# Patient Record
Sex: Female | Born: 1960 | Race: White | Hispanic: No | Marital: Married | Smoking: Former smoker
Health system: Southern US, Community
[De-identification: ages and names within clinical notes are randomized; demographics above are authoritative.]

## PROBLEM LIST (undated history)

## (undated) DIAGNOSIS — M1990 Unspecified osteoarthritis, unspecified site: Secondary | ICD-10-CM

## (undated) DIAGNOSIS — J302 Other seasonal allergic rhinitis: Secondary | ICD-10-CM

## (undated) DIAGNOSIS — J069 Acute upper respiratory infection, unspecified: Secondary | ICD-10-CM

## (undated) DIAGNOSIS — G8929 Other chronic pain: Secondary | ICD-10-CM

## (undated) DIAGNOSIS — E785 Hyperlipidemia, unspecified: Secondary | ICD-10-CM

## (undated) DIAGNOSIS — B019 Varicella without complication: Secondary | ICD-10-CM

## (undated) DIAGNOSIS — C519 Malignant neoplasm of vulva, unspecified: Secondary | ICD-10-CM

## (undated) DIAGNOSIS — I10 Essential (primary) hypertension: Secondary | ICD-10-CM

## (undated) DIAGNOSIS — Z923 Personal history of irradiation: Secondary | ICD-10-CM

## (undated) HISTORY — PX: CHOLECYSTECTOMY: SHX55

## (undated) HISTORY — PX: BLADDER SUSPENSION: SHX72

## (undated) HISTORY — DX: Varicella without complication: B01.9

## (undated) HISTORY — PX: COLONOSCOPY: SHX174

## (undated) HISTORY — PX: WISDOM TOOTH EXTRACTION: SHX21

## (undated) HISTORY — DX: Acute upper respiratory infection, unspecified: J06.9

## (undated) HISTORY — DX: Unspecified osteoarthritis, unspecified site: M19.90

---

## 2012-06-09 LAB — HM COLONOSCOPY

## 2014-12-22 LAB — HM MAMMOGRAPHY

## 2015-02-18 LAB — HEPATIC FUNCTION PANEL
ALK PHOS: 68 (ref 25–125)
ALT: 36 — AB (ref 7–35)
AST: 26 (ref 13–35)

## 2015-02-18 LAB — BASIC METABOLIC PANEL
BUN: 13 (ref 4–21)
CREATININE: 0.7 (ref 0.5–1.1)
Glucose: 95
Potassium: 4.3 (ref 3.4–5.3)
Sodium: 142 (ref 137–147)

## 2015-02-18 LAB — CBC AND DIFFERENTIAL
HEMATOCRIT: 41 (ref 36–46)
HEMOGLOBIN: 13.5 (ref 12.0–16.0)
PLATELETS: 354 (ref 150–399)
WBC: 10.2

## 2016-10-29 ENCOUNTER — Encounter: Payer: Self-pay | Admitting: Family Medicine

## 2016-10-29 ENCOUNTER — Ambulatory Visit (INDEPENDENT_AMBULATORY_CARE_PROVIDER_SITE_OTHER): Payer: 59 | Admitting: Family Medicine

## 2016-10-29 VITALS — BP 122/80 | HR 85 | Resp 12 | Ht 62.01 in | Wt 140.2 lb

## 2016-10-29 DIAGNOSIS — J309 Allergic rhinitis, unspecified: Secondary | ICD-10-CM

## 2016-10-29 DIAGNOSIS — L259 Unspecified contact dermatitis, unspecified cause: Secondary | ICD-10-CM | POA: Diagnosis not present

## 2016-10-29 DIAGNOSIS — I10 Essential (primary) hypertension: Secondary | ICD-10-CM | POA: Diagnosis not present

## 2016-10-29 MED ORDER — CLOBETASOL PROPIONATE 0.05 % EX CREA: 1.0000 "application " | TOPICAL_CREAM | Freq: Two times a day (BID) | CUTANEOUS | 0 refills | Status: AC

## 2016-10-29 MED ORDER — LOSARTAN POTASSIUM 50 MG PO TABS: 50.0000 mg | ORAL_TABLET | Freq: Every day | ORAL | 2 refills | Status: DC

## 2016-10-29 MED ORDER — FAMOTIDINE 20 MG PO TABS: 20.0000 mg | ORAL_TABLET | Freq: Two times a day (BID) | ORAL | 0 refills | Status: DC

## 2016-10-29 MED ORDER — METHYLPREDNISOLONE ACETATE 80 MG/ML IJ SUSP
40.0000 mg | Freq: Once | INTRAMUSCULAR | Status: AC
  Administered 2016-10-29: 40 mg via INTRAMUSCULAR

## 2016-10-29 NOTE — Patient Instructions (Addendum)
A few things to remember from today's visit:   Contact dermatitis, unspecified contact dermatitis type, unspecified trigger  Hypertension, essential, benign  Allergic rhinitis, unspecified seasonality, unspecified trigger   DASH Eating Plan DASH stands for "Dietary Approaches to Stop Hypertension." The DASH eating plan is a healthy eating plan that has been shown to reduce high blood pressure (hypertension). It may also reduce your risk for type 2 diabetes, heart disease, and stroke. The DASH eating plan may also help with weight loss. What are tips for following this plan? General guidelines  Avoid eating more than 2,300 mg (milligrams) of salt (sodium) a day. If you have hypertension, you may need to reduce your sodium intake to 1,500 mg a day.  Limit alcohol intake to no more than 1 drink a day for nonpregnant women and 2 drinks a day for men. One drink equals 12 oz of beer, 5 oz of wine, or 1 oz of hard liquor.  Work with your health care provider to maintain a healthy body weight or to lose weight. Ask what an ideal weight is for you.  Get at least 30 minutes of exercise that causes your heart to beat faster (aerobic exercise) most days of the week. Activities may include walking, swimming, or biking.  Work with your health care provider or diet and nutrition specialist (dietitian) to adjust your eating plan to your individual calorie needs. Reading food labels  Check food labels for the amount of sodium per serving. Choose foods with less than 5 percent of the Daily Value of sodium. Generally, foods with less than 300 mg of sodium per serving fit into this eating plan.  To find whole grains, look for the word "whole" as the first word in the ingredient list. Shopping  Buy products labeled as "low-sodium" or "no salt added."  Buy fresh foods. Avoid canned foods and premade or frozen meals. Cooking  Avoid adding salt when cooking. Use salt-free seasonings or herbs instead of  table salt or sea salt. Check with your health care provider or pharmacist before using salt substitutes.  Do not fry foods. Cook foods using healthy methods such as baking, boiling, grilling, and broiling instead.  Cook with heart-healthy oils, such as olive, canola, soybean, or sunflower oil. Meal planning   Eat a balanced diet that includes: ? 5 or more servings of fruits and vegetables each day. At each meal, try to fill half of your plate with fruits and vegetables. ? Up to 6-8 servings of whole grains each day. ? Less than 6 oz of lean meat, poultry, or fish each day. A 3-oz serving of meat is about the same size as a deck of cards. One egg equals 1 oz. ? 2 servings of low-fat dairy each day. ? A serving of nuts, seeds, or beans 5 times each week. ? Heart-healthy fats. Healthy fats called Omega-3 fatty acids are found in foods such as flaxseeds and coldwater fish, like sardines, salmon, and mackerel.  Limit how much you eat of the following: ? Canned or prepackaged foods. ? Food that is high in trans fat, such as fried foods. ? Food that is high in saturated fat, such as fatty meat. ? Sweets, desserts, sugary drinks, and other foods with added sugar. ? Full-fat dairy products.  Do not salt foods before eating.  Try to eat at least 2 vegetarian meals each week.  Eat more home-cooked food and less restaurant, buffet, and fast food.  When eating at a restaurant, ask that  your food be prepared with less salt or no salt, if possible. What foods are recommended? The items listed may not be a complete list. Talk with your dietitian about what dietary choices are best for you. Grains Whole-grain or whole-wheat bread. Whole-grain or whole-wheat pasta. Brown rice. [place]. Bulgur. Whole-grain and low-sodium cereals. Pita bread. Low-fat, low-sodium crackers. Whole-wheat flour tortillas. Vegetables Fresh or frozen vegetables (raw, steamed, roasted, or grilled). Low-sodium or  reduced-sodium tomato and vegetable juice. Low-sodium or reduced-sodium tomato sauce and tomato paste. Low-sodium or reduced-sodium canned vegetables. Fruits All fresh, dried, or frozen fruit. Canned fruit in natural juice (without added sugar). Meat and other protein foods Skinless chicken or [place]. Ground chicken or [place]. Pork with fat trimmed off. Fish and seafood. Egg whites. Dried beans, peas, or lentils. Unsalted nuts, nut butters, and seeds. Unsalted canned beans. Lean cuts of beef with fat trimmed off. Low-sodium, lean deli meat. Dairy Low-fat (1%) or fat-free (skim) milk. Fat-free, low-fat, or reduced-fat cheeses. Nonfat, low-sodium ricotta or cottage cheese. Low-fat or nonfat yogurt. Low-fat, low-sodium cheese. Fats and oils Soft margarine without trans fats. Vegetable oil. Low-fat, reduced-fat, or light mayonnaise and salad dressings (reduced-sodium). Canola, safflower, olive, soybean, and sunflower oils. Avocado. Seasoning and other foods Herbs. Spices. Seasoning mixes without salt. Unsalted popcorn and pretzels. Fat-free sweets. What foods are not recommended? The items listed may not be a complete list. Talk with your dietitian about what dietary choices are best for you. Grains Baked goods made with fat, such as croissants, muffins, or some breads. Dry pasta or rice meal packs. Vegetables Creamed or fried vegetables. Vegetables in a cheese sauce. Regular canned vegetables (not low-sodium or reduced-sodium). Regular canned tomato sauce and paste (not low-sodium or reduced-sodium). Regular tomato and vegetable juice (not low-sodium or reduced-sodium). Angie Fava. Olives. Fruits Canned fruit in a light or heavy syrup. Fried fruit. Fruit in cream or butter sauce. Meat and other protein foods Fatty cuts of meat. Ribs. Fried meat. Berniece Salines. Sausage. Bologna and other processed lunch meats. Salami. Fatback. Hotdogs. Bratwurst. Salted nuts and seeds. Canned beans with added salt. Canned or  smoked fish. Whole eggs or egg yolks. Chicken or [place] with skin. Dairy Whole or 2% milk, cream, and half-and-half. Whole or full-fat cream cheese. Whole-fat or sweetened yogurt. Full-fat cheese. Nondairy creamers. Whipped toppings. Processed cheese and cheese spreads. Fats and oils Butter. Stick margarine. Lard. Shortening. Ghee. Bacon fat. Tropical oils, such as coconut, palm kernel, or palm oil. Seasoning and other foods Salted popcorn and pretzels. Onion salt, garlic salt, seasoned salt, table salt, and sea salt. Worcestershire sauce. Tartar sauce. Barbecue sauce. Teriyaki sauce. Soy sauce, including reduced-sodium. Steak sauce. Canned and packaged gravies. Fish sauce. Oyster sauce. Cocktail sauce. Horseradish that you find on the shelf. Ketchup. Mustard. Meat flavorings and tenderizers. Bouillon cubes. Hot sauce and Tabasco sauce. Premade or packaged marinades. Premade or packaged taco seasonings. Relishes. Regular salad dressings. Where to find more information:  National Heart, Lung, and [place]: https://wilson-eaton.com/  American Heart Association: www.heart.org Summary  The DASH eating plan is a healthy eating plan that has been shown to reduce high blood pressure (hypertension). It may also reduce your risk for type 2 diabetes, heart disease, and stroke.  With the DASH eating plan, you should limit salt (sodium) intake to 2,300 mg a day. If you have hypertension, you may need to reduce your sodium intake to 1,500 mg a day.  When on the DASH eating plan, aim to eat more fresh fruits and  vegetables, whole grains, lean proteins, low-fat dairy, and heart-healthy fats.  Work with your health care provider or diet and nutrition specialist (dietitian) to adjust your eating plan to your individual calorie needs. This information is not intended to replace advice given to you by your health care provider. Make sure you discuss any questions you have with your health care provider. Document  Released: 01/24/2011 Document Revised: 01/29/2016 Document Reviewed: 01/29/2016 Elsevier Interactive Patient Education  2017 [employer]. Please be sure medication list is accurate. If a new problem present, please set up appointment sooner than planned today.

## 2016-10-29 NOTE — Progress Notes (Signed)
HPI:   Christina [name] is a 56 y.o. female, who is here today to establish care.  Former PCP: N/A,she just moved to the area 2 months ago from [place] Last preventive routine visit: Over a year ago.  Chronic medical problems: Allergic rhinitis, OA,vit D deficiency, right plantar fasciitis among some.  She lives with her husband.  She reports Hx of sinus infections, improved over the years,now she gets about one per year. Currently she is on OTC Allegra and Flonase nasal spray prn.  Allergy symptoms are well controlled at this time but she is afraid they will be bad with seasonal changes.   HTN   Dx in her 67's. Denies severe/frequent headache, visual changes, chest pain, dyspnea, palpitation, claudication, focal weakness, or edema. She is reporting lab work done within the past 2-3 months. She is currently on Cozaar 50 mg daily. She has tried to discontinue medication a few times but BP goes up again.  She follows low-salt diet and exercises regularly.  Concerns today:   Pruritic rash she noted about 2 weeks ago a day after she was working on her yard. Started on her face and now it is around her waist. She used topical steroid cream from college roomate and facial rash resolved. She left and she did not get the name of the topical cream.  She has OTC "itchy kits" but has not helped.  She is not aware of any specific exposure, she is allergic to poison sumac. She has not noted vesicular lesions.  Denies new medication,sick contact, or insect bites. No cough,wheezing,dyspnea,or chest pain.   Review of Systems  Constitutional: Negative for activity change, appetite change, fatigue and fever.  HENT: Positive for congestion, postnasal drip and rhinorrhea. Negative for ear pain, mouth sores, nosebleeds, sinus pain, sneezing, sore throat, trouble swallowing and voice change.   Eyes: Negative for discharge, redness, itching and visual disturbance.  Respiratory: Negative for  cough, shortness of breath and wheezing.   Cardiovascular: Negative for chest pain, palpitations and leg swelling.  Gastrointestinal: Negative for abdominal pain, diarrhea, nausea and vomiting.  Genitourinary: Negative for decreased urine volume and hematuria.  Musculoskeletal: Positive for arthralgias. Negative for gait problem and myalgias.  Skin: Positive for rash. Negative for wound.  Allergic/Immunologic: Positive for environmental allergies.  Neurological: Negative for syncope, weakness and headaches.  Hematological: Negative for adenopathy. Does not bruise/bleed easily.  Psychiatric/Behavioral: Negative for confusion. The patient is nervous/anxious.       No current outpatient prescriptions on file prior to visit.   No current facility-administered medications on file prior to visit.      Past Medical History:  Diagnosis Date  . Arthritis   . Chicken pox    No Known Allergies  Family History  Problem Relation Age of Onset  . Cancer Mother   . Hyperlipidemia Mother   . Hypertension Mother   . Stroke Mother   . Cancer Father   . Hypertension Maternal Grandmother   . Hypertension Paternal Grandmother   . Stroke Paternal Grandmother     Social History   Social History  . Marital status: Married    Spouse name: N/A  . Number of children: N/A  . Years of education: N/A   Social History Main Topics  . Smoking status: Former Research scientist (life sciences)  . Smokeless tobacco: Never Used  . Alcohol use Yes  . Drug use: No  . Sexual activity: Yes    Partners: Male   Other Topics Concern  .  None   Social History Narrative  . None    Vitals:   10/29/16 0759  BP: 122/80  Pulse: 85  Resp: 12  SpO2: 98%    Body mass index is 25.65 kg/m.    Physical Exam  Nursing note and vitals reviewed. Constitutional: She is oriented to person, place, and time. She appears well-developed and well-nourished. No distress.  HENT:  Head: Normocephalic and atraumatic.  Right Ear:  Hearing, tympanic membrane, external ear and ear canal normal.  Left Ear: Hearing, tympanic membrane, external ear and ear canal normal.  Nose: Septal deviation present. Right sinus exhibits no maxillary sinus tenderness and no frontal sinus tenderness. Left sinus exhibits no maxillary sinus tenderness and no frontal sinus tenderness.  Mouth/Throat: Oropharynx is clear and moist and mucous membranes are normal.  Hypertrophic turbinates.  Eyes: Pupils are equal, round, and reactive to light. Conjunctivae and EOM are normal.  Cardiovascular: Normal rate and regular rhythm.   No murmur heard. Pulses:      Dorsalis pedis pulses are 2+ on the right side, and 2+ on the left side.  Respiratory: Effort normal and breath sounds normal. No respiratory distress.  GI: Soft. She exhibits no mass. There is no hepatomegaly. There is no tenderness.  Musculoskeletal: She exhibits no edema or tenderness.  Lymphadenopathy:    She has no cervical adenopathy.  Neurological: She is alert and oriented to person, place, and time. She has normal strength. Coordination and gait normal.  Skin: Skin is warm. Ecchymosis and rash noted. Rash is urticarial. Rash is not vesicular. No erythema.     Raised erythematous rash around waist and lower abdomen, 0.6 to 1.3 cm. No tender or local heat. On some area lineal ecchymosis, scratching marks.  Psychiatric: Her mood appears anxious.  Well groomed, good eye contact.     ASSESSMENT AND PLAN:   [name] was seen today for establish care.  Diagnoses and all orders for this visit:  Contact dermatitis, unspecified contact dermatitis type, unspecified trigger  Possible etiologies discussed, distribution and characteristics of rash is not typical for poison sumac. Treatment options discussed, she would like injection here in the office, Depo Medrol 40 mg IM. Continue with topical steroid, small amount and for up to 14 days. Pepcid 20 mg bid. Instructed about warning  signs and f/u as needed.  -     clobetasol cream (TEMOVATE) 0.05 %; Apply 1 application topically 2 (two) times daily. 14 days -     famotidine (PEPCID) 20 MG tablet; Take 1 tablet (20 mg total) by mouth 2 (two) times daily. -     methylPREDNISolone acetate (DEPO-MEDROL) injection 40 mg; Inject 0.5 mLs (40 mg total) into the muscle once.  Hypertension, essential, benign  Adequately controlled. No changes in current management. DASH diet recommended. Eye exam recommended annually. F/U in 6 months, before if needed.  -     losartan (COZAAR) 50 MG tablet; Take 1 tablet (50 mg total) by mouth daily.  Allergic rhinitis, unspecified seasonality, unspecified trigger  Educated about Dx. Continue OTC Allegra 180 mg daily and Flonase nasal spray. Nasal irrigations with saline and steam inhalations may help to prevent bacterial sinusitis. F/U as needed.  -     methylPREDNISolone acetate (DEPO-MEDROL) injection 40 mg; Inject 0.5 mLs (40 mg total) into the muscle once.      Keith Cancio G. Martinique, MD  [facility]. [place] office.

## 2016-11-14 ENCOUNTER — Encounter: Payer: Self-pay | Admitting: Family Medicine

## 2016-11-15 ENCOUNTER — Encounter: Payer: Self-pay | Admitting: Family Medicine

## 2016-11-21 ENCOUNTER — Encounter: Payer: Self-pay | Admitting: Family Medicine

## 2016-11-22 ENCOUNTER — Ambulatory Visit (INDEPENDENT_AMBULATORY_CARE_PROVIDER_SITE_OTHER): Payer: 59

## 2016-11-22 DIAGNOSIS — Z23 Encounter for immunization: Secondary | ICD-10-CM | POA: Diagnosis not present

## 2016-11-24 NOTE — Progress Notes (Signed)
ACUTE VISIT   HPI:  Chief Complaint  Patient presents with  . Vaginal Discharge    [name] is a 56 y.o. female, who is here today complaining of vaginal discharge.   She is c/o 1-2 weeks of non odorous, whitish vaginal discharge.  She is reporting history of recurrent bacterial vaginosis and yeast infections in the past, former PCP treated with Metronidazole or Diflucan when she had similar symptoms in the past.   Hx of STD: Denies. Husband with Hx of hepatitis B but states that she has been screened. Sexually active: Yes Vaginal pruritus: Yes Dyspareunia: Deneis Vaginal spotting: Denies  She has not noted associated fever,chills, abdominal pain, nausea,vomiting,urinary symptoms, arthralgias, or skin rash.   She has tried OTC Monistat but has not helped.  DJS:HFWY menopausal. Last pap smear 1-2 years ago, denies Hx of abnormal pap smears.   Review of Systems  Constitutional: Negative for activity change, appetite change, fatigue and fever.  HENT: Negative for mouth sores and sore throat.   Eyes: Negative for discharge and redness.  Gastrointestinal: Negative for abdominal pain, nausea and vomiting.  Genitourinary: Positive for vaginal discharge. Negative for decreased urine volume, dyspareunia, dysuria, frequency, hematuria and vaginal bleeding.  Musculoskeletal: Negative for arthralgias, joint swelling and myalgias.  Skin: Negative for rash and wound.  Hematological: Negative for adenopathy. Does not bruise/bleed easily.      Current Outpatient Prescriptions on File Prior to Visit  Medication Sig Dispense Refill  . Cholecalciferol (VITAMIN D PO) Take 1 tablet by mouth daily.    Marland Kitchen losartan (COZAAR) 50 MG tablet Take 1 tablet (50 mg total) by mouth daily. 90 tablet 2  . Probiotic Product (PROBIOTIC PO) Take 1 tablet by mouth daily as needed.     No current facility-administered medications on file prior to visit.      Past Medical History:    Diagnosis Date  . Arthritis   . Chicken pox    No Known Allergies  Social History   Social History  . Marital status: Married    Spouse name: N/A  . Number of children: N/A  . Years of education: N/A   Social History Main Topics  . Smoking status: Former Research scientist (life sciences)  . Smokeless tobacco: Never Used  . Alcohol use Yes  . Drug use: No  . Sexual activity: Yes    Partners: Male   Other Topics Concern  . None   Social History Narrative  . None    Vitals:   11/25/16 1448  BP: 110/70  Pulse: 74  Resp: 12  SpO2: 98%   Body mass index is 26.22 kg/m.   Physical Exam  Nursing note and vitals reviewed. Constitutional: She is oriented to person, place, and time. She appears well-developed. No distress.  HENT:  Head: Normocephalic and atraumatic.  Eyes: Conjunctivae are normal.  Cardiovascular: Normal rate and regular rhythm.   Respiratory: Effort normal. No respiratory distress.  GI: Soft. She exhibits no mass. There is no tenderness. There is no CVA tenderness.  Genitourinary: There is no tenderness or lesion on the right labia. There is no tenderness or lesion on the left labia. Cervix exhibits discharge (clear.). Cervix exhibits no motion tenderness and no friability. No erythema, tenderness or bleeding in the vagina.  Genitourinary Comments: Superficial linear excoriations on vaginal introitus, related to scratching. Sample collected.   Musculoskeletal: She exhibits no edema or tenderness.  Lymphadenopathy:       Right: No inguinal adenopathy present.  Left: No inguinal adenopathy present.  Neurological: She is alert and oriented to person, place, and time. She has normal strength. Gait normal.  Skin: Skin is warm. No rash noted. No erythema.  Psychiatric: Her mood appears anxious.  Well groomed, good eye conatct.    ASSESSMENT AND PLAN:   [name] was seen today for vaginal discharge.  Diagnoses and all orders for this visit:  Vaginal discharge -     [place], YEAST, [place]; Future -     GC/Chlamydia Probe Amp  Bacterial vaginitis -     metroNIDAZOLE (FLAGYL) 500 MG tablet; Take 1 tablet (500 mg total) by mouth 2 (two) times daily.   Examination today does not suggest a serious process.  We discussed possible etiologies of vaginal discharge and pruritus.    Empiric treatment for BV started, since she has had prior similar symptoms that resolved after treatment. Side effects of Metronidazole discussed. Further recommendations will be given according to lab results.     -[name] was advised to seek immediate medical attention if sudden worsening symptoms or to follow if they persist or if new concerns arise.       [name], MD  [facility] [place] office.

## 2016-11-25 ENCOUNTER — Ambulatory Visit (INDEPENDENT_AMBULATORY_CARE_PROVIDER_SITE_OTHER): Payer: 59 | Admitting: Family Medicine

## 2016-11-25 ENCOUNTER — Encounter: Payer: Self-pay | Admitting: Family Medicine

## 2016-11-25 VITALS — BP 110/70 | HR 74 | Resp 12 | Ht 62.01 in | Wt 143.4 lb

## 2016-11-25 DIAGNOSIS — N760 Acute vaginitis: Secondary | ICD-10-CM

## 2016-11-25 DIAGNOSIS — N898 Other specified noninflammatory disorders of vagina: Secondary | ICD-10-CM

## 2016-11-25 DIAGNOSIS — B9689 Other specified bacterial agents as the cause of diseases classified elsewhere: Secondary | ICD-10-CM

## 2016-11-25 MED ORDER — METRONIDAZOLE 500 MG PO TABS: 500.0000 mg | ORAL_TABLET | Freq: Two times a day (BID) | ORAL | 0 refills | Status: AC

## 2016-11-25 NOTE — Patient Instructions (Signed)
A few things to remember from today's visit:   Vaginal discharge  Bacterial vaginitis - Plan: metroNIDAZOLE (FLAGYL) 500 MG tablet   Vaginitis Vaginitis is an inflammation of the vagina. It can happen when the normal bacteria and yeast in the vagina grow too much. There are different types. Treatment will depend on the type you have. Follow these instructions at home:  Take all medicines as told by your doctor.  Keep your vagina area clean and dry. Avoid soap. Rinse the area with water.  Avoid washing and cleaning out the vagina (douching).  Do not use tampons or have sex (intercourse) until your treatment is done.  Wipe from front to back after going to the restroom.  Wear cotton underwear.  Avoid wearing underwear while you sleep until your vaginitis is gone.  Avoid tight pants. Avoid underwear or nylons without a cotton panel.  Take off wet clothing (such as a bathing suit) as soon as you can.  Use mild, unscented products. Avoid fabric softeners and scented: ? Feminine sprays. ? Laundry detergents. ? Tampons. ? Soaps or bubble baths.  Practice safe sex and use condoms. Get help right away if:  You have belly (abdominal) pain.  You have a fever or lasting symptoms for more than 2-3 days.  You have a fever and your symptoms suddenly get worse. This information is not intended to replace advice given to you by your health care provider. Make sure you discuss any questions you have with your health care provider. Document Released: 05/03/2008 Document Revised: 07/13/2015 Document Reviewed: 07/18/2011 Elsevier Interactive Patient Education  2017 [place].  Please be sure medication list is accurate. If a new problem present, please set up appointment sooner than planned today.

## 2016-11-27 ENCOUNTER — Encounter: Payer: Self-pay | Admitting: Family Medicine

## 2016-11-27 LAB — WET PREP FOR TRICH, YEAST, CLUE

## 2016-11-27 LAB — GC/CHLAMYDIA PROBE AMP
Chlamydia trachomatis, NAA: NEGATIVE
Neisseria gonorrhoeae by PCR: NEGATIVE

## 2016-11-27 LAB — SPECIMEN STATUS REPORT

## 2016-11-27 LAB — VAGINITIS/VAGINOSIS, DNA PROBE
CANDIDA SPECIES: NEGATIVE
GARDNERELLA VAGINALIS: NEGATIVE
TRICHOMONAS VAG: NEGATIVE

## 2016-12-13 ENCOUNTER — Encounter: Payer: Self-pay | Admitting: Family Medicine

## 2016-12-13 ENCOUNTER — Telehealth: Payer: Self-pay | Admitting: Family Medicine

## 2016-12-13 ENCOUNTER — Ambulatory Visit (INDEPENDENT_AMBULATORY_CARE_PROVIDER_SITE_OTHER): Payer: 59 | Admitting: Internal Medicine

## 2016-12-13 ENCOUNTER — Encounter: Payer: Self-pay | Admitting: Internal Medicine

## 2016-12-13 VITALS — BP 152/98 | HR 77 | Temp 97.6°F | Wt 139.8 lb

## 2016-12-13 DIAGNOSIS — Z87440 Personal history of urinary (tract) infections: Secondary | ICD-10-CM | POA: Diagnosis not present

## 2016-12-13 DIAGNOSIS — N390 Urinary tract infection, site not specified: Secondary | ICD-10-CM

## 2016-12-13 DIAGNOSIS — R300 Dysuria: Secondary | ICD-10-CM

## 2016-12-13 DIAGNOSIS — R1031 Right lower quadrant pain: Secondary | ICD-10-CM | POA: Diagnosis not present

## 2016-12-13 LAB — POCT URINALYSIS DIPSTICK
BILIRUBIN UA: NEGATIVE
Glucose, UA: NEGATIVE
KETONES UA: NEGATIVE
PROTEIN UA: NEGATIVE
SPEC GRAV UA: 1.015 (ref 1.010–1.025)
Urobilinogen, UA: 1 E.U./dL
pH, UA: 6 (ref 5.0–8.0)

## 2016-12-13 MED ORDER — SULFAMETHOXAZOLE-TRIMETHOPRIM 800-160 MG PO TABS: 1.0000 | ORAL_TABLET | Freq: Two times a day (BID) | ORAL | 0 refills | Status: DC

## 2016-12-13 NOTE — Telephone Encounter (Signed)
Ok with me 

## 2016-12-13 NOTE — Progress Notes (Signed)
Chief Complaint  Patient presents with  . Dysuria    burning with urination x 1 day. Last night patient experienced RLQ pain and nausea. Tender to touch. Notes some diarrhea 1 day ago.     HPI: Christina [name] 56 y.o.  sda  PCP NA    Onset of above dysuria for a day and then developed some right lower quadrant discomfort without associated vomiting or anorexia fever may be mild chills that are now gone. This feels like a typical UTI except for that.  Recently treated for BV with a negative testing had taken only a few days of metronidazole that may have helped her vaginal symptoms which she thinks are gone she started taking the metronidazole and Pyridium type medicine when she got these new symptoms for 2 days. Has hx of utis and   1-2 per year.   [facility]  Had urology   [place]. Doing hot yoga   And trying to tlose weight.  ROS: See pertinent positives and negatives per HPI.   Past Medical History:  Diagnosis Date  . Arthritis   . Chicken pox     Family History  Problem Relation Age of Onset  . Cancer Mother   . Hyperlipidemia Mother   . Hypertension Mother   . Stroke Mother   . Cancer Father   . Hypertension Maternal Grandmother   . Hypertension Paternal Grandmother   . Stroke Paternal Grandmother     Social History   Social History  . Marital status: Married    Spouse name: N/A  . Number of children: N/A  . Years of education: N/A   Social History Main Topics  . Smoking status: Former Research scientist (life sciences)  . Smokeless tobacco: Never Used  . Alcohol use Yes  . Drug use: No  . Sexual activity: Yes    Partners: Male   Other Topics Concern  . None   Social History Narrative  . None    Outpatient Medications Prior to Visit  Medication Sig Dispense Refill  . Cholecalciferol (VITAMIN D PO) Take 1 tablet by mouth daily.    Marland Kitchen losartan (COZAAR) 50 MG tablet Take 1 tablet (50 mg total) by mouth daily. 90 tablet 2  . Probiotic Product (PROBIOTIC PO) Take 1 tablet by mouth daily as  needed.     No facility-administered medications prior to visit.      EXAM:  BP (!) 152/98 (BP Location: Left Arm, Patient Position: Sitting, Cuff Size: Normal)   Pulse 77   Temp 97.6 F (36.4 C) (Oral)   Wt 139 lb 12.8 oz (63.4 kg)   BMI 25.56 kg/m   Body mass index is 25.56 kg/m.  GENERAL: vitals reviewed and listed above, alert, oriented, appears well hydrated and in no acute distress HEENT: atraumatic, conjunctiva  clear, no obvious abnormalities on inspection of external nose and ears  NECK: no obvious masses on inspection palpation  LUNGS: clear to auscultation bilaterally, no wheezes, rales or rhonchi, good air movement CV: HRRR, no clubbing cyanosis or  peripheral edema nl cap refill  Abdomen soft normal bowel sounds no guarding or rebound some tenderness in the deep right lower quadrant no masses.  No flank pain.  MS: moves all extremities without noticeable focal  abnormality PSYCH: pleasant and cooperative, no obvious depression or anxiety UA is positive for blood white cells and nitrites ASSESSMENT AND PLAN:  Discussed the following assessment and plan:  Dysuria - Plan: POC Urinalysis Dipstick, Urine Culture  Acute UTI - Plan:  Urine Culture  History of recurrent UTIs  Abdominal discomfort in right lower quadrant  -Patient advised to return or notify health care team  if symptoms worsen ,persist or new concerns arise.   Expectant management. Seek care for alarm sx over weekend No need to take metronidazole at this time [facility]  Records in record  Initial review   200 pages  didn't find urology notes  Yet.  Patient Instructions  This is most   Likely   a uti  Alone    doubt if the  abd pain is  From appendicitis and  .....  If getting worse then contact  Medical team .         Standley Brooking. Panosh M.D.

## 2016-12-13 NOTE — Telephone Encounter (Signed)
Pt states Dr Regis Bill has agreed to allow her to transfer from Dr Martinique after her visit with her today. Is that ok with you Dr Regis Bill?  [place] with you Dr Martinique?

## 2016-12-13 NOTE — Patient Instructions (Addendum)
This is most   Likely   a uti  Alone    doubt if the  abd pain is  From appendicitis and  .....  If getting worse then contact  Medical team .

## 2016-12-14 LAB — URINE CULTURE
MICRO NUMBER: 81202665
SPECIMEN QUALITY:: ADEQUATE

## 2016-12-15 NOTE — Telephone Encounter (Signed)
Fine with me

## 2016-12-16 NOTE — Progress Notes (Signed)
Tell pt urine culture showed multiple bacteria but not predominant germ. Cannot tell if she has  UTI. No predominant  Bacteria  Identifired      Take antibiotic    medication and if ongoing get a better  Clean catch for culture

## 2016-12-17 NOTE — Telephone Encounter (Signed)
Left message on VM to call back

## 2016-12-17 NOTE — Telephone Encounter (Signed)
Pt aware ok to switch to Dr Regis Bill. Will call back to make an appt

## 2016-12-18 ENCOUNTER — Encounter: Payer: Self-pay | Admitting: Internal Medicine

## 2016-12-18 NOTE — Telephone Encounter (Signed)
Discussed results via telephone. Nothing further needed.

## 2017-01-20 ENCOUNTER — Encounter: Payer: Self-pay | Admitting: Internal Medicine

## 2017-01-21 ENCOUNTER — Encounter: Payer: Self-pay | Admitting: Internal Medicine

## 2017-01-21 ENCOUNTER — Ambulatory Visit: Payer: 59 | Admitting: Internal Medicine

## 2017-01-21 ENCOUNTER — Telehealth: Payer: Self-pay | Admitting: Family Medicine

## 2017-01-21 VITALS — BP 106/78 | HR 81 | Temp 97.5°F | Wt 141.5 lb

## 2017-01-21 DIAGNOSIS — R32 Unspecified urinary incontinence: Secondary | ICD-10-CM | POA: Diagnosis not present

## 2017-01-21 DIAGNOSIS — R300 Dysuria: Secondary | ICD-10-CM

## 2017-01-21 DIAGNOSIS — N390 Urinary tract infection, site not specified: Secondary | ICD-10-CM | POA: Diagnosis not present

## 2017-01-21 LAB — POCT URINALYSIS DIPSTICK
Bilirubin, UA: NEGATIVE
GLUCOSE UA: NEGATIVE
Ketones, UA: NEGATIVE
Nitrite, UA: NEGATIVE
SPEC GRAV UA: 1.02 (ref 1.010–1.025)
UROBILINOGEN UA: 0.2 U/dL
pH, UA: 6.5 (ref 5.0–8.0)

## 2017-01-21 MED ORDER — NITROFURANTOIN MONOHYD MACRO 100 MG PO CAPS: 100.0000 mg | ORAL_CAPSULE | Freq: Two times a day (BID) | ORAL | 0 refills | Status: AC

## 2017-01-21 NOTE — Telephone Encounter (Signed)
Pt called again with UTI symptoms.  Do you want to just get a urine sample/culture or have the patient schedule an OV before you go out on vacation?   I am trying to figure out when these symptoms come.It was a busy weekend. . . I was out a lot and drinking wine and had to hold it several times. When I hold it, I tend to "strain" and then 1 -2days later this happens. It feels like a UTI, however, when I have come in I think the tests show it is not. However, the drugs given to me clear it up right away? ! I am taking Azo for pain relief of the burning.  Pt has additional questions about colon screening, due for Colonoscopy -- wants to discuss.

## 2017-01-21 NOTE — Progress Notes (Signed)
Chief Complaint  Patient presents with  . Recurrent UTI    Urinary frequency, burning and feeling bad. No energy. Denies lower back pain or abd pain. Azo x 1 day.     HPI: Christina [name] 56 y.o.  PCP NA   rx for uit in October nov  But uc mult specise  Got better with antibiotic and sx again     3rd time since move.  No fever flank pain   Last episode resolved quickly with antibiotic   ROS: See pertinent positives and negatives per HPI.  Past Medical History:  Diagnosis Date  . Arthritis   . Chicken pox     Family History  Problem Relation Age of Onset  . Cancer Mother   . Hyperlipidemia Mother   . Hypertension Mother   . Stroke Mother   . Cancer Father   . Hypertension Maternal Grandmother   . Hypertension Paternal Grandmother   . Stroke Paternal Grandmother     Social History   Socioeconomic History  . Marital status: Married    Spouse name: None  . Number of children: None  . Years of education: None  . Highest education level: None  Social Needs  . Financial resource strain: None  . Food insecurity - worry: None  . Food insecurity - inability: None  . Transportation needs - medical: None  . Transportation needs - non-medical: None  Occupational History  . None  Tobacco Use  . Smoking status: Former Research scientist (life sciences)  . Smokeless tobacco: Never Used  Substance and Sexual Activity  . Alcohol use: Yes  . Drug use: No  . Sexual activity: Yes    Partners: Male  Other Topics Concern  . None  Social History Narrative  . None    Outpatient Medications Prior to Visit  Medication Sig Dispense Refill  . Cholecalciferol (VITAMIN D PO) Take 1 tablet by mouth daily.    Marland Kitchen losartan (COZAAR) 50 MG tablet Take 1 tablet (50 mg total) by mouth daily. 90 tablet 2  . metroNIDAZOLE (FLAGYL) 500 MG tablet Take 500 mg by mouth 2 (two) times daily.    . Probiotic Product (PROBIOTIC PO) Take 1 tablet by mouth daily as needed.    . Phenazopyridine HCl (AZO TABS PO) Take by mouth as  needed.    . sulfamethoxazole-trimethoprim (BACTRIM DS,SEPTRA DS) 800-160 MG tablet Take 1 tablet by mouth 2 (two) times daily. (Patient not taking: Reported on 01/21/2017) 10 tablet 0   No facility-administered medications prior to visit.      EXAM:  BP 106/78 (BP Location: Left Arm, Patient Position: Sitting, Cuff Size: Normal)   Pulse 81   Temp (!) 97.5 F (36.4 C) (Oral)   Wt 141 lb 8 oz (64.2 kg)   BMI 25.87 kg/m   Body mass index is 25.87 kg/m.  GENERAL: vitals reviewed and listed above, alert, oriented, appears well hydrated and in no acute distress HEENT: atraumatic, conjunctiva  clear, no obvious abnormalities on inspection of external nose and ears Abdomen:  Sof,t normal bowel sounds without hepatosplenomegaly, no guarding rebound or masses no CVA tenderness MS: moves all extremities without noticeable focal  abnormality PSYCH: pleasant and cooperative, no obvious depression or anxiety ua pos leuk and blood  ASSESSMENT AND PLAN:  Discussed the following assessment and plan:  Dysuria - Plan: POC Urinalysis Dipstick, Ambulatory referral to Urology, Urine Culture, CANCELED: Urine Culture, CANCELED: Urine Culture, CANCELED: Urine Culture  Recurrent UTI - Plan: Ambulatory referral to Urology  Urinary incontinence, unspecified type - Plan: Ambulatory referral to Urology probably uti again  Culture and   Disc uro referral    Jan and after  -Patient advised to return or notify health care team  if symptoms worsen ,persist or new concerns arise.  Patient Instructions  Will reculture and go back on antibiotic   .  Let us know  If not getting better .  Will put in urology referral .     Standley Brooking. Janyra Barillas M.D.

## 2017-01-21 NOTE — Patient Instructions (Addendum)
Will reculture and go back on antibiotic   .  Let us know  If not getting better .  Will put in urology referral .

## 2017-01-21 NOTE — Telephone Encounter (Signed)
Copied from [place] [id number]. Topic: Referral - Request >> Jan 21, 2017  2:08 PM Hewitt Shorts wrote: Reason for CRM: pt is wanting to know if dr Martinique is wanting to know if she needs a colonoscopy  Best number

## 2017-01-21 NOTE — Telephone Encounter (Signed)
Ov today since dr Martinique not in office

## 2017-01-21 NOTE — Telephone Encounter (Signed)
OV scheduled for 345 today with Dr Regis Bill.  Dr Martinique is not in office this week.  Nothing further needed.

## 2017-01-22 NOTE — Telephone Encounter (Signed)
Called patient and left message to return call. Per chart, she is due for colonoscopy, but need to know if she has ever had one before and then I can forward to Dr. Martinique.  Dr. Martinique is out of the office this week, but will follow-up next week.

## 2017-01-24 NOTE — Telephone Encounter (Signed)
Pt states yes she had colonoscopy around age 56 yrs, and was supposed to come back before 10 yrs.  Pt has switched to Dr Regis Bill (phone note) so can be forwarded to Dr Regis Bill. Pt aware Dr Regis Bill is out until 12/24 and ok with that.  Pt states we should have those records.

## 2017-01-24 NOTE — Telephone Encounter (Signed)
Called patient and left message to return call

## 2017-01-25 LAB — URINE CULTURE

## 2017-01-29 NOTE — Telephone Encounter (Signed)
I do not see a colon report in the 169 pages  I scanned .  But seems like had one in 2014 ?  Do not see the actual report  So cannot comment .   unless others can find the report in her PCP records   she should contact her  Previous GI and get copy of colonoscopy report . I am out of office  At this time

## 2017-01-29 NOTE — Progress Notes (Signed)
Tell patient that urine culture shows bacteria   sensitive to medication given . Should resolve with current treatment

## 2017-01-29 NOTE — Telephone Encounter (Signed)
LM for patient to call back.

## 2017-02-13 NOTE — Telephone Encounter (Signed)
LM to make patient aware of information below per Dr Regis Bill.  Mychart message sent.  This encounter has already been closed.

## 2017-02-18 ENCOUNTER — Other Ambulatory Visit: Payer: Self-pay

## 2017-02-18 ENCOUNTER — Encounter (HOSPITAL_COMMUNITY): Payer: Self-pay | Admitting: *Deleted

## 2017-02-18 ENCOUNTER — Ambulatory Visit (HOSPITAL_COMMUNITY)
Admission: EM | Admit: 2017-02-18 | Discharge: 2017-02-18 | Disposition: A | Discharge status: Home / Self Care | Payer: 59 | Attending: Family Medicine | Admitting: Family Medicine

## 2017-02-18 ENCOUNTER — Encounter: Payer: Self-pay | Admitting: Internal Medicine

## 2017-02-18 DIAGNOSIS — N3000 Acute cystitis without hematuria: Secondary | ICD-10-CM

## 2017-02-18 MED ORDER — NITROFURANTOIN MONOHYD MACRO 100 MG PO CAPS: 100.0000 mg | ORAL_CAPSULE | Freq: Two times a day (BID) | ORAL | 0 refills | Status: DC

## 2017-02-18 NOTE — ED Triage Notes (Signed)
Per pt she is having Enterococcus faecalis. Per pt she is nauseous, per pt she have to urinate a lot, per pt she just needs antibiotic

## 2017-02-19 ENCOUNTER — Encounter: Payer: Self-pay | Admitting: Internal Medicine

## 2017-02-19 NOTE — Telephone Encounter (Signed)
See open mychart encounter

## 2017-02-19 NOTE — Telephone Encounter (Signed)
Flagyl is usually for a vaginal infection   But can send in diflucan 150 mg  X 1 for possible yeast infection  asthyn please send this in if she is having a yeast infection.

## 2017-02-19 NOTE — ED Provider Notes (Signed)
  [name]    ASSESSMENT & PLAN:  1. Acute cystitis without hematuria     Meds ordered this encounter  Medications  . nitrofurantoin, macrocrystal-monohydrate, (MACROBID) 100 MG capsule    Sig: Take 1 capsule (100 mg total) by mouth 2 (two) times daily.    Dispense:  14 capsule    Refill:  0   Treatment based on urine culture she brings dated 01/29/2017. Does not desire U/A or culture tonight. Will follow up with her PCP or here if not showing improvement over the next 48 hours, sooner if needed.  Outlined signs and symptoms indicating need for more acute intervention. Patient verbalized understanding. After Visit Summary given.  SUBJECTIVE:  Christina [name] is a 57 y.o. female who complains of urinary frequency, urgency and dysuria for the past 1-2 days. No flank pain, fever, chills, abnormal vaginal discharge or bleeding. Hematuria: not present.  Normal PO intake. No flank or abdominal pain. No self treatment. Ambulatory without difficulty.  LMP: No LMP recorded. Patient is not currently having periods (Reason: Other).  ROS: As in HPI.  OBJECTIVE:  Vitals:   02/18/17 1745  BP: (!) 166/87  Pulse: 76  Temp: 97.7 F (36.5 C)  SpO2: 100%   Appears well, in no apparent distress. Abdomen is soft without tenderness, guarding, mass, rebound or organomegaly. No CVA tenderness or inguinal adenopathy noted.  No Known Allergies  Past Medical History:  Diagnosis Date  . Arthritis   . Chicken pox    Social History   Socioeconomic History  . Marital status: Married    Spouse name: Not on file  . Number of children: Not on file  . Years of education: Not on file  . Highest education level: Not on file  Social Needs  . Financial resource strain: Not on file  . Food insecurity - worry: Not on file  . Food insecurity - inability: Not on file  . Transportation needs - medical: Not on file  . Transportation needs - non-medical: Not on file  Occupational History  .  Not on file  Tobacco Use  . Smoking status: Former Research scientist (life sciences)  . Smokeless tobacco: Never Used  Substance and Sexual Activity  . Alcohol use: Yes  . Drug use: No  . Sexual activity: Yes    Partners: Male  Other Topics Concern  . Not on file  Social History Narrative  . Not on file   Family History  Problem Relation Age of Onset  . Cancer Mother   . Hyperlipidemia Mother   . Hypertension Mother   . Stroke Mother   . Cancer Father   . Hypertension Maternal Grandmother   . Hypertension Paternal Grandmother   . Stroke Paternal Izetta Dakin, MD 02/19/17 [phone]

## 2017-02-19 NOTE — Telephone Encounter (Addendum)
Please advise Dr Regis Bill, thanks.   See e-mail and message below: Dr. Regis Bill,     My symptoms are back. I just took an azo @ 3 pm on NEw Year's day. I was traveling a lot over the holidays.     I am wondering if I can get a refill on whatever I need or if I need to come in and see you today.     Christina [name]

## 2017-02-21 MED ORDER — FLUCONAZOLE 150 MG PO TABS: 150.0000 mg | ORAL_TABLET | Freq: Every day | ORAL | 0 refills | Status: DC

## 2017-03-11 ENCOUNTER — Encounter: Payer: Self-pay | Admitting: Internal Medicine

## 2017-04-03 ENCOUNTER — Encounter (HOSPITAL_COMMUNITY): Payer: Self-pay | Admitting: Emergency Medicine

## 2017-04-03 ENCOUNTER — Other Ambulatory Visit: Payer: Self-pay

## 2017-04-03 ENCOUNTER — Ambulatory Visit (HOSPITAL_COMMUNITY)
Admission: EM | Admit: 2017-04-03 | Discharge: 2017-04-03 | Disposition: A | Discharge status: Home / Self Care | Payer: 59 | Attending: Internal Medicine | Admitting: Internal Medicine

## 2017-04-03 DIAGNOSIS — J029 Acute pharyngitis, unspecified: Secondary | ICD-10-CM | POA: Insufficient documentation

## 2017-04-03 DIAGNOSIS — B349 Viral infection, unspecified: Secondary | ICD-10-CM | POA: Diagnosis not present

## 2017-04-03 LAB — POCT RAPID STREP A: Streptococcus, Group A Screen (Direct): NEGATIVE

## 2017-04-03 NOTE — ED Provider Notes (Signed)
[facility]    CSN: [id number] Arrival date & time: 04/03/17  1819     History   Chief Complaint Chief Complaint  Patient presents with  . Sore Throat    HPI Jalea Hemler is a 57 y.o. female.   57 year old female comes in for 3-day history of sore throat. States pain with swallowing. Mild rhinorrhea. Denies ear pain. Denies fever, chills, night sweats. Denies cough.  Dysphagia without swelling of the throat, shortness of breath, drooling, tripoding.  She also complains of a generalized rash that she thinks may be related to a bladder medication she started.  She does not recall the medication, but has since stopped taking it for 2-3 weeks.  Rash is still  pruritic, and continues to scratch.       Past Medical History:  Diagnosis Date  . Arthritis   . Chicken pox     Patient Active Problem List   Diagnosis Date Noted  . 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 Allergic rhinitis 10/29/2016    Past Surgical History:  Procedure Laterality Date  . CHOLECYSTECTOMY      OB History    No data available       Home Medications    Prior to Admission medications   Medication Sig Start Date End Date Taking? Authorizing Provider  losartan (COZAAR) 50 MG tablet Take 1 tablet (50 mg total) by mouth daily. 10/29/16  Yes Martinique, Betty G, MD  Cholecalciferol (VITAMIN D PO) Take 1 tablet by mouth daily.    [provider]  Probiotic Product (PROBIOTIC PO) Take 1 tablet by mouth daily as needed.    [provider]    Family History Family History  Problem Relation Age of Onset  . Cancer Mother   . Hyperlipidemia Mother   . Hypertension Mother   . Stroke Mother   . Cancer Father   . Hypertension Maternal Grandmother   . Hypertension Paternal Grandmother   . Stroke Paternal Grandmother     Social History Social History   Tobacco Use  . Smoking status: Former Research scientist (life sciences)  . Smokeless tobacco: Never Used  Substance Use Topics  . Alcohol use:  Yes  . Drug use: No     Allergies   Patient has no known allergies.   Review of Systems Review of Systems  Reason unable to perform ROS: See HPI as above.     Physical Exam Triage Vital Signs ED Triage Vitals  Enc Vitals Group     BP 04/03/17 1921 (!) 146/95     Pulse Rate 04/03/17 1921 97     Resp 04/03/17 1921 20     Temp 04/03/17 1921 99.3 F (37.4 C)     Temp Source 04/03/17 1921 Oral     SpO2 04/03/17 1921 99 %     Weight --      Height --      Head Circumference --      Peak Flow --      Pain Score 04/03/17 1919 8     Pain Loc --      Pain Edu? --      Excl. in [place]? --    No data found.  Updated Vital Signs BP (!) 146/95 (BP Location: Right Arm)   Pulse 97   Temp 99.3 F (37.4 C) (Oral)   Resp 20   SpO2 99%   Physical Exam  Constitutional: She is oriented to person, place, and time. She appears well-developed and well-nourished. No  distress.  HENT:  Head: Normocephalic and atraumatic.  Right Ear: Tympanic membrane, external ear and ear canal normal. Tympanic membrane is not erythematous and not bulging.  Left Ear: Tympanic membrane, external ear and ear canal normal. Tympanic membrane is not erythematous and not bulging.  Nose: Mucosal edema and rhinorrhea present. Right sinus exhibits maxillary sinus tenderness and frontal sinus tenderness. Left sinus exhibits maxillary sinus tenderness and frontal sinus tenderness.  Mouth/Throat: Uvula is midline and mucous membranes are normal. Posterior oropharyngeal erythema present. No posterior oropharyngeal edema. No tonsillar exudate.  Eyes: Conjunctivae are normal. Pupils are equal, round, and reactive to light.  Neck: Normal range of motion. Neck supple.  Cardiovascular: Normal rate, regular rhythm and normal heart sounds. Exam reveals no gallop and no friction rub.  No murmur heard. Pulmonary/Chest: Effort normal and breath sounds normal. She has no decreased breath sounds. She has no wheezes. She has no  rhonchi. She has no rales.  Lymphadenopathy:    She has no cervical adenopathy.  Neurological: She is alert and oriented to person, place, and time.  Skin: Skin is warm and dry.  Scattered maculopapular rash with scratch wounds.   Psychiatric: She has a normal mood and affect. Her behavior is normal. Judgment normal.   UC Treatments / Results  Labs (all labs ordered are listed, but only abnormal results are displayed) Labs Reviewed  CULTURE, GROUP A STREP [facility])  POCT RAPID STREP A    EKG  EKG Interpretation None       Radiology No results found.  Procedures Procedures (including critical care time)  Medications Ordered in UC Medications - No data to display   Initial Impression / Assessment and Plan / UC Course  I have reviewed the triage vital signs and the nursing notes.  Pertinent labs & imaging results that were available during my care of the patient were reviewed by me and considered in my medical decision making (see chart for details).    Discussed history and exam most consistent with viral illness.  Patient became upset, crying, stating "you are not going to give me an antibiotic?"  Explained to patient given 3-day history of symptoms, less likely to be bacterial infection, and antibiotic is not currently indicated.  Patient states "Fine, it has been going on for 10 days." Patient visibly upset, stating "you don't know what happens to me, if I don't get antibiotics, this will turn into bronchitis." Discussed symptomatic treatment can help decrease chances of viral illness from turning into bacterial infection. Patient states "just get me out of here". Patient left in stable condition without waiting for discharge paperwork or prescriptions.   Final Clinical Impressions(s) / UC Diagnoses   Final diagnoses:  Viral illness    ED Discharge Orders    None        Arturo Morton 04/03/17 2143

## 2017-04-03 NOTE — ED Triage Notes (Signed)
Onset 3 days ago of sore throat.  Throat hurts in the evening, not the morning.  Denies runny nose or stuffy nose.  "its really just the throat.  " Patient has a generalized rash, thought to be related to a bladder drug.   Patient was on a new bladder drug, stopped taking it and now trying to get rash to resolve.

## 2017-04-04 ENCOUNTER — Encounter: Payer: Self-pay | Admitting: Internal Medicine

## 2017-04-04 NOTE — Telephone Encounter (Signed)
Please advise Dr Panosh, thanks.   

## 2017-04-06 LAB — CULTURE, GROUP A STREP (THRC)

## 2017-04-09 ENCOUNTER — Encounter: Payer: Self-pay | Admitting: Internal Medicine

## 2017-04-09 NOTE — Telephone Encounter (Signed)
Already answered  

## 2017-04-09 NOTE — Telephone Encounter (Signed)
------   Christina [name],   I went to urgent care last night, thought I had strep throat. I could not swallow it hurt so bad. Strep test was negative and she diagnosed me with a sinus infection. Would not give me anything. I had a fever,blood pressure 149/95. I cried. I also have a rash I think is from a drug Dr. Louis Meckel has me on. It's been 3 weeks, I stopped taking the Myrtrec and when I think the rash is almost gone it's not. The PA told me to stop scratching and since I only had symptoms for 4 days she could do nothing for me.    I need you to know my symptoms, I get "deep-rooted" sinus infections. I cannot sleep and if not treated it turns into bronchitis. I've been able to manage them down to one a year. I've been sick a lot since we've moved here and it has frustrated me to no end. I'm usually very healthy.  I found augmentin and dexamethasone in my cabinet at home. All good today and am able to work. Wanted you to know.    Christina [name]  ------  Pt still has rash and it is not improving.   Please advise Dr Regis Bill, thanks.

## 2017-04-09 NOTE — Telephone Encounter (Signed)
Cant tell without a visit to check  Take pix of rash   Make sure no new medications   Or document if such  Most rashes resolve in a week or so     Need OV   If I am here tomorrow I will see her  Or Friday  Make sure no fever

## 2017-04-10 NOTE — Progress Notes (Signed)
Chief Complaint  Patient presents with  . Rash    Rash started in joints then spread to rest of extremity but will only be on one area at a time. Currently rash is abdomen and buttocks.   . Vaginitis    Pt thinks that she may have a yeast infection.     HPI: Christina [name] 57 y.o.  comesin today  For acute   problem based  appt with rash for 2-3 weeks  Itching and of uncertain cause   . She    Placed on [facility] urologist Dr Louis Meckel  And had     Mild rash  Arms    And then  Stopped med in case a cause    Now off med for 2-3 weeks    And still spreading . Super itching    Was on arms and now upper and buttocks and proximal  Thighs  Not on face palms  Axilla  Or interdigital  Has tried  aveeno   Moisturizing .   Benadryl.   Sinus infection       Hurt to swallow.  Evaluation  at  [facility]  . Felt to be  Viral and no antibiotic given  Last antibiotic jan early for uti from urology    No fever rash from illness   Has  Vaginal irritation dc not sure    Last pap when in dc  She tends to get either bv or  Yeast and cannot tell  Which by sx .   Marland Kitchen      ROS: See pertinent positives and negatives per HPI. No fever cp sib joint swelling   Husband  Or contacts without rash  .    No known exposures   Past Medical History:  Diagnosis Date  . Arthritis   . Chicken pox     Family History  Problem Relation Age of Onset  . Cancer Mother   . Hyperlipidemia Mother   . Hypertension Mother   . Stroke Mother   . Cancer Father   . Hypertension Maternal Grandmother   . Hypertension Paternal Grandmother   . Stroke Paternal Grandmother     Social History   Socioeconomic History  . Marital status: Married    Spouse name: None  . Number of children: None  . Years of education: None  . Highest education level: None  Social Needs  . Financial resource strain: None  . Food insecurity - worry: None  . Food insecurity - inability: None  . Transportation needs - medical: None  . Transportation needs -  non-medical: None  Occupational History  . None  Tobacco Use  . Smoking status: Former Research scientist (life sciences)  . Smokeless tobacco: Never Used  Substance and Sexual Activity  . Alcohol use: Yes  . Drug use: No  . Sexual activity: Yes    Partners: Male  Other Topics Concern  . None  Social History Narrative  . None    Outpatient Medications Prior to Visit  Medication Sig Dispense Refill  . Cholecalciferol (VITAMIN D PO) Take 1 tablet by mouth daily.    Marland Kitchen losartan (COZAAR) 50 MG tablet Take 1 tablet (50 mg total) by mouth daily. 90 tablet 2  . Probiotic Product (PROBIOTIC PO) Take 1 tablet by mouth daily as needed.     No facility-administered medications prior to visit.      EXAM:  BP 122/80 (BP Location: Right Arm, Patient Position: Sitting, Cuff Size: Normal)   Pulse  71   Temp (!) 97.3 F (36.3 C) (Oral)   Wt 141 lb 11.2 oz (64.3 kg)   BMI 25.91 kg/m   Body mass index is 25.91 kg/m.  GENERAL: vitals reviewed and listed above, alert, oriented, appears well hydrated and in no acute distress HEENT: atraumatic, conjunctiva  clear, no obvious abnormalities on inspection of external nose and ears  NECK: no obvious masses on inspection palpation  LUNGS: clear to auscultation bilaterally, no wheezes, rales or rhonchi, good air movement CV: HRRR, no clubbing cyanosis or  peripheral edema nl cap refill  Abdomen:  Sof,t normal bowel sounds without hepatosplenomegaly, no guarding rebound or masses no CVA tenderness Skin :  Excoriated areas on forearms  Right upper arm  With papular urticarial type  Rash  Trunk anterior clear   lower back and buttocks with  ? Follicular   Rash  = dist   And red area anterior thigh symmetrical  No burrows  No vesicles or  Blisters  palms  Axilla clear  MS: moves all extremities without noticeable focal  abnormality PSYCH: pleasant and cooperative, no obvious depression or anxiety Pelvic: NL ext GU, labia clear without lesions or rash . Vagina ext  Posterior is  red  Demarcated area no julcers   no lesions .Cervix: clear  UTERUS: Neg CMT Adnexa:  clear no masses . PAP done hr hpv done as well as   t bv y  Lab Results  Component Value Date   WBC 10.2 02/18/2015   HGB 13.5 02/18/2015   HCT 41 02/18/2015   PLT 354 02/18/2015   ALT 36 (A) 02/18/2015   AST 26 02/18/2015   NA 142 02/18/2015   K 4.3 02/18/2015   CREATININE 0.7 02/18/2015   BUN 13 02/18/2015    ASSESSMENT AND PLAN:  Discussed the following assessment and plan:  Pruritic rash  Routine Papanicolaou smear - Plan: PAP [[place]]  Acute vaginitis - Plan: PAP [[place]]  History of recurrent UTIs  Urinary incontinence, unspecified type - getting physical therapy see text  about myrbetric Itchy rash  Uncertain etiology    Doesn't   Seem like scabies  At this time  And no others with rash    2-3 weeks after  Stopping med   Empiric rx   Stop hot yoga for now   Until better   And  Atarax zyrtec and pred for now  Fu if    persistent or progressive local moisturizing  Total visit 57mins > 50% spent counseling and coordinating care as indicated in above note and in instructions to patient .   -Patient advised to return or notify health care team  if symptoms worsen ,persist or new concerns arise.  Patient Instructions  Checking for yeast and BV.   Uncertain cause of rash biut certainly seems allergic   In nature  . Usually medication rashes resolve fairly quickly .   Can try  Atarax   At night  Or  Add on every 8 hours   For itching   And  Zyrtec  In days   Trial of prednisone although his doesn't  Tell us what the cause of the rash is.    Standley Brooking. Crandall Harvel M.D.

## 2017-04-11 ENCOUNTER — Ambulatory Visit: Payer: 59 | Admitting: Internal Medicine

## 2017-04-11 ENCOUNTER — Encounter: Payer: Self-pay | Admitting: Internal Medicine

## 2017-04-11 ENCOUNTER — Other Ambulatory Visit (HOSPITAL_COMMUNITY)
Admission: RE | Admit: 2017-04-11 | Discharge: 2017-04-11 | Disposition: A | Discharge status: Home / Self Care | Payer: 59 | Source: Ambulatory Visit | Attending: Internal Medicine | Admitting: Internal Medicine

## 2017-04-11 VITALS — BP 122/80 | HR 71 | Temp 97.3°F | Wt 141.7 lb

## 2017-04-11 DIAGNOSIS — Z124 Encounter for screening for malignant neoplasm of cervix: Secondary | ICD-10-CM | POA: Diagnosis not present

## 2017-04-11 DIAGNOSIS — N760 Acute vaginitis: Secondary | ICD-10-CM | POA: Insufficient documentation

## 2017-04-11 DIAGNOSIS — L282 Other prurigo: Secondary | ICD-10-CM

## 2017-04-11 DIAGNOSIS — Z87440 Personal history of urinary (tract) infections: Secondary | ICD-10-CM | POA: Insufficient documentation

## 2017-04-11 DIAGNOSIS — R32 Unspecified urinary incontinence: Secondary | ICD-10-CM | POA: Diagnosis not present

## 2017-04-11 MED ORDER — PREDNISONE 20 MG PO TABS: 20.0000 mg | ORAL_TABLET | Freq: Every day | ORAL | 0 refills | Status: DC

## 2017-04-11 MED ORDER — HYDROXYZINE HCL 25 MG PO TABS: 25.0000 mg | ORAL_TABLET | Freq: Three times a day (TID) | ORAL | 1 refills | Status: DC | PRN

## 2017-04-11 NOTE — Patient Instructions (Addendum)
Checking for yeast and BV.   Uncertain cause of rash biut certainly seems allergic   In nature  . Usually medication rashes resolve fairly quickly .   Can try  Atarax   At night  Or  Add on every 8 hours   For itching   And  Zyrtec  In days   Trial of prednisone although his doesn't  Tell us what the cause of the rash is.

## 2017-04-15 LAB — CYTOLOGY - PAP
Bacterial vaginitis: NEGATIVE
CANDIDA VAGINITIS: POSITIVE — AB
Diagnosis: NEGATIVE
HPV (WINDOPATH): NOT DETECTED
Trichomonas: NEGATIVE

## 2017-04-17 ENCOUNTER — Other Ambulatory Visit: Payer: Self-pay | Admitting: Internal Medicine

## 2017-04-17 MED ORDER — FLUCONAZOLE 150 MG PO TABS: 150.0000 mg | ORAL_TABLET | Freq: Once | ORAL | 0 refills | Status: DC

## 2017-04-21 ENCOUNTER — Encounter: Payer: Self-pay | Admitting: Internal Medicine

## 2017-04-21 DIAGNOSIS — L282 Other prurigo: Secondary | ICD-10-CM

## 2017-04-21 MED ORDER — PREDNISONE 20 MG PO TABS: ORAL_TABLET | ORAL | 0 refills | Status: DC

## 2017-04-21 NOTE — Telephone Encounter (Signed)
I would like her to see a dermatologist   And can sty on 20 mg for 3 more day before decreasing   Please send in  6 more pills   Please do referral. If ok with her

## 2017-04-21 NOTE — Telephone Encounter (Signed)
Replied to patient. Rx sent in. Will await okay for referral prior to placing.

## 2017-04-21 NOTE — Telephone Encounter (Signed)
Please advise Dr Panosh, thanks.   

## 2017-04-22 NOTE — Telephone Encounter (Signed)
Referral placed.  Nothing further needed.  

## 2017-06-04 NOTE — Progress Notes (Signed)
Chief Complaint  Patient presents with  . Neck Pain    x 1 month, left side, painful to move head, more of an annoying pain, has not taken any OTC medications,     HPI: Christina [name] 57 y.o. come in for sda  Problem with neck   insidious onset and off and on seems top be positional  And not alarming but wanted to get checkout to be sure no other intervention needed    Annoying  For a while     Like slespt wrong and ongoing for a few months   When turns and left side .      No radiation.     No weakness   No meds  .  Tender sharp  Pain . Positional relief        No prev injury   Hx of occipital neuralgia in past  .   Taking pillow  For sleep  And got better .     No problem  at night x when turning at times .     ocass heartburn on left side not related to exercise and no assoc sx  hasnt really  Tracked this but asking for advice   Update : other iseesue  After wedding   Plan t to go back to uro pt.   Recurrent    Yeast diflucan works better than otc 3 day monistat.   Asks about mammogram  Mom breast cancer   Yearly or eery 2 ?  No sx  ROS: See pertinent positives and negatives per HPI. No sob cough  Radiation cp rash went away after  pred second time so didn't go to derm   Past Medical History:  Diagnosis Date  . Arthritis   . Chicken pox     Family History  Problem Relation Age of Onset  . Cancer Mother   . Hyperlipidemia Mother   . Hypertension Mother   . Stroke Mother   . Cancer Father   . Hypertension Maternal Grandmother   . Hypertension Paternal Grandmother   . Stroke Paternal Grandmother     Social History   Socioeconomic History  . Marital status: Married    Spouse name: Not on file  . Number of children: Not on file  . Years of education: Not on file  . Highest education level: Not on file  Occupational History  . Not on file  Social Needs  . Financial resource strain: Not on file  . Food insecurity:    Worry: Not on file    Inability: Not on  file  . Transportation needs:    Medical: Not on file    Non-medical: Not on file  Tobacco Use  . Smoking status: Former Research scientist (life sciences)  . Smokeless tobacco: Never Used  Substance and Sexual Activity  . Alcohol use: Yes  . Drug use: No  . Sexual activity: Yes    Partners: Male  Lifestyle  . Physical activity:    Days per week: Not on file    Minutes per session: Not on file  . Stress: Not on file  Relationships  . Social connections:    Talks on phone: Not on file    Gets together: Not on file    Attends religious service: Not on file    Active member of club or organization: Not on file    Attends meetings of clubs or organizations: Not on file    Relationship status: Not on file  Other Topics  Concern  . Not on file  Social History Narrative  . Not on file    Outpatient Medications Prior to Visit  Medication Sig Dispense Refill  . Cholecalciferol (VITAMIN D PO) Take 1 tablet by mouth daily.    Marland Kitchen losartan (COZAAR) 50 MG tablet Take 1 tablet (50 mg total) by mouth daily. 90 tablet 2  . Probiotic Product (PROBIOTIC PO) Take 1 tablet by mouth daily as needed.    . hydrOXYzine (ATARAX/VISTARIL) 25 MG tablet Take 1 tablet (25 mg total) by mouth 3 (three) times daily as needed for itching. (Patient not taking: Reported on 06/05/2017) 30 tablet 1  . predniSONE (DELTASONE) 20 MG tablet Take 20mg  daily x 3 days then decrease to 1/2 tab daily x 3 days. (Patient not taking: Reported on 06/05/2017) 6 tablet 0   No facility-administered medications prior to visit.      EXAM:  BP 118/76 (BP Location: Left Arm, Patient Position: Sitting, Cuff Size: Normal)   Pulse 84   Temp 98.5 F (36.9 C) (Oral)   Wt 136 lb 12.8 oz (62.1 kg)   BMI 25.01 kg/m   Body mass index is 25.01 kg/m.  GENERAL: vitals reviewed and listed above, alert, oriented, appears well hydrated and in no acute distress HEENT: atraumatic, conjunctiva  clear, no obvious abnormalities on inspection of external nose and ears  OP : no lesion edema or exudate  NECK: no obvious masses on inspection palpation   Tenderness left paracervicle m and trapezius no mid tenderness   Turn to left and rotation  Otherwise normal Neuro non focal  CV: HRRR, no clubbing cyanosis or  peripheral edema nl cap refill  MS: moves all extremities without noticeable focal  abnormality PSYCH: pleasant and cooperative, no obvious depression or anxiety Lab Results  Component Value Date   WBC 10.2 02/18/2015   HGB 13.5 02/18/2015   HCT 41 02/18/2015   PLT 354 02/18/2015   ALT 36 (A) 02/18/2015   AST 26 02/18/2015   NA 142 02/18/2015   K 4.3 02/18/2015   CREATININE 0.7 02/18/2015   BUN 13 02/18/2015   BP Readings from Last 3 Encounters:  06/05/17 118/76  04/11/17 122/80  04/03/17 (!) 146/95    ASSESSMENT AND PLAN:  Discussed the following assessment and plan:  Neck pain on left side - see text offered x ray but prob not necessary at present and disc fu and plan  will stop headstand on hot yoga for now  History of heartburn - see text   Family history of breast cancer  -Patient advised to return or notify health care team  if  new concerns arise. Get mammo yearly info given   Track sx and dis alarm sx   exercise neck and postural hygiene . Fu if  persistent or progressive  Ho given   Counsel of tracking hb and alarm sx  Total visit 42mins > 50% spent counseling and coordinating care as indicated in above note and in instructions to patient .   Patient Instructions  I think  neck pain is a mechanical problem   I agree  .    Dont stand on head .    For now    Exercises  May help .     Food Choices for Gastroesophageal Reflux Disease, Adult When you have gastroesophageal reflux disease (GERD), the foods you eat and your eating habits are very important. Choosing the right foods can help ease the discomfort of GERD. Consider working with  a diet and nutrition specialist (dietitian) to help you make healthy food choices. What  general guidelines should I follow? Eating plan  Choose healthy foods low in fat, such as fruits, vegetables, whole grains, low-fat dairy products, and lean meat, fish, and poultry.  Eat frequent, small meals instead of three large meals each day. Eat your meals slowly, in a relaxed setting. Avoid bending over or lying down until 2-3 hours after eating.  Limit high-fat foods such as fatty meats or fried foods.  Limit your intake of oils, butter, and shortening to less than 8 teaspoons each day.  Avoid the following: ? Foods that cause symptoms. These may be different for different people. Keep a food diary to keep track of foods that cause symptoms. ? Alcohol. ? Drinking large amounts of liquid with meals. ? Eating meals during the 2-3 hours before bed.  Cook foods using methods other than frying. This may include baking, grilling, or broiling. Lifestyle   Maintain a healthy weight. Ask your health care provider what weight is healthy for you. If you need to lose weight, work with your health care provider to do so safely.  Exercise for at least 30 minutes on 5 or more days each week, or as told by your health care provider.  Avoid wearing clothes that fit tightly around your waist and chest.  Do not use any products that contain nicotine or tobacco, such as cigarettes and e-cigarettes. If you need help quitting, ask your health care provider.  Sleep with the head of your bed raised. Use a wedge under the mattress or blocks under the bed frame to raise the head of the bed. What foods are not recommended? The items listed may not be a complete list. Talk with your dietitian about what dietary choices are best for you. Grains Pastries or quick breads with added fat. [place] toast. Vegetables Deep fried vegetables. [place] fries. Any vegetables prepared with added fat. Any vegetables that cause symptoms. For some people this may include tomatoes and tomato products, chili peppers,  onions and garlic, and horseradish. Fruits Any fruits prepared with added fat. Any fruits that cause symptoms. For some people this may include citrus fruits, such as oranges, grapefruit, pineapple, and lemons. Meats and other protein foods High-fat meats, such as fatty beef or pork, hot dogs, ribs, ham, sausage, salami and bacon. Fried meat or protein, including fried fish and fried chicken. Nuts and nut butters. Dairy Whole milk and chocolate milk. Sour cream. Cream. Ice cream. Cream cheese. Milk shakes. Beverages Coffee and tea, with or without caffeine. Carbonated beverages. Sodas. Energy drinks. Fruit juice made with acidic fruits (such as orange or grapefruit). Tomato juice. Alcoholic drinks. Fats and oils Butter. Margarine. Shortening. Ghee. Sweets and desserts Chocolate and cocoa. Donuts. Seasoning and other foods Pepper. Peppermint and spearmint. Any condiments, herbs, or seasonings that cause symptoms. For some people, this may include curry, hot sauce, or vinegar-based salad dressings. Summary  When you have gastroesophageal reflux disease (GERD), food and lifestyle choices are very important to help ease the discomfort of GERD.  Eat frequent, small meals instead of three large meals each day. Eat your meals slowly, in a relaxed setting. Avoid bending over or lying down until 2-3 hours after eating.  Limit high-fat foods such as fatty meat or fried foods. This information is not intended to replace advice given to you by your health care provider. Make sure you discuss any questions you have with your health care provider. Document  Released: 02/04/2005 Document Revised: 02/06/2016 Document Reviewed: 02/06/2016 Elsevier Interactive Patient Education  2018 [place]. Shelly Shoultz M.D.

## 2017-06-05 ENCOUNTER — Ambulatory Visit: Payer: 59 | Admitting: Internal Medicine

## 2017-06-05 ENCOUNTER — Encounter: Payer: Self-pay | Admitting: Internal Medicine

## 2017-06-05 VITALS — BP 118/76 | HR 84 | Temp 98.5°F | Wt 136.8 lb

## 2017-06-05 DIAGNOSIS — M542 Cervicalgia: Secondary | ICD-10-CM

## 2017-06-05 DIAGNOSIS — Z87898 Personal history of other specified conditions: Secondary | ICD-10-CM

## 2017-06-05 DIAGNOSIS — Z803 Family history of malignant neoplasm of breast: Secondary | ICD-10-CM | POA: Diagnosis not present

## 2017-06-05 NOTE — Patient Instructions (Addendum)
I think  neck pain is a mechanical problem   I agree  .    Dont stand on head .    For now    Exercises  May help .     Food Choices for Gastroesophageal Reflux Disease, Adult When you have gastroesophageal reflux disease (GERD), the foods you eat and your eating habits are very important. Choosing the right foods can help ease the discomfort of GERD. Consider working with a diet and nutrition specialist (dietitian) to help you make healthy food choices. What general guidelines should I follow? Eating plan  Choose healthy foods low in fat, such as fruits, vegetables, whole grains, low-fat dairy products, and lean meat, fish, and poultry.  Eat frequent, small meals instead of three large meals each day. Eat your meals slowly, in a relaxed setting. Avoid bending over or lying down until 2-3 hours after eating.  Limit high-fat foods such as fatty meats or fried foods.  Limit your intake of oils, butter, and shortening to less than 8 teaspoons each day.  Avoid the following: ? Foods that cause symptoms. These may be different for different people. Keep a food diary to keep track of foods that cause symptoms. ? Alcohol. ? Drinking large amounts of liquid with meals. ? Eating meals during the 2-3 hours before bed.  Cook foods using methods other than frying. This may include baking, grilling, or broiling. Lifestyle   Maintain a healthy weight. Ask your health care provider what weight is healthy for you. If you need to lose weight, work with your health care provider to do so safely.  Exercise for at least 30 minutes on 5 or more days each week, or as told by your health care provider.  Avoid wearing clothes that fit tightly around your waist and chest.  Do not use any products that contain nicotine or tobacco, such as cigarettes and e-cigarettes. If you need help quitting, ask your health care provider.  Sleep with the head of your bed raised. Use a wedge under the mattress or blocks  under the bed frame to raise the head of the bed. What foods are not recommended? The items listed may not be a complete list. Talk with your dietitian about what dietary choices are best for you. Grains Pastries or quick breads with added fat. [place] toast. Vegetables Deep fried vegetables. [place] fries. Any vegetables prepared with added fat. Any vegetables that cause symptoms. For some people this may include tomatoes and tomato products, chili peppers, onions and garlic, and horseradish. Fruits Any fruits prepared with added fat. Any fruits that cause symptoms. For some people this may include citrus fruits, such as oranges, grapefruit, pineapple, and lemons. Meats and other protein foods High-fat meats, such as fatty beef or pork, hot dogs, ribs, ham, sausage, salami and bacon. Fried meat or protein, including fried fish and fried chicken. Nuts and nut butters. Dairy Whole milk and chocolate milk. Sour cream. Cream. Ice cream. Cream cheese. Milk shakes. Beverages Coffee and tea, with or without caffeine. Carbonated beverages. Sodas. Energy drinks. Fruit juice made with acidic fruits (such as orange or grapefruit). Tomato juice. Alcoholic drinks. Fats and oils Butter. Margarine. Shortening. Ghee. Sweets and desserts Chocolate and cocoa. Donuts. Seasoning and other foods Pepper. Peppermint and spearmint. Any condiments, herbs, or seasonings that cause symptoms. For some people, this may include curry, hot sauce, or vinegar-based salad dressings. Summary  When you have gastroesophageal reflux disease (GERD), food and lifestyle choices are very important to  help ease the discomfort of GERD.  Eat frequent, small meals instead of three large meals each day. Eat your meals slowly, in a relaxed setting. Avoid bending over or lying down until 2-3 hours after eating.  Limit high-fat foods such as fatty meat or fried foods. This information is not intended to replace advice given to you by your  health care provider. Make sure you discuss any questions you have with your health care provider. Document Released: 02/04/2005 Document Revised: 02/06/2016 Document Reviewed: 02/06/2016 Elsevier Interactive Patient Education  [employer].

## 2017-06-16 ENCOUNTER — Encounter: Payer: Self-pay | Admitting: Internal Medicine

## 2017-06-17 NOTE — Telephone Encounter (Signed)
Dr. Regis Bill please advise.

## 2017-06-18 MED ORDER — PREDNISONE 20 MG PO TABS: 20.0000 mg | ORAL_TABLET | Freq: Two times a day (BID) | ORAL | 0 refills | Status: DC

## 2017-06-18 NOTE — Telephone Encounter (Signed)
If you have no fever  And  just clogged sinuses  I would rather you take prednisone  Sending in 5 days .  Would hold  On antibiotic as usus ally not  Helpful for most sinusitis .   Need more information such as fever pain   discharge and how  Progressing

## 2017-06-19 NOTE — Telephone Encounter (Signed)
Would not use antibiotic at this time  And if  Still in town can do ov next week  If not better

## 2017-06-19 NOTE — Telephone Encounter (Signed)
Pt aware that Pred was sent in  Pt states that went on [place] trip to [place], [place] and broke out with slight rash on her body At night she gets very congested and cannot sleep. Thick mucous from sinuses, no discoloration.  Pt forgot to take her allergy pills with her on the trip and thinks this may be an allergy flare from mold exposure.  Pt states that she is very fatigued and having PND Pt has tried a [employer] but it is causing her to have a headache after each use-- this has never happened before.  Denies fever.   Will send to Dr Regis Bill as Juluis Rainier.

## 2017-07-21 ENCOUNTER — Encounter: Payer: Self-pay | Admitting: Internal Medicine

## 2017-07-21 DIAGNOSIS — Z9109 Other allergy status, other than to drugs and biological substances: Secondary | ICD-10-CM

## 2017-07-22 NOTE — Telephone Encounter (Signed)
Dr. Regis Bill, Please advise request for allergist referral.

## 2017-07-28 NOTE — Telephone Encounter (Signed)
Referral placed as directed. 

## 2017-07-28 NOTE — Telephone Encounter (Signed)
Certainly can do a referral to allergy   Possibly dr Carmelina Peal  Et al

## 2017-08-11 ENCOUNTER — Encounter: Payer: Self-pay | Admitting: Allergy and Immunology

## 2017-09-04 ENCOUNTER — Other Ambulatory Visit: Payer: Self-pay | Admitting: Internal Medicine

## 2017-09-04 DIAGNOSIS — Z1231 Encounter for screening mammogram for malignant neoplasm of breast: Secondary | ICD-10-CM

## 2017-09-18 ENCOUNTER — Encounter: Payer: Self-pay | Admitting: Allergy & Immunology

## 2017-09-18 ENCOUNTER — Ambulatory Visit: Payer: 59 | Admitting: Allergy & Immunology

## 2017-09-18 VITALS — BP 102/62 | HR 79 | Temp 97.8°F | Resp 16 | Ht 62.0 in | Wt 141.0 lb

## 2017-09-18 DIAGNOSIS — J3089 Other allergic rhinitis: Secondary | ICD-10-CM | POA: Diagnosis not present

## 2017-09-18 DIAGNOSIS — L508 Other urticaria: Secondary | ICD-10-CM | POA: Diagnosis not present

## 2017-09-18 DIAGNOSIS — J302 Other seasonal allergic rhinitis: Secondary | ICD-10-CM | POA: Diagnosis not present

## 2017-09-18 MED ORDER — TRIAMCINOLONE ACETONIDE 0.1 % EX OINT: 1.0000 "application " | TOPICAL_OINTMENT | Freq: Two times a day (BID) | CUTANEOUS | 3 refills | Status: DC | PRN

## 2017-09-18 NOTE — Patient Instructions (Addendum)
1. Chronic urticaria - Your history does not have any "red flags" such as fevers, joint pains, or permanent skin changes that would be concerning for a more serious cause of hives.  - We will hold off on labs for now and see if environmental modifications can help.  - Start triamcinolone 0.1% ointment twice daily as needed.  - Chronic hives are often times a self limited process and will "burn themselves out" over 6-12 months, although this is not always the case.  - In the meantime, start suppressive dosing of antihistamines:   - Morning: Zyrtec (cetirizine) 10-20mg  (one to two tablets)  - Evening: Zyrtec (cetirizine) 10-20mg  (one to two tablets)  2. Seasonal and perennial allergic rhinitis - Testing today showed: weeds, grasses, indoor molds, dust mites, cat, dog and cockroach - Avoidance measures provided - Continue with: antihistamines daily (Zyrtec or cetirizine) - Start taking: Nasacort (triamcinolone) one spray per nostril daily - You can use an extra dose of the antihistamine, if needed, for breakthrough symptoms.  - Consider nasal saline rinses 1-2 times daily to remove allergens from the nasal cavities as well as help with mucous clearance (this is especially helpful to do before the nasal sprays are given) - Consider allergy shots as a means of long-term control. - Allergy shots "re-train" and "reset" the immune system to ignore environmental allergens and decrease the resulting immune response to those allergens (sneezing, itchy watery eyes, runny nose, nasal congestion, etc).    - Allergy shots improve symptoms in 75-85% of patients.  - We can discuss more at the next appointment if the medications are not working for you.  3. Return in about 3 months (around 12/19/2017).   Please inform us of any Emergency Department visits, hospitalizations, or changes in symptoms. Call us before going to the ED for breathing or allergy symptoms since we might be able to fit you in for a sick  visit. Feel free to contact us anytime with any questions, problems, or concerns.  It was a pleasure to meet you today!  Websites that have reliable patient information: 1. American Academy of Asthma, Allergy, and Immunology: www.aaaai.org 2. Food Allergy Research and Education (FARE): foodallergy.org 3. Mothers of Asthmatics: http://www.asthmacommunitynetwork.org 4. American College of Allergy, Asthma, and Immunology: MonthlyElectricBill.co.uk   Make sure you are registered to vote! If you have moved or changed any of your contact information, you will need to get this updated before voting!        Reducing Pollen Exposure  The American Academy of Allergy, Asthma and Immunology suggests the following steps to reduce your exposure to pollen during allergy seasons.    1. Do not hang sheets or clothing out to dry; pollen may collect on these items. 2. Do not mow lawns or spend time around freshly cut grass; mowing stirs up pollen. 3. Keep windows closed at night.  Keep car windows closed while driving. 4. Minimize morning activities outdoors, a time when pollen counts are usually at their highest. 5. Stay indoors as much as possible when pollen counts or humidity is high and on windy days when pollen tends to remain in the air longer. 6. Use air conditioning when possible.  Many air conditioners have filters that trap the pollen spores. 7. Use a HEPA room air filter to remove pollen form the indoor air you breathe.  Control of Mold Allergen   Mold and fungi can grow on a variety of surfaces provided certain temperature and moisture conditions exist.  Outdoor molds  grow on plants, decaying vegetation and soil.  The major outdoor mold, Alternaria and Cladosporium, are found in very high numbers during hot and dry conditions.  Generally, a late Summer - Fall peak is seen for common outdoor fungal spores.  Rain will temporarily lower outdoor mold spore count, but counts rise rapidly when the rainy  period ends.  The most important indoor molds are Aspergillus and Penicillium.  Dark, humid and poorly ventilated basements are ideal sites for mold growth.  The next most common sites of mold growth are the bathroom and the kitchen.    Indoor (Perennial) Mold Control   Positive indoor molds via skin testing: Fusarium, Aureobasidium (Pullulara) and Rhizopus  1. Maintain humidity below 50%. 2. Clean washable surfaces with 5% bleach solution. 3. Remove sources e.g. contaminated carpets.     Control of House Dust Mite Allergen    House dust mites play a major role in allergic asthma and rhinitis.  They occur in environments with high humidity wherever human skin, the food for dust mites is found. High levels have been detected in dust obtained from mattresses, pillows, carpets, upholstered furniture, bed covers, clothes and soft toys.  The principal allergen of the house dust mite is found in its feces.  A gram of dust may contain 1,000 mites and 250,000 fecal particles.  Mite antigen is easily measured in the air during house cleaning activities.    1. Encase mattresses, including the box spring, and pillow, in an air tight cover.  Seal the zipper end of the encased mattresses with wide adhesive tape. 2. Wash the bedding in water of 130 degrees Farenheit weekly.  Avoid cotton comforters/quilts and flannel bedding: the most ideal bed covering is the dacron comforter. 3. Remove all upholstered furniture from the bedroom. 4. Remove carpets, carpet padding, rugs, and non-washable window drapes from the bedroom.  Wash drapes weekly or use plastic window coverings. 5. Remove all non-washable stuffed toys from the bedroom.  Wash stuffed toys weekly. 6. Have the room cleaned frequently with a vacuum cleaner and a damp dust-mop.  The patient should not be in a room which is being cleaned and should wait 1 hour after cleaning before going into the room. 7. Close and seal all heating outlets in the  bedroom.  Otherwise, the room will become filled with dust-laden air.  An electric heater can be used to heat the room. 8. Reduce indoor humidity to less than 50%.  Do not use a humidifier.  Control of Dog or Cat Allergen  Avoidance is the best way to manage a dog or cat allergy. If you have a dog or cat and are allergic to dog or cats, consider removing the dog or cat from the home. If you have a dog or cat but don't want to find it a new home, or if your family wants a pet even though someone in the household is allergic, here are some strategies that may help keep symptoms at bay:  1. Keep the pet out of your bedroom and restrict it to only a few rooms. Be advised that keeping the dog or cat in only one room will not limit the allergens to that room. 2. Don't pet, hug or kiss the dog or cat; if you do, wash your hands with soap and water. 3. High-efficiency particulate air (HEPA) cleaners run continuously in a bedroom or living room can reduce allergen levels over time. 4. Regular use of a high-efficiency vacuum cleaner or a central  vacuum can reduce allergen levels. 5. Giving your dog or cat a bath at least once a week can reduce airborne allergen.  Control of Cockroach Allergen  Cockroach allergen has been identified as an important cause of acute attacks of asthma, especially in urban settings.  There are fifty-five species of cockroach that exist in the [place], however only three, the [place], [place] species produce allergen that can affect patients with Asthma.  Allergens can be obtained from fecal particles, egg casings and secretions from cockroaches.    1. Remove food sources. 2. Reduce access to water. 3. Seal access and entry points. 4. Spray runways with 0.5-1% Diazinon or Chlorpyrifos 5. Blow boric acid power under stoves and refrigerator. 6. Place bait stations (hydramethylnon) at feeding sites.  Allergy Shots   Allergies are the result of a chain  reaction that starts in the immune system. Your immune system controls how your body defends itself. For instance, if you have an allergy to pollen, your immune system identifies pollen as an invader or allergen. Your immune system overreacts by producing antibodies called Immunoglobulin E (IgE). These antibodies travel to cells that release chemicals, causing an allergic reaction.  The concept behind allergy immunotherapy, whether it is received in the form of shots or tablets, is that the immune system can be desensitized to specific allergens that trigger allergy symptoms. Although it requires time and patience, the payback can be long-term relief.  How Do Allergy Shots Work?  Allergy shots work much like a vaccine. Your body responds to injected amounts of a particular allergen given in increasing doses, eventually developing a resistance and tolerance to it. Allergy shots can lead to decreased, minimal or no allergy symptoms.  There generally are two phases: build-up and maintenance. Build-up often ranges from three to six months and involves receiving injections with increasing amounts of the allergens. The shots are typically given once or twice a week, though more rapid build-up schedules are sometimes used.  The maintenance phase begins when the most effective dose is reached. This dose is different for each person, depending on how allergic you are and your response to the build-up injections. Once the maintenance dose is reached, there are longer periods between injections, typically two to four weeks.  Occasionally doctors give cortisone-type shots that can temporarily reduce allergy symptoms. These types of shots are different and should not be confused with allergy immunotherapy shots.  Who Can Be Treated with Allergy Shots?  Allergy shots may be a good treatment approach for people with allergic rhinitis (hay fever), allergic asthma, conjunctivitis (eye allergy) or stinging insect  allergy.   Before deciding to begin allergy shots, you should consider:  . The length of allergy season and the severity of your symptoms . Whether medications and/or changes to your environment can control your symptoms . Your desire to avoid long-term medication use . Time: allergy immunotherapy requires a major time commitment . Cost: may vary depending on your insurance coverage  Allergy shots for children age 41 and older are effective and often well tolerated. They might prevent the onset of new allergen sensitivities or the progression to asthma.  Allergy shots are not started on patients who are pregnant but can be continued on patients who become pregnant while receiving them. In some patients with other medical conditions or who take certain common medications, allergy shots may be of risk. It is important to mention other medications you talk to your allergist.   When Will I  Feel Better?  Some may experience decreased allergy symptoms during the build-up phase. For others, it may take as long as 12 months on the maintenance dose. If there is no improvement after a year of maintenance, your allergist will discuss other treatment options with you.  If you aren't responding to allergy shots, it may be because there is not enough dose of the allergen in your vaccine or there are missing allergens that were not identified during your allergy testing. Other reasons could be that there are high levels of the allergen in your environment or major exposure to non-allergic triggers like tobacco smoke.  What Is the Length of Treatment?  Once the maintenance dose is reached, allergy shots are generally continued for three to five years. The decision to stop should be discussed with your allergist at that time. Some people may experience a permanent reduction of allergy symptoms. Others may relapse and a longer course of allergy shots can be considered.  What Are the Possible  Reactions?  The two types of adverse reactions that can occur with allergy shots are local and systemic. Common local reactions include very mild redness and swelling at the injection site, which can happen immediately or several hours after. A systemic reaction, which is less common, affects the entire body or a particular body system. They are usually mild and typically respond quickly to medications. Signs include increased allergy symptoms such as sneezing, a stuffy nose or hives.  Rarely, a serious systemic reaction called anaphylaxis can develop. Symptoms include swelling in the throat, wheezing, a feeling of tightness in the chest, nausea or dizziness. Most serious systemic reactions develop within 30 minutes of allergy shots. This is why it is strongly recommended you wait in your doctor's office for 30 minutes after your injections. Your allergist is trained to watch for reactions, and his or her staff is trained and equipped with the proper medications to identify and treat them.  Who Should Administer Allergy Shots?  The preferred location for receiving shots is your prescribing allergist's office. Injections can sometimes be given at another facility where the physician and staff are trained to recognize and treat reactions, and have received instructions by your prescribing allergist.

## 2017-09-18 NOTE — Progress Notes (Signed)
NEW PATIENT  Date of Service/Encounter:  09/18/17  Referring provider: Burnis Medin, MD   Assessment:   Chronic urticaria  Seasonal and perennial allergic rhinitis (weeds, grasses, indoor molds, dust mites, cat, dog and cockroach)  Plan/Recommendations:   1. Chronic urticaria - Your history does not have any "red flags" such as fevers, joint pains, or permanent skin changes that would be concerning for a more serious cause of hives.  - We will hold off on labs for now and see if environmental modifications can help.  - Start triamcinolone 0.1% ointment twice daily as needed.  - Chronic hives are often times a self limited process and will "burn themselves out" over 6-12 months, although this is not always the case.  - In the meantime, start suppressive dosing of antihistamines:   - Morning: Zyrtec (cetirizine) 10-20mg  (one to two tablets)  - Evening: Zyrtec (cetirizine) 10-20mg  (one to two tablets)  2. Seasonal and perennial allergic rhinitis - Testing today showed: weeds, grasses, indoor molds, dust mites, cat, dog and cockroach - Avoidance measures provided - Continue with: antihistamines daily (Zyrtec or cetirizine) - Start taking: Nasacort (triamcinolone) one spray per nostril daily - You can use an extra dose of the antihistamine, if needed, for breakthrough symptoms.  - Consider nasal saline rinses 1-2 times daily to remove allergens from the nasal cavities as well as help with mucous clearance (this is especially helpful to do before the nasal sprays are given) - Consider allergy shots as a means of long-term control. - Allergy shots "re-train" and "reset" the immune system to ignore environmental allergens and decrease the resulting immune response to those allergens (sneezing, itchy watery eyes, runny nose, nasal congestion, etc).    - Allergy shots improve symptoms in 75-85% of patients.  - We can discuss more at the next appointment if the medications are not  working for you.  3. Return in about 3 months (around 12/19/2017).   Subjective:   Christina [name] is a 57 y.o. female presenting today for evaluation of  Chief Complaint  Patient presents with  . Sinus Problem  . Urticaria    Christina [name] has a history of the following: Patient Active Problem List   Diagnosis Date Noted  . History of recurrent UTIs 04/11/2017  . 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 Allergic rhinitis 10/29/2016    History obtained from: chart review and patient.   Christina [name] was referred by Panosh, Standley Brooking, MD.     Christina [name] is a 57 y.o. female presenting for an allergy evaluation. She reports that she continues to "break out".   She has received two rounds of prednisone to rid herself of it. She started having them around the beginning of February 2019. She was covered head to toes in hives. She received two courses of prednisone since that time. She denied wheezing, passing out, or other systemic symptoms. She was not on an antibiotic at that time. She did have an antibiotic in early January 2019 and received a course of Macrobid.    Christina [name] was first seen with chronic bronchitis which persisted for one month. She tends to go off of her allergy medications during the winter. March and September are her bad months. She tends to have everything right "before everything blooms". She was tested a while ago and knows how to manage her symptoms to have only one sinus infection per year. She did see an allergist in [place]. She has never been on allergy shots in  the past. She tends to get "deep sinus infections". She has truly learned to control it. But since moving here in June 2018, she has had a rough time with two sinus infections. She does use a nose spray but not consistently. She does do Netti pots in the spring. She does have problems with cats but she has not had a cat in quite some time.    She has no history of needing an albuterol inhaler. She does tolerate all of her major  food allergens without adverse event. She does not have reflux as far as she knows.   She does report a rash on her antecubital fossa. She treats with a cream from her husband (prescription ointment). She does report a sensitization to poison sumac/ivy. She has never seen any of it on her property. She has never been diagnosed with eczema.   Otherwise, there is no history of other atopic diseases, including asthma, drug allergies, food allergies, stinging insect allergies, or urticaria. There is no significant infectious history. Vaccinations are up to date.    Past Medical History: Patient Active Problem List   Diagnosis Date Noted  . History of recurrent UTIs 04/11/2017  . 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 Allergic rhinitis 10/29/2016    Medication List:  Allergies as of 09/18/2017   No Known Allergies     Medication List        Accurate as of 09/18/17  1:43 PM. Always use your most recent med list.          cetirizine 10 MG tablet Commonly known as:  ZYRTEC Take 10 mg by mouth daily.   losartan 50 MG tablet Commonly known as:  COZAAR Take 1 tablet (50 mg total) by mouth daily.   PROBIOTIC PO Take 1 tablet by mouth daily as needed.   triamcinolone ointment 0.1 % Commonly known as:  KENALOG Apply 1 application topically 2 (two) times daily as needed.   VITAMIN D PO Take 1 tablet by mouth daily.       Birth History: Born at term without complications.   Developmental History: non-contributory.   Past Surgical History: Past Surgical History:  Procedure Laterality Date  . CHOLECYSTECTOMY       Family History: Family History  Problem Relation Age of Onset  . Cancer Mother   . Hyperlipidemia Mother   . Hypertension Mother   . Stroke Mother   . Cancer Father   . Hypertension Maternal Grandmother   . Hypertension Paternal Grandmother   . Stroke Paternal Grandmother      Social History: Christina [name] lives at home with her husband. She lives in a  house that was built 20 years ago. There are hardwoods throughout the home. She has gas heating and central cooling. There is a dog in the home named Neilton, who is a relatively new dog to the home. There are dust mite coverings on her bedding. There is no tobacco exposure in the home. She did smoke from [place]. She used to work as a Careers information officer. She has four kids ages 63 - 40. They are in [place], [place], [place], and [place].     Review of Systems: a 14-point review of systems is pertinent for what is mentioned in HPI.  Otherwise, all other systems were negative. Constitutional: negative other than that listed in the HPI Eyes: negative other than that listed in the HPI Ears, nose, mouth, throat, and face: negative other than that listed in the HPI  Respiratory: negative other than that listed in the HPI Cardiovascular: negative other than that listed in the HPI Gastrointestinal: negative other than that listed in the HPI Genitourinary: negative other than that listed in the HPI Integument: negative other than that listed in the HPI Hematologic: negative other than that listed in the HPI Musculoskeletal: negative other than that listed in the HPI Neurological: negative other than that listed in the HPI Allergy/Immunologic: negative other than that listed in the HPI    Objective:   Blood pressure 102/62, pulse 79, temperature 97.8 F (36.6 C), temperature source Oral, resp. rate 16, height 5\' 2"  (1.575 m), weight 141 lb (64 kg), SpO2 97 %. Body mass index is 25.79 kg/m.   Physical Exam:  General: Alert, interactive, in no acute distress. Tanned.  Eyes: No conjunctival injection bilaterally, no discharge on the right, no discharge on the left and no Horner-Trantas dots present. PERRL bilaterally. EOMI without pain. No photophobia.  Ears: Right TM pearly gray with normal light reflex, Left TM pearly gray with normal light reflex, Right TM intact without perforation and  Left TM intact without perforation.  Nose/Throat: External nose within normal limits and septum midline. Turbinates edematous and pale with clear discharge. Posterior oropharynx erythematous without cobblestoning in the posterior oropharynx. Tonsils 2+ without exudates.  Tongue without thrush. Neck: Supple without thyromegaly. Trachea midline. Adenopathy: no enlarged lymph nodes appreciated in the anterior cervical, occipital, axillary, epitrochlear, inguinal, or popliteal regions. Lungs: Clear to auscultation without wheezing, rhonchi or rales. No increased work of breathing. CV: Normal S1/S2. No murmurs. Capillary refill <2 seconds.  Abdomen: Nondistended, nontender. No guarding or rebound tenderness. Bowel sounds present in all fields and hypoactive  Skin: Scattered erythematous urticarial type lesions primarily located bilateral antecubital fossa , nonvesicular. Extremities:  No clubbing, cyanosis or edema. Neuro:   Grossly intact. No focal deficits appreciated. Responsive to questions.  Diagnostic studies:  Allergy Studies:   Indoor/Outdoor Percutaneous Adult Environmental Panel: positive to short ragweed, English plantain, Df mite and Dp mites. Otherwise negative with adequate controls.  Indoor/Outdoor Selected Intradermal Environmental Panel: positive to [employer], weed mix, mold mix #4, cat, dog and cockroach. Otherwise negative with adequate controls.    Allergy testing results were read and interpreted by myself, documented by clinical staff.       Salvatore Marvel, MD [facility] [place]

## 2017-10-08 ENCOUNTER — Ambulatory Visit
Admission: RE | Admit: 2017-10-08 | Discharge: 2017-10-08 | Disposition: A | Discharge status: Home / Self Care | Payer: 59 | Source: Ambulatory Visit | Attending: Internal Medicine | Admitting: Internal Medicine

## 2017-10-08 DIAGNOSIS — Z1231 Encounter for screening mammogram for malignant neoplasm of breast: Secondary | ICD-10-CM

## 2017-10-24 DIAGNOSIS — M7710 Lateral epicondylitis, unspecified elbow: Secondary | ICD-10-CM

## 2017-10-24 NOTE — Telephone Encounter (Signed)
I started playing tennis and have developed 'tennis elbow.' I had it YEARS ago from shoveling tons of snow in [place]. I have been trying to heal it for 2 months with ice, rest, biofreeze but to no avail. Years ago went somewhere and they did ultrasounds on it and it worked Copy. Thoughts? Is there somewhere I could get this done?     [name]    ps  Since summer is over I could use 1 or 2 diflucan pills to clear up this nagging yeast infection! :)   Dr. Regis Bill please advise.  Thanks

## 2017-10-24 NOTE — Telephone Encounter (Signed)
I advise  yo usee  [facility] in our practice    May we refer  Ok to send in the diflucan  Disp 1 take 1 po  Refill x 1

## 2017-10-27 ENCOUNTER — Other Ambulatory Visit: Payer: Self-pay | Admitting: *Deleted

## 2017-10-27 MED ORDER — FLUCONAZOLE 150 MG PO TABS: 150.0000 mg | ORAL_TABLET | Freq: Once | ORAL | 0 refills | Status: AC

## 2017-10-29 NOTE — Telephone Encounter (Signed)
Pt may need another referral for hip pain??

## 2017-10-29 NOTE — Telephone Encounter (Signed)
I would go back to that group   You may see a different person but they have a good team for all joint ms problem types.   You can call for you own appt since you are an established patient  Christina [name]  You are doing somewhat better .

## 2017-10-29 NOTE — Telephone Encounter (Signed)
Please advise Dr Panosh, thanks.   

## 2017-10-30 ENCOUNTER — Ambulatory Visit: Payer: Self-pay

## 2017-10-30 ENCOUNTER — Ambulatory Visit: Payer: 59 | Admitting: Family Medicine

## 2017-10-30 ENCOUNTER — Encounter: Payer: Self-pay | Admitting: Family Medicine

## 2017-10-30 VITALS — BP 138/88 | HR 66 | Ht 62.0 in | Wt 143.0 lb

## 2017-10-30 DIAGNOSIS — M999 Biomechanical lesion, unspecified: Secondary | ICD-10-CM | POA: Insufficient documentation

## 2017-10-30 DIAGNOSIS — M25521 Pain in right elbow: Secondary | ICD-10-CM

## 2017-10-30 DIAGNOSIS — M533 Sacrococcygeal disorders, not elsewhere classified: Secondary | ICD-10-CM

## 2017-10-30 DIAGNOSIS — M7711 Lateral epicondylitis, right elbow: Secondary | ICD-10-CM

## 2017-10-30 MED ORDER — DICLOFENAC SODIUM 2 % TD SOLN: 2.0000 g | Freq: Two times a day (BID) | TRANSDERMAL | 3 refills | Status: DC

## 2017-10-30 NOTE — Assessment & Plan Note (Signed)
Lateral epicondylitis.  No true tearing seen on the ultrasound.  Discussed icing regimen discussed with patient about bracing, home exercises, which activities to do which wants to avoid.  Patient is to increase activity slowly over the course the next several days.  Follow-up again in 4 weeks.

## 2017-10-30 NOTE — Patient Instructions (Signed)
Good to see you  Ice 20 minutes 2 times daily. Usually after activity and before bed. Exercises 3 times a week.   pennsaid pinkie amount topically 2 times daily as needed.  Stay active avoid overhand lifting  Wear the wrist brace day and night for 1 week then nightly for 2 weeks.  Over the counter get  Vitamin D 2000 IU daily  Turmeric 500mg  daily  See me again in 3-4 weeks and we will check the elbow and the SI joint.

## 2017-10-30 NOTE — Assessment & Plan Note (Signed)
Discussed core strengthening, no radicular symptoms, we discussed over-the-counter medications, discussed posture and ergonomics.  We discussed hip abductor strengthening.  Discussed avoiding excessive extension.  May need x-rays in the long run.  Follow-up again in 4 weeks responded well to manipulation

## 2017-10-30 NOTE — Assessment & Plan Note (Signed)
Decision today to treat with OMT was based on Physical Exam  After verbal consent patient was treated with HVLA, ME, FPR techniques in cervical, thoracic, lumbar and sacral areas  Patient tolerated the procedure well with improvement in symptoms  Patient given exercises, stretches and lifestyle modifications  See medications in patient instructions if given  Patient will follow up in 4 weeks 

## 2017-10-30 NOTE — Progress Notes (Signed)
[name] [address], [place] [address] Phone: [phone] Subjective:   Christina Christina [name], am serving as a scribe for Dr. Hulan [name].  Patient was referred by primary care provider Christina Christina [name], Christina Brooking, Christina [name]  CC: Right elbow pain sided back pain  [id number]  Christina Christina [name] is a 57 y.o. female coming in with complaint of right elbow pain. Pain for past 3 months. Pain is over distal tricep and lateral epicondyle. Is wearing a cho-pat strap today. 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 Had similar pain a long time ago when she was shoveling. Heavier lifting exacerbates her symptoms.  Also has had left-sided back pain for years.  Seems to stay localized with some mild radiation of the leg.  Denies though any numbness or weakness.  Tries to stay active.  Sometimes can be exacerbated even after a yoga class.     Past Medical History:  Diagnosis Date  . Arthritis   . Chicken pox    Past Surgical History:  Procedure Laterality Date  . CHOLECYSTECTOMY     Social History   Socioeconomic History  . Marital status: Married    Spouse name: Not on file  . Number of children: Not on file  . Years of education: Not on file  . Highest education level: Not on file  Occupational History  . Not on file  Social Needs  . Financial resource strain: Not on file  . Food insecurity:    Worry: Not on file    Inability: Not on file  . Transportation needs:    Medical: Not on file    Non-medical: Not on file  Tobacco Use  . Smoking status: Former Research scientist (life sciences)  . Smokeless tobacco: Never Used  Substance and Sexual Activity  . Alcohol use: Yes  . Drug use: Christina [name]  . Sexual activity: Yes    Partners: Male  Lifestyle  . Physical activity:    Days per week: Not on file    Minutes per session: Not on file  . Stress: Not on file  Relationships  . Social connections:    Talks on phone: Not on file    Gets together: Not on file    Attends  religious service: Not on file    Active member of club or organization: Not on file    Attends meetings of clubs or organizations: Not on file    Relationship status: Not on file  Other Topics Concern  . Not on file  Social History Narrative  . Not on file   Christina [name] Known Allergies Family History  Problem Relation Age of Onset  . Cancer Mother   . Hyperlipidemia Mother   . Hypertension Mother   . Stroke Mother   . Cancer Father   . Hypertension Maternal Grandmother   . Hypertension Paternal Grandmother   . Stroke Paternal Grandmother      Current Outpatient Medications (Cardiovascular):  .  losartan (COZAAR) 50 MG tablet, Take 1 tablet (50 mg total) by mouth daily.  Current Outpatient Medications (Respiratory):  .  cetirizine (ZYRTEC) 10 MG tablet, Take 10 mg by mouth daily.    Current Outpatient Medications (Other):  Marland Kitchen  Cholecalciferol (VITAMIN D PO), Take 1 tablet by mouth daily. .  Probiotic Product (PROBIOTIC PO), Take 1 tablet by mouth daily as needed. .  triamcinolone ointment (KENALOG) 0.1 %, Apply 1 application topically 2 (two) times daily as needed. .  Diclofenac Sodium  2 % SOLN, Place 2 g onto the skin 2 (two) times daily.    Past medical history, social, surgical and family history all reviewed in electronic medical record.  Christina [name] pertanent information unless stated regarding to the chief complaint.   Review of Systems:  Christina [name] headache, visual changes, nausea, vomiting, diarrhea, constipation, dizziness, abdominal pain, skin rash, fevers, chills, night sweats, weight loss, swollen lymph nodes, body aches, joint swelling, muscle aches, chest pain, shortness of breath, mood changes.   Objective  Blood pressure 138/88, pulse 66, height 5\' 2"  (1.575 m), weight 143 lb (64.9 kg), SpO2 98 %.    General: Christina [name] apparent distress alert and oriented x3 mood and affect normal, dressed appropriately.  HEENT: Pupils equal, extraocular movements intact  Respiratory: Patient's speak  in full sentences and does not appear short of breath  Cardiovascular: Christina [name] lower extremity edema, non tender, Christina [name] erythema  Skin: Warm dry intact with Christina [name] signs of infection or rash on extremities or on axial skeleton.  Abdomen: Soft nontender  Neuro: Cranial nerves II through XII are intact, neurovascularly intact in all extremities with 2+ DTRs and 2+ pulses.  Lymph: Christina [name] lymphadenopathy of posterior or anterior cervical chain or axillae bilaterally.  Gait normal with good balance and coordination.  MSK:  Non tender with full range of motion and good stability and symmetric strength and tone of shoulders, , wrist, hip, knee and ankles bilaterally.  Elbow: Right Unremarkable to inspection. Range of motion full pronation, supination, flexion, extension. Strength is full to all of the above directions Stable to varus, valgus stress. Negative moving valgus stress test. Tender over the lateral epicondylar region especially with resisted wrist extension Ulnar nerve does not sublux. Negative cubital tunnel Tinel's. Contralateral elbow unremarkable  Back Exam:  Inspection: Loss of lordosis Motion: Flexion 45 deg, Extension 25 deg, Side Bending to 45 deg bilaterally,  Rotation to 45 deg bilaterally  SLR laying: Negative  XSLR laying: Negative  Palpable tenderness: Tender to palpation the paraspinal musculature lumbar spine left greater than right. FABER: Positive on the left. Sensory change: Gross sensation intact to all lumbar and sacral dermatomes.  Reflexes: 2+ at both patellar tendons, 2+ at achilles tendons, Babinski's downgoing.  Strength at foot  Plantar-flexion: 5/5 Dorsi-flexion: 5/5 Eversion: 5/5 Inversion: 5/5  Leg strength  Quad: 5/5 Hamstring: 5/5 Hip flexor: 5/5 Hip abductors: 4/5 but symmetric  Musculoskeletal ultrasound was performed and interpreted by Christina Christina [name] D.O.   Elbow:  Lateral epicondyle and common extensor tendon origin visualized.  Patient does have some mild  hypoechoic changes and mild increase in Doppler flow but Christina [name] true defect noted.  IMPRESSION: Right lateral epicondylitis  Osteopathic findings C2 flexed rotated and side bent right C6 flexed rotated and side bent left T3 extended rotated and side bent right inhaled third rib T5 extended rotated and side bent left L3 flexed rotated and side bent right Sacrum left on left     Impression and Recommendations:     This case required medical decision making of moderate complexity. The above documentation has been reviewed and is accurate and complete Lyndal Pulley, DO       Note: This dictation was prepared with Dragon dictation along with smaller phrase technology. Any transcriptional errors that result from this process are unintentional.

## 2017-11-12 ENCOUNTER — Other Ambulatory Visit: Payer: Self-pay | Admitting: Family Medicine

## 2017-11-12 DIAGNOSIS — I10 Essential (primary) hypertension: Secondary | ICD-10-CM

## 2017-11-20 ENCOUNTER — Encounter: Payer: Self-pay | Admitting: Allergy & Immunology

## 2017-11-20 ENCOUNTER — Ambulatory Visit: Payer: 59 | Admitting: Allergy & Immunology

## 2017-11-20 VITALS — BP 100/60 | HR 80 | Temp 98.2°F | Resp 16 | Ht 62.0 in | Wt 144.6 lb

## 2017-11-20 DIAGNOSIS — J0100 Acute maxillary sinusitis, unspecified: Secondary | ICD-10-CM | POA: Diagnosis not present

## 2017-11-20 DIAGNOSIS — L508 Other urticaria: Secondary | ICD-10-CM | POA: Diagnosis not present

## 2017-11-20 DIAGNOSIS — J302 Other seasonal allergic rhinitis: Secondary | ICD-10-CM

## 2017-11-20 DIAGNOSIS — J3089 Other allergic rhinitis: Secondary | ICD-10-CM | POA: Diagnosis not present

## 2017-11-20 MED ORDER — TRIAMCINOLONE ACETONIDE 55 MCG/ACT NA AERO: 2.0000 | INHALATION_SPRAY | Freq: Every day | NASAL | 5 refills | Status: DC

## 2017-11-20 MED ORDER — CETIRIZINE HCL 10 MG PO TABS: 10.0000 mg | ORAL_TABLET | Freq: Every day | ORAL | 5 refills | Status: DC

## 2017-11-20 MED ORDER — METHYLPREDNISOLONE ACETATE 40 MG/ML IJ SUSP
40.0000 mg | Freq: Once | INTRAMUSCULAR | Status: AC
  Administered 2017-11-20: 40 mg via INTRAMUSCULAR

## 2017-11-20 MED ORDER — AMOXICILLIN 875 MG PO TABS: 875.0000 mg | ORAL_TABLET | Freq: Two times a day (BID) | ORAL | 0 refills | Status: AC

## 2017-11-20 MED ORDER — AZELASTINE HCL 0.1 % NA SOLN: 2.0000 | Freq: Two times a day (BID) | NASAL | 5 refills | Status: DC | PRN

## 2017-11-20 NOTE — Progress Notes (Signed)
FOLLOW UP  Date of Service/Encounter:  11/20/17   Assessment:   Seasonal and perennial allergic rhinitis (weeds, grasses, indoor molds, dust mites, cat, dog and cockroach)  Chronic urticaria  Acute non-recurrent maxillary sinusitis    Christina [name] presents for follow-up visit.  Unfortunately, despite all of her avoidance measures and medications, she continues to have issues.  These seem to flare especially within the last 2 weeks during the height of the ragweed season.  She reports nasal congestion, sinus pressure, and overall fatigue.  She is on all of her medications, which she feels are also causing some sedative side effects.  We did discuss allergy shots today, and she is on board.  She will call her insurance company to check on any coverage issues.  We discussed that this is a long-term commitment, and we discussed the expectations and goals of treatment.  We are going to treat her current sinus infection with a Depo-Medrol injection today as well as a course of prednisone to help control inflammation.  I did provide her a prescription for amoxicillin in case this did not help over the next 24 to 48 hours.  I do not think that this is a bacterial infection, but she clearly wants to feel better for her trip this weekend.   Plan/Recommendations:   1. Seasonal and perennial allergic rhinitis (weeds, grasses, indoor molds, dust mites, cat, dog and cockroach) - Continue with: antihistamines daily (Zyrtec or cetirizine) and Nasacort (triamcinolone) one spray per nostril daily  - Add on Astelin (azelastine) two sprays per nostril up to twice daily.  - Check on the cost of the allergy shots and give [place] a call.  2. Sinusitis - With your current symptoms and time course, antibiotics might be needed: amoxicillin 875mg  twice daily for seven days (fill the prescription if there is no improvement in 1-2 days)  - DepoMedrol 40mg  given in clinic today.  - Start the steroid pack provided.  - Add on  nasal saline spray (i.e., Simply Saline) or nasal saline lavage (i.e., NeilMed) as needed prior to medicated nasal sprays. - For thick post nasal drainage, add guaifenesin [phone] mg (Mucinex) twice daily as needed for mucous thinning with adequate hydration to help it work.   3. Return in about 3 months (around 02/20/2018).  Subjective:   Christina [name] is a 57 y.o. female presenting today for follow up of  Chief Complaint  Patient presents with  . Sinus Problem    10 days  . Nasal Congestion  . Diarrhea    1 day    Christina [name] has a history of the following: Patient Active Problem List   Diagnosis Date Noted  . Right lateral epicondylitis 10/30/2017  . SI (sacroiliac) joint dysfunction 10/30/2017  . Nonallopathic lesion of sacral region 10/30/2017  . Nonallopathic lesion of lumbar region 10/30/2017  . Nonallopathic lesion of thoracic region 10/30/2017  . History of recurrent UTIs 04/11/2017  . 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 Allergic rhinitis 10/29/2016    History obtained from: chart review and patient.  [facility] Primary Care Provider is Panosh, Standley Brooking, MD.     Christina [name] is a 57 y.o. female presenting for a follow up visit.  She was last seen in August 2019 for evaluation of chronic urticaria.  She had no red flags in her history and we decided to hold off on labs.  We started triamcinolone 0.1% ointment daily as needed.  We also started Zyrtec 10 to 20 mg twice daily.  We did do allergy testing due to history of allergic rhinitis.  She was positive to weeds, grasses, molds, dust mite, cat, dog, and cockroach.  We recommended starting Nasacort 1 spray per nostril daily.  Since the last visit, she has continued to have problems. She reports that she is having problems with persistent rhinitis symptoms. She is on her cetirizine as well as her Zyrtec and Nasacort.  We never put her on a nasal antihistamine.  She does use nasal saline rinses.  She is also using Mucinex twice  daily.  She is doing all of the medications, but she cannot seem to feel better despite her best intentions.  She reports debilitating sinus pain.  She has not had a fever.  She is able to maintain hydration.  She has been spending more time outside doing yard work.  They are preparing to aerate her yard, so she has been picking up quite a few acorns in the yard.  She is very interested and options for long-term treatment and care.  She has lots of questions about allergen immunotherapy, and we spent a lot of time discussing this today.  She is can be traveling to [place] on Sunday with some of her girlfriends.  She would like to be able to feel better by that point.  Otherwise, there have been no changes to her past medical history, surgical history, family history, or social history.  She is very interested in moving somewhere else since her allergies are not under good control here.  However, most of her children are in the area so she is going to stay.  She does have one child who lives in [place] - a daughter - who specializes in cow nutrition.    Review of Systems: a 14-point review of systems is pertinent for what is mentioned in HPI.  Otherwise, all other systems were negative. Constitutional: negative other than that listed in the HPI Eyes: negative other than that listed in the HPI Ears, nose, mouth, throat, and face: negative other than that listed in the HPI Respiratory: negative other than that listed in the HPI Cardiovascular: negative other than that listed in the HPI Gastrointestinal: negative other than that listed in the HPI Genitourinary: negative other than that listed in the HPI Integument: negative other than that listed in the HPI Hematologic: negative other than that listed in the HPI Musculoskeletal: negative other than that listed in the HPI Neurological: negative other than that listed in the HPI Allergy/Immunologic: negative other than that listed in the  HPI    Objective:   Blood pressure 100/60, pulse 80, temperature 98.2 F (36.8 C), temperature source Oral, resp. rate 16, height 5\' 2"  (1.575 m), weight 144 lb 9.6 oz (65.6 kg), SpO2 97 %. Body mass index is 26.45 kg/m.   Physical Exam:  General: Alert, interactive, in no acute distress.  Clearly somewhat ill-appearing. Eyes: No conjunctival injection bilaterally, no discharge on the right, no discharge on the left, no Horner-Trantas dots present and allergic shiners present bilaterally. PERRL bilaterally. EOMI without pain. No photophobia.  Ears: Right TM pearly gray with normal light reflex, Left TM pearly gray with normal light reflex, Right TM intact without perforation and Left TM intact without perforation.  Nose/Throat: External nose within normal limits, nasal crease present and septum midline. Turbinates markedly edematous with clear discharge. Posterior oropharynx erythematous with cobblestoning in the posterior oropharynx. Tonsils 3+ without exudates.  Tongue without thrush. Bilateral sinus tenderness. No polyps.  Lungs: Clear to  auscultation without wheezing, rhonchi or rales. No increased work of breathing. CV: Normal S1/S2. No murmurs. Capillary refill <2 seconds.  Skin: Warm and dry, without lesions or rashes. Neuro:   Grossly intact. No focal deficits appreciated. Responsive to questions.  Diagnostic studies: none      Salvatore Marvel, MD  [facility] [place] [facility] [place]

## 2017-11-20 NOTE — Patient Instructions (Addendum)
1. Seasonal and perennial allergic rhinitis (weeds, grasses, indoor molds, dust mites, cat, dog and cockroach) - Continue with: antihistamines daily (Zyrtec or cetirizine) and Nasacort (triamcinolone) one spray per nostril daily  - Add on Astelin (azelastine) two sprays per nostril up to twice daily.  - Check on the cost of the allergy shots and give [place] a call.  2. Sinusitis - With your current symptoms and time course, antibiotics might be needed: amoxicillin 875mg  twice daily for seven days (fill the prescription if there is no improvement in 1-2 days)  - DepoMedrol 40mg  given in clinic today.  - Start the steroid pack provided.  - Add on nasal saline spray (i.e., Simply Saline) or nasal saline lavage (i.e., NeilMed) as needed prior to medicated nasal sprays. - For thick post nasal drainage, add guaifenesin [phone] mg (Mucinex) twice daily as needed for mucous thinning with adequate hydration to help it work.   3. Return in about 3 months (around 02/20/2018).   Please inform us of any Emergency Department visits, hospitalizations, or changes in symptoms. Call us before going to the ED for breathing or allergy symptoms since we might be able to fit you in for a sick visit. Feel free to contact us anytime with any questions, problems, or concerns.  It was a pleasure to see you again today! Have fun in [place]!   Websites that have reliable patient information: 1. American Academy of Asthma, Allergy, and Immunology: www.aaaai.org 2. Food Allergy Research and Education (FARE): foodallergy.org 3. Mothers of Asthmatics: http://www.asthmacommunitynetwork.org 4. American College of Allergy, Asthma, and Immunology: MonthlyElectricBill.co.uk   Make sure you are registered to vote! If you have moved or changed any of your contact information, you will need to get this updated before voting!        Reducing Pollen Exposure  The American Academy of Allergy, Asthma and Immunology suggests the following  steps to reduce your exposure to pollen during allergy seasons.    1. Do not hang sheets or clothing out to dry; pollen may collect on these items. 2. Do not mow lawns or spend time around freshly cut grass; mowing stirs up pollen. 3. Keep windows closed at night.  Keep car windows closed while driving. 4. Minimize morning activities outdoors, a time when pollen counts are usually at their highest. 5. Stay indoors as much as possible when pollen counts or humidity is high and on windy days when pollen tends to remain in the air longer. 6. Use air conditioning when possible.  Many air conditioners have filters that trap the pollen spores. 7. Use a HEPA room air filter to remove pollen form the indoor air you breathe.  Control of Mold Allergen   Mold and fungi can grow on a variety of surfaces provided certain temperature and moisture conditions exist.  Outdoor molds grow on plants, decaying vegetation and soil.  The major outdoor mold, Alternaria and Cladosporium, are found in very high numbers during hot and dry conditions.  Generally, a late Summer - Fall peak is seen for common outdoor fungal spores.  Rain will temporarily lower outdoor mold spore count, but counts rise rapidly when the rainy period ends.  The most important indoor molds are Aspergillus and Penicillium.  Dark, humid and poorly ventilated basements are ideal sites for mold growth.  The next most common sites of mold growth are the bathroom and the kitchen.    Indoor (Perennial) Mold Control   Positive indoor molds via skin testing: Fusarium, Aureobasidium ([place]ulara) and  Rhizopus  1. Maintain humidity below 50%. 2. Clean washable surfaces with 5% bleach solution. 3. Remove sources e.g. contaminated carpets.     Control of House Dust Mite Allergen    House dust mites play a major role in allergic asthma and rhinitis.  They occur in environments with high humidity wherever human skin, the food for dust mites is  found. High levels have been detected in dust obtained from mattresses, pillows, carpets, upholstered furniture, bed covers, clothes and soft toys.  The principal allergen of the house dust mite is found in its feces.  A gram of dust may contain 1,000 mites and 250,000 fecal particles.  Mite antigen is easily measured in the air during house cleaning activities.    1. Encase mattresses, including the box spring, and pillow, in an air tight cover.  Seal the zipper end of the encased mattresses with wide adhesive tape. 2. Wash the bedding in water of 130 degrees Farenheit weekly.  Avoid cotton comforters/quilts and flannel bedding: the most ideal bed covering is the dacron comforter. 3. Remove all upholstered furniture from the bedroom. 4. Remove carpets, carpet padding, rugs, and non-washable window drapes from the bedroom.  Wash drapes weekly or use plastic window coverings. 5. Remove all non-washable stuffed toys from the bedroom.  Wash stuffed toys weekly. 6. Have the room cleaned frequently with a vacuum cleaner and a damp dust-mop.  The patient should not be in a room which is being cleaned and should wait 1 hour after cleaning before going into the room. 7. Close and seal all heating outlets in the bedroom.  Otherwise, the room will become filled with dust-laden air.  An electric heater can be used to heat the room. 8. Reduce indoor humidity to less than 50%.  Do not use a humidifier.  Control of Dog or Cat Allergen  Avoidance is the best way to manage a dog or cat allergy. If you have a dog or cat and are allergic to dog or cats, consider removing the dog or cat from the home. If you have a dog or cat but don't want to find it a new home, or if your family wants a pet even though someone in the household is allergic, here are some strategies that may help keep symptoms at bay:  1. Keep the pet out of your bedroom and restrict it to only a few rooms. Be advised that keeping the dog or cat in  only one room will not limit the allergens to that room. 2. Don't pet, hug or kiss the dog or cat; if you do, wash your hands with soap and water. 3. High-efficiency particulate air (HEPA) cleaners run continuously in a bedroom or living room can reduce allergen levels over time. 4. Regular use of a high-efficiency vacuum cleaner or a central vacuum can reduce allergen levels. 5. Giving your dog or cat a bath at least once a week can reduce airborne allergen.  Control of Cockroach Allergen  Cockroach allergen has been identified as an important cause of acute attacks of asthma, especially in urban settings.  There are fifty-five species of cockroach that exist in the [place], however only three, the [place], [place] species produce allergen that can affect patients with Asthma.  Allergens can be obtained from fecal particles, egg casings and secretions from cockroaches.    1. Remove food sources. 2. Reduce access to water. 3. Seal access and entry points. 4. Spray runways with 0.5-1% Diazinon or Chlorpyrifos  5. Blow boric acid power under stoves and refrigerator. 6. Place bait stations (hydramethylnon) at feeding sites.  Allergy Shots   Allergies are the result of a chain reaction that starts in the immune system. Your immune system controls how your body defends itself. For instance, if you have an allergy to pollen, your immune system identifies pollen as an invader or allergen. Your immune system overreacts by producing antibodies called Immunoglobulin E (IgE). These antibodies travel to cells that release chemicals, causing an allergic reaction.  The concept behind allergy immunotherapy, whether it is received in the form of shots or tablets, is that the immune system can be desensitized to specific allergens that trigger allergy symptoms. Although it requires time and patience, the payback can be long-term relief.  How Do Allergy Shots Work?  Allergy shots work much  like a vaccine. Your body responds to injected amounts of a particular allergen given in increasing doses, eventually developing a resistance and tolerance to it. Allergy shots can lead to decreased, minimal or no allergy symptoms.  There generally are two phases: build-up and maintenance. Build-up often ranges from three to six months and involves receiving injections with increasing amounts of the allergens. The shots are typically given once or twice a week, though more rapid build-up schedules are sometimes used.  The maintenance phase begins when the most effective dose is reached. This dose is different for each person, depending on how allergic you are and your response to the build-up injections. Once the maintenance dose is reached, there are longer periods between injections, typically two to four weeks.  Occasionally doctors give cortisone-type shots that can temporarily reduce allergy symptoms. These types of shots are different and should not be confused with allergy immunotherapy shots.  Who Can Be Treated with Allergy Shots?  Allergy shots may be a good treatment approach for people with allergic rhinitis (hay fever), allergic asthma, conjunctivitis (eye allergy) or stinging insect allergy.   Before deciding to begin allergy shots, you should consider:  . The length of allergy season and the severity of your symptoms . Whether medications and/or changes to your environment can control your symptoms . Your desire to avoid long-term medication use . Time: allergy immunotherapy requires a major time commitment . Cost: may vary depending on your insurance coverage  Allergy shots for children age 74 and older are effective and often well tolerated. They might prevent the onset of new allergen sensitivities or the progression to asthma.  Allergy shots are not started on patients who are pregnant but can be continued on patients who become pregnant while receiving them. In some  patients with other medical conditions or who take certain common medications, allergy shots may be of risk. It is important to mention other medications you talk to your allergist.   When Will I Feel Better?  Some may experience decreased allergy symptoms during the build-up phase. For others, it may take as long as 12 months on the maintenance dose. If there is no improvement after a year of maintenance, your allergist will discuss other treatment options with you.  If you aren't responding to allergy shots, it may be because there is not enough dose of the allergen in your vaccine or there are missing allergens that were not identified during your allergy testing. Other reasons could be that there are high levels of the allergen in your environment or major exposure to non-allergic triggers like tobacco smoke.  What Is the Length of Treatment?  Once the  maintenance dose is reached, allergy shots are generally continued for three to five years. The decision to stop should be discussed with your allergist at that time. Some people may experience a permanent reduction of allergy symptoms. Others may relapse and a longer course of allergy shots can be considered.  What Are the Possible Reactions?  The two types of adverse reactions that can occur with allergy shots are local and systemic. Common local reactions include very mild redness and swelling at the injection site, which can happen immediately or several hours after. A systemic reaction, which is less common, affects the entire body or a particular body system. They are usually mild and typically respond quickly to medications. Signs include increased allergy symptoms such as sneezing, a stuffy nose or hives.  Rarely, a serious systemic reaction called anaphylaxis can develop. Symptoms include swelling in the throat, wheezing, a feeling of tightness in the chest, nausea or dizziness. Most serious systemic reactions develop within 30 minutes of  allergy shots. This is why it is strongly recommended you wait in your doctor's office for 30 minutes after your injections. Your allergist is trained to watch for reactions, and his or her staff is trained and equipped with the proper medications to identify and treat them.  Who Should Administer Allergy Shots?  The preferred location for receiving shots is your prescribing allergist's office. Injections can sometimes be given at another facility where the physician and staff are trained to recognize and treat reactions, and have received instructions by your prescribing allergist.

## 2017-11-25 NOTE — Progress Notes (Signed)
[name] [place], [place] [address] Phone: [phone] Subjective:   [name], am serving as a scribe for Dr. Hulan Saas.  I'm seeing this patient by the request  of:    CC: Bilateral elbow pain, back pain with radiation down the leg  ERX:[id number]  Christina [name] is a 57 y.o. female coming in with complaint of bilateral elbow pain. She was seen for right elbow pain but is now having left elbow pain over the lateral epicondyle.  Patient states that overall this is ongoing.  Notices it more with repetitive activity.  Does not seem to hurt her at rest.  Patient continues to be able to workout on a regular basis  Is having radiating symptoms going down front of left leg. Pain is intermittent but seems to be increasing.  Patient denies any weakness.  States that it is seems to be increasing in frequency but may be not severity.  Not doing the exercises regularly.      Past Medical History:  Diagnosis Date  . Arthritis   . Chicken pox    Past Surgical History:  Procedure Laterality Date  . CHOLECYSTECTOMY     Social History   Socioeconomic History  . Marital status: Married    Spouse name: Not on file  . Number of children: Not on file  . Years of education: Not on file  . Highest education level: Not on file  Occupational History  . Not on file  Social Needs  . Financial resource strain: Not on file  . Food insecurity:    Worry: Not on file    Inability: Not on file  . Transportation needs:    Medical: Not on file    Non-medical: Not on file  Tobacco Use  . Smoking status: Former Research scientist (life sciences)  . Smokeless tobacco: Never Used  Substance and Sexual Activity  . Alcohol use: Yes  . Drug use: No  . Sexual activity: Yes    Partners: Male  Lifestyle  . Physical activity:    Days per week: Not on file    Minutes per session: Not on file  . Stress: Not on file  Relationships  . Social connections:    Talks on phone: Not on  file    Gets together: Not on file    Attends religious service: Not on file    Active member of club or organization: Not on file    Attends meetings of clubs or organizations: Not on file    Relationship status: Not on file  Other Topics Concern  . Not on file  Social History Narrative  . Not on file   No Known Allergies Family History  Problem Relation Age of Onset  . Cancer Mother   . Hyperlipidemia Mother   . Hypertension Mother   . Stroke Mother   . Cancer Father   . Hypertension Maternal Grandmother   . Hypertension Paternal Grandmother   . Stroke Paternal Grandmother      Current Outpatient Medications (Cardiovascular):  .  losartan (COZAAR) 50 MG tablet, TAKE 1 TABLET BY MOUTH EVERY DAY  Current Outpatient Medications (Respiratory):  .  azelastine (ASTELIN) 0.1 % nasal spray, Place 2 sprays into both nostrils 2 (two) times daily as needed for rhinitis. Use in each nostril as directed .  cetirizine (ZYRTEC) 10 MG tablet, Take 1 tablet (10 mg total) by mouth daily. Marland Kitchen  triamcinolone (NASACORT) 55 MCG/ACT AERO nasal inhaler, Place 2  sprays into the nose daily.    Current Outpatient Medications (Other):  .  amoxicillin (AMOXIL) 875 MG tablet, Take 1 tablet (875 mg total) by mouth 2 (two) times daily for 7 days. .  Cholecalciferol (VITAMIN D PO), Take 1 tablet by mouth daily. .  Diclofenac Sodium 2 % SOLN, Place 2 g onto the skin 2 (two) times daily. .  Probiotic Product (PROBIOTIC PO), Take 1 tablet by mouth daily as needed. .  gabapentin (NEURONTIN) 100 MG capsule, Take 2 capsules (200 mg total) by mouth at bedtime.    Past medical history, social, surgical and family history all reviewed in electronic medical record.  No pertanent information unless stated regarding to the chief complaint.   Review of Systems:  No headache, visual changes, nausea, vomiting, diarrhea, constipation, dizziness, abdominal pain, skin rash, fevers, chills, night sweats, weight loss,  swollen lymph nodes, body aches, joint swelling,  chest pain, shortness of breath, mood changes.  Positive muscle aches  Objective  Blood pressure 110/88, pulse 82, height 5\' 2"  (1.575 m), weight 144 lb (65.3 kg), SpO2 98 %.    General: No apparent distress alert and oriented x3 mood and affect normal, dressed appropriately.  HEENT: Pupils equal, extraocular movements intact  Respiratory: Patient's speak in full sentences and does not appear short of breath  Cardiovascular: No lower extremity edema, non tender, no erythema  Skin: Warm dry intact with no signs of infection or rash on extremities or on axial skeleton.  Abdomen: Soft nontender  Neuro: Cranial nerves II through XII are intact, neurovascularly intact in all extremities with 2+ DTRs and 2+ pulses.  Lymph: No lymphadenopathy of posterior or anterior cervical chain or axillae bilaterally.  Gait normal with good balance and coordination.  MSK:  Non tender with full range of motion and good stability and symmetric strength and tone of shoulders,  wrist, hip, knee and ankles bilaterally.  Elbow: Bilateral Unremarkable to inspection. Range of motion full pronation, supination, flexion, extension. Strength is full to all of the above directions Stable to varus, valgus stress. Negative moving valgus stress test. Mild pain over the lateral epicondylar region bilaterally Ulnar nerve does not sublux. Negative cubital tunnel Tinel's. Neck: Inspection unremarkable. No palpable stepoffs. Negative Spurling's maneuver. Full neck range of motion Grip strength and sensation normal in bilateral hands Strength good C4 to T1 distribution No sensory change to C4 to T1 Negative Hoffman sign bilaterally Reflexes normal Mild tightness of the trapezius bilaterally  Back exam with some tenderness to palpation around the sacroiliac joints bilaterally.  Positive Faber test bilaterally.  Negative straight leg test.  Near full range of motion but  does have some mild loss of extension with some mild increase in discomfort.  Osteopathic findings T7 extended rotated and side bent left L2 flexed rotated and side bent right Sacrum left on left     Impression and Recommendations:     This case required medical decision making of moderate complexity. The above documentation has been reviewed and is accurate and complete Lyndal Pulley, DO       Note: This dictation was prepared with Dragon dictation along with smaller phrase technology. Any transcriptional errors that result from this process are unintentional.

## 2017-11-27 ENCOUNTER — Encounter: Payer: Self-pay | Admitting: Family Medicine

## 2017-11-27 ENCOUNTER — Ambulatory Visit: Payer: 59 | Admitting: Family Medicine

## 2017-11-27 ENCOUNTER — Ambulatory Visit (INDEPENDENT_AMBULATORY_CARE_PROVIDER_SITE_OTHER)
Admission: RE | Admit: 2017-11-27 | Discharge: 2017-11-27 | Disposition: A | Discharge status: Home / Self Care | Payer: 59 | Source: Ambulatory Visit | Attending: Family Medicine | Admitting: Family Medicine

## 2017-11-27 ENCOUNTER — Ambulatory Visit: Payer: Self-pay

## 2017-11-27 VITALS — BP 110/88 | HR 82 | Ht 62.0 in | Wt 144.0 lb

## 2017-11-27 DIAGNOSIS — M5416 Radiculopathy, lumbar region: Secondary | ICD-10-CM

## 2017-11-27 DIAGNOSIS — M999 Biomechanical lesion, unspecified: Secondary | ICD-10-CM

## 2017-11-27 DIAGNOSIS — M542 Cervicalgia: Secondary | ICD-10-CM | POA: Diagnosis not present

## 2017-11-27 DIAGNOSIS — M533 Sacrococcygeal disorders, not elsewhere classified: Secondary | ICD-10-CM

## 2017-11-27 DIAGNOSIS — M25522 Pain in left elbow: Secondary | ICD-10-CM

## 2017-11-27 MED ORDER — GABAPENTIN 100 MG PO CAPS: 200.0000 mg | ORAL_CAPSULE | Freq: Every day | ORAL | 3 refills | Status: DC

## 2017-11-27 NOTE — Assessment & Plan Note (Signed)
Sacroiliac dysfunction.  Discussed icing regimen and home exercise.  Discussed which activities to do which was to avoid.  Patient is to increase activity slowly over the course the next several days.  Patient did respond fairly well to manipulation.  Discussed posture and ergonomics.  X-rays ordered today noted due to continued pain.  Follow-up again in 4 weeks

## 2017-11-27 NOTE — Assessment & Plan Note (Addendum)
Decision today to treat with OMT was based on Physical Exam  After verbal consent patient was treated with HVLA, ME, FPR techniques in  thoracic, rib, lumbar and sacral areas  Patient tolerated the procedure well with improvement in symptoms  Patient given exercises, stretches and lifestyle modifications  See medications in patient instructions if given  Patient will follow up in 4-8 weeks

## 2017-11-27 NOTE — Patient Instructions (Signed)
Good to see you  Ice is your friend Xray downstairs Gabapentin 200mg  at night Tart cherry extract at night Can try the bee pollen and see  Stay active Xray downstairs Elbows should calm down and maybe wear at night more  See me again in 4-6 weeks

## 2017-12-04 ENCOUNTER — Ambulatory Visit (INDEPENDENT_AMBULATORY_CARE_PROVIDER_SITE_OTHER): Payer: 59 | Admitting: Family Medicine

## 2017-12-04 ENCOUNTER — Encounter: Payer: Self-pay | Admitting: Family Medicine

## 2017-12-04 VITALS — BP 138/80 | HR 75 | Temp 98.3°F | Ht 62.0 in | Wt 144.7 lb

## 2017-12-04 DIAGNOSIS — R300 Dysuria: Secondary | ICD-10-CM | POA: Diagnosis not present

## 2017-12-04 DIAGNOSIS — B372 Candidiasis of skin and nail: Secondary | ICD-10-CM

## 2017-12-04 DIAGNOSIS — N3000 Acute cystitis without hematuria: Secondary | ICD-10-CM | POA: Diagnosis not present

## 2017-12-04 LAB — POC URINALSYSI DIPSTICK (AUTOMATED)
Glucose, UA: NEGATIVE
Ketones, UA: NEGATIVE
NITRITE UA: POSITIVE
PH UA: 6.5 (ref 5.0–8.0)
Protein, UA: POSITIVE — AB
Spec Grav, UA: 1.02 (ref 1.010–1.025)
Urobilinogen, UA: 1 E.U./dL

## 2017-12-04 MED ORDER — FLUCONAZOLE 150 MG PO TABS: 150.0000 mg | ORAL_TABLET | Freq: Once | ORAL | 0 refills | Status: AC

## 2017-12-04 MED ORDER — NITROFURANTOIN MONOHYD MACRO 100 MG PO CAPS: 100.0000 mg | ORAL_CAPSULE | Freq: Two times a day (BID) | ORAL | 0 refills | Status: DC

## 2017-12-04 NOTE — Progress Notes (Signed)
  HPI:  Using dictation device. Unfortunately this device frequently misinterprets words/phrases.  Acute visit for Dysuria: -several days -frequency, urgency, dysuria -hx recurrent yeast infections as well and asks for diflucan with abx -no reported, fevers, vomiting, flank pain -reports chronic issues with urinary incontinence  ROS: See pertinent positives and negatives per HPI.  Past Medical History:  Diagnosis Date  . Arthritis   . Chicken pox     Past Surgical History:  Procedure Laterality Date  . CHOLECYSTECTOMY      Family History  Problem Relation Age of Onset  . Cancer Mother   . Hyperlipidemia Mother   . Hypertension Mother   . Stroke Mother   . Cancer Father   . Hypertension Maternal Grandmother   . Hypertension Paternal Grandmother   . Stroke Paternal Grandmother     SOCIAL HX: see hpi   Current Outpatient Medications:  .  azelastine (ASTELIN) 0.1 % nasal spray, Place 2 sprays into both nostrils 2 (two) times daily as needed for rhinitis. Use in each nostril as directed, Disp: 30 mL, Rfl: 5 .  cetirizine (ZYRTEC) 10 MG tablet, Take 1 tablet (10 mg total) by mouth daily., Disp: 30 tablet, Rfl: 5 .  Cholecalciferol (VITAMIN D PO), Take 1 tablet by mouth daily., Disp: , Rfl:  .  gabapentin (NEURONTIN) 100 MG capsule, Take 2 capsules (200 mg total) by mouth at bedtime., Disp: 60 capsule, Rfl: 3 .  losartan (COZAAR) 50 MG tablet, TAKE 1 TABLET BY MOUTH EVERY DAY, Disp: 90 tablet, Rfl: 0 .  Probiotic Product (PROBIOTIC PO), Take 1 tablet by mouth daily as needed., Disp: , Rfl:  .  triamcinolone (NASACORT) 55 MCG/ACT AERO nasal inhaler, Place 2 sprays into the nose daily., Disp: 1 Inhaler, Rfl: 5 .  fluconazole (DIFLUCAN) 150 MG tablet, Take 1 tablet (150 mg total) by mouth once for 1 dose. Repeat in 3 days if needed., Disp: 2 tablet, Rfl: 0 .  nitrofurantoin, macrocrystal-monohydrate, (MACROBID) 100 MG capsule, Take 1 capsule (100 mg total) by mouth 2 (two) times  daily., Disp: 14 capsule, Rfl: 0  EXAM:  Vitals:   12/04/17 0831  BP: 138/80  Pulse: 75  Temp: 98.3 F (36.8 C)    Body mass index is 26.47 kg/m.  GENERAL: vitals reviewed and listed above, alert, oriented, appears well hydrated and in no acute distress  HEENT: atraumatic, conjunttiva clear, no obvious abnormalities on inspection of external nose and ears  NECK: no obvious masses on inspection  LUNGS: clear to auscultation bilaterally, no wheezes, rales or rhonchi, good air movement  CV: HRRR, no peripheral edema  MS: moves all extremities without noticeable abnormality  ABD: BS+ ,soft, NTTP, no CVA TTP  PSYCH: pleasant and cooperative, no obvious depression or anxiety  ASSESSMENT AND PLAN:  Discussed the following assessment and plan:  Dysuria - Plan: POCT Urinalysis Dipstick (Automated)  Acute cystitis without hematuria  Yeast dermatitis  -udip + symptoms suggest UTI -discussed options/risks and will send macrobid pending culture -also sent diflucan -discussed methods for possible prevention and also kegels for chronic urinary incontinence issues -advised follow up with PCP if any persistent, new or worsening issues  There are no Patient Instructions on file for this visit.  Lucretia Kern, DO

## 2017-12-11 NOTE — Telephone Encounter (Signed)
Please advise Dr Panosh, thanks.   

## 2017-12-12 NOTE — Telephone Encounter (Signed)
No guarantees but can try myrbetric 50 mg  Per day  Disp 30

## 2017-12-16 MED ORDER — MIRABEGRON ER 25 MG PO TB24: 25.0000 mg | ORAL_TABLET | Freq: Every day | ORAL | 5 refills | Status: DC

## 2017-12-18 NOTE — Telephone Encounter (Signed)
Medication was sent

## 2017-12-19 NOTE — Telephone Encounter (Signed)
First I would look to see if there is a couping that make lower  copay   To see if affordable   ( ashtyn see if any coupons here)   Or can try another  But still could  Be expensive or have s/e  vesicare 5 mg per day disp 30 refill x 1

## 2017-12-24 MED ORDER — SOLIFENACIN SUCCINATE 5 MG PO TABS: 5.0000 mg | ORAL_TABLET | Freq: Every day | ORAL | 1 refills | Status: DC

## 2017-12-29 ENCOUNTER — Ambulatory Visit (INDEPENDENT_AMBULATORY_CARE_PROVIDER_SITE_OTHER): Payer: 59 | Admitting: Internal Medicine

## 2017-12-29 ENCOUNTER — Encounter: Payer: Self-pay | Admitting: Internal Medicine

## 2017-12-29 VITALS — BP 104/70 | HR 77 | Temp 98.1°F | Wt 146.4 lb

## 2017-12-29 DIAGNOSIS — Z87440 Personal history of urinary (tract) infections: Secondary | ICD-10-CM

## 2017-12-29 DIAGNOSIS — R300 Dysuria: Secondary | ICD-10-CM

## 2017-12-29 DIAGNOSIS — N3946 Mixed incontinence: Secondary | ICD-10-CM | POA: Diagnosis not present

## 2017-12-29 LAB — POCT URINALYSIS DIPSTICK
Bilirubin, UA: NEGATIVE
Glucose, UA: NEGATIVE
Ketones, UA: NEGATIVE
LEUKOCYTES UA: NEGATIVE
NITRITE UA: NEGATIVE
PROTEIN UA: NEGATIVE
RBC UA: NEGATIVE
SPEC GRAV UA: 1.01 (ref 1.010–1.025)
UROBILINOGEN UA: 1 U/dL
pH, UA: 6 (ref 5.0–8.0)

## 2017-12-29 MED ORDER — CEFDINIR 300 MG PO CAPS: 300.0000 mg | ORAL_CAPSULE | Freq: Two times a day (BID) | ORAL | 0 refills | Status: DC

## 2017-12-29 NOTE — Progress Notes (Signed)
Chief Complaint  Patient presents with  . Urinary Tract Infection    HPI: Christina [name] 57 y.o. come in for SDA  GU sx   hc of recurrent utis and also  Cystitis sx.   onset one day and used azo  This am   Feels like early uti   See past hx of recurrent uti and mixed incontinence sx    Had  PT but hasn't been doing exercises as planned but didn't seem to help her  Previously. Was supposed to have fu dr Louis Meckel for poss cytoscopy    ROS: See pertinent positives and negatives per HPI. No fever  Has allergies  That are difficult.   Past Medical History:  Diagnosis Date  . Arthritis   . Chicken pox     Family History  Problem Relation Age of Onset  . Cancer Mother   . Hyperlipidemia Mother   . Hypertension Mother   . Stroke Mother   . Cancer Father   . Hypertension Maternal Grandmother   . Hypertension Paternal Grandmother   . Stroke Paternal Grandmother     Social History   Socioeconomic History  . Marital status: Married    Spouse name: Not on file  . Number of children: Not on file  . Years of education: Not on file  . Highest education level: Not on file  Occupational History  . Not on file  Social Needs  . Financial resource strain: Not on file  . Food insecurity:    Worry: Not on file    Inability: Not on file  . Transportation needs:    Medical: Not on file    Non-medical: Not on file  Tobacco Use  . Smoking status: Former Research scientist (life sciences)  . Smokeless tobacco: Never Used  Substance and Sexual Activity  . Alcohol use: Yes  . Drug use: No  . Sexual activity: Yes    Partners: Male  Lifestyle  . Physical activity:    Days per week: Not on file    Minutes per session: Not on file  . Stress: Not on file  Relationships  . Social connections:    Talks on phone: Not on file    Gets together: Not on file    Attends religious service: Not on file    Active member of club or organization: Not on file    Attends meetings of clubs or organizations: Not on file   Relationship status: Not on file  Other Topics Concern  . Not on file  Social History Narrative  . Not on file    Outpatient Medications Prior to Visit  Medication Sig Dispense Refill  . azelastine (ASTELIN) 0.1 % nasal spray Place 2 sprays into both nostrils 2 (two) times daily as needed for rhinitis. Use in each nostril as directed 30 mL 5  . cetirizine (ZYRTEC) 10 MG tablet Take 1 tablet (10 mg total) by mouth daily. 30 tablet 5  . Cholecalciferol (VITAMIN D PO) Take 1 tablet by mouth daily.    Marland Kitchen gabapentin (NEURONTIN) 100 MG capsule Take 2 capsules (200 mg total) by mouth at bedtime. 60 capsule 3  . losartan (COZAAR) 50 MG tablet TAKE 1 TABLET BY MOUTH EVERY DAY 90 tablet 0  . Probiotic Product (PROBIOTIC PO) Take 1 tablet by mouth daily as needed.    . solifenacin (VESICARE) 5 MG tablet Take 1 tablet (5 mg total) by mouth daily. 30 tablet 1  . mirabegron ER (MYRBETRIQ) 25 MG TB24 tablet Take  1 tablet (25 mg total) by mouth daily. 30 tablet 5  . nitrofurantoin, macrocrystal-monohydrate, (MACROBID) 100 MG capsule Take 1 capsule (100 mg total) by mouth 2 (two) times daily. 14 capsule 0  . triamcinolone (NASACORT) 55 MCG/ACT AERO nasal inhaler Place 2 sprays into the nose daily. 1 Inhaler 5   No facility-administered medications prior to visit.    EXAM:  BP 104/70 (BP Location: Left Arm, Patient Position: Sitting, Cuff Size: Normal)   Pulse 77   Temp 98.1 F (36.7 C) (Oral)   Wt 146 lb 6.4 oz (66.4 kg)   SpO2 97%   BMI 26.78 kg/m   Body mass index is 26.78 kg/m.  GENERAL: vitals reviewed and listed above, alert, oriented, appears well hydrated and in no acute distress HEENT: atraumatic, conjunctiva  clear, no obvious abnormalities on inspection of external nose and ears MS: moves all extremities without noticeable focal  abnormality PSYCH: pleasant and cooperative, no obvious depression or anxiety  BP Readings from Last 3 Encounters:  12/29/17 104/70  12/04/17 138/80    11/27/17 110/88  ua  Beg  Last cx in sytem  Enterococcus responded to amox   ASSESSMENT AND PLAN:  Discussed the following assessment and plan:  Dysuria - Plan: POCT urinalysis dipstick, Culture, Urine, Ambulatory referral to Urology  Mixed incontinence - Plan: Ambulatory referral to Urology  History of recurrent UTIs - Plan: Ambulatory referral to Urology   Expectant management. Can add antibiotic if needed pending cx   reviewed note from [place] ? 2019 and fu that   Didn't happen  Will need a new referral   To go back  Next steps . (She feels her allergies are oppressive and trying non traditional  Interventions trying to avoid  Injections ) -Patient advised to return or notify health care team  if  new concerns arise. Total visit 42mins > 50% spent counseling and coordinating care as indicated in above note and in instructions to patient .   Patient Instructions  Will re refer to  Urology  Culture to be done . Can begin antibiotic  In interim if needed.     Standley Brooking. Neelah Mannings M.D.

## 2017-12-29 NOTE — Patient Instructions (Addendum)
Will re refer to  Urology  Culture to be done . Can begin antibiotic  In interim if needed.

## 2017-12-30 NOTE — Telephone Encounter (Signed)
Please advise Dr Panosh, thanks.   

## 2017-12-31 LAB — URINE CULTURE
MICRO NUMBER:: 91355321
SPECIMEN QUALITY:: ADEQUATE

## 2017-12-31 NOTE — Progress Notes (Signed)
[name] [place], [place] [address] Phone: [phone] Subjective:   Christina [name], am serving as a scribe for Dr. Hulan Saas.   CC: Left hip pain follow-up  [id number]  Christina [name] is a 57 y.o. female coming in with complaint of back and left hip pain. Tightness on the left side since last visit. Has not been yoga.  Patient was seen previously and was given injection of the side of the hip.  Worsening discomfort though now.  Feels that the injection did not help at all.  Does feel like her elbow has improved since last visit. Is not having pain with AROM in right elbow.  States that the elbow is 100%     Past Medical History:  Diagnosis Date  . Arthritis   . Chicken pox    Past Surgical History:  Procedure Laterality Date  . CHOLECYSTECTOMY     Social History   Socioeconomic History  . Marital status: Married    Spouse name: Not on file  . Number of children: Not on file  . Years of education: Not on file  . Highest education level: Not on file  Occupational History  . Not on file  Social Needs  . Financial resource strain: Not on file  . Food insecurity:    Worry: Not on file    Inability: Not on file  . Transportation needs:    Medical: Not on file    Non-medical: Not on file  Tobacco Use  . Smoking status: Former Research scientist (life sciences)  . Smokeless tobacco: Never Used  Substance and Sexual Activity  . Alcohol use: Yes  . Drug use: [name]  . Sexual activity: Yes    Partners: Male  Lifestyle  . Physical activity:    Days per week: Not on file    Minutes per session: Not on file  . Stress: Not on file  Relationships  . Social connections:    Talks on phone: Not on file    Gets together: Not on file    Attends religious service: Not on file    Active member of club or organization: Not on file    Attends meetings of clubs or organizations: Not on file    Relationship status: Not on file  Other Topics Concern  . Not on  file  Social History Narrative  . Not on file   [name] Known Allergies Family History  Problem Relation Age of Onset  . Cancer Mother   . Hyperlipidemia Mother   . Hypertension Mother   . Stroke Mother   . Cancer Father   . Hypertension Maternal Grandmother   . Hypertension Paternal Grandmother   . Stroke Paternal Grandmother      Current Outpatient Medications (Cardiovascular):  .  losartan (COZAAR) 50 MG tablet, TAKE 1 TABLET BY MOUTH EVERY DAY  Current Outpatient Medications (Respiratory):  .  cetirizine (ZYRTEC) 10 MG tablet, Take 1 tablet (10 mg total) by mouth daily. Marland Kitchen  azelastine (ASTELIN) 0.1 % nasal spray, Place 2 sprays into both nostrils 2 (two) times daily as needed for rhinitis. Use in each nostril as directed    Current Outpatient Medications (Other):  .  cefdinir (OMNICEF) 300 MG capsule, Take 1 capsule (300 mg total) by mouth 2 (two) times daily. .  Cholecalciferol (VITAMIN D PO), Take 1 tablet by mouth daily. Marland Kitchen  gabapentin (NEURONTIN) 100 MG capsule, Take 2 capsules (200 mg total) by mouth at bedtime. Marland Kitchen  Probiotic Product (PROBIOTIC PO), Take 1 tablet by mouth daily as needed. .  solifenacin (VESICARE) 5 MG tablet, Take 1 tablet (5 mg total) by mouth daily.    Past medical history, social, surgical and family history all reviewed in electronic medical record.  [name] pertanent information unless stated regarding to the chief complaint.   Review of Systems:  [name] headache, visual changes, nausea, vomiting, diarrhea, constipation, dizziness, abdominal pain, skin rash, fevers, chills, night sweats, weight loss, swollen lymph nodes, body aches, joint swelling,, chest pain, shortness of breath, mood changes.  Positive muscle aches  Objective  Blood pressure 118/82, pulse 74, height 5\' 2"  (1.575 m), weight 147 lb (66.7 kg), SpO2 98 %.    General: [name] apparent distress alert and oriented x3 mood and affect normal, dressed appropriately.  HEENT: Pupils equal, extraocular  movements intact  Respiratory: Patient's speak in full sentences and does not appear short of breath  Cardiovascular: [name] lower extremity edema, non tender, [name] erythema  Skin: Warm dry intact with [name] signs of infection or rash on extremities or on axial skeleton.  Abdomen: Soft nontender  Neuro: Cranial nerves II through XII are intact, neurovascularly intact in all extremities with 2+ DTRs and 2+ pulses.  Lymph: [name] lymphadenopathy of posterior or anterior cervical chain or axillae bilaterally.  Gait mild antalgic MSK:  Non tender with full range of motion and good stability and symmetric strength and tone of shoulders, elbows, wrist, hip, knee and ankles bilaterally.  Back exam has loss of lordosis.  Tenderness to palpation more in the paraspinal musculature on the left side of the lumbar spine.  More over the left sacroiliac joint.  Positive Faber's test.  Negative straight leg test.  5 out of 5 strength of lower extremities  Osteopathic findings C2 flexed rotated and side bent right T3 extended rotated and side bent right inhaled third rib L2 flexed rotated and side bent right Sacrum left on left     Impression and Recommendations:     This case required medical decision making of moderate complexity. The above documentation has been reviewed and is accurate and complete Lyndal Pulley, DO       Note: This dictation was prepared with Dragon dictation along with smaller phrase technology. Any transcriptional errors that result from this process are unintentional.

## 2017-12-31 NOTE — Progress Notes (Signed)
urine culture shows e coli  sensitive to medication given . Should resolve with current treatment .  I advise going ahead and taking the medication

## 2018-01-01 ENCOUNTER — Ambulatory Visit: Payer: 59 | Admitting: Family Medicine

## 2018-01-01 ENCOUNTER — Encounter: Payer: Self-pay | Admitting: Family Medicine

## 2018-01-01 VITALS — BP 118/82 | HR 74 | Ht 62.0 in | Wt 147.0 lb

## 2018-01-01 DIAGNOSIS — M9983 Other biomechanical lesions of lumbar region: Secondary | ICD-10-CM

## 2018-01-01 DIAGNOSIS — M999 Biomechanical lesion, unspecified: Secondary | ICD-10-CM

## 2018-01-01 DIAGNOSIS — M533 Sacrococcygeal disorders, not elsewhere classified: Secondary | ICD-10-CM | POA: Diagnosis not present

## 2018-01-01 NOTE — Assessment & Plan Note (Signed)
Decision today to treat with OMT was based on Physical Exam  After verbal consent patient was treated with HVLA, ME, FPR techniques in cervical, thoracic, lumbar and sacral areas  Patient tolerated the procedure well with improvement in symptoms  Patient given exercises, stretches and lifestyle modifications  See medications in patient instructions if given  Patient will follow up in 4-8 weeks 

## 2018-01-01 NOTE — Assessment & Plan Note (Signed)
Continues to have discomfort and pain.  Patient has had further work-up previously but seems to be doing well.  Just has not been doing the exercises regularly.  Does have moderate arthritis that could be exacerbated by the cold weather.  Patient did not want to increase the gabapentin at this time.  Discussed icing regimen and home exercises.  Discussed core strengthening and weight loss.  Follow-up again with me 4 to 8 weeks

## 2018-01-01 NOTE — Patient Instructions (Addendum)
Good to see you  Maybe on 27th 745 for your so if you want Keep working on the back  Vitamin C 500mg  daily to see  I hope we resolve some of those issues Find some tim e for yourself  See me again in 6 weeks

## 2018-01-01 NOTE — Telephone Encounter (Signed)
Notes recorded by Burnis Medin, MD on 12/31/2017 at 2:56 PM EST urine culture shows e coli sensitive to medication given . Should resolve with current treatment . I advise going ahead and taking the medication

## 2018-01-02 NOTE — Telephone Encounter (Signed)
Please advise Dr Panosh, thanks.   

## 2018-01-05 ENCOUNTER — Encounter: Payer: Self-pay | Admitting: Family Medicine

## 2018-01-29 ENCOUNTER — Encounter: Payer: Self-pay | Admitting: Family Medicine

## 2018-01-29 ENCOUNTER — Ambulatory Visit: Payer: 59 | Admitting: Family Medicine

## 2018-01-29 VITALS — BP 120/82 | HR 88 | Ht 62.0 in | Wt 146.0 lb

## 2018-01-29 DIAGNOSIS — M533 Sacrococcygeal disorders, not elsewhere classified: Secondary | ICD-10-CM

## 2018-01-29 DIAGNOSIS — M999 Biomechanical lesion, unspecified: Secondary | ICD-10-CM

## 2018-01-29 NOTE — Assessment & Plan Note (Signed)
Decision today to treat with OMT was based on Physical Exam  After verbal consent patient was treated with HVLA, ME, FPR techniques in , thoracic, lumbar and sacral areas  Patient tolerated the procedure well with improvement in symptoms  Patient given exercises, stretches and lifestyle modifications  See medications in patient instructions if given  Patient will follow up in 4-8 weeks 

## 2018-01-29 NOTE — Assessment & Plan Note (Signed)
Continues have difficulty with the sacroiliac joint.  Seems to be more left-sided.  Discussed the possibility of possible injection.  Patient declined this.  Discussed icing regimen and home exercise.  Discussed which activities to do which wants to avoid.  Increase activity slowly.  Follow-up with me again in 4 to 8 weeks

## 2018-01-29 NOTE — Progress Notes (Signed)
[facility] [place], [place] [address] Phone: [phone] Subjective:   Christina [name], am serving as a scribe for Dr. Hulan Saas.   CC: back and hip pain   [id number]  Christina [name] is a 57 y.o. female coming in with complaint of back and hip pain. Stopped taking gabapentin. Was making patient sleepy. Has not had shooting pain at night. Is now using IBU. Does have pain on left SI joint/glute following her workouts.       Past Medical History:  Diagnosis Date  . Arthritis   . Chicken pox    Past Surgical History:  Procedure Laterality Date  . CHOLECYSTECTOMY     Social History   Socioeconomic History  . Marital status: Married    Spouse name: Not on file  . Number of children: Not on file  . Years of education: Not on file  . Highest education level: Not on file  Occupational History  . Not on file  Social Needs  . Financial resource strain: Not on file  . Food insecurity:    Worry: Not on file    Inability: Not on file  . Transportation needs:    Medical: Not on file    Non-medical: Not on file  Tobacco Use  . Smoking status: Former Research scientist (life sciences)  . Smokeless tobacco: Never Used  Substance and Sexual Activity  . Alcohol use: Yes  . Drug use: [name]  . Sexual activity: Yes    Partners: Male  Lifestyle  . Physical activity:    Days per week: Not on file    Minutes per session: Not on file  . Stress: Not on file  Relationships  . Social connections:    Talks on phone: Not on file    Gets together: Not on file    Attends religious service: Not on file    Active member of club or organization: Not on file    Attends meetings of clubs or organizations: Not on file    Relationship status: Not on file  Other Topics Concern  . Not on file  Social History Narrative  . Not on file   [name] Known Allergies Family History  Problem Relation Age of Onset  . Cancer Mother   . Hyperlipidemia Mother   . Hypertension Mother   .  Stroke Mother   . Cancer Father   . Hypertension Maternal Grandmother   . Hypertension Paternal Grandmother   . Stroke Paternal Grandmother      Current Outpatient Medications (Cardiovascular):  .  losartan (COZAAR) 50 MG tablet, TAKE 1 TABLET BY MOUTH EVERY DAY  Current Outpatient Medications (Respiratory):  .  cetirizine (ZYRTEC) 10 MG tablet, Take 1 tablet (10 mg total) by mouth daily. Marland Kitchen  azelastine (ASTELIN) 0.1 % nasal spray, Place 2 sprays into both nostrils 2 (two) times daily as needed for rhinitis. Use in each nostril as directed    Current Outpatient Medications (Other):  .  cefdinir (OMNICEF) 300 MG capsule, Take 1 capsule (300 mg total) by mouth 2 (two) times daily. .  Cholecalciferol (VITAMIN D PO), Take 1 tablet by mouth daily. Marland Kitchen  gabapentin (NEURONTIN) 100 MG capsule, Take 2 capsules (200 mg total) by mouth at bedtime. .  Probiotic Product (PROBIOTIC PO), Take 1 tablet by mouth daily as needed. .  solifenacin (VESICARE) 5 MG tablet, Take 1 tablet (5 mg total) by mouth daily.    Past medical history, social, surgical and family history  all reviewed in electronic medical record.  [name] pertanent information unless stated regarding to the chief complaint.   Review of Systems:  [name] headache, visual changes, nausea, vomiting, diarrhea, constipation, dizziness, abdominal pain, skin rash, fevers, chills, night sweats, weight loss, swollen lymph nodes, body aches, joint swelling,  chest pain, shortness of breath, mood changes.  Positive muscle aches  Objective  Blood pressure 120/82, pulse 88, height 5\' 2"  (1.575 m), weight 146 lb (66.2 kg), SpO2 97 %.    General: [name] apparent distress alert and oriented x3 mood and affect normal, dressed appropriately.  HEENT: Pupils equal, extraocular movements intact  Respiratory: Patient's speak in full sentences and does not appear short of breath  Cardiovascular: [name] lower extremity edema, non tender, [name] erythema  Skin: Warm dry intact  with [name] signs of infection or rash on extremities or on axial skeleton.  Abdomen: Soft nontender  Neuro: Cranial nerves II through XII are intact, neurovascularly intact in all extremities with 2+ DTRs and 2+ pulses.  Lymph: [name] lymphadenopathy of posterior or anterior cervical chain or axillae bilaterally.  Gait antalgic MSK:  Non tender with full range of motion and good stability and symmetric strength and tone of shoulders, elbows, wrist, hip, knee and ankles bilaterally.   Back Exam:  Inspection: Unremarkable  Motion: Flexion 45 deg, Extension 25 deg, Side Bending to 35 deg bilaterally, Rotation to 35 deg bilaterally  SLR laying: Negative  XSLR laying: Negative  Palpable tenderness: Tenderness to palpation more in the left sacroiliac joint and the left piriformis. FABER: Positive Faber. Sensory change: Gross sensation intact to all lumbar and sacral dermatomes.  Reflexes: 2+ at both patellar tendons, 2+ at achilles tendons, Babinski's downgoing.  Strength at foot  Plantar-flexion: 5/5 Dorsi-flexion: 5/5 Eversion: 5/5 Inversion: 5/5  Leg strength  Quad: 5/5 Hamstring: 5/5 Hip flexor: 5/5 Hip abductors: 4/5    Osteopathic findings C4 flexed rotated and side bent left  T5 extended rotated and side bent left L2 flexed rotated and side bent right Sacrum left left      Impression and Recommendations:     This case required medical decision making of moderate complexity. The above documentation has been reviewed and is accurate and complete Lyndal Pulley, DO       Note: This dictation was prepared with Dragon dictation along with smaller phrase technology. Any transcriptional errors that result from this process are unintentional.

## 2018-01-29 NOTE — Patient Instructions (Signed)
Good to see you  Calcium pyruvate 1500mg  daily  Stay active Ice is your friend Divide up upper body and lwer body on lifting days See me again in 5-6 weeks

## 2018-02-04 ENCOUNTER — Encounter: Payer: Self-pay | Admitting: Allergy & Immunology

## 2018-02-16 ENCOUNTER — Other Ambulatory Visit: Payer: Self-pay | Admitting: Family Medicine

## 2018-02-16 ENCOUNTER — Other Ambulatory Visit: Payer: Self-pay | Admitting: Internal Medicine

## 2018-02-16 DIAGNOSIS — I10 Essential (primary) hypertension: Secondary | ICD-10-CM

## 2018-02-16 MED ORDER — LOSARTAN POTASSIUM 50 MG PO TABS: 50.0000 mg | ORAL_TABLET | Freq: Every day | ORAL | 0 refills | Status: DC

## 2018-02-16 NOTE — Addendum Note (Signed)
Addended by: Zacarias Pontes on: 02/16/2018 07:55 AM   Modules accepted: Orders

## 2018-03-05 ENCOUNTER — Ambulatory Visit: Payer: 59 | Admitting: Family Medicine

## 2018-03-05 VITALS — BP 150/94 | HR 73 | Ht 62.0 in | Wt 148.0 lb

## 2018-03-05 DIAGNOSIS — M549 Dorsalgia, unspecified: Secondary | ICD-10-CM

## 2018-03-05 DIAGNOSIS — Z87440 Personal history of urinary (tract) infections: Secondary | ICD-10-CM | POA: Diagnosis not present

## 2018-03-05 DIAGNOSIS — G8929 Other chronic pain: Secondary | ICD-10-CM | POA: Diagnosis not present

## 2018-03-05 DIAGNOSIS — M5416 Radiculopathy, lumbar region: Secondary | ICD-10-CM | POA: Diagnosis not present

## 2018-03-05 MED ORDER — DIAZEPAM 5 MG PO TABS: ORAL_TABLET | ORAL | 0 refills | Status: DC

## 2018-03-05 NOTE — Patient Instructions (Addendum)
Good to see you  Ice is your friend I am sorry you are hurting We will get MRI of your back and pelvis and call [phone] to speed up the scheduling.  I will write you with results Pick up the valium as well from your pharmacy  I am here if you have questions

## 2018-03-05 NOTE — Assessment & Plan Note (Signed)
worsening symptoms new onset of weakness, pain also out of proportion to exam to palpation.  Patient is truly concern, failed all conservative therapy, xray shows DDD. Concern for nerve involvement with claudication. Need MRI to further evaluate, Also recent recurrent UTI with E coli. Will get MRI pelvis as well to make sure no uro gyn difficulties contributing. RTC after imaging.

## 2018-03-05 NOTE — Progress Notes (Signed)
[facility] [address], [place] [address] Phone: [phone] Subjective:    I Kandace Blitz am serving as a Education administrator for Dr. Hulan Saas.    CC: Neck pain and back pain follow-up  [id number]  Christina [name] is a 58 y.o. female coming in with complaint of hip pain. Has not made any progress and states that she is frustrated. States her pain is daily. Pain getting worse. States that she is doing everything Dr. Tamala Julian told her to do.  Worsening pain, only able to ADL with out pain. If she tries to do Yoga she is unable to do without pain. Feels she is worsening over time and not making progress.      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 Arthritis   . Chicken pox    Past Surgical History:  Procedure Laterality Date  . CHOLECYSTECTOMY     Social History   Socioeconomic History  . Marital status: Married    Spouse name: Not on file  . Number of children: Not on file  . Years of education: Not on file  . Highest education level: Not on file  Occupational History  . Not on file  Social Needs  . Financial resource strain: Not on file  . Food insecurity:    Worry: Not on file    Inability: Not on file  . Transportation needs:    Medical: Not on file    Non-medical: Not on file  Tobacco Use  . Smoking status: Former Research scientist (life sciences)  . Smokeless tobacco: Never Used  Substance and Sexual Activity  . Alcohol use: Yes  . Drug use: No  . Sexual activity: Yes    Partners: Male  Lifestyle  . Physical activity:    Days per week: Not on file    Minutes per session: Not on file  . Stress: Not on file  Relationships  . Social connections:    Talks on phone: Not on file    Gets together: Not on file    Attends religious service: Not on file    Active member of club or organization: Not on file   Attends meetings of clubs or organizations: Not on file    Relationship status: Not on file  Other Topics Concern  . Not on file  Social History Narrative  . Not on file   No Known Allergies Family History  Problem Relation Age of Onset  . Cancer Mother   . Hyperlipidemia Mother   . Hypertension Mother   . Stroke Mother   . Cancer Father   . Hypertension Maternal Grandmother   . Hypertension Paternal Grandmother   . Stroke Paternal Grandmother      Current Outpatient Medications (Cardiovascular):  .  losartan (COZAAR) 50 MG tablet, Take 1 tablet (50 mg total) by mouth daily.  Current Outpatient Medications (Respiratory):  .  cetirizine (ZYRTEC) 10 MG tablet, Take 1 tablet (10 mg total) by mouth daily. Marland Kitchen  azelastine (ASTELIN) 0.1 % nasal spray, Place 2 sprays into both nostrils 2 (two) times daily as needed for rhinitis. Use in each nostril as directed    Current Outpatient Medications (Other):  .  cefdinir (OMNICEF) 300 MG capsule, Take 1 capsule (300 mg total) by mouth 2 (two) times daily. .  Cholecalciferol (VITAMIN D PO), Take 1 tablet by  mouth daily. Marland Kitchen  gabapentin (NEURONTIN) 100 MG capsule, Take 2 capsules (200 mg total) by mouth at bedtime. .  Probiotic Product (PROBIOTIC PO), Take 1 tablet by mouth daily as needed. .  solifenacin (VESICARE) 5 MG tablet, TAKE 1 TABLET(5 MG) BY MOUTH DAILY    Past medical history, social, surgical and family history all reviewed in electronic medical record.  No pertanent information unless stated regarding to the chief complaint.   Review of Systems:  No headache, visual changes, nausea, vomiting, diarrhea, constipation, dizziness, abdominal pain, skin rash, fevers, chills, night sweats, weight loss, swollen lymph nodes, body aches, joint swelling, muscle aches, chest pain, shortness of breath, mood changes. Positive muscle aches and body aches. +dysuria intermittent.   Objective  Blood pressure (!) 150/94, pulse 73, height 5\' 2"   (1.575 m), weight 148 lb (67.1 kg), SpO2 98 %.     General: No apparent distress alert and oriented x3 mood and affect normal, dressed appropriately.  HEENT: Pupils equal, extraocular movements intact  Respiratory: Patient's speak in full sentences and does not appear short of breath  Cardiovascular: No lower extremity edema, non tender, no erythema  Skin: Warm dry intact with no signs of infection or rash on extremities or on axial skeleton.  Abdomen: Soft nontender  Neuro: Cranial nerves II through XII are intact, neurovascularly intact in all extremities with  and 2+ pulses.  Lymph: No lymphadenopathy of posterior or anterior cervical chain or axillae bilaterally.  Gait antalgic new finding.  MSK:  tender with full range of motion and good stability and symmetric strength and tone of shoulders, elbows, wrist, hip, knee and ankles bilaterally.    Back - Normal skin, Spine with normal alignment and no deformity.  No tenderness to vertebral process palpation.  Paraspinous increase TTP in paraspinal musculature of lumbar spine. Positive SLT on right side at 20 degrees. 1+ achilles DTR on right new finding.       Impression and Recommendations:     This case required medical decision making of moderate complexity. The above documentation has been reviewed and is accurate and complete Lyndal Pulley, DO       Note: This dictation was prepared with Dragon dictation along with smaller phrase technology. Any transcriptional errors that result from this process are unintentional.

## 2018-03-11 ENCOUNTER — Encounter: Payer: Self-pay | Admitting: Family Medicine

## 2018-03-11 NOTE — Telephone Encounter (Signed)
astyn can you search record for her last colon   May be in scanned documents  Usually due 10 years if had one at 59 then due at 26  And can do referral

## 2018-03-17 ENCOUNTER — Ambulatory Visit
Admission: RE | Admit: 2018-03-17 | Discharge: 2018-03-17 | Disposition: A | Discharge status: Home / Self Care | Payer: 59 | Source: Ambulatory Visit | Attending: Family Medicine | Admitting: Family Medicine

## 2018-03-17 ENCOUNTER — Other Ambulatory Visit: Payer: 59

## 2018-03-17 DIAGNOSIS — M549 Dorsalgia, unspecified: Secondary | ICD-10-CM

## 2018-03-17 DIAGNOSIS — G8929 Other chronic pain: Secondary | ICD-10-CM

## 2018-03-18 ENCOUNTER — Encounter: Payer: Self-pay | Admitting: Family Medicine

## 2018-03-18 ENCOUNTER — Other Ambulatory Visit: Payer: Self-pay

## 2018-03-18 DIAGNOSIS — M5416 Radiculopathy, lumbar region: Secondary | ICD-10-CM

## 2018-03-24 ENCOUNTER — Ambulatory Visit
Admission: RE | Admit: 2018-03-24 | Discharge: 2018-03-24 | Disposition: A | Discharge status: Home / Self Care | Payer: 59 | Source: Ambulatory Visit | Attending: Family Medicine | Admitting: Family Medicine

## 2018-03-24 DIAGNOSIS — M5416 Radiculopathy, lumbar region: Secondary | ICD-10-CM

## 2018-03-24 MED ORDER — IOPAMIDOL (ISOVUE-M 200) INJECTION 41%
1.0000 mL | Freq: Once | INTRAMUSCULAR | Status: AC
  Administered 2018-03-24: 1 mL via EPIDURAL

## 2018-03-24 MED ORDER — METHYLPREDNISOLONE ACETATE 40 MG/ML INJ SUSP (RADIOLOG
120.0000 mg | Freq: Once | INTRAMUSCULAR | Status: AC
  Administered 2018-03-24: 120 mg via EPIDURAL

## 2018-03-24 NOTE — Discharge Instructions (Signed)

## 2018-03-25 NOTE — Telephone Encounter (Signed)
M,adison I dont see colon easily in record under media   Please  See if you  Or asthyn can find the colon  report  To be able to answer her ?  thanks

## 2018-04-01 ENCOUNTER — Other Ambulatory Visit: Payer: Self-pay | Admitting: Internal Medicine

## 2018-04-08 ENCOUNTER — Ambulatory Visit: Payer: 59 | Admitting: Family Medicine

## 2018-04-08 ENCOUNTER — Encounter: Payer: Self-pay | Admitting: Family Medicine

## 2018-04-08 DIAGNOSIS — M5416 Radiculopathy, lumbar region: Secondary | ICD-10-CM

## 2018-04-08 NOTE — Assessment & Plan Note (Signed)
Responded well to the epidural.  Discussed icing regimen and home exercises.  Patient given return to gym progression.  Continue gabapentin as needed.  Follow-up again in 4 weeks and restart manipulation.

## 2018-04-08 NOTE — Patient Instructions (Signed)
Get back at it  See me again in 4 weeks

## 2018-04-08 NOTE — Progress Notes (Signed)
[facility] [address], [place] [address] Phone: [phone] Subjective:    I Christina [name] am serving as a Education administrator for Dr. Hulan Saas.   CC: Back pain follow-up  [id number]    Christina [name] is a 58 y.o. female coming in with complaint of back pain. States that she is doing. Has been exercising.  Patient is feeling better overall.  Also has a pelvic sling surgery.  Patient did have some small moderate arthritic changes of the back as well as some spinal stenosis that did respond well to the epidural.  Patient feels that only some mild discomfort but no nausea radicular symptoms at the moment.     Past Medical History:  Diagnosis Date  . Arthritis   . Chicken pox    Past Surgical History:  Procedure Laterality Date  . CHOLECYSTECTOMY     Social History   Socioeconomic History  . Marital status: Married    Spouse name: Not on file  . Number of children: Not on file  . Years of education: Not on file  . Highest education level: Not on file  Occupational History  . Not on file  Social Needs  . Financial resource strain: Not on file  . Food insecurity:    Worry: Not on file    Inability: Not on file  . Transportation needs:    Medical: Not on file    Non-medical: Not on file  Tobacco Use  . Smoking status: Former Research scientist (life sciences)  . Smokeless tobacco: Never Used  Substance and Sexual Activity  . Alcohol use: Yes  . Drug use: No  . Sexual activity: Yes    Partners: Male  Lifestyle  . Physical activity:    Days per week: Not on file    Minutes per session: Not on file  . Stress: Not on file  Relationships  . Social connections:    Talks on phone: Not on file    Gets together: Not on file    Attends religious service: Not on file    Active member of club or organization: Not on file    Attends meetings of clubs or organizations: Not on file    Relationship status: Not on file  Other Topics Concern  . Not on file  Social  History Narrative  . Not on file   No Known Allergies Family History  Problem Relation Age of Onset  . Cancer Mother   . Hyperlipidemia Mother   . Hypertension Mother   . Stroke Mother   . Cancer Father   . Hypertension Maternal Grandmother   . Hypertension Paternal Grandmother   . Stroke Paternal Grandmother      Current Outpatient Medications (Cardiovascular):  .  losartan (COZAAR) 50 MG tablet, Take 1 tablet (50 mg total) by mouth daily.  Current Outpatient Medications (Respiratory):  .  cetirizine (ZYRTEC) 10 MG tablet, Take 1 tablet (10 mg total) by mouth daily. Marland Kitchen  azelastine (ASTELIN) 0.1 % nasal spray, Place 2 sprays into both nostrils 2 (two) times daily as needed for rhinitis. Use in each nostril as directed    Current Outpatient Medications (Other):  .  cefdinir (OMNICEF) 300 MG capsule, Take 1 capsule (300 mg total) by mouth 2 (two) times daily. .  Cholecalciferol (VITAMIN D PO), Take 1 tablet by mouth daily. .  diazepam (VALIUM) 5 MG tablet, One tab by mouth, 2 hours before procedure. .  gabapentin (NEURONTIN) 100 MG capsule, Take 2 capsules (  200 mg total) by mouth at bedtime. .  Probiotic Product (PROBIOTIC PO), Take 1 tablet by mouth daily as needed. .  solifenacin (VESICARE) 5 MG tablet, TAKE 1 TABLET(5 MG) BY MOUTH DAILY    Past medical history, social, surgical and family history all reviewed in electronic medical record.  No pertanent information unless stated regarding to the chief complaint.   Review of Systems:  No headache, visual changes, nausea, vomiting, diarrhea, constipation, dizziness, abdominal pain, skin rash, fevers, chills, night sweats, weight loss, swollen lymph nodes, body aches, joint swelling, muscle aches, chest pain, shortness of breath, mood changes.   Objective  Blood pressure 130/86, pulse 82, height 5\' 2"  (1.575 m), weight 147 lb (66.7 kg), SpO2 98 %.   General: No apparent distress alert and oriented x3 mood and affect normal,  dressed appropriately.  HEENT: Pupils equal, extraocular movements intact  Respiratory: Patient's speak in full sentences and does not appear short of breath  Cardiovascular: No lower extremity edema, non tender, no erythema  Skin: Warm dry intact with no signs of infection or rash on extremities or on axial skeleton.  Abdomen: Soft nontender  Neuro: Cranial nerves II through XII are intact, neurovascularly intact in all extremities with 2+ DTRs and 2+ pulses.  Lymph: No lymphadenopathy of posterior or anterior cervical chain or axillae bilaterally.  Gait normal with good balance and coordination.  MSK:  Non tender with full range of motion and good stability and symmetric strength and tone of shoulders, elbows, wrist, hip, knee and ankles bilaterally.  Back Exam:  Inspection: Loss of lordosis Motion: Flexion 45 deg, Extension 25 deg, Side Bending to 35 deg bilaterally,  Rotation to 45 deg bilaterally  SLR laying: Negative  XSLR laying: Negative  Palpable tenderness: Mild tenderness in the L5-S1 paraspinal musculature of the lumbar spine bilaterally.Marland Kitchen FABER: negative. Sensory change: Gross sensation intact to all lumbar and sacral dermatomes.  Reflexes: 2+ at both patellar tendons, 2+ at achilles tendons, Babinski's downgoing.  Strength at foot  Plantar-flexion: 5/5 Dorsi-flexion: 5/5 Eversion: 5/5 Inversion: 5/5  Leg strength  Quad: 5/5 Hamstring: 5/5 Hip flexor: 5/5 Hip abductors: 5/5      Impression and Recommendations:    . The above documentation has been reviewed and is accurate and complete Lyndal Pulley, DO       Note: This dictation was prepared with Dragon dictation along with smaller phrase technology. Any transcriptional errors that result from this process are unintentional.

## 2018-04-16 ENCOUNTER — Encounter: Payer: Self-pay | Admitting: Internal Medicine

## 2018-04-27 ENCOUNTER — Encounter: Payer: Self-pay | Admitting: Internal Medicine

## 2018-04-28 NOTE — Telephone Encounter (Signed)
Madison Since her last colon was 2014 please find the scanned document and make sure it says  10 year fu since I cant see it in the ehr.

## 2018-04-29 NOTE — Telephone Encounter (Signed)
Tell patient status of situation  I cannot tell when she is due x  For l date of last testing  = refer her  To Gi for colonscopy    And then update any info that is on the  Report  As  Possible when scan comes in.

## 2018-05-04 ENCOUNTER — Encounter: Payer: Self-pay | Admitting: Allergy & Immunology

## 2018-05-04 MED ORDER — PREDNISONE 10 MG PO TABS: ORAL_TABLET | ORAL | 0 refills | Status: DC

## 2018-05-06 MED ORDER — AMOXICILLIN-POT CLAVULANATE 875-125 MG PO TABS: 1.0000 | ORAL_TABLET | Freq: Two times a day (BID) | ORAL | 0 refills | Status: AC

## 2018-05-06 NOTE — Addendum Note (Signed)
Addended by: Valentina Shaggy on: 05/06/2018 08:58 AM   Modules accepted: Orders

## 2018-05-07 ENCOUNTER — Ambulatory Visit: Payer: 59 | Admitting: Allergy & Immunology

## 2018-05-13 ENCOUNTER — Other Ambulatory Visit: Payer: Self-pay | Admitting: Family Medicine

## 2018-05-13 DIAGNOSIS — I10 Essential (primary) hypertension: Secondary | ICD-10-CM

## 2018-06-01 MED ORDER — FLUCONAZOLE 150 MG PO TABS: ORAL_TABLET | ORAL | 0 refills | Status: DC

## 2018-06-03 ENCOUNTER — Encounter: Payer: Self-pay | Admitting: Internal Medicine

## 2018-06-03 ENCOUNTER — Other Ambulatory Visit: Payer: Self-pay

## 2018-06-03 ENCOUNTER — Ambulatory Visit (INDEPENDENT_AMBULATORY_CARE_PROVIDER_SITE_OTHER): Payer: 59 | Admitting: Internal Medicine

## 2018-06-03 DIAGNOSIS — R350 Frequency of micturition: Secondary | ICD-10-CM

## 2018-06-03 DIAGNOSIS — Z87440 Personal history of urinary (tract) infections: Secondary | ICD-10-CM

## 2018-06-03 NOTE — Telephone Encounter (Signed)
Not sure  What she needs  Pleas contact her and ? Video visit  To see what she needs for lab

## 2018-06-03 NOTE — Progress Notes (Signed)
Virtual Visit via Video Note  I connected with@ on 06/03/18 at  3:00 PM EDT by a video enabled telemedicine application and verified that I am speaking with the correct person using two identifiers. Location patient: home Location provider: home office Persons participating in the virtual visit: patient, provider  [place] national recommendations  regarding COVID 19 pandemic   video visit is advised over in office visit for this patient.  Discussed the limitations of evaluation and management by telemedicine and  availability of in person appointments. The patient expressed understanding and agreed to proceed.   HPI: Christina [name]  Frequently  Comes and goes  For 2-3 weeks . In waves   Feels like uti but then better .  Taking water.   Cranberry pill  And probiotic .  4- 5 off and on .    Self rx with left over augmenitn )( for sinus)   2 x in pasta weeks  And seems to help  But comes back  Not sure if infection  Or other .  No fever flank pain    ot as bad as sx when she had uti dx in the fall.  Having some itching and dec vag sensation  Hx of surgery  Epidural for back   ROS: See pertinent positives and negatives per HPI. No fever flank pain   Past Medical History:  Diagnosis Date  . Arthritis   . Chicken pox     Past Surgical History:  Procedure Laterality Date  . CHOLECYSTECTOMY      Family History  Problem Relation Age of Onset  . Cancer Mother   . Hyperlipidemia Mother   . Hypertension Mother   . Stroke Mother   . Cancer Father   . Hypertension Maternal Grandmother   . Hypertension Paternal Grandmother   . Stroke Paternal Grandmother     SOCIAL HX:  Social History   Socioeconomic History  . Marital status: Married    Spouse name: Not on file  . Number of children: Not on file  . Years of education: Not on file  . Highest education level: Not on file  Occupational History  . Not on file  Social Needs  . Financial resource strain: Not on file  . Food insecurity:     Worry: Not on file    Inability: Not on file  . Transportation needs:    Medical: Not on file    Non-medical: Not on file  Tobacco Use  . Smoking status: Former Research scientist (life sciences)  . Smokeless tobacco: Never Used  Substance and Sexual Activity  . Alcohol use: Yes  . Drug use: No  . Sexual activity: Yes    Partners: Male  Lifestyle  . Physical activity:    Days per week: Not on file    Minutes per session: Not on file  . Stress: Not on file  Relationships  . Social connections:    Talks on phone: Not on file    Gets together: Not on file    Attends religious service: Not on file    Active member of club or organization: Not on file    Attends meetings of clubs or organizations: Not on file    Relationship status: Not on file  Other Topics Concern  . Not on file  Social History Narrative  . Not on file     Current Outpatient Medications:  .  azelastine (ASTELIN) 0.1 % nasal spray, Place 2 sprays into both nostrils 2 (two) times daily as  needed for rhinitis. Use in each nostril as directed, Disp: 30 mL, Rfl: 5 .  cefdinir (OMNICEF) 300 MG capsule, Take 1 capsule (300 mg total) by mouth 2 (two) times daily., Disp: 10 capsule, Rfl: 0 .  cetirizine (ZYRTEC) 10 MG tablet, Take 1 tablet (10 mg total) by mouth daily., Disp: 30 tablet, Rfl: 5 .  Cholecalciferol (VITAMIN D PO), Take 1 tablet by mouth daily., Disp: , Rfl:  .  diazepam (VALIUM) 5 MG tablet, One tab by mouth, 2 hours before procedure., Disp: 2 tablet, Rfl: 0 .  fluconazole (DIFLUCAN) 150 MG tablet, 1 po x 1  May repeat in 3 days if needed, Disp: 2 tablet, Rfl: 0 .  gabapentin (NEURONTIN) 100 MG capsule, Take 2 capsules (200 mg total) by mouth at bedtime., Disp: 60 capsule, Rfl: 3 .  losartan (COZAAR) 50 MG tablet, TAKE 1 TABLET BY MOUTH EVERY DAY, Disp: 90 tablet, Rfl: 0 .  predniSONE (DELTASONE) 10 MG tablet, Take two tablets twice daily for three days, then one tablet twice daily for three days, then STOP., Disp: 18 tablet,  Rfl: 0 .  Probiotic Product (PROBIOTIC PO), Take 1 tablet by mouth daily as needed., Disp: , Rfl:  .  solifenacin (VESICARE) 5 MG tablet, TAKE 1 TABLET(5 MG) BY MOUTH DAILY, Disp: 30 tablet, Rfl: 1  EXAM:  VITALS per patient if applicable:  GENERAL: alert, oriented, appears well and in no acute distress  HEENT: atraumatic, conjunttiva clear, no obvious abnormalities on inspection of external nose and ears  NECK: normal movements of the head and neck  LUNGS: on inspection no signs of respiratory distress, breathing rate appears normal, no obvious gross SOB, gasping or wheezing  CV: no obvious cyanosis  MS: moves all visible extremities without noticeable abnormality  PSYCH/NEURO: pleasant and cooperative, no obvious depression or anxiety, speech and thought processing grossly intact  ASSESSMENT AND PLAN:  Discussed the following assessment and plan:  Urinary frequency - Plan: POCT Urinalysis Dipstick (Automated), Urine Culture  History of UTI  Hx of uti   Poss partly rex uti  Based on waxing and waning sx and intermittent antibiotic  No alarm sx   Stop antibiotic ( had only 2 doses) tomorrow  Come to lab for poct ua and ur cx and then go from there. Neg hx of covid 19 uri cough  resp sx . To come to lab.   Expectant management and discussion of plan and treatment with patient with opportunity to ask questions and all were answered. The patient agreed with the plan and demonstrated an understanding of the instructions.   The patient was advised to call back or seek an in-person evaluation if worsening having concerns    or if the condition fails to improve as anticipated. Shanon Ace, MD

## 2018-06-04 DIAGNOSIS — Z13220 Encounter for screening for lipoid disorders: Secondary | ICD-10-CM

## 2018-06-04 DIAGNOSIS — Z1159 Encounter for screening for other viral diseases: Secondary | ICD-10-CM

## 2018-06-04 DIAGNOSIS — I10 Essential (primary) hypertension: Secondary | ICD-10-CM

## 2018-06-04 DIAGNOSIS — M5416 Radiculopathy, lumbar region: Secondary | ICD-10-CM

## 2018-06-04 DIAGNOSIS — R5383 Other fatigue: Secondary | ICD-10-CM

## 2018-06-04 DIAGNOSIS — N390 Urinary tract infection, site not specified: Secondary | ICD-10-CM

## 2018-06-04 DIAGNOSIS — R350 Frequency of micturition: Secondary | ICD-10-CM

## 2018-06-04 LAB — POC URINALSYSI DIPSTICK (AUTOMATED)
Bilirubin, UA: NEGATIVE
Glucose, UA: NEGATIVE
Ketones, UA: NEGATIVE
Leukocytes, UA: NEGATIVE
Nitrite, UA: NEGATIVE
Protein, UA: NEGATIVE
Spec Grav, UA: >=1.03 — AB (ref 1.010–1.025)
Urobilinogen, UA: 0.2 E.U./dL
pH, UA: 6 (ref 5.0–8.0)

## 2018-06-04 NOTE — Addendum Note (Signed)
Addended by: Elmer Picker on: 06/04/2018 09:37 AM   Modules accepted: Orders

## 2018-06-04 NOTE — Telephone Encounter (Signed)
jsut saw this message .  And her urine is minimally abnormal . Culture pending .  We can order future labs   If needed but dont think  Fatigue is  Related to her urine sx.        Lets see what the culture shows and  Plan fasting lab next week  Since she hasnt had this done in the past year.   I will place order by the ned of the day .

## 2018-06-04 NOTE — Telephone Encounter (Signed)
I have placed orders  For blood work   Prefer fasting   Christina [name] can you arrange this with her ( next week is ok)

## 2018-06-05 LAB — URINE CULTURE
MICRO NUMBER:: 400537
SPECIMEN QUALITY:: ADEQUATE

## 2018-06-08 NOTE — Progress Notes (Signed)
urine culture showed multiple bacteria that are external germs and not  A UTI germ  Please get the lab tests done and if  Urine sx getting worse would repeat the ua and culture

## 2018-06-17 NOTE — Telephone Encounter (Signed)
If having chest pain and  chort of breath   Episodes  Consider seeking care    In ED  ( triage her chest pain ) if more like heart burn try  cinmetidine  Or  pepcid otc in the short run.

## 2018-06-17 NOTE — Telephone Encounter (Signed)
These blood pressure readings are  High .  Take blood pressure readings twice a day for 7- 10 days  .Send in readings along with pulse .  Marland Kitchen Remember to sit and relax for 3-5 minutes before taking readings . Take 2 readings per time. ( second one is usually lower ).  Get the blood work ordered . Make Video visit in   10 - 14 days .  With information.   If  Your BP stays  160 and above   We need to intervene   With treatment .

## 2018-06-18 NOTE — Telephone Encounter (Signed)
Can do 15 second and multiply x 4 if reguglar  But the BP machine should also  Record the pulse

## 2018-06-23 ENCOUNTER — Encounter: Payer: Self-pay | Admitting: Allergy & Immunology

## 2018-06-24 ENCOUNTER — Encounter: Payer: Self-pay | Admitting: Allergy & Immunology

## 2018-06-24 ENCOUNTER — Ambulatory Visit (INDEPENDENT_AMBULATORY_CARE_PROVIDER_SITE_OTHER): Payer: 59 | Admitting: Allergy & Immunology

## 2018-06-24 ENCOUNTER — Other Ambulatory Visit: Payer: Self-pay

## 2018-06-24 DIAGNOSIS — J302 Other seasonal allergic rhinitis: Secondary | ICD-10-CM

## 2018-06-24 DIAGNOSIS — J3089 Other allergic rhinitis: Secondary | ICD-10-CM

## 2018-06-24 DIAGNOSIS — L508 Other urticaria: Secondary | ICD-10-CM | POA: Diagnosis not present

## 2018-06-24 MED ORDER — PREDNISONE 10 MG PO TABS: ORAL_TABLET | ORAL | 0 refills | Status: DC

## 2018-06-24 NOTE — Telephone Encounter (Signed)
I see that  Dr gallagher is asking for a telephone visit  ? Have him assess ? Sand if needed  Get with me   Did you get your   Blood owrk done?   Would get this done    Prednisone can effect blood sugar and blood count

## 2018-06-24 NOTE — Progress Notes (Signed)
RE: Keyna Blizard MRN: [id number] DOB: 1960-07-26 Date of Telemedicine Visit: 06/24/2018  Referring provider: Burnis Medin, MD Primary care provider: Burnis Medin, MD  Chief Complaint: Rash (started last week, will not go away )   Telemedicine Follow Up Visit via Telephone: I connected with Jasdeep Freel for a follow up on 06/24/18 by telephone and verified that I am speaking with the correct person using two identifiers.   I discussed the limitations, risks, security and privacy concerns of performing an evaluation and management service by telephone and the availability of in person appointments. I also discussed with the patient that there may be a patient responsible charge related to this service. The patient expressed understanding and agreed to proceed.  Patient is at home accompanied by herself who provided/contributed to the history.  Provider is at the office.  Visit start time: 3:52 PM Visit end time: 4:07 PM Insurance consent/check in by: [place] consent and medical assistant/nurse: Lovena Le  History of Present Illness:  She is a 58 y.o. female, who is being followed for chronic urticaria and perennial and seasonal allergic rhinitis. Her previous allergy office visit was in October 2019 with Dr. Ernst Bowler.  At that time, we treated her for sinusitis with amoxicillin twice a day for 7 days and Depo-Medrol.  We also gave her a steroid pack to start.  She seemed to be on board with the plan to start allergen immunotherapy as avoidance measures had not worked.  We continued her to using daily and Nasacort.  We also added on Astelin nasal spray.  In the interim, she actually did not need to use the amoxicillin.  She got better with the steroids alone.  However, over the past few days, she has developed a rash over her abdomen.  She spent this past weekend [place] and within 12 to 24 hours she developed this rash.  She does describe it as raised.  She did give [place] a picture which  unfortunately is not all that clear.  She denies any vesicle formation or clear discharge.  She has been using her husband's clobetasol to see if this can improve the rash, and while he keeps it at [place] it has not resolved it completely.  She is otherwise having no systemic symptoms.  She has been using her cetirizine as recommended, up to twice a day, without much improvement with regards to the rash.  She denies any exposure to poison ivy, but she does tell me that she was not sure where she was pulling up at all.  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  She remains unconvinced about allergy shots, but does note that she does not want to continue to get prednisone to help deal with her symptoms.  She is going to give it another year to see how she does before making that decision.   Otherwise, there have been no changes to her past medical history, surgical history, family history, or social history.  Assessment and Plan:  Mariselda is a 58 y.o. female with:  Seasonal and perennial allergic rhinitis (weeds, grasses, indoor molds, dust mites, cat, dog and cockroach)  Chronic urticaria  Contact dermatitis - ? poison ivy   1. Seasonal and perennial allergic rhinitis -Continue with cetirizine 1 to 2 tablets daily. -Continue with Nasacort 1 to 2 sprays per nostril daily. -Consider allergen immunotherapy for long-term control.  2. Chronic urticaria -Continue with the antihistamines as above.  3. Rash - ? contact dermatitis -Start the prednisone  taper. -Continue with clobetasol twice daily. -Call us with an update in a few days.  4. Return in about 6 months (around 12/25/2018). This can be an in-person, a virtual Webex or a telephone follow up visit.     Diagnostics: None.  Medication List:  Current Outpatient Medications  Medication Sig Dispense Refill  . cetirizine (ZYRTEC) 10 MG tablet Take 1 tablet (10 mg total) by mouth daily. 30 tablet 5  . Cholecalciferol (VITAMIN D PO) Take 1 tablet by  mouth daily.    Marland Kitchen losartan (COZAAR) 50 MG tablet TAKE 1 TABLET BY MOUTH EVERY DAY 90 tablet 0  . Probiotic Product (PROBIOTIC PO) Take 1 tablet by mouth daily as needed.    Marland Kitchen azelastine (ASTELIN) 0.1 % nasal spray Place 2 sprays into both nostrils 2 (two) times daily as needed for rhinitis. Use in each nostril as directed 30 mL 5  . predniSONE (DELTASONE) 10 MG tablet Take 3 tabs (30mg ) twice daily for 3 days, then 2 tabs (20mg ) twice daily for 3 days, then 1 tab (10mg ) twice daily for 3 days, then STOP. 36 tablet 0  . solifenacin (VESICARE) 5 MG tablet TAKE 1 TABLET(5 MG) BY MOUTH DAILY (Patient not taking: Reported on 06/24/2018) 30 tablet 1   No current facility-administered medications for this visit.    Allergies: No Known Allergies I reviewed her past medical history, social history, family history, and environmental history and no significant changes have been reported from previous visits.  Review of Systems  Constitutional: Negative for activity change and appetite change.  HENT: Negative for congestion, postnasal drip, rhinorrhea, sinus pressure and sore throat.   Eyes: Negative for pain, discharge, redness and itching.  Respiratory: Negative for shortness of breath, wheezing and stridor.   Gastrointestinal: Negative for diarrhea, nausea and vomiting.  Musculoskeletal: Negative for arthralgias, joint swelling and myalgias.  Skin: Positive for rash.  Allergic/Immunologic: Negative for environmental allergies and food allergies.    Objective:  Physical exam not obtained as encounter was done via telephone.   Previous notes and tests were reviewed.  I discussed the assessment and treatment plan with the patient. The patient was provided an opportunity to ask questions and all were answered. The patient agreed with the plan and demonstrated an understanding of the instructions.   The patient was advised to call back or seek an in-person evaluation if the symptoms worsen or if the  condition fails to improve as anticipated.  I provided 15 minutes of non-face-to-face time during this encounter.  It was my pleasure to participate in [facility] care today. Please feel free to contact me with any questions or concerns.   Sincerely,  Valentina Shaggy, MD

## 2018-06-24 NOTE — Telephone Encounter (Signed)
Patient has called back today stating she hasnt gotten a response from yesterday Patient was given a telephone visit for this Friday However patient states itching a rash is worsening - if its over the phone are there any time sooner to speak with the physician.  Please call patient with any questions or response OR please advise staff on schedule

## 2018-06-24 NOTE — Patient Instructions (Addendum)
1. Seasonal and perennial allergic rhinitis -Continue with cetirizine 1 to 2 tablets daily. -Continue with Nasacort 1 to 2 sprays per nostril daily. -Consider allergen immunotherapy for long-term control.  2. Chronic urticaria -Continue with the antihistamines as above.  3. Rash - ? contact dermatitis -Start the prednisone taper. -Continue with clobetasol twice daily. -Call us with an update in a few days.  4. Return in about 6 months (around 12/25/2018). This can be an in-person, a virtual Webex or a telephone follow up visit.   Please inform us of any Emergency Department visits, hospitalizations, or changes in symptoms. Call us before going to the ED for breathing or allergy symptoms since we might be able to fit you in for a sick visit. Feel free to contact us anytime with any questions, problems, or concerns.  It was a pleasure to talk to you today today!  Websites that have reliable patient information: 1. American Academy of Asthma, Allergy, and Immunology: www.aaaai.org 2. Food Allergy Research and Education (FARE): foodallergy.org 3. Mothers of Asthmatics: http://www.asthmacommunitynetwork.org 4. American College of Allergy, Asthma, and Immunology: www.acaai.org  "Like" [place] on Facebook and Instagram for our latest updates!      Make sure you are registered to vote! If you have moved or changed any of your contact information, you will need to get this updated before voting!    Voter ID laws are NOT going into effect for the General Election in November 2020! DO NOT let this stop you from exercising your right to vote!

## 2018-06-26 ENCOUNTER — Ambulatory Visit: Payer: 59 | Admitting: Allergy & Immunology

## 2018-07-09 ENCOUNTER — Ambulatory Visit: Payer: 59 | Admitting: Family Medicine

## 2018-07-14 NOTE — Telephone Encounter (Signed)
Done 4 22 2014  And normal   No  Advice  About next  Colon cue  Scanned in  Please make sure results area documented in  Record  And tell patient it was normal    Usually a 10 year recall if no strong family hx of colon cancer .

## 2018-07-14 NOTE — Telephone Encounter (Signed)
So  The report says no polyps   On the  Scan I see .  I think you should see a Gi doctor and discuss with them  May we refer you to  GI   For decision about next colon or  Other screening ?

## 2018-08-15 ENCOUNTER — Other Ambulatory Visit: Payer: Self-pay | Admitting: Internal Medicine

## 2018-08-15 DIAGNOSIS — I10 Essential (primary) hypertension: Secondary | ICD-10-CM

## 2018-08-28 ENCOUNTER — Other Ambulatory Visit: Payer: Self-pay | Admitting: Internal Medicine

## 2018-08-28 ENCOUNTER — Encounter: Payer: Self-pay | Admitting: Family Medicine

## 2018-08-28 ENCOUNTER — Ambulatory Visit: Payer: 59 | Admitting: Family Medicine

## 2018-08-28 ENCOUNTER — Other Ambulatory Visit: Payer: Self-pay

## 2018-08-28 DIAGNOSIS — M5030 Other cervical disc degeneration, unspecified cervical region: Secondary | ICD-10-CM | POA: Diagnosis not present

## 2018-08-28 DIAGNOSIS — Z1231 Encounter for screening mammogram for malignant neoplasm of breast: Secondary | ICD-10-CM

## 2018-08-28 DIAGNOSIS — S161XXA Strain of muscle, fascia and tendon at neck level, initial encounter: Secondary | ICD-10-CM

## 2018-08-28 MED ORDER — MELOXICAM 15 MG PO TABS: 15.0000 mg | ORAL_TABLET | Freq: Every day | ORAL | 0 refills | Status: DC

## 2018-08-28 NOTE — Assessment & Plan Note (Signed)
Patient is more of an external of the occiput area on the right side.  I do not see any trouble with radicular symptoms at this time.  Patient has known degenerative disc disease at C6-C7.  No radicular symptoms no weakness.  Patient is just point tenderness.  No lymph node appreciated.  Patient is to increase activity slowly over the course of time.  Unable to do what she can tolerate.  Meloxicam given and will do a 10-day burst.  If not better in 3 weeks patient will come back at that time for further evaluation and treatment

## 2018-08-28 NOTE — Progress Notes (Signed)
[facility] [address], [place] [address] Phone: [phone] Subjective:   [name], am serving as a scribe for Dr. Hulan Saas.  I'm seeing this patient by the request  of:    CC: Neck pain  YSA:[id number]  Christina [name] is a 58 y.o. female coming in with complaint of neck pain. Patient states that she has been having pain over the right side of cervical spine. Pain near the base of the skull. Denies any radiating pain.   Had epidural on 03/24/2018. Has not had pain with activity since epidural.     Past Medical History:  Diagnosis Date  . Arthritis   . Chicken pox    Past Surgical History:  Procedure Laterality Date  . CHOLECYSTECTOMY     Social History   Socioeconomic History  . Marital status: Married    Spouse name: Not on file  . Number of children: Not on file  . Years of education: Not on file  . Highest education level: Not on file  Occupational History  . Not on file  Social Needs  . Financial resource strain: Not on file  . Food insecurity    Worry: Not on file    Inability: Not on file  . Transportation needs    Medical: Not on file    Non-medical: Not on file  Tobacco Use  . Smoking status: Former Research scientist (life sciences)  . Smokeless tobacco: Never Used  Substance and Sexual Activity  . Alcohol use: Yes  . Drug use: No  . Sexual activity: Yes    Partners: Male  Lifestyle  . Physical activity    Days per week: Not on file    Minutes per session: Not on file  . Stress: Not on file  Relationships  . Social Herbalist on phone: Not on file    Gets together: Not on file    Attends religious service: Not on file    Active member of club or organization: Not on file    Attends meetings of clubs or organizations: Not on file    Relationship status: Not on file  Other Topics Concern  . Not on file  Social History Narrative  . Not on file   No Known Allergies Family History  Problem Relation Age of Onset  .  Cancer Mother   . Hyperlipidemia Mother   . Hypertension Mother   . Stroke Mother   . Cancer Father   . Hypertension Maternal Grandmother   . Hypertension Paternal Grandmother   . Stroke Paternal Grandmother   . Allergic rhinitis Neg Hx   . Asthma Neg Hx     Current Outpatient Medications (Endocrine & Metabolic):  .  predniSONE (DELTASONE) 10 MG tablet, Take 3 tabs (30mg ) twice daily for 3 days, then 2 tabs (20mg ) twice daily for 3 days, then 1 tab (10mg ) twice daily for 3 days, then STOP.  Current Outpatient Medications (Cardiovascular):  .  losartan (COZAAR) 50 MG tablet, TAKE 1 TABLET BY MOUTH EVERY DAY  Current Outpatient Medications (Respiratory):  .  cetirizine (ZYRTEC) 10 MG tablet, Take 1 tablet (10 mg total) by mouth daily. Marland Kitchen  azelastine (ASTELIN) 0.1 % nasal spray, Place 2 sprays into both nostrils 2 (two) times daily as needed for rhinitis. Use in each nostril as directed  Current Outpatient Medications (Analgesics):  .  meloxicam (MOBIC) 15 MG tablet, Take 1 tablet (15 mg total) by mouth daily.   Current  Outpatient Medications (Other):  Marland Kitchen  Cholecalciferol (VITAMIN D PO), Take 1 tablet by mouth daily. .  Probiotic Product (PROBIOTIC PO), Take 1 tablet by mouth daily as needed. .  solifenacin (VESICARE) 5 MG tablet, TAKE 1 TABLET(5 MG) BY MOUTH DAILY    Past medical history, social, surgical and family history all reviewed in electronic medical record.  No pertanent information unless stated regarding to the chief complaint.   Review of Systems:  No headache, visual changes, nausea, vomiting, diarrhea, constipation, dizziness, abdominal pain, skin rash, fevers, chills, night sweats, weight loss, swollen lymph nodes, body aches, joint swelling, muscle aches, chest pain, shortness of breath, mood changes.   Objective  Blood pressure 110/78, pulse 80, height 5\' 2"  (1.575 m), weight 147 lb (66.7 kg), SpO2 97 %.    General: No apparent distress alert and oriented x3  mood and affect normal, dressed appropriately.  HEENT: Pupils equal, extraocular movements intact  Respiratory: Patient's speak in full sentences and does not appear short of breath  Cardiovascular: No lower extremity edema, non tender, no erythema  Skin: Warm dry intact with no signs of infection or rash on extremities or on axial skeleton.  Abdomen: Soft nontender  Neuro: Cranial nerves II through XII are intact, neurovascularly intact in all extremities with 2+ DTRs and 2+ pulses.  Lymph: No lymphadenopathy of posterior or anterior cervical chain or axillae bilaterally.  Gait normal with good balance and coordination.  MSK:  Non tender with full range of motion and good stability and symmetric strength and tone of shoulders, elbows, wrist, hip, knee and ankles bilaterally.  Neck: Inspection loss of lordosis. No palpable stepoffs.  Patient is tender to palpation at the occipital level on the right side.  No lymph nodes appreciated.  Patient is highly tender though. Negative Spurling's maneuver. Limited range of motion lacking the last 5 degrees of extension otherwise nothing. Grip strength and sensation normal in bilateral hands Strength good C4 to T1 distribution No sensory change to C4 to T1 Negative Hoffman sign bilaterally Reflexes normal   Impression and Recommendations:      The above documentation has been reviewed and is accurate and complete Lyndal Pulley, DO       Note: This dictation was prepared with Dragon dictation along with smaller phrase technology. Any transcriptional errors that result from this process are unintentional.

## 2018-08-28 NOTE — Patient Instructions (Addendum)
Good to see you  Meloxicam for 10 days then as needed Ice after activity See me in 3 weeks if not perfect

## 2018-08-31 ENCOUNTER — Telehealth: Payer: Self-pay | Admitting: Internal Medicine

## 2018-08-31 ENCOUNTER — Telehealth: Payer: Self-pay

## 2018-08-31 DIAGNOSIS — S8990XA Unspecified injury of unspecified lower leg, initial encounter: Secondary | ICD-10-CM
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
## 2018-08-31 NOTE — Telephone Encounter (Signed)
Spoke with patient who injured her knee over the weekend. First opening on the 28th of the month. Patient asks for recommendation on who else to see. Per a verbal from Dr. Tamala Julian patient recommended to call Dr. Clearance Coots. Patient voices understanding.

## 2018-09-01 ENCOUNTER — Ambulatory Visit: Payer: Self-pay

## 2018-09-01 ENCOUNTER — Encounter: Payer: Self-pay | Admitting: Family Medicine

## 2018-09-01 ENCOUNTER — Other Ambulatory Visit: Payer: Self-pay

## 2018-09-01 ENCOUNTER — Ambulatory Visit: Payer: 59 | Admitting: Family Medicine

## 2018-09-01 VITALS — BP 124/80 | HR 66 | Ht 62.0 in | Wt 148.0 lb

## 2018-09-01 DIAGNOSIS — M25562 Pain in left knee: Secondary | ICD-10-CM

## 2018-09-01 DIAGNOSIS — S83412A Sprain of medial collateral ligament of left knee, initial encounter: Secondary | ICD-10-CM

## 2018-09-01 MED ORDER — PENNSAID 2 % TD SOLN: 1.0000 "application " | Freq: Two times a day (BID) | TRANSDERMAL | 3 refills | Status: DC

## 2018-09-01 MED ORDER — MELOXICAM 15 MG PO TABS: 15.0000 mg | ORAL_TABLET | Freq: Every day | ORAL | 0 refills | Status: DC

## 2018-09-01 NOTE — Assessment & Plan Note (Signed)
It appears that she has an MCL sprain.  No significant effusion.  No significant instability. -Provided Ace wrap. -Refilled meloxicam. -Provided Pennsaid. -Counseled on home exercise therapy and supportive care -If no improvement can consider imaging or physical therapy.

## 2018-09-01 NOTE — Patient Instructions (Signed)
Nice to meet you  Please try the ace wrap  Please try the exercises  Please try ice.  You can use the rub on medicine instead of the meloxicam.  Please send me a message in MyChart with any questions or updates.  Please see me or Dr. Tamala Julian back in 4 weeks.   --Dr. Raeford Razor

## 2018-09-01 NOTE — Telephone Encounter (Signed)
Christina [name] is working on getting this pt scheduled. They will call her directly.
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
## 2018-09-06 ENCOUNTER — Other Ambulatory Visit: Payer: Self-pay | Admitting: Internal Medicine

## 2018-09-28 ENCOUNTER — Other Ambulatory Visit: Payer: Self-pay

## 2018-09-28 DIAGNOSIS — S83412A Sprain of medial collateral ligament of left knee, initial encounter: Secondary | ICD-10-CM

## 2018-09-28 MED ORDER — MELOXICAM 15 MG PO TABS: 15.0000 mg | ORAL_TABLET | Freq: Every day | ORAL | 0 refills | Status: DC

## 2018-09-28 NOTE — Progress Notes (Unsigned)
Meloxicam refilled from paper request.

## 2018-10-06 ENCOUNTER — Other Ambulatory Visit: Payer: Self-pay | Admitting: Allergy & Immunology

## 2018-10-06 ENCOUNTER — Other Ambulatory Visit: Payer: Self-pay

## 2018-10-06 ENCOUNTER — Ambulatory Visit: Payer: 59 | Admitting: Family Medicine

## 2018-10-06 ENCOUNTER — Ambulatory Visit: Payer: Self-pay

## 2018-10-06 ENCOUNTER — Encounter: Payer: Self-pay | Admitting: Family Medicine

## 2018-10-06 VITALS — BP 130/84 | HR 76 | Ht 62.0 in | Wt 146.0 lb

## 2018-10-06 DIAGNOSIS — S83412A Sprain of medial collateral ligament of left knee, initial encounter: Secondary | ICD-10-CM

## 2018-10-06 DIAGNOSIS — S83412D Sprain of medial collateral ligament of left knee, subsequent encounter: Secondary | ICD-10-CM | POA: Diagnosis not present

## 2018-10-06 NOTE — Patient Instructions (Signed)
Good to see you Please try to build up the quads and hip abductors  Pleas wear the hinged knee brace. Wear this until you can balance on the left side with no significant pain  Please send me a message in MyChart with any questions or updates.  Please see [place] back in 2-3 months if your pain continues.   --Dr. Raeford Razor

## 2018-10-06 NOTE — Progress Notes (Signed)
Christina [name] - 58 y.o. female MRN [id number]  Date of birth: 1960/08/07  SUBJECTIVE:  Including CC & ROS.  Chief Complaint  Patient presents with  . Follow-up    follow up for left knee    Christina [name] is a 58 y.o. female that is following up for her left knee pain.  She was diagnosed with an MCL sprain.  She had improvement of her symptoms for a while but has recently gotten worse.  The pain seems to be worse at night with throbbing and aching.  She seems to be okay during the day for the most part.  The pain is intermittent in nature.  The pain can be severe at times.  Is localized to the medial joint line.  Denies any mechanical symptoms.  Denies any significant swelling.   Review of Systems  Constitutional: Negative for fever.  HENT: Negative for congestion.   Respiratory: Negative for cough.   Cardiovascular: Negative for chest pain.  Gastrointestinal: Negative for abdominal pain.  Musculoskeletal: Positive for joint swelling.  Skin: Negative for color change.  Neurological: Negative for weakness.  Hematological: Negative for adenopathy.    HISTORY: Past Medical, Surgical, Social, and Family History Reviewed & Updated per EMR.   Pertinent Historical Findings include:  Past Medical History:  Diagnosis Date  . Arthritis   . Chicken pox     Past Surgical History:  Procedure Laterality Date  . CHOLECYSTECTOMY      No Known Allergies  Family History  Problem Relation Age of Onset  . Cancer Mother   . Hyperlipidemia Mother   . Hypertension Mother   . Stroke Mother   . Cancer Father   . Hypertension Maternal Grandmother   . Hypertension Paternal Grandmother   . Stroke Paternal Grandmother   . Allergic rhinitis Neg Hx   . Asthma Neg Hx      Social History   Socioeconomic History  . Marital status: Married    Spouse name: Not on file  . Number of children: Not on file  . Years of education: Not on file  . Highest education level: Not on file  Occupational History  .  Not on file  Social Needs  . Financial resource strain: Not on file  . Food insecurity    Worry: Not on file    Inability: Not on file  . Transportation needs    Medical: Not on file    Non-medical: Not on file  Tobacco Use  . Smoking status: Former Research scientist (life sciences)  . Smokeless tobacco: Never Used  Substance and Sexual Activity  . Alcohol use: Yes  . Drug use: No  . Sexual activity: Yes    Partners: Male  Lifestyle  . Physical activity    Days per week: Not on file    Minutes per session: Not on file  . Stress: Not on file  Relationships  . Social Herbalist on phone: Not on file    Gets together: Not on file    Attends religious service: Not on file    Active member of club or organization: Not on file    Attends meetings of clubs or organizations: Not on file    Relationship status: Not on file  . Intimate partner violence    Fear of current or ex partner: Not on file    Emotionally abused: Not on file    Physically abused: Not on file    Forced sexual activity: Not on file  Other  Topics Concern  . Not on file  Social History Narrative  . Not on file     PHYSICAL EXAM:  VS: BP 130/84   Pulse 76   Ht 5\' 2"  (1.575 m)   Wt 146 lb (66.2 kg)   BMI 26.70 kg/m  Physical Exam Gen: NAD, alert, cooperative with exam, well-appearing ENT: normal lips, normal nasal mucosa,  Eye: normal EOM, normal conjunctiva and lids CV:  no edema, +2 pedal pulses   Resp: no accessory muscle use, non-labored,   Skin: no rashes, no areas of induration  Neuro: normal tone, normal sensation to touch Psych:  normal insight, alert and oriented MSK:  Left knee: No obvious effusion. Mild tenderness palpation of the medial joint line. Normal range of motion. Normal strength resistance. Some pain with valgus stressing Some pain with one leg squatting on the left. Neurovascular intact  Limited ultrasound: Left knee:  Mild effusion in the suprapatellar pouch. No significant medial  joint space narrowing but does have changes of the medial meniscus.  There is a mild effusion and possible tear at this area of the anterior horn. MCL looks improved compared to previously with decreased swelling and vascular uptake in the area.  Summary: Findings suggestive of meniscal irritation  Ultrasound and interpretation by Clearance Coots, MD      ASSESSMENT & PLAN:   Sprain of medial collateral ligament of left knee Seems to be healing.  Does have some ongoing pain which seems to be more meniscus at this point.  Had effusion on prior ultrasound as well.  May have some instability that is leading to the irritation of the meniscus. -Hinged knee brace. -Counseled on home exercise therapy and supportive care. -If no improvement will consider injection and xray.

## 2018-10-06 NOTE — Assessment & Plan Note (Addendum)
Seems to be healing.  Does have some ongoing pain which seems to be more meniscus at this point.  Had effusion on prior ultrasound as well.  May have some instability that is leading to the irritation of the meniscus. -Hinged knee brace. -Counseled on home exercise therapy and supportive care. -If no improvement will consider injection and xray.

## 2018-10-12 ENCOUNTER — Other Ambulatory Visit: Payer: Self-pay

## 2018-10-12 ENCOUNTER — Ambulatory Visit
Admission: RE | Admit: 2018-10-12 | Discharge: 2018-10-12 | Disposition: A | Discharge status: Home / Self Care | Payer: 59 | Source: Ambulatory Visit | Attending: Internal Medicine | Admitting: Internal Medicine

## 2018-10-12 DIAGNOSIS — Z1231 Encounter for screening mammogram for malignant neoplasm of breast: Secondary | ICD-10-CM

## 2018-11-02 ENCOUNTER — Ambulatory Visit: Payer: 59

## 2018-11-04 ENCOUNTER — Other Ambulatory Visit: Payer: Self-pay

## 2018-11-04 ENCOUNTER — Other Ambulatory Visit: Payer: Self-pay | Admitting: Internal Medicine

## 2018-11-04 ENCOUNTER — Ambulatory Visit (INDEPENDENT_AMBULATORY_CARE_PROVIDER_SITE_OTHER): Payer: 59 | Admitting: *Deleted

## 2018-11-04 DIAGNOSIS — Z23 Encounter for immunization: Secondary | ICD-10-CM | POA: Diagnosis not present

## 2018-11-04 NOTE — Progress Notes (Signed)
Patient in clinic for Shingles vaccine. Vaccine administered with no adverse reactions.

## 2018-11-10 ENCOUNTER — Other Ambulatory Visit: Payer: Self-pay

## 2018-11-10 ENCOUNTER — Encounter: Payer: Self-pay | Admitting: Internal Medicine

## 2018-11-10 ENCOUNTER — Ambulatory Visit: Payer: 59 | Admitting: Family Medicine

## 2018-11-10 ENCOUNTER — Telehealth (INDEPENDENT_AMBULATORY_CARE_PROVIDER_SITE_OTHER): Payer: 59 | Admitting: Internal Medicine

## 2018-11-10 DIAGNOSIS — R399 Unspecified symptoms and signs involving the genitourinary system: Secondary | ICD-10-CM | POA: Diagnosis not present

## 2018-11-10 DIAGNOSIS — R3989 Other symptoms and signs involving the genitourinary system: Secondary | ICD-10-CM | POA: Diagnosis not present

## 2018-11-10 DIAGNOSIS — Z87440 Personal history of urinary (tract) infections: Secondary | ICD-10-CM | POA: Diagnosis not present

## 2018-11-10 MED ORDER — NITROFURANTOIN MONOHYD MACRO 100 MG PO CAPS: 100.0000 mg | ORAL_CAPSULE | Freq: Two times a day (BID) | ORAL | 0 refills | Status: AC

## 2018-11-10 NOTE — Progress Notes (Signed)
Virtual Visit via Video Note  I connected with@ on 11/10/18 at  3:00 PM EDT by a video enabled telemedicine application and verified that I am speaking with the correct person using two identifiers. Location patient: home Location provider:work  office Persons participating in the virtual visit: patient, provider  [place] national recommendations  regarding COVID 19 pandemic   video visit is advised over in office visit for this patient.  Patient aware  of the limitations of evaluation and management by telemedicine and  availability of in person appointments. and agreed to proceed.   HPI: Christina [name] presents for video visit    For poss uti.  No Utis since sling surgery  January  But yesterday tired and today  Hard to urinate with pain  Unless on azo  Had inc urinary frequency . No blood or fever. Just got covid tested husband a Freight forwarder at [employer]  Had some exposure to employee positive but  Not intense exposure.  Testing results should be back in next 1-2 days.     ROS: See pertinent positives and negatives per HPI.  Past Medical History:  Diagnosis Date  . Arthritis   . Chicken pox     Past Surgical History:  Procedure Laterality Date  . CHOLECYSTECTOMY      Family History  Problem Relation Age of Onset  . Cancer Mother   . Hyperlipidemia Mother   . Hypertension Mother   . Stroke Mother   . Cancer Father   . Hypertension Maternal Grandmother   . Hypertension Paternal Grandmother   . Stroke Paternal Grandmother   . Allergic rhinitis Neg Hx   . Asthma Neg Hx     Social History   Tobacco Use  . Smoking status: Former Research scientist (life sciences)  . Smokeless tobacco: Never Used  Substance Use Topics  . Alcohol use: Yes  . Drug use: No      Current Outpatient Medications:  .  azelastine (ASTELIN) 0.1 % nasal spray, Place 2 sprays into both nostrils 2 (two) times daily as needed for rhinitis. Use in each nostril as directed, Disp: 30 mL, Rfl: 5 .  cetirizine (ZYRTEC) 10 MG tablet, TAKE  1 TABLET(10 MG) BY MOUTH DAILY, Disp: 30 tablet, Rfl: 5 .  Cholecalciferol (VITAMIN D PO), Take 1 tablet by mouth daily., Disp: , Rfl:  .  Diclofenac Sodium (PENNSAID) 2 % SOLN, Place 1 application onto the skin 2 (two) times daily., Disp: 112 g, Rfl: 3 .  losartan (COZAAR) 50 MG tablet, TAKE 1 TABLET BY MOUTH EVERY DAY, Disp: 90 tablet, Rfl: 0 .  meloxicam (MOBIC) 15 MG tablet, Take 1 tablet (15 mg total) by mouth daily., Disp: 30 tablet, Rfl: 0 .  nitrofurantoin, macrocrystal-monohydrate, (MACROBID) 100 MG capsule, Take 1 capsule (100 mg total) by mouth 2 (two) times daily for 7 days. For UTI, Disp: 14 capsule, Rfl: 0 .  predniSONE (DELTASONE) 10 MG tablet, Take 3 tabs (30mg ) twice daily for 3 days, then 2 tabs (20mg ) twice daily for 3 days, then 1 tab (10mg ) twice daily for 3 days, then STOP., Disp: 36 tablet, Rfl: 0 .  Probiotic Product (PROBIOTIC PO), Take 1 tablet by mouth daily as needed., Disp: , Rfl:  .  solifenacin (VESICARE) 5 MG tablet, TAKE 1 TABLET(5 MG) BY MOUTH DAILY, Disp: 30 tablet, Rfl: 1  EXAM: BP Readings from Last 3 Encounters:  10/06/18 130/84  09/01/18 124/80  08/28/18 110/78    VITALS per patient if applicable:  GENERAL: alert, oriented, appears  well and in no acute distress  HEENT: atraumatic, conjunttiva clear, no obvious abnormalities on inspection of external nose and ears  NECK: normal movements of the head and neck  LUNGS: on inspection no signs of respiratory distress, breathing rate appears normal, no obvious gross SOB, gasping or wheezing  CV: no obvious cyanosis  PSYCH/NEURO: pleasant and cooperative, no obvious depression or anxiety, speech and thought processing grossly intact Lab Results  Component Value Date   WBC 10.2 02/18/2015   HGB 13.5 02/18/2015   HCT 41 02/18/2015   PLT 354 02/18/2015   ALT 36 (A) 02/18/2015   AST 26 02/18/2015   NA 142 02/18/2015   K 4.3 02/18/2015   CREATININE 0.7 02/18/2015   BUN 13 02/18/2015    ASSESSMENT  AND PLAN:  Discussed the following assessment and plan:    ICD-10-CM   1. Suspected UTI  R39.89   2. History of recurrent UTIs  Z87.440   3. Lower urinary tract symptoms (LUTS)  R39.9    Benefit more than risk of empiric rx since cant come to office with covid test pending and she feels typical uti sx at this time  If not better after empiric rx then plan  To get UA with micro and ucx .  Without the Azo She is doing well otherwise Counseled.   Expectant management and discussion of plan and treatment with opportunity to ask questions and all were answered. The patient agreed with the plan and demonstrated an understanding of the instructions.   Advised to call back or seek an in-person evaluation if worsening  or having  further concerns . Return if symptoms worsen or fail to improve.  Shanon Ace, MD

## 2018-11-17 ENCOUNTER — Other Ambulatory Visit: Payer: Self-pay

## 2018-11-17 ENCOUNTER — Ambulatory Visit (INDEPENDENT_AMBULATORY_CARE_PROVIDER_SITE_OTHER): Payer: 59

## 2018-11-17 DIAGNOSIS — Z23 Encounter for immunization: Secondary | ICD-10-CM

## 2018-11-19 ENCOUNTER — Ambulatory Visit: Payer: Self-pay

## 2018-11-19 ENCOUNTER — Ambulatory Visit (INDEPENDENT_AMBULATORY_CARE_PROVIDER_SITE_OTHER)
Admission: RE | Admit: 2018-11-19 | Discharge: 2018-11-19 | Disposition: A | Discharge status: Home / Self Care | Payer: 59 | Source: Ambulatory Visit | Attending: Family Medicine | Admitting: Family Medicine

## 2018-11-19 ENCOUNTER — Other Ambulatory Visit: Payer: Self-pay

## 2018-11-19 ENCOUNTER — Encounter: Payer: Self-pay | Admitting: Family Medicine

## 2018-11-19 ENCOUNTER — Ambulatory Visit: Payer: 59 | Admitting: Family Medicine

## 2018-11-19 VITALS — BP 116/80 | HR 80 | Ht 62.0 in | Wt 140.0 lb

## 2018-11-19 DIAGNOSIS — M533 Sacrococcygeal disorders, not elsewhere classified: Secondary | ICD-10-CM

## 2018-11-19 DIAGNOSIS — S83242A Other tear of medial meniscus, current injury, left knee, initial encounter: Secondary | ICD-10-CM

## 2018-11-19 DIAGNOSIS — G8929 Other chronic pain: Secondary | ICD-10-CM

## 2018-11-19 DIAGNOSIS — M999 Biomechanical lesion, unspecified: Secondary | ICD-10-CM | POA: Diagnosis not present

## 2018-11-19 DIAGNOSIS — M25562 Pain in left knee: Secondary | ICD-10-CM

## 2018-11-19 NOTE — Progress Notes (Signed)
[facility] [address], [place] [address] Phone: [phone] Subjective:   I Christina [name] am serving as a Education administrator for Dr. Hulan Saas.  I'm seeing this patient by the request  of:    CC: Neck and left knee pain  [id number]  Christina [name] is a 58 y.o. female coming in with complaint of neck and knee pain. Injury from 4th of July. Received a knee brace. Feels as if her knee is not 100%. Was told she might have a menicus tear.   Onset- Chronic  Location - medial  Duration-  Character- sharp  Aggravating factors- breast stroke, extension (minimal pain)  Reliving factors-  Therapies tried- brace, meloxicam, ice  Severity-7 out of 10     Past Medical History:  Diagnosis Date  . Arthritis   . Chicken pox    Past Surgical History:  Procedure Laterality Date  . CHOLECYSTECTOMY     Social History   Socioeconomic History  . Marital status: Married    Spouse name: Not on file  . Number of children: Not on file  . Years of education: Not on file  . Highest education level: Not on file  Occupational History  . Not on file  Social Needs  . Financial resource strain: Not on file  . Food insecurity    Worry: Not on file    Inability: Not on file  . Transportation needs    Medical: Not on file    Non-medical: Not on file  Tobacco Use  . Smoking status: Former Research scientist (life sciences)  . Smokeless tobacco: Never Used  Substance and Sexual Activity  . Alcohol use: Yes  . Drug use: No  . Sexual activity: Yes    Partners: Male  Lifestyle  . Physical activity    Days per week: Not on file    Minutes per session: Not on file  . Stress: Not on file  Relationships  . Social Herbalist on phone: Not on file    Gets together: Not on file    Attends religious service: Not on file    Active member of club or organization: Not on file    Attends meetings of clubs or organizations: Not on file    Relationship status: Not on file  Other Topics  Concern  . Not on file  Social History Narrative  . Not on file   No Known Allergies Family History  Problem Relation Age of Onset  . Cancer Mother   . Hyperlipidemia Mother   . Hypertension Mother   . Stroke Mother   . Cancer Father   . Hypertension Maternal Grandmother   . Hypertension Paternal Grandmother   . Stroke Paternal Grandmother   . Allergic rhinitis Neg Hx   . Asthma Neg Hx     Current Outpatient Medications (Endocrine & Metabolic):  .  predniSONE (DELTASONE) 10 MG tablet, Take 3 tabs (30mg ) twice daily for 3 days, then 2 tabs (20mg ) twice daily for 3 days, then 1 tab (10mg ) twice daily for 3 days, then STOP.  Current Outpatient Medications (Cardiovascular):  .  losartan (COZAAR) 50 MG tablet, TAKE 1 TABLET BY MOUTH EVERY DAY  Current Outpatient Medications (Respiratory):  .  cetirizine (ZYRTEC) 10 MG tablet, TAKE 1 TABLET(10 MG) BY MOUTH DAILY .  azelastine (ASTELIN) 0.1 % nasal spray, Place 2 sprays into both nostrils 2 (two) times daily as needed for rhinitis. Use in each nostril as directed  Current  Outpatient Medications (Analgesics):  .  meloxicam (MOBIC) 15 MG tablet, Take 1 tablet (15 mg total) by mouth daily.   Current Outpatient Medications (Other):  Marland Kitchen  Cholecalciferol (VITAMIN D PO), Take 1 tablet by mouth daily. .  Diclofenac Sodium (PENNSAID) 2 % SOLN, Place 1 application onto the skin 2 (two) times daily. .  Probiotic Product (PROBIOTIC PO), Take 1 tablet by mouth daily as needed. .  solifenacin (VESICARE) 5 MG tablet, TAKE 1 TABLET(5 MG) BY MOUTH DAILY    Past medical history, social, surgical and family history all reviewed in electronic medical record.  No pertanent information unless stated regarding to the chief complaint.   Review of Systems:  No headache, visual changes, nausea, vomiting, diarrhea, constipation, dizziness, abdominal pain, skin rash, fevers, chills, night sweats, weight loss, swollen lymph nodes, body aches, joint swelling,  muscle aches, chest pain, shortness of breath, mood changes.   Objective  Blood pressure 116/80, pulse 80, height 5\' 2"  (1.575 m), weight 140 lb (63.5 kg), SpO2 97 %.    General: No apparent distress alert and oriented x3 mood and affect normal, dressed appropriately.  HEENT: Pupils equal, extraocular movements intact  Respiratory: Patient's speak in full sentences and does not appear short of breath  Cardiovascular: No lower extremity edema, non tender, no erythema  Skin: Warm dry intact with no signs of infection or rash on extremities or on axial skeleton.  Abdomen: Soft nontender  Neuro: Cranial nerves II through XII are intact, neurovascularly intact in all extremities with 2+ DTRs and 2+ pulses.  Lymph: No lymphadenopathy of posterior or anterior cervical chain or axillae bilaterally.  Gait normal with good balance and coordination.  MSK:  tender with full range of motion and good stability and symmetric strength and tone of shoulders, elbows, wrist, hipand ankles bilaterally.  Left knee exam shows the patient does have tender to palpation over the medial joint space.  Positive pain with McMurray's.  No gapping with resisted medial or lateral force.  ACL intact.  Back exam does have loss of lordosis.  Tender to palpation paraspinal musculature more in the lumbar sacral area.  Right greater than left near full range of motion.  Tightness with Corky Sox.  Negative straight leg test.  Limited musculoskeletal ultrasound was performed and interpreted by Lyndal Pulley  Limited ultrasound of the left knee patient's medial meniscus does have a degenerative tear with mild displacement more posterior than anterior of 25%.  After informed written and verbal consent, patient was seated on exam table. Left knee was prepped with alcohol swab and utilizing anterolateral approach, patient's left knee space was injected with 4:1  marcaine 0.5%: Kenalog 40mg /dL. Patient tolerated the procedure well without  immediate complications.  Osteopathic findings  T5 extended rotated and side bent right  L1 flexed rotated and side bent left  Sacrum right on right     Impression and Recommendations:     This case required medical decision making of moderate complexity. The above documentation has been reviewed and is accurate and complete Lyndal Pulley, DO       Note: This dictation was prepared with Dragon dictation along with smaller phrase technology. Any transcriptional errors that result from this process are unintentional.

## 2018-11-19 NOTE — Assessment & Plan Note (Signed)
Decision today to treat with OMT was based on Physical Exam  After verbal consent patient was treated with HVLA, ME, FPR techniques in  thoracic, lumbar and sacral areas  Patient tolerated the procedure well with improvement in symptoms  Patient given exercises, stretches and lifestyle modifications  See medications in patient instructions if given  Patient will follow up in 4-8 weeks 

## 2018-11-19 NOTE — Assessment & Plan Note (Signed)
Stable overall.  Discussed posture and ergonomics.  Discussed which activities to avoid.  Increase activity slowly.  Follow-up again in 4 to 8 weeks

## 2018-11-19 NOTE — Assessment & Plan Note (Signed)
Patient given injection and tolerated the procedure well.  Discussed with patient that icing regimen and home exercise, which activities to do which wants to avoid.  Follow-up again in 4 to 8 weeks

## 2018-11-19 NOTE — Patient Instructions (Addendum)
Good to see you Xray downstairs for the knee No twisting unless you are in the brace See me again in 6 weeks

## 2018-11-24 ENCOUNTER — Other Ambulatory Visit: Payer: Self-pay | Admitting: Internal Medicine

## 2018-11-24 DIAGNOSIS — I10 Essential (primary) hypertension: Secondary | ICD-10-CM

## 2018-12-03 ENCOUNTER — Other Ambulatory Visit: Payer: Self-pay | Admitting: Family Medicine

## 2018-12-03 DIAGNOSIS — S83412A Sprain of medial collateral ligament of left knee, initial encounter: Secondary | ICD-10-CM

## 2018-12-08 NOTE — Progress Notes (Signed)
Chief Complaint  Patient presents with  . Mass    on left middle finger patient noticed a few days ago says it is not painful but is hard as a rock     HPI: Christina [name] 58 y.o. come in for nodule noted on her left middle finger proximal phalanx near the PIP joint.  She noticed it at work just touching the area but had no associated pain injury numbness tingling or weakness of her hand.  She is pretty sure was not there in the past because she would have noticed it.  No history of recent or old trauma to that particular area.  Not sure she should do anything is really battling more of her cartilage injury in her knee.    Her blood pressure has been good she still on medication.  Does not believe she has had lab test done since she has been in the area.    ROS: See pertinent positives and negatives per HPI.  Past Medical History:  Diagnosis Date  . Arthritis   . Chicken pox     Family History  Problem Relation Age of Onset  . Cancer Mother   . Hyperlipidemia Mother   . Hypertension Mother   . Stroke Mother   . Cancer Father   . Hypertension Maternal Grandmother   . Hypertension Paternal Grandmother   . Stroke Paternal Grandmother   . Allergic rhinitis Neg Hx   . Asthma Neg Hx     Social History   Socioeconomic History  . Marital status: Married    Spouse name: Not on file  . Number of children: Not on file  . Years of education: Not on file  . Highest education level: Not on file  Occupational History  . Not on file  Social Needs  . Financial resource strain: Not on file  . Food insecurity    Worry: Not on file    Inability: Not on file  . Transportation needs    Medical: Not on file    Non-medical: Not on file  Tobacco Use  . Smoking status: Former Research scientist (life sciences)  . Smokeless tobacco: Never Used  Substance and Sexual Activity  . Alcohol use: Yes  . Drug use: No  . Sexual activity: Yes    Partners: Male  Lifestyle  . Physical activity    Days per week: Not on  file    Minutes per session: Not on file  . Stress: Not on file  Relationships  . Social Herbalist on phone: Not on file    Gets together: Not on file    Attends religious service: Not on file    Active member of club or organization: Not on file    Attends meetings of clubs or organizations: Not on file    Relationship status: Not on file  Other Topics Concern  . Not on file  Social History Narrative  . Not on file       EXAM:  BP 116/68 (BP Location: Right Arm, Patient Position: Sitting, Cuff Size: Normal)   Pulse 85   Temp 98.9 F (37.2 C) (Temporal)   Wt 139 lb 12.8 oz (63.4 kg)   SpO2 99%   BMI 25.57 kg/m   Body mass index is 25.57 kg/m.  GENERAL: vitals reviewed and listed above, alert, oriented, appears well hydrated and in no acute distress HEENT: atraumatic, conjunctiva  clear, no obvious abnormalities on inspection of external nose and ears OP masked NECK:  no obvious masses on inspection palpation  MS: moves all extremities without noticeable focal  abnormality knee is an elastic support left hand without obvious deformity by inspection on palpation there is a small pebble sized firm nodule at the distal end of the proximal phalanx.  No range of motion limitation in joint this may be minimally mobile on the tendon uncertain.  No joint swelling or ligament laxity grip is normal no weakness.  Examination of the right hand shows normal range of motion no deformity. PSYCH: pleasant and cooperative, no obvious depression or anxiety Lab Results  Component Value Date   WBC 10.2 02/18/2015   HGB 13.5 02/18/2015   HCT 41 02/18/2015   PLT 354 02/18/2015   ALT 36 (A) 02/18/2015   AST 26 02/18/2015   NA 142 02/18/2015   K 4.3 02/18/2015   CREATININE 0.7 02/18/2015   BUN 13 02/18/2015   BP Readings from Last 3 Encounters:  12/09/18 116/68  11/19/18 116/80  10/06/18 130/84   Wt Readings from Last 3 Encounters:  12/09/18 139 lb 12.8 oz (63.4 kg)   11/19/18 140 lb (63.5 kg)  10/06/18 146 lb (66.2 kg)     ASSESSMENT AND PLAN:  Discussed the following assessment and plan:  Nodule of finger of left hand  Need for hepatitis C screening test - Plan: Hepatitis C antibody  Urinary frequency - Plan: Hemoglobin A1c, T4, free, TSH, Lipid panel, Hepatic function panel, CBC with Differential/Platelet, Basic metabolic panel  Recurrent UTI - Plan: Hemoglobin A1c, T4, free, TSH, Lipid panel, Hepatic function panel, CBC with Differential/Platelet, Basic metabolic panel  Lumbar radiculopathy, right - Plan: Hemoglobin A1c, T4, free, TSH, Lipid panel, Hepatic function panel, CBC with Differential/Platelet, Basic metabolic panel  Hypertension, essential, benign - Plan: Hemoglobin A1c, T4, free, TSH, Lipid panel, Hepatic function panel, CBC with Differential/Platelet, Basic metabolic panel  Other fatigue - Plan: Hemoglobin A1c, T4, free, TSH, Lipid panel, Hepatic function panel, CBC with Differential/Platelet, Basic metabolic panel  Screening, lipid - Plan: Lipid panel  Medication management Nodule on finger palmar surface this is either a small ganglion cyst or an osteophyte but normal function no symptoms discussed possibility of x-ray but probably not helpful today will observe if develops symptoms growing or progression will reevaluate. Since she was here have her get her labs that were previously ordered for routine yearly she is overdue for this. She should make an appointment for CPX when convenient.  -Patient advised to return or notify health care team  if  new concerns arise.  Patient Instructions  I think this is a benign.  Condition cyst vs other since no sx    Lets just follow .   And if having any sx then let us know for reevaluation  of x ray .    Get your monitoring labs today .     Standley Brooking. Mohamed Portlock M.D. Allergies as of 12/09/2018   No Known Allergies     Medication List       Accurate as of December 09, 2018 12:52  PM. If you have any questions, ask your nurse or doctor.        STOP taking these medications   Pennsaid 2 % Soln Generic drug: Diclofenac Sodium Stopped by: Shanon Ace, MD   predniSONE 10 MG tablet Commonly known as: DELTASONE Stopped by: Shanon Ace, MD     TAKE these medications   azelastine 0.1 % nasal spray Commonly known as: ASTELIN Place 2 sprays into both nostrils  2 (two) times daily as needed for rhinitis. Use in each nostril as directed   cetirizine 10 MG tablet Commonly known as: ZYRTEC TAKE 1 TABLET(10 MG) BY MOUTH DAILY   losartan 50 MG tablet Commonly known as: COZAAR TAKE 1 TABLET(50 MG) BY MOUTH DAILY   meloxicam 15 MG tablet Commonly known as: MOBIC TAKE 1 TABLET(15 MG) BY MOUTH DAILY   PROBIOTIC PO Take 1 tablet by mouth daily as needed.   solifenacin 5 MG tablet Commonly known as: VESICARE TAKE 1 TABLET(5 MG) BY MOUTH DAILY   VITAMIN D PO Take 1 tablet by mouth daily.

## 2018-12-09 ENCOUNTER — Encounter: Payer: Self-pay | Admitting: Internal Medicine

## 2018-12-09 ENCOUNTER — Other Ambulatory Visit: Payer: Self-pay

## 2018-12-09 ENCOUNTER — Ambulatory Visit (INDEPENDENT_AMBULATORY_CARE_PROVIDER_SITE_OTHER): Payer: 59 | Admitting: Internal Medicine

## 2018-12-09 VITALS — BP 116/68 | HR 85 | Temp 98.9°F | Wt 139.8 lb

## 2018-12-09 DIAGNOSIS — R350 Frequency of micturition: Secondary | ICD-10-CM

## 2018-12-09 DIAGNOSIS — R5383 Other fatigue: Secondary | ICD-10-CM

## 2018-12-09 DIAGNOSIS — R2232 Localized swelling, mass and lump, left upper limb: Secondary | ICD-10-CM | POA: Diagnosis not present

## 2018-12-09 DIAGNOSIS — Z1159 Encounter for screening for other viral diseases: Secondary | ICD-10-CM | POA: Diagnosis not present

## 2018-12-09 DIAGNOSIS — Z79899 Other long term (current) drug therapy: Secondary | ICD-10-CM

## 2018-12-09 DIAGNOSIS — I10 Essential (primary) hypertension: Secondary | ICD-10-CM

## 2018-12-09 DIAGNOSIS — M5416 Radiculopathy, lumbar region: Secondary | ICD-10-CM

## 2018-12-09 DIAGNOSIS — N390 Urinary tract infection, site not specified: Secondary | ICD-10-CM

## 2018-12-09 DIAGNOSIS — Z13220 Encounter for screening for lipoid disorders: Secondary | ICD-10-CM

## 2018-12-09 LAB — BASIC METABOLIC PANEL
BUN: 13 mg/dL (ref 6–23)
CO2: 28 mEq/L (ref 19–32)
Calcium: 9.5 mg/dL (ref 8.4–10.5)
Chloride: 108 mEq/L (ref 96–112)
Creatinine, Ser: 0.85 mg/dL (ref 0.40–1.20)
GFR: 68.19 mL/min (ref >60.00)
Glucose, Bld: 110 mg/dL — ABNORMAL HIGH (ref 70–99)
Potassium: 3.8 mEq/L (ref 3.5–5.1)
Sodium: 143 mEq/L (ref 135–145)

## 2018-12-09 LAB — CBC WITH DIFFERENTIAL/PLATELET
Basophils Absolute: 0.1 10*3/uL (ref 0.0–0.1)
Basophils Relative: 0.9 % (ref 0.0–3.0)
Eosinophils Absolute: 0.1 10*3/uL (ref 0.0–0.7)
Eosinophils Relative: 1 % (ref 0.0–5.0)
HCT: 39.7 % (ref 36.0–46.0)
Hemoglobin: 13.2 g/dL (ref 12.0–15.0)
Lymphocytes Relative: 29.7 % (ref 12.0–46.0)
Lymphs Abs: 2 10*3/uL (ref 0.7–4.0)
MCHC: 33.3 g/dL (ref 30.0–36.0)
MCV: 93 fl (ref 78.0–100.0)
Monocytes Absolute: 0.4 10*3/uL (ref 0.1–1.0)
Monocytes Relative: 6.1 % (ref 3.0–12.0)
Neutro Abs: 4.2 10*3/uL (ref 1.4–7.7)
Neutrophils Relative %: 62.3 % (ref 43.0–77.0)
Platelets: 373 10*3/uL (ref 150.0–400.0)
RBC: 4.27 Mil/uL (ref 3.87–5.11)
RDW: 13.6 % (ref 11.5–15.5)
WBC: 6.7 10*3/uL (ref 4.0–10.5)

## 2018-12-09 LAB — HEPATIC FUNCTION PANEL
ALT: 18 U/L (ref 0–35)
AST: 17 U/L (ref 0–37)
Albumin: 4.3 g/dL (ref 3.5–5.2)
Alkaline Phosphatase: 47 U/L (ref 39–117)
Bilirubin, Direct: 0.2 mg/dL (ref 0.0–0.3)
Total Bilirubin: 0.9 mg/dL (ref 0.2–1.2)
Total Protein: 6.5 g/dL (ref 6.0–8.3)

## 2018-12-09 LAB — LIPID PANEL
Cholesterol: 209 mg/dL — ABNORMAL HIGH (ref 0–200)
HDL: 64 mg/dL (ref >39.00)
LDL Cholesterol: 130 mg/dL — ABNORMAL HIGH (ref 0–99)
NonHDL: 145.03
Total CHOL/HDL Ratio: 3
Triglycerides: 75 mg/dL (ref 0.0–149.0)
VLDL: 15 mg/dL (ref 0.0–40.0)

## 2018-12-09 LAB — HEMOGLOBIN A1C: Hgb A1c MFr Bld: 5.7 % (ref 4.6–6.5)

## 2018-12-09 LAB — TSH: TSH: 1.1 u[IU]/mL (ref 0.35–4.50)

## 2018-12-09 LAB — T4, FREE: Free T4: 0.69 ng/dL (ref 0.60–1.60)

## 2018-12-09 NOTE — Patient Instructions (Signed)
I think this is a benign.  Condition cyst vs other since no sx    Lets just follow .   And if having any sx then let us know for reevaluation  of x ray .    Get your monitoring labs today .

## 2018-12-10 LAB — HEPATITIS C ANTIBODY
Hepatitis C Ab: NONREACTIVE
SIGNAL TO CUT-OFF: 0.01 (ref <1.00)

## 2018-12-25 ENCOUNTER — Ambulatory Visit: Payer: 59

## 2019-01-01 ENCOUNTER — Ambulatory Visit (INDEPENDENT_AMBULATORY_CARE_PROVIDER_SITE_OTHER): Payer: 59

## 2019-01-01 ENCOUNTER — Other Ambulatory Visit: Payer: Self-pay

## 2019-01-01 ENCOUNTER — Other Ambulatory Visit: Payer: Self-pay | Admitting: Family Medicine

## 2019-01-01 ENCOUNTER — Other Ambulatory Visit: Payer: Self-pay | Admitting: Internal Medicine

## 2019-01-01 DIAGNOSIS — Z23 Encounter for immunization: Secondary | ICD-10-CM

## 2019-01-01 DIAGNOSIS — S83412A Sprain of medial collateral ligament of left knee, initial encounter: Secondary | ICD-10-CM

## 2019-01-01 NOTE — Progress Notes (Signed)
Pt tolerated shot well 

## 2019-01-04 ENCOUNTER — Other Ambulatory Visit: Payer: Self-pay

## 2019-01-04 ENCOUNTER — Ambulatory Visit: Payer: 59 | Admitting: Family Medicine

## 2019-01-04 ENCOUNTER — Encounter: Payer: Self-pay | Admitting: Family Medicine

## 2019-01-04 ENCOUNTER — Ambulatory Visit: Payer: Self-pay

## 2019-01-04 VITALS — BP 128/84 | HR 81 | Wt 141.0 lb

## 2019-01-04 DIAGNOSIS — M999 Biomechanical lesion, unspecified: Secondary | ICD-10-CM | POA: Diagnosis not present

## 2019-01-04 DIAGNOSIS — G8929 Other chronic pain: Secondary | ICD-10-CM

## 2019-01-04 DIAGNOSIS — M25562 Pain in left knee: Secondary | ICD-10-CM

## 2019-01-04 DIAGNOSIS — S83242D Other tear of medial meniscus, current injury, left knee, subsequent encounter: Secondary | ICD-10-CM | POA: Diagnosis not present

## 2019-01-04 DIAGNOSIS — M5416 Radiculopathy, lumbar region: Secondary | ICD-10-CM

## 2019-01-04 NOTE — Progress Notes (Signed)
[facility] [address] [place], [place] [address] Phone: [phone] Subjective:   Christina [name], am serving as a scribe for Dr. Hulan Saas. I'm seeing this patient by the request  of:    CC: Knee pain  [id number]   11/19/2018 Patient given injection and tolerated the procedure well.  Discussed with patient that icing regimen and home exercise, which activities to do which wants to avoid.  Follow-up again in 4 to 8 weeks  Update 01/04/2019 Christina [name] is a 58 y.o. female coming in with complaint of left knee pain. Patient states that after her injection her pain improved slightly but is still having achy pain over medial joint line. Patient states that her pain increases with prolonged sitting or when she gets up in the morning. Has not been playing tennis      Past Medical History:  Diagnosis Date  . Arthritis   . Chicken pox    Past Surgical History:  Procedure Laterality Date  . CHOLECYSTECTOMY     Social History   Socioeconomic History  . Marital status: Married    Spouse name: Not on file  . Number of children: Not on file  . Years of education: Not on file  . Highest education level: Not on file  Occupational History  . Not on file  Social Needs  . Financial resource strain: Not on file  . Food insecurity    Worry: Not on file    Inability: Not on file  . Transportation needs    Medical: Not on file    Non-medical: Not on file  Tobacco Use  . Smoking status: Former Research scientist (life sciences)  . Smokeless tobacco: Never Used  Substance and Sexual Activity  . Alcohol use: Yes  . Drug use: [name]  . Sexual activity: Yes    Partners: Male  Lifestyle  . Physical activity    Days per week: Not on file    Minutes per session: Not on file  . Stress: Not on file  Relationships  . Social Herbalist on phone: Not on file    Gets together: Not on file    Attends religious service: Not on file    Active member of club or organization:  Not on file    Attends meetings of clubs or organizations: Not on file    Relationship status: Not on file  Other Topics Concern  . Not on file  Social History Narrative  . Not on file   [name] Known Allergies Family History  Problem Relation Age of Onset  . Cancer Mother   . Hyperlipidemia Mother   . Hypertension Mother   . Stroke Mother   . Cancer Father   . Hypertension Maternal Grandmother   . Hypertension Paternal Grandmother   . Stroke Paternal Grandmother   . Allergic rhinitis Neg Hx   . Asthma Neg Hx      Current Outpatient Medications (Cardiovascular):  .  losartan (COZAAR) 50 MG tablet, TAKE 1 TABLET(50 MG) BY MOUTH DAILY  Current Outpatient Medications (Respiratory):  .  cetirizine (ZYRTEC) 10 MG tablet, TAKE 1 TABLET(10 MG) BY MOUTH DAILY .  azelastine (ASTELIN) 0.1 % nasal spray, Place 2 sprays into both nostrils 2 (two) times daily as needed for rhinitis. Use in each nostril as directed  Current Outpatient Medications (Analgesics):  .  meloxicam (MOBIC) 15 MG tablet, TAKE 1 TABLET(15 MG) BY MOUTH DAILY   Current Outpatient Medications (Other):  .  Cholecalciferol (VITAMIN D PO), Take 1 tablet by mouth daily. .  Probiotic Product (PROBIOTIC PO), Take 1 tablet by mouth daily as needed. .  solifenacin (VESICARE) 5 MG tablet, TAKE 1 TABLET(5 MG) BY MOUTH DAILY    Past medical history, social, surgical and family history all reviewed in electronic medical record.  [name] pertanent information unless stated regarding to the chief complaint.   Review of Systems:  [name] headache, visual changes, nausea, vomiting, diarrhea, constipation, dizziness, abdominal pain, skin rash, fevers, chills, night sweats, weight loss, swollen lymph nodes, body aches, joint swelling, muscle aches, chest pain, shortness of breath, mood changes.   Objective  Blood pressure 128/84, pulse 81, weight 141 lb (64 kg), SpO2 98 %.    General: [name] apparent distress alert and oriented x3 mood and  affect normal, dressed appropriately.  HEENT: Pupils equal, extraocular movements intact  Respiratory: Patient's speak in full sentences and does not appear short of breath  Cardiovascular: [name] lower extremity edema, non tender, [name] erythema  Skin: Warm dry intact with [name] signs of infection or rash on extremities or on axial skeleton.  Abdomen: Soft nontender  Neuro: Cranial nerves II through XII are intact, neurovascularly intact in all extremities with 2+ DTRs and 2+ pulses.  Lymph: [name] lymphadenopathy of posterior or anterior cervical chain or axillae bilaterally.  Gait normal with good balance and coordination.  MSK:  Non tender with full range of motion and good stability and symmetric strength and tone of shoulders, elbows, wrist, hip, and ankles bilaterally.  Left knee exam now lacks the last 2 degrees of extension.  Severe tenderness to palpation over the medial joint line.  Positive McMurray's noted.  Near full flexion are noted.  MCL and ACL appear to be intact  Back exam does have some loss of lordosis.  Positive FABER test bilaterally.  Patient has negative straight leg test with tightness of the hamstrings.  Osteopathic findings  C2 flexed rotated and side bent right T3 extended rotated and side bent right inhaled third rib T9 extended rotated and side bent left L3 flexed rotated and side bent right Sacrum right on right     Impression and Recommendations:     This case required medical decision making of moderate complexity. The above documentation has been reviewed and is accurate and complete Lyndal Pulley, DO       Note: This dictation was prepared with Dragon dictation along with smaller phrase technology. Any transcriptional errors that result from this process are unintentional.

## 2019-01-04 NOTE — Assessment & Plan Note (Signed)
No radicular symptoms.  Has been doing well with home exercises and icing regimen.  Discussed which activities to do which wants to avoid.  Patient will increase activity slowly.\ Manipulation

## 2019-01-04 NOTE — Assessment & Plan Note (Signed)
Patient did not have any significant improvement with the injection.  Continues to have some instability.  We discussed which activities to do which wants to avoid.  Patient is to increase activity slowly.  With the stability of the knee and not making improvement I do feel an MRI is necessary.  Patient would consider the possibility of surgical intervention if necessary.  Patient could also be a candidate for viscosupplementation but he feels advanced imaging would be necessary.  Follow-up again in 4 to 6 weeks

## 2019-01-04 NOTE — Assessment & Plan Note (Signed)
Decision today to treat with OMT was based on Physical Exam  After verbal consent patient was treated with HVLA, ME, FPR techniques in  thoracic, lumbar and sacral areas  Patient tolerated the procedure well with improvement in symptoms  Patient given exercises, stretches and lifestyle modifications  See medications in patient instructions if given  Patient will follow up in 4-8 weeks 

## 2019-01-12 ENCOUNTER — Ambulatory Visit (INDEPENDENT_AMBULATORY_CARE_PROVIDER_SITE_OTHER): Payer: 59 | Admitting: Allergy & Immunology

## 2019-01-12 ENCOUNTER — Encounter: Payer: Self-pay | Admitting: Allergy & Immunology

## 2019-01-12 ENCOUNTER — Other Ambulatory Visit: Payer: Self-pay

## 2019-01-12 VITALS — BP 118/78 | HR 85 | Temp 97.9°F | Resp 16 | Ht 60.5 in | Wt 140.2 lb

## 2019-01-12 DIAGNOSIS — L508 Other urticaria: Secondary | ICD-10-CM | POA: Diagnosis not present

## 2019-01-12 DIAGNOSIS — J302 Other seasonal allergic rhinitis: Secondary | ICD-10-CM

## 2019-01-12 DIAGNOSIS — J3089 Other allergic rhinitis: Secondary | ICD-10-CM | POA: Diagnosis not present

## 2019-01-12 MED ORDER — LEVOCETIRIZINE DIHYDROCHLORIDE 5 MG PO TABS: 5.0000 mg | ORAL_TABLET | Freq: Every evening | ORAL | 5 refills | Status: DC

## 2019-01-12 MED ORDER — IPRATROPIUM BROMIDE 0.06 % NA SOLN: 2.0000 | Freq: Three times a day (TID) | NASAL | 5 refills | Status: DC

## 2019-01-12 NOTE — Patient Instructions (Addendum)
1. Seasonal and perennial allergic rhinitis -Continue with cetirizine 1 to 2 tablets daily. -Consider changing antihistamines every few months (Allegra, Zyrtec, Xyzal). -Continue with Astelin two sprays per nostril up to twice daily daily. -Add on nasal ipratropium one spray per nostril up to three times daily (this can be overdrying).  -Contact us with any questions or concerns.   2. Chronic urticaria -Continue with the antihistamines as above.  3. Return in about 1 year (around 01/12/2020). This can be an in-person, a virtual Webex or a telephone follow up visit.   Please inform us of any Emergency Department visits, hospitalizations, or changes in symptoms. Call us before going to the ED for breathing or allergy symptoms since we might be able to fit you in for a sick visit. Feel free to contact us anytime with any questions, problems, or concerns.  It was a pleasure to see you again today!  Websites that have reliable patient information: 1. American Academy of Asthma, Allergy, and Immunology: www.aaaai.org 2. Food Allergy Research and Education (FARE): foodallergy.org 3. Mothers of Asthmatics: http://www.asthmacommunitynetwork.org 4. American College of Allergy, Asthma, and Immunology: www.acaai.org  "Like" [place] on Facebook and Instagram for our latest updates!      Make sure you are registered to vote! If you have moved or changed any of your contact information, you will need to get this updated before voting!  In some cases, you MAY be able to register to vote online: [email or web address]

## 2019-01-12 NOTE — Progress Notes (Signed)
FOLLOW UP  Date of Service/Encounter:  01/12/19   Assessment:   Seasonal and perennial allergic rhinitis(weeds, grasses, indoor molds, dust mites, cat, dog and cockroach) - with worsening symptoms in the fall   Chronic urticaria  Plan/Recommendations:   1. Seasonal and perennial allergic rhinitis (weeds, grasses, indoor molds, dust mites, cat, dog and cockroach) -Continue with cetirizine 1 to 2 tablets daily. -Consider changing antihistamines every few months (Allegra, Zyrtec, Xyzal). -Continue with Astelin two sprays per nostril up to twice daily daily. -Add on nasal ipratropium one spray per nostril up to three times daily (this can be overdrying).  -Contact us with any questions or concerns.   2. Chronic urticaria -Continue with the antihistamines as above.  3. Return in about 1 year (around 01/12/2020). This can be an in-person, a virtual Webex or a telephone follow up visit.    Subjective:   Christina [name] is a 58 y.o. female presenting today for follow up of  Chief Complaint  Patient presents with  . Allergic Rhinitis     Christina [name] has a history of the following: Patient Active Problem List   Diagnosis Date Noted  . Acute medial meniscus tear of left knee 11/19/2018  . Sprain of medial collateral ligament of left knee 09/01/2018  . Neck strain, initial encounter 08/28/2018  . Degenerative disc disease, cervical 08/28/2018  . Lumbar radiculopathy, right 03/05/2018  . Right lateral epicondylitis 10/30/2017  . SI (sacroiliac) joint dysfunction 10/30/2017  . Nonallopathic lesion of sacral region 10/30/2017  . Nonallopathic lesion of lumbar region 10/30/2017  . Nonallopathic lesion of thoracic region 10/30/2017  . History of recurrent UTIs 04/11/2017  . 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 Allergic rhinitis 10/29/2016    History obtained from: chart review and patient.  Christina [name] is a 58 y.o. female presenting for a follow up visit.  She was last seen in May  2020 as a telephone visit.  At that time, we continued her cetirizine 1 to 2 tablets daily as well as Nasacort 1 to 2 sprays per nostril daily.  We did discuss allergen immunotherapy as a means of long-term control.  For her urticaria, we continued with antihistamines.  Since the last visit, she has mostly done well.   She did go to [place] a few weeks ago (drove and stayed in an [employer]). She developed some symptoms when she went down there and she is now getting over it. Now she is turning a corner and she cannot seem to clear her symptoms up.  She remains on the nasal steroid as well as Astelin.  She has never been on nasal ipratropium but is open to new ideas.  Her last testing was done in 2019 and was positive to weeds, grasses, indoor molds, dust mites, cat, dog and cockroach.  She was never interested in allergen immunotherapy, so we never did additional testing.  Overall, it seems that the fall in the spring seem to be the worst times of the year for her.  She is able to handle the symptoms in the spring, but the symptoms in the fall seem to be more persistent.  She does routinely need around 1 course of prednisone per year for her allergy symptoms.  She denies any sinus pain or pressure.  She has had no discolored mucus.  Her postnasal drip is more of a tickle in her throat rather than a severe coughing episode.  She has not been around any COVID-19 positive people.  Her son is  in school at the [employer] and did contract Covid when he was there.  He was evidently pretty sick for around 4 to 5 days, but he did make a full recovery and did not require hospitalization.  She is going to be seeing her son in the next week or so.  His infection was in September.  Otherwise, there have been no changes to her past medical history, surgical history, family history, or social history.    Review of Systems  Constitutional: Negative.  Negative for fever, malaise/fatigue and weight loss.   HENT: Negative.  Negative for congestion, ear discharge, ear pain and sinus pain.        Positive for postnasal drip.  Eyes: Negative for pain, discharge and redness.  Respiratory: Negative for cough, sputum production, shortness of breath and wheezing.   Cardiovascular: Negative.  Negative for chest pain and palpitations.  Gastrointestinal: Negative for abdominal pain, constipation, diarrhea, heartburn, nausea and vomiting.  Skin: Negative.  Negative for itching and rash.  Neurological: Negative for dizziness and headaches.  Endo/Heme/Allergies: Positive for environmental allergies. Does not bruise/bleed easily.       Objective:   Blood pressure 118/78, pulse 85, temperature 97.9 F (36.6 C), temperature source Temporal, resp. rate 16, height 5' 0.5" (1.537 m), weight 140 lb 3.2 oz (63.6 kg), SpO2 98 %. Body mass index is 26.93 kg/m.   Physical Exam:  Physical Exam  Constitutional: She appears well-developed.  HENT:  Head: Normocephalic and atraumatic.  Right Ear: Tympanic membrane, external ear and ear canal normal.  Left Ear: Tympanic membrane and ear canal normal.  Nose: No mucosal edema, rhinorrhea, nasal deformity or septal deviation. No epistaxis. Right sinus exhibits no maxillary sinus tenderness and no frontal sinus tenderness. Left sinus exhibits no maxillary sinus tenderness and no frontal sinus tenderness.  Mouth/Throat: Uvula is midline and oropharynx is clear and moist. Mucous membranes are not pale and not dry.  Marked cobblestoning in the posterior oropharynx.  Tonsils are normal bilaterally without discharge.  No sinus tenderness.  Eyes: Pupils are equal, round, and reactive to light. Conjunctivae and EOM are normal. Right eye exhibits no chemosis and no discharge. Left eye exhibits no chemosis and no discharge. Right conjunctiva is not injected. Left conjunctiva is not injected.  Cardiovascular: Normal rate, regular rhythm and normal heart sounds.  Respiratory:  Effort normal and breath sounds normal. No accessory muscle usage. No tachypnea. No respiratory distress. She has no wheezes. She has no rhonchi. She has no rales. She exhibits no tenderness.  Moving air well in all lung fields.  Lymphadenopathy:    She has no cervical adenopathy.  Neurological: She is alert.  Skin: No abrasion, no petechiae and no rash noted. Rash is not papular, not vesicular and not urticarial. No erythema. No pallor.  No urticarial or eczematous lesions noted.  Psychiatric: She has a normal mood and affect.     Diagnostic studies: none   Salvatore Marvel, MD  [facility]

## 2019-01-17 ENCOUNTER — Encounter: Payer: Self-pay | Admitting: Allergy & Immunology

## 2019-01-18 NOTE — Telephone Encounter (Signed)
Dr. Gallagher please advise.  

## 2019-01-19 ENCOUNTER — Encounter: Payer: Self-pay | Admitting: Family Medicine

## 2019-01-25 ENCOUNTER — Other Ambulatory Visit: Payer: 59

## 2019-01-27 ENCOUNTER — Other Ambulatory Visit: Payer: Self-pay

## 2019-01-27 ENCOUNTER — Encounter: Payer: Self-pay | Admitting: *Deleted

## 2019-01-27 ENCOUNTER — Telehealth: Payer: Self-pay | Admitting: *Deleted

## 2019-01-27 DIAGNOSIS — Z20822 Contact with and (suspected) exposure to covid-19: Secondary | ICD-10-CM

## 2019-01-27 NOTE — Telephone Encounter (Signed)
Patient advised to get tested due to positive exposure.  Copied from [place] [id number]. Topic: General - Other >> Jan 27, 2019  1:56 PM Keene Breath wrote: Reason for CRM: Patient called to ask the nurse what should she do after being exposed to someone who tested positive for COVID on Sunday.  Please advise and call to discuss at [phone]

## 2019-01-29 LAB — NOVEL CORONAVIRUS, NAA: SARS-CoV-2, NAA: NOT DETECTED

## 2019-02-01 ENCOUNTER — Encounter (HOSPITAL_COMMUNITY): Payer: Self-pay

## 2019-02-01 ENCOUNTER — Other Ambulatory Visit: Payer: Self-pay

## 2019-02-01 ENCOUNTER — Telehealth: Payer: Self-pay

## 2019-02-01 ENCOUNTER — Ambulatory Visit (HOSPITAL_COMMUNITY)
Admission: EM | Admit: 2019-02-01 | Discharge: 2019-02-01 | Disposition: A | Discharge status: Home / Self Care | Payer: 59 | Attending: Family Medicine | Admitting: Family Medicine

## 2019-02-01 DIAGNOSIS — N3090 Cystitis, unspecified without hematuria: Secondary | ICD-10-CM | POA: Insufficient documentation

## 2019-02-01 MED ORDER — NITROFURANTOIN MONOHYD MACRO 100 MG PO CAPS: 100.0000 mg | ORAL_CAPSULE | Freq: Two times a day (BID) | ORAL | 0 refills | Status: DC

## 2019-02-01 NOTE — Telephone Encounter (Signed)
Spoke with patient, unable to schedule virtual visit due to patient having recent COVID testing 9 days ago and being around positive son-in-law. Patient could not come into the office to bring urine sample. Patient stated that she would go to UC or ER since she couldn't come into the office to do urine sample.

## 2019-02-01 NOTE — Telephone Encounter (Signed)
Called pt twice with no answer. 

## 2019-02-01 NOTE — Discharge Instructions (Signed)
Drink plenty of fluids Take the macrobid as directed 2 x a day This antibiotic usually covers enterococcus We will call if the culture is positive

## 2019-02-01 NOTE — ED Triage Notes (Signed)
Pt present  Possible unable to urinate on a regular. Pt states she sometimes can go to the bathroom and sometimes not. Last time she went was 5 hrs ago.

## 2019-02-01 NOTE — Telephone Encounter (Signed)
Copied from [place] [id number]. Topic: Appointment Scheduling - Scheduling Inquiry for Clinic >> Feb 01, 2019 10:15 AM Rayann Heman wrote: Reason for CRM: pt called and stated that she would like to schedule an appointment for a possible uti. Please advise

## 2019-02-01 NOTE — ED Provider Notes (Signed)
[facility]    CSN: [id number] Arrival date & time: 02/01/19  1539      History   Chief Complaint Chief Complaint  Patient presents with  . Urinary Tract Infection    HPI Christina [name] is a 58 y.o. female.   HPI  History of recurrent UTI Last UTI was enterococcus faecalis Wants antibiotic to cover Caller her PCP who told her to come to the Urgent care No fever or chills or abdominal/flank pain  Past Medical History:  Diagnosis Date  . Arthritis   . Chicken pox     Patient Active Problem List   Diagnosis Date Noted  . Acute medial meniscus tear of left knee 11/19/2018  . Sprain of medial collateral ligament of left knee 09/01/2018  . Neck strain, initial encounter 08/28/2018  . Degenerative disc disease, cervical 08/28/2018  . Lumbar radiculopathy, right 03/05/2018  . Right lateral epicondylitis 10/30/2017  . SI (sacroiliac) joint dysfunction 10/30/2017  . Nonallopathic lesion of sacral region 10/30/2017  . Nonallopathic lesion of lumbar region 10/30/2017  . Nonallopathic lesion of thoracic region 10/30/2017  . History of recurrent UTIs 04/11/2017  . 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 Allergic rhinitis 10/29/2016    Past Surgical History:  Procedure Laterality Date  . CHOLECYSTECTOMY      OB History   No obstetric history on file.      Home Medications    Prior to Admission medications   Medication Sig Start Date End Date Taking? Authorizing Provider  azelastine (ASTELIN) 0.1 % nasal spray Place 2 sprays into both nostrils 2 (two) times daily as needed for rhinitis. Use in each nostril as directed 11/20/17 01/12/28  Valentina Shaggy, MD  cetirizine (ZYRTEC) 10 MG tablet TAKE 1 TABLET(10 MG) BY MOUTH DAILY 10/06/18   Valentina Shaggy, MD  Cholecalciferol (VITAMIN D PO) Take 1 tablet by mouth daily.    [provider]  ipratropium (ATROVENT) 0.06 % nasal spray Place 2 sprays into both nostrils 3 (three) times daily.  01/12/19 02/11/19  Valentina Shaggy, MD  levocetirizine (XYZAL) 5 MG tablet Take 1 tablet (5 mg total) by mouth every evening. 01/12/19 02/11/19  Valentina Shaggy, MD  losartan (COZAAR) 50 MG tablet TAKE 1 TABLET(50 MG) BY MOUTH DAILY 11/24/18   Panosh, Standley Brooking, MD  Probiotic Product (PROBIOTIC PO) Take 1 tablet by mouth daily as needed.    [provider]  solifenacin (VESICARE) 5 MG tablet TAKE 1 TABLET(5 MG) BY MOUTH DAILY 01/01/19   Panosh, Standley Brooking, MD    Family History Family History  Problem Relation Age of Onset  . Cancer Mother   . Hyperlipidemia Mother   . Hypertension Mother   . Stroke Mother   . Cancer Father   . Hypertension Maternal Grandmother   . Hypertension Paternal Grandmother   . Stroke Paternal Grandmother   . Allergic rhinitis Neg Hx   . Asthma Neg Hx     Social History Social History   Tobacco Use  . Smoking status: Former Research scientist (life sciences)  . Smokeless tobacco: Never Used  Substance Use Topics  . Alcohol use: Yes  . Drug use: No     Allergies   Patient has no known allergies.   Review of Systems Review of Systems  Constitutional: Negative for chills and fever.  HENT: Negative for congestion and hearing loss.   Eyes: Negative for pain.  Respiratory: Negative for cough and shortness of breath.   Cardiovascular: Negative for chest pain  and leg swelling.  Gastrointestinal: Negative for abdominal pain, constipation and diarrhea.  Genitourinary: Positive for dysuria. Negative for frequency.  Musculoskeletal: Negative for myalgias.  Neurological: Negative for dizziness, seizures and headaches.  Psychiatric/Behavioral: The patient is not nervous/anxious.      Physical Exam Triage Vital Signs ED Triage Vitals  Enc Vitals Group     BP 02/01/19 1654 133/85     Pulse Rate 02/01/19 1654 84     Resp 02/01/19 1654 16     Temp 02/01/19 1654 98.2 F (36.8 C)     Temp Source 02/01/19 1654 Oral     SpO2 02/01/19 1654 100 %     Weight --       Height --      Head Circumference --      Peak Flow --      Pain Score 02/01/19 1655 0     Pain Loc --      Pain Edu? --      Excl. in [place]? --    No data found.  Updated Vital Signs BP 133/85 (BP Location: Right Arm)   Pulse 84   Temp 98.2 F (36.8 C) (Oral)   Resp 16   SpO2 100%      Physical Exam Constitutional:      General: She is not in acute distress.    Appearance: She is well-developed.  HENT:     Head: Normocephalic and atraumatic.  Eyes:     Conjunctiva/sclera: Conjunctivae normal.     Pupils: Pupils are equal, round, and reactive to light.  Cardiovascular:     Rate and Rhythm: Normal rate.     Heart sounds: Normal heart sounds.  Pulmonary:     Effort: Pulmonary effort is normal. No respiratory distress.     Breath sounds: Normal breath sounds.  Abdominal:     General: Abdomen is flat. There is no distension.     Palpations: Abdomen is soft.     Tenderness: There is no abdominal tenderness. There is no right CVA tenderness or left CVA tenderness.  Musculoskeletal:        General: Normal range of motion.     Cervical back: Normal range of motion.  Skin:    General: Skin is warm and dry.  Neurological:     Mental Status: She is alert.  Psychiatric:        Mood and Affect: Mood normal.        Behavior: Behavior normal.      UC Treatments / Results  Labs (all labs ordered are listed, but only abnormal results are displayed) Labs Reviewed  URINE CULTURE    EKG   Radiology No results found.  Procedures Procedures (including critical care time)  Medications Ordered in UC Medications - No data to display  Initial Impression / Assessment and Plan / UC Course  I have reviewed the triage vital signs and the nursing notes.  Pertinent labs & imaging results that were available during my care of the patient were reviewed by me and considered in my medical decision making (see chart for details).     UA is neg.  Will culture and treat based on  Sx Final Clinical Impressions(s) / UC Diagnoses   Final diagnoses:  Cystitis     Discharge Instructions     Drink plenty of fluids Take the macrobid as directed 2 x a day This antibiotic usually covers enterococcus We will call if the culture is positive    ED  Prescriptions    None     PDMP not reviewed this encounter.   Raylene Everts, MD 02/01/19 1739

## 2019-02-02 LAB — POCT URINALYSIS DIP (DEVICE)
Bilirubin Urine: NEGATIVE
Glucose, UA: NEGATIVE mg/dL
Ketones, ur: NEGATIVE mg/dL
Leukocytes,Ua: NEGATIVE
Nitrite: NEGATIVE
Protein, ur: NEGATIVE mg/dL
Specific Gravity, Urine: 1.02 (ref 1.005–1.030)
Urobilinogen, UA: 0.2 mg/dL (ref 0.0–1.0)
pH: 5.5 (ref 5.0–8.0)

## 2019-02-03 LAB — URINE CULTURE: Culture: >=100000 — AB

## 2019-02-08 ENCOUNTER — Other Ambulatory Visit: Payer: 59

## 2019-02-08 NOTE — Progress Notes (Signed)
Virtual Visit via Video Note  I connected with@ on 12 22 20  at  3:30 PM EST by a video enabled telemedicine application and verified that I am speaking with the correct person using two identifiers. Location patient: home Location provider:home   office Persons participating in the virtual visit: patient, provider  [place] national recommendations  regarding COVID 19 pandemic   video visit is advised over in office visit for this patient.  Patient aware  of the limitations of evaluation and management by telemedicine and  availability of in person appointments. and agreed to proceed.   HPI: Christina [name] presents for video visit because of technical difficulties with the sound visit was completed with a phone communication. When I was out of the office she had an episode of severe fatigue without fever or chills but urinary difficulties , making difficulty urinating.  This was like her previous UTIs. Because of a family exposure to Covid although she tested negative and was on her fifth day with no symptoms she had to seek care in the urgent care. At that time urine was consistent with UTI  12 14 culture showed E. coli pansensitive and she was treated with Macrobid for 7 days. At this time her symptoms are resolved but she question about the E. coli germ and that she had extreme fatigue and sleepiness during this time now recovered.   ROS: See pertinent positives and negatives per HPI.  Had a short time of tingling numbness sensation on the right lateral thigh without associated weakness pain or difficulty since now resolved.   Past Medical History:  Diagnosis Date  . Arthritis   . Chicken pox     Past Surgical History:  Procedure Laterality Date  . CHOLECYSTECTOMY      Family History  Problem Relation Age of Onset  . Cancer Mother   . Hyperlipidemia Mother   . Hypertension Mother   . Stroke Mother   . Cancer Father   . Hypertension Maternal Grandmother   . Hypertension Paternal  Grandmother   . Stroke Paternal Grandmother   . Allergic rhinitis Neg Hx   . Asthma Neg Hx       Current Outpatient Medications:  .  azelastine (ASTELIN) 0.1 % nasal spray, Place 2 sprays into both nostrils 2 (two) times daily as needed for rhinitis. Use in each nostril as directed, Disp: 30 mL, Rfl: 5 .  cetirizine (ZYRTEC) 10 MG tablet, TAKE 1 TABLET(10 MG) BY MOUTH DAILY, Disp: 30 tablet, Rfl: 5 .  Cholecalciferol (VITAMIN D PO), Take 1 tablet by mouth daily., Disp: , Rfl:  .  ipratropium (ATROVENT) 0.06 % nasal spray, Place 2 sprays into both nostrils 3 (three) times daily., Disp: 18 mL, Rfl: 5 .  levocetirizine (XYZAL) 5 MG tablet, Take 1 tablet (5 mg total) by mouth every evening., Disp: 30 tablet, Rfl: 5 .  losartan (COZAAR) 50 MG tablet, TAKE 1 TABLET(50 MG) BY MOUTH DAILY, Disp: 90 tablet, Rfl: 0 .  nitrofurantoin, macrocrystal-monohydrate, (MACROBID) 100 MG capsule, Take 1 capsule (100 mg total) by mouth 2 (two) times daily., Disp: 14 capsule, Rfl: 0 .  Probiotic Product (PROBIOTIC PO), Take 1 tablet by mouth daily as needed., Disp: , Rfl:  .  solifenacin (VESICARE) 5 MG tablet, TAKE 1 TABLET(5 MG) BY MOUTH DAILY, Disp: 30 tablet, Rfl: 1  EXAM: BP Readings from Last 3 Encounters:  02/01/19 133/85  01/12/19 118/78  01/04/19 128/84    VITALS per patient if applicable:  GENERAL: alert,  oriented, appears well and in no acute distress HEENT: atraumatic, conjunttiva clear, no obvious abnormalities on inspection of external nose and ears  NECK: normal movements of the head and neck LUNGS: on inspection no signs of respiratory distress, breathing rate appears normal, no obvious gross SOB, gasping or wheezing CV: no obvious cyanosis PSYCH/NEURO: pleasant and cooperative, no obvious depression or anxiety, speech and thought processing grossly intact   ASSESSMENT AND PLAN:  Discussed the following assessment and plan:    ICD-10-CM   1. Recurrent UTI  N39.0    resolved  after  macrobid rx  ecoli pansensitive   2. Other fatigue  R53.83    resolved  see text   no alarm sx or need for further eval at this time   Record review resolved UTI garden-variety E. coli.  Explained implications and that this is a routine bacteria that can cause cystitis.  Since she is improved her symptoms resolved we will just observe.  It is possible her fatigue could be  related or other.   No alarm symptoms with the episode of right lateral tingling of the thigh this could be femoral cutaneous nerve area but have her observe if associated symptoms persistent progressive or weakness. Counseled.  Regarding Covid exposure antibody testing may not be helpful although she can requested and next time she is due for any blood work.  Expectant management and discussion of plan and treatment with opportunity to ask questions and all were answered. The patient agreed with the plan and demonstrated an understanding of the instructions.   Advised to call back or seek an in-person evaluation if worsening  or having  further concerns . Return if symptoms return  or as needed. Shanon Ace, MD

## 2019-02-09 ENCOUNTER — Telehealth (INDEPENDENT_AMBULATORY_CARE_PROVIDER_SITE_OTHER): Payer: 59 | Admitting: Internal Medicine

## 2019-02-09 ENCOUNTER — Other Ambulatory Visit: Payer: Self-pay

## 2019-02-09 ENCOUNTER — Encounter: Payer: Self-pay | Admitting: Internal Medicine

## 2019-02-09 DIAGNOSIS — R5383 Other fatigue: Secondary | ICD-10-CM | POA: Diagnosis not present

## 2019-02-09 DIAGNOSIS — N390 Urinary tract infection, site not specified: Secondary | ICD-10-CM | POA: Diagnosis not present

## 2019-02-23 ENCOUNTER — Other Ambulatory Visit: Payer: Self-pay | Admitting: Internal Medicine

## 2019-02-23 DIAGNOSIS — I10 Essential (primary) hypertension: Secondary | ICD-10-CM

## 2019-02-26 ENCOUNTER — Ambulatory Visit
Admission: RE | Admit: 2019-02-26 | Discharge: 2019-02-26 | Disposition: A | Discharge status: Home / Self Care | Payer: 59 | Source: Ambulatory Visit | Attending: Family Medicine | Admitting: Family Medicine

## 2019-02-26 DIAGNOSIS — G8929 Other chronic pain: Secondary | ICD-10-CM

## 2019-02-26 DIAGNOSIS — M25562 Pain in left knee: Secondary | ICD-10-CM

## 2019-03-02 ENCOUNTER — Other Ambulatory Visit: Payer: Self-pay | Admitting: Internal Medicine

## 2019-03-06 ENCOUNTER — Encounter: Payer: Self-pay | Admitting: Family Medicine

## 2019-03-10 ENCOUNTER — Other Ambulatory Visit: Payer: Self-pay

## 2019-03-10 ENCOUNTER — Ambulatory Visit (INDEPENDENT_AMBULATORY_CARE_PROVIDER_SITE_OTHER): Payer: 59

## 2019-03-10 ENCOUNTER — Ambulatory Visit: Payer: 59 | Admitting: Family Medicine

## 2019-03-10 ENCOUNTER — Encounter: Payer: Self-pay | Admitting: Family Medicine

## 2019-03-10 VITALS — BP 124/82 | HR 87 | Ht 60.5 in | Wt 138.0 lb

## 2019-03-10 DIAGNOSIS — M1712 Unilateral primary osteoarthritis, left knee: Secondary | ICD-10-CM | POA: Diagnosis not present

## 2019-03-10 DIAGNOSIS — M25562 Pain in left knee: Secondary | ICD-10-CM | POA: Diagnosis not present

## 2019-03-10 DIAGNOSIS — S83412A Sprain of medial collateral ligament of left knee, initial encounter: Secondary | ICD-10-CM

## 2019-03-10 DIAGNOSIS — G8929 Other chronic pain: Secondary | ICD-10-CM

## 2019-03-10 NOTE — Assessment & Plan Note (Signed)
Patient continues to have pain in this area.  Patient continues to have more of an effusion in the knee that I think is causing more the problem secondary to the severe patellofemoral arthritis.  We discussed with patient the different treatment options including different types of injections and aspiration was done today.  Discussed bracing, home exercises.  Follow-up again in 4 to 6 weeks

## 2019-03-10 NOTE — Progress Notes (Signed)
[place] Phone: [phone] Subjective:   Christina [name], am serving as a scribe for Dr. Hulan Saas. This visit occurred during the SARS-CoV-2 public health emergency.  Safety protocols were in place, including screening questions prior to the visit, additional usage of staff PPE, and extensive cleaning of exam room while observing appropriate contact time as indicated for disinfecting solutions.   I'm seeing this patient by the request  of:  Panosh, Standley Brooking, MD  CC: Left knee pain  [id number]  Christina [name] is a 59 y.o. female coming in with complaint of left knee pain. Is here today for aspiration of her knee joint.  Patient was found on MRI to have severe patellofemoral arthritis.  Patient did have sprain of the medial collateral ligament but [name] gapping noted.  Small effusion noted.      Past Medical History:  Diagnosis Date  . Arthritis   . Chicken pox    Past Surgical History:  Procedure Laterality Date  . CHOLECYSTECTOMY     Social History   Socioeconomic History  . Marital status: Married    Spouse name: Not on file  . Number of children: Not on file  . Years of education: Not on file  . Highest education level: Not on file  Occupational History  . Not on file  Tobacco Use  . Smoking status: Former Research scientist (life sciences)  . Smokeless tobacco: Never Used  Substance and Sexual Activity  . Alcohol use: Yes  . Drug use: [name]  . Sexual activity: Yes    Partners: Male  Other Topics Concern  . Not on file  Social History Narrative  . Not on file   Social Determinants of Health   Financial Resource Strain:   . Difficulty of Paying Living Expenses: Not on file  Food Insecurity:   . Worried About Charity fundraiser in the Last Year: Not on file  . Ran Out of Food in the Last Year: Not on file  Transportation Needs:   . Lack of Transportation (Medical): Not on file  . Lack of Transportation  (Non-Medical): Not on file  Physical Activity:   . Days of Exercise per Week: Not on file  . Minutes of Exercise per Session: Not on file  Stress:   . Feeling of Stress : Not on file  Social Connections:   . Frequency of Communication with Friends and Family: Not on file  . Frequency of Social Gatherings with Friends and Family: Not on file  . Attends Religious Services: Not on file  . Active Member of Clubs or Organizations: Not on file  . Attends Archivist Meetings: Not on file  . Marital Status: Not on file   [name] Known Allergies Family History  Problem Relation Age of Onset  . Cancer Mother   . Hyperlipidemia Mother   . Hypertension Mother   . Stroke Mother   . Cancer Father   . Hypertension Maternal Grandmother   . Hypertension Paternal Grandmother   . Stroke Paternal Grandmother   . Allergic rhinitis Neg Hx   . Asthma Neg Hx      Current Outpatient Medications (Cardiovascular):  .  losartan (COZAAR) 50 MG tablet, TAKE 1 TABLET BY MOUTH EVERY DAY  Current Outpatient Medications (Respiratory):  .  azelastine (ASTELIN) 0.1 % nasal spray, Place 2 sprays into both nostrils 2 (two) times daily as needed for rhinitis. Use in each nostril as directed .  cetirizine (ZYRTEC) 10 MG tablet, TAKE 1 TABLET(10 MG) BY MOUTH DAILY .  ipratropium (ATROVENT) 0.06 % nasal spray, Place 2 sprays into both nostrils 3 (three) times daily. Marland Kitchen  levocetirizine (XYZAL) 5 MG tablet, Take 1 tablet (5 mg total) by mouth every evening.    Current Outpatient Medications (Other):  Marland Kitchen  Cholecalciferol (VITAMIN D PO), Take 1 tablet by mouth daily. .  nitrofurantoin, macrocrystal-monohydrate, (MACROBID) 100 MG capsule, Take 1 capsule (100 mg total) by mouth 2 (two) times daily. .  Probiotic Product (PROBIOTIC PO), Take 1 tablet by mouth daily as needed. .  solifenacin (VESICARE) 5 MG tablet, TAKE 1 TABLET(5 MG) BY MOUTH DAILY    Past medical history, social, surgical and family history all  reviewed in electronic medical record.  [name] pertanent information unless stated regarding to the chief complaint.   Review of Systems:  [name] headache, visual changes, nausea, vomiting, diarrhea, constipation, dizziness, abdominal pain, skin rash, fevers, chills, night sweats, weight loss, swollen lymph nodes, body aches, joint swelling, chest pain, shortness of breath, mood changes. POSITIVE muscle aches  Objective  Blood pressure 124/82, pulse 87, height 5' 0.5" (1.537 m), weight 138 lb (62.6 kg), SpO2 96 %.   General: [name] apparent distress alert and oriented x3 mood and affect normal, dressed appropriately.  HEENT: Pupils equal, extraocular movements intact  Respiratory: Patient's speak in full sentences and does not appear short of breath  Cardiovascular: [name] lower extremity edema, non tender, [name] erythema  Skin: Warm dry intact with [name] signs of infection or rash on extremities or on axial skeleton.  Abdomen: Soft nontender  Neuro: Cranial nerves II through XII are intact, neurovascularly intact in all extremities with 2+ DTRs and 2+ pulses.  Lymph: [name] lymphadenopathy of posterior or anterior cervical chain or axillae bilaterally.  Gait normal with good balance and coordination.  MSK:  Non tender with full range of motion and good stability and symmetric strength and tone of shoulders, elbows, wrist, hip, and ankles bilaterally.  Left knee exam does have some positive patellar grind.  Trace effusion noted.  Patient is tender to palpation more in the medial joint line.  [name] gapping of the MCL but pain with dynamic testing.  Procedure: Real-time Ultrasound Guided Injection of left knee Device: GE Logiq Q7 Ultrasound guided injection is preferred based studies that show increased duration, increased effect, greater accuracy, decreased procedural pain, increased response rate, and decreased cost with ultrasound guided versus blind injection.  Verbal informed consent obtained.  Time-out conducted.    Noted [name] overlying erythema, induration, or other signs of local infection.  Skin prepped in a sterile fashion.  Local anesthesia: Topical Ethyl chloride.  With sterile technique and under real time ultrasound guidance: With a 22-gauge 2 inch needle patient was injected with 4 cc of 0.5% Marcaine aspirated 25 cc of straw-colored fluid then injected  1 cc of Kenalog 40 mg/dL. This was from a superior lateral approach.  Completed without difficulty  Pain immediately resolved suggesting accurate placement of the medication.  Advised to call if fevers/chills, erythema, induration, drainage, or persistent bleeding.  Images permanently stored and available for review in the ultrasound unit.  Impression: Technically successful ultrasound guided injection.   Impression and Recommendations:     This case required medical decision making of moderate complexity. The above documentation has been reviewed and is accurate and complete Lyndal Pulley, DO       Note: This dictation was prepared with Dragon dictation along with smaller  Company secretary. Any transcriptional errors that result from this process are unintentional.

## 2019-03-10 NOTE — Patient Instructions (Signed)
Drained knee today See me again in 6 weeks Ice in 6 hours

## 2019-03-10 NOTE — Assessment & Plan Note (Signed)
Patellofemoral arthritis.  Discussed icing regimen and home exercise, which activities to do which wants to avoid.  Patient to increase activity as tolerated.  Follow-up again in 4 to 8 weeks

## 2019-03-29 ENCOUNTER — Other Ambulatory Visit: Payer: Self-pay

## 2019-03-29 MED ORDER — CETIRIZINE HCL 10 MG PO TABS: ORAL_TABLET | ORAL | 5 refills | Status: DC

## 2019-03-31 ENCOUNTER — Encounter: Payer: Self-pay | Admitting: Family Medicine

## 2019-04-02 ENCOUNTER — Other Ambulatory Visit: Payer: Self-pay

## 2019-04-02 ENCOUNTER — Encounter: Payer: Self-pay | Admitting: Allergy & Immunology

## 2019-04-02 ENCOUNTER — Ambulatory Visit: Payer: 59 | Admitting: Allergy & Immunology

## 2019-04-02 VITALS — BP 162/94 | HR 71 | Temp 98.0°F | Resp 18

## 2019-04-02 DIAGNOSIS — J3089 Other allergic rhinitis: Secondary | ICD-10-CM | POA: Diagnosis not present

## 2019-04-02 DIAGNOSIS — J302 Other seasonal allergic rhinitis: Secondary | ICD-10-CM | POA: Diagnosis not present

## 2019-04-02 DIAGNOSIS — L508 Other urticaria: Secondary | ICD-10-CM | POA: Diagnosis not present

## 2019-04-02 MED ORDER — LEVOCETIRIZINE DIHYDROCHLORIDE 5 MG PO TABS: 5.0000 mg | ORAL_TABLET | Freq: Every evening | ORAL | 5 refills | Status: DC

## 2019-04-02 NOTE — Progress Notes (Signed)
FOLLOW UP  Date of Service/Encounter:  04/02/19   Assessment:   Seasonal and perennial allergic rhinitis(weeds, grasses, indoor molds, dust mites, cat, dog and cockroach) - with worsening symptoms in the fall   Chronic urticaria  Recent episode of sinusitis - controlled without antibiotics, likely triggered from the change in altitude  Plan/Recommendations:   1. Seasonal and perennial allergic rhinitis - Continue with Zyrtec (cetirizine) in the morning and Xyzal (levocetirizine) at night. - We will send in a prescription for this. - Try using Sudafed on travel days to prevent these in the future.  - Consider changing antihistamines every few months (Allegra, Zyrtec, Xyzal). - Continue with Astelin two sprays per nostril up to twice daily daily. - Add on nasal ipratropium one spray per nostril up to three times daily (this can be overdrying).  - Contact us with any questions or concerns.   2. Chronic urticaria -Continue with the antihistamines as above.  3. Return in about 1 year (around 04/01/2020). This can be an in-person, a virtual Webex or a telephone follow up visit.   Subjective:   Christina [name] is a 59 y.o. female presenting today for follow up of  Chief Complaint  Patient presents with  . Sinusitis    wondering if there are other things that can cause sinus infection    Christina [name] has a history of the following: Patient Active Problem List   Diagnosis Date Noted  . Patellofemoral arthritis of left knee 03/10/2019  . Acute medial meniscus tear of left knee 11/19/2018  . Sprain of medial collateral ligament of left knee 09/01/2018  . Neck strain, initial encounter 08/28/2018  . Degenerative disc disease, cervical 08/28/2018  . Lumbar radiculopathy, right 03/05/2018  . Right lateral epicondylitis 10/30/2017  . SI (sacroiliac) joint dysfunction 10/30/2017  . Nonallopathic lesion of sacral region 10/30/2017  . Nonallopathic lesion of lumbar region 10/30/2017  .  Nonallopathic lesion of thoracic region 10/30/2017  . History of recurrent UTIs 04/11/2017  . 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 Allergic rhinitis 10/29/2016    History obtained from: chart review and patient.  Christina [name] is a 59 y.o. female presenting for a sick visit. She was last seen in November 2020. At that time, we continued with cetirizine as well as alternating antihistamines. We did add on nasal ipratropium one spray per nostril TID as needed.    Since the last visit, she has mostly done well. However, she went skiing for three days and felt that she was getting a sinus infection. She was intensely skiing with her family and did develop some knee pain (she lacks cartilage in there apparently). She also started having sinus pain and pressure. She did not have any fever or loss of smell. She came back and was sleeping for days on end. She was having increased appetite.  She has been treating this with use of her nasal sprays.  She has been very aggressive with it, using it 3 times a day.  She actually thinks she is feeling better and this morning felt almost normal.  She does not think she needs antibiotics at all.  However, she want to come in and discuss how to deal with future episodes like this.  Her last testing was done in 2019 and was positive to weeds, grasses, indoor molds, dust mites, cat, dog and cockroach.  She was never interested in allergen immunotherapy, so we never did additional testing.  She remains uninterested in allergen immunotherapy.  She does have  a lot to say about the COVID-19 vaccination.  Specifically, she is wondering how to go about getting one.  She tells me that she got on a wait list at [facility] today, apparently at the end of the day, they will call the wait list to see if they can make it in for the vaccine so they do not have to waste anything at all.   Otherwise, there have been no changes to her past medical history, surgical history, family  history, or social history.    Review of Systems  Constitutional: Negative.  Negative for chills, fever, malaise/fatigue and weight loss.  HENT: Negative.  Negative for congestion, ear discharge and ear pain.   Eyes: Negative for pain, discharge and redness.  Respiratory: Negative for cough, sputum production, shortness of breath and wheezing.   Cardiovascular: Negative.  Negative for chest pain and palpitations.  Gastrointestinal: Negative for abdominal pain, constipation, diarrhea, heartburn, nausea and vomiting.  Skin: Negative.  Negative for itching and rash.  Neurological: Negative for dizziness and headaches.  Endo/Heme/Allergies: Negative for environmental allergies. Does not bruise/bleed easily.       Objective:   Blood pressure (!) 162/94, pulse 71, temperature 98 F (36.7 C), temperature source Temporal, resp. rate 18, SpO2 97 %. There is no height or weight on file to calculate BMI.   Physical Exam:  Physical Exam  Constitutional: She appears well-developed.  Pleasant female. Very talkative.   HENT:  Head: Normocephalic and atraumatic.  Right Ear: Tympanic membrane, external ear and ear canal normal.  Left Ear: Tympanic membrane, external ear and ear canal normal.  Nose: Mucosal edema and rhinorrhea present. No nasal deformity or septal deviation. No epistaxis. Right sinus exhibits no maxillary sinus tenderness and no frontal sinus tenderness. Left sinus exhibits no maxillary sinus tenderness and no frontal sinus tenderness.  Mouth/Throat: Uvula is midline and oropharynx is clear and moist. Mucous membranes are not pale and not dry.  There is no purulent discharge noted.   Eyes: Pupils are equal, round, and reactive to light. Conjunctivae and EOM are normal. Right eye exhibits no chemosis and no discharge. Left eye exhibits no chemosis and no discharge. Right conjunctiva is not injected. Left conjunctiva is not injected.  Cardiovascular: Normal rate, regular rhythm  and normal heart sounds.  Respiratory: Effort normal and breath sounds normal. No accessory muscle usage. No tachypnea. No respiratory distress. She has no wheezes. She has no rhonchi. She has no rales. She exhibits no tenderness.  Moving air well in all lung fields. No increased work of breathing noted.   Lymphadenopathy:    She has no cervical adenopathy.  Neurological: She is alert.  Skin: No abrasion, no petechiae and no rash noted. Rash is not papular, not vesicular and not urticarial. No erythema. No pallor.  No eczematous or urticarial lesions noted.   Psychiatric: She has a normal mood and affect.     Diagnostic studies: none    Salvatore Marvel, MD  [facility]

## 2019-04-02 NOTE — Patient Instructions (Addendum)
1. Seasonal and perennial allergic rhinitis - Continue with Zyrtec (cetirizine) in the morning and Xyzal (levocetirizine) at night. - We will send in a prescription for this. - Try using Sudafed on travel days to prevent these in the future.  - Consider changing antihistamines every few months (Allegra, Zyrtec, Xyzal). - Continue with Astelin two sprays per nostril up to twice daily daily. - Add on nasal ipratropium one spray per nostril up to three times daily (this can be overdrying).  - Contact us with any questions or concerns.   2. Chronic urticaria -Continue with the antihistamines as above.  3. Return in about 1 year (around 04/01/2020). This can be an in-person, a virtual Webex or a telephone follow up visit.   Please inform us of any Emergency Department visits, hospitalizations, or changes in symptoms. Call us before going to the ED for breathing or allergy symptoms since we might be able to fit you in for a sick visit. Feel free to contact us anytime with any questions, problems, or concerns.  It was a pleasure to see you again today!  Websites that have reliable patient information: 1. American Academy of Asthma, Allergy, and Immunology: www.aaaai.org 2. Food Allergy Research and Education (FARE): foodallergy.org 3. Mothers of Asthmatics: http://www.asthmacommunitynetwork.org 4. American College of Allergy, Asthma, and Immunology: www.acaai.org   COVID-19 Vaccine Information can be found at: ShippingScam.co.uk For questions related to vaccine distribution or appointments, please email vaccine@[email or web address] .com or call (540)[phone].     "Like" [place] on Facebook and Instagram for our latest updates!        Make sure you are registered to vote! If you have moved or changed any of your contact information, you will need to get this updated before voting!  In some cases, you MAY be able to register to vote online:  [email or web address]

## 2019-04-05 NOTE — Telephone Encounter (Signed)
For now take 100 mg losartan per day ( can take at same time) Schedule in person visit and  Check BP readings  2 x per day for 7 - 10 days     And bring readings  And  your monitor to the visit. If needed please refill losartan at bid dosing  If she is going to run out  Before the visit

## 2019-04-12 ENCOUNTER — Telehealth: Payer: Self-pay | Admitting: Radiology

## 2019-04-12 ENCOUNTER — Ambulatory Visit (INDEPENDENT_AMBULATORY_CARE_PROVIDER_SITE_OTHER): Payer: 59

## 2019-04-12 ENCOUNTER — Encounter: Payer: Self-pay | Admitting: Orthopedic Surgery

## 2019-04-12 ENCOUNTER — Ambulatory Visit: Payer: 59 | Admitting: Orthopedic Surgery

## 2019-04-12 ENCOUNTER — Other Ambulatory Visit: Payer: Self-pay

## 2019-04-12 VITALS — Ht 61.0 in | Wt 135.0 lb

## 2019-04-12 DIAGNOSIS — G8929 Other chronic pain: Secondary | ICD-10-CM

## 2019-04-12 DIAGNOSIS — M1711 Unilateral primary osteoarthritis, right knee: Secondary | ICD-10-CM | POA: Diagnosis not present

## 2019-04-12 DIAGNOSIS — M25561 Pain in right knee: Secondary | ICD-10-CM | POA: Diagnosis not present

## 2019-04-12 MED ORDER — MELOXICAM 15 MG PO TABS: 15.0000 mg | ORAL_TABLET | Freq: Every day | ORAL | 0 refills | Status: DC

## 2019-04-12 NOTE — Progress Notes (Signed)
mobi

## 2019-04-12 NOTE — Telephone Encounter (Signed)
Please submit for authorization for bilateral knee gel injections.  Thanks.

## 2019-04-13 NOTE — Telephone Encounter (Signed)
Submitted for VOB for Synvisc one-Bilateral knee  

## 2019-04-16 ENCOUNTER — Encounter: Payer: Self-pay | Admitting: Orthopedic Surgery

## 2019-04-16 DIAGNOSIS — M1711 Unilateral primary osteoarthritis, right knee: Secondary | ICD-10-CM | POA: Diagnosis not present

## 2019-04-16 MED ORDER — LIDOCAINE HCL 1 % IJ SOLN
5.0000 mL | INTRAMUSCULAR | Status: AC | PRN
  Administered 2019-04-16: 19:00:00 5 mL

## 2019-04-16 MED ORDER — BUPIVACAINE HCL 0.25 % IJ SOLN
4.0000 mL | INTRAMUSCULAR | Status: AC | PRN
  Administered 2019-04-16: 4 mL via INTRA_ARTICULAR

## 2019-04-16 MED ORDER — METHYLPREDNISOLONE ACETATE 40 MG/ML IJ SUSP
40.0000 mg | INTRAMUSCULAR | Status: AC | PRN
  Administered 2019-04-16: 40 mg via INTRA_ARTICULAR

## 2019-04-16 NOTE — Progress Notes (Signed)
Office Visit Note   Patient: Christina [name]           Date of Birth: Aug 22, 1960           MRN: [id number] Visit Date: 04/12/2019 Requested by: Burnis Medin, MD [place],  [place] [address] PCP: Burnis Medin, MD  Subjective: Chief Complaint  Patient presents with  . Left Knee - Pain  . Right Knee - Pain    HPI: Christina [name] is a 59 y.o. female who presents to the office complaining of right knee pain.  Patient notes a long history of right knee pain.  She denies any acute injury leading to the onset of pain.  She does note a ski trip in the past where her right knee bothers her to the point where she could not ski on the third day of that trip.  She is not taking anything for pain.  She localizes the majority of her pain to the medial and lateral aspects of the right knee anteriorly.  She also has a history of left knee pain but this was treated with an injection prior to her ski trip and is doing well right now.  She has had the right knee aspirated 2 years ago that provided some relief but she has not had any recent injections into the right knee.  Pain does not wake her up at night.  She has no history of knee surgery.  She denies any groin pain, low back pain, numbness/tingling down the leg, radicular leg pain.  She has a history of playing basketball competitively in high school and college.  .                ROS:  All systems reviewed are negative as they relate to the chief complaint within the history of present illness.  Patient denies fevers or chills.  Assessment & Plan: Visit Diagnoses:  1. Unilateral primary osteoarthritis, right knee   2. Chronic pain of right knee     Plan: Patient is a 59 year old female who presents complaining of primarily right knee pain today.  She has a history of bilateral knee pain but her left knee was injected prior to a ski trip and that is doing well now.  As for the right knee she has had no recent injections.  Radiographs of the  right knee reveal right knee osteoarthritis.  Discussed options available to patient.  Recommended cortisone injection of the right knee with 30-day course of meloxicam once a day.  We will also preapproved for gel injections.  Patient tolerated the procedure well today.  She will follow-up as needed when her pain returns and we will consider gel injection at that time.  Patient agree with plan.  This patient is diagnosed with osteoarthritis of the knee(s).    Radiographs show evidence of joint space narrowing, osteophytes, subchondral sclerosis and/or subchondral cysts.  This patient has knee pain which interferes with functional and activities of daily living.    This patient has experienced inadequate response, adverse effects and/or intolerance with conservative treatments such as acetaminophen, NSAIDS, topical creams, physical therapy or regular exercise, knee bracing and/or weight loss.   This patient has experienced inadequate response or has a contraindication to intra articular steroid injections for at least 3 months.   This patient is not scheduled to have a total knee replacement within 6 months of starting treatment with viscosupplementation.   Follow-Up Instructions: No follow-ups on file.   Orders:  Orders Placed This Encounter  Procedures  . XR KNEE 3 VIEW RIGHT   Meds ordered this encounter  Medications  . meloxicam (MOBIC) 15 MG tablet    Sig: Take 1 tablet (15 mg total) by mouth daily.    Dispense:  30 tablet    Refill:  0      Procedures: Large Joint Inj: R knee on 04/16/2019 6:53 PM Indications: diagnostic evaluation, joint swelling and pain Details: 18 G 1.5 in needle, superolateral approach  Arthrogram: No  Medications: 5 mL lidocaine 1 %; 40 mg methylPREDNISolone acetate 40 MG/ML; 4 mL bupivacaine 0.25 % Outcome: tolerated well, no immediate complications Procedure, treatment alternatives, risks and benefits explained, specific risks discussed. Consent was  given by the patient. Immediately prior to procedure a time out was called to verify the correct patient, procedure, equipment, support staff and site/side marked as required. Patient was prepped and draped in the usual sterile fashion.       Clinical Data: No additional findings.  Objective: Vital Signs: Ht 5\' 1"  (1.549 m)   Wt 135 lb (61.2 kg)   BMI 25.51 kg/m   Physical Exam:  Constitutional: Patient appears well-developed HEENT:  Head: Normocephalic Eyes:EOM are normal Neck: Normal range of motion Cardiovascular: Normal rate Pulmonary/chest: Effort normal Neurologic: Patient is alert Skin: Skin is warm Psychiatric: Patient has normal mood and affect  Ortho Exam:  Right knee Exam Tenderness to palpation over the medial and lateral joint lines No effusion Extensor mechanism intact No TTP over the quad tendon, patellar tendon, pes anserinus, patella, tibial tubercle, LCL/MCL insertions Stable to varus/valgus stresses.  Stable to anterior/posterior drawer Extension to 0 degrees Flexion > 90 degrees  Specialty Comments:  No specialty comments available.  Imaging: No results found.   PMFS History: Patient Active Problem List   Diagnosis Date Noted  . Patellofemoral arthritis of left knee 03/10/2019  . Acute medial meniscus tear of left knee 11/19/2018  . Sprain of medial collateral ligament of left knee 09/01/2018  . Neck strain, initial encounter 08/28/2018  . Degenerative disc disease, cervical 08/28/2018  . Lumbar radiculopathy, right 03/05/2018  . Right lateral epicondylitis 10/30/2017  . SI (sacroiliac) joint dysfunction 10/30/2017  . Nonallopathic lesion of sacral region 10/30/2017  . Nonallopathic lesion of lumbar region 10/30/2017  . Nonallopathic lesion of thoracic region 10/30/2017  . History of recurrent UTIs 04/11/2017  . 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 Allergic rhinitis 10/29/2016   Past Medical History:  Diagnosis Date  .  Arthritis   . Chicken pox   . Recurrent upper respiratory infection (URI)     Family History  Problem Relation Age of Onset  . Cancer Mother   . Hyperlipidemia Mother   . Hypertension Mother   . Stroke Mother   . Cancer Father   . Hypertension Maternal Grandmother   . Hypertension Paternal Grandmother   . Stroke Paternal Grandmother   . Allergic rhinitis Neg Hx   . Asthma Neg Hx     Past Surgical History:  Procedure Laterality Date  . CHOLECYSTECTOMY     Social History   Occupational History  . Not on file  Tobacco Use  . Smoking status: Former Research scientist (life sciences)  . Smokeless tobacco: Never Used  Substance and Sexual Activity  . Alcohol use: Yes  . Drug use: No  . Sexual activity: Yes    Partners: Male

## 2019-04-23 ENCOUNTER — Telehealth: Payer: Self-pay

## 2019-04-23 NOTE — Telephone Encounter (Signed)
Approved for Synvisc one-Bilateral knee Dr. Latanya Presser and Bill $50 copay then covered @ 100% No OOP Prior auth required Auth recevied  Dates 04/23/19-04/21/20

## 2019-04-26 NOTE — Telephone Encounter (Signed)
Auth # [id number]

## 2019-04-26 NOTE — Telephone Encounter (Signed)
FYI   Called pt to schedule, she wants to hold off on scheduling for now. Wants to give the cortisone shot more time.

## 2019-04-29 ENCOUNTER — Other Ambulatory Visit: Payer: Self-pay | Admitting: Internal Medicine

## 2019-05-15 ENCOUNTER — Other Ambulatory Visit: Payer: Self-pay | Admitting: Orthopedic Surgery

## 2019-05-17 NOTE — Telephone Encounter (Signed)
Ok to rf? 

## 2019-05-18 ENCOUNTER — Ambulatory Visit (INDEPENDENT_AMBULATORY_CARE_PROVIDER_SITE_OTHER): Payer: 59 | Admitting: Internal Medicine

## 2019-05-18 ENCOUNTER — Encounter: Payer: Self-pay | Admitting: Internal Medicine

## 2019-05-18 VITALS — BP 120/70 | HR 59 | Temp 97.3°F | Ht 62.0 in | Wt 138.0 lb

## 2019-05-18 DIAGNOSIS — Z0000 Encounter for general adult medical examination without abnormal findings: Secondary | ICD-10-CM

## 2019-05-18 DIAGNOSIS — Z1159 Encounter for screening for other viral diseases: Secondary | ICD-10-CM

## 2019-05-18 DIAGNOSIS — I10 Essential (primary) hypertension: Secondary | ICD-10-CM | POA: Diagnosis not present

## 2019-05-18 DIAGNOSIS — E785 Hyperlipidemia, unspecified: Secondary | ICD-10-CM | POA: Diagnosis not present

## 2019-05-18 LAB — HEPATIC FUNCTION PANEL
ALT: 20 U/L (ref 0–35)
AST: 20 U/L (ref 0–37)
Albumin: 4.4 g/dL (ref 3.5–5.2)
Alkaline Phosphatase: 55 U/L (ref 39–117)
Bilirubin, Direct: 0.2 mg/dL (ref 0.0–0.3)
Total Bilirubin: 0.9 mg/dL (ref 0.2–1.2)
Total Protein: 6.9 g/dL (ref 6.0–8.3)

## 2019-05-18 LAB — LIPID PANEL
Cholesterol: 228 mg/dL — ABNORMAL HIGH (ref 0–200)
HDL: 74.7 mg/dL (ref >39.00)
LDL Cholesterol: 140 mg/dL — ABNORMAL HIGH (ref 0–99)
NonHDL: 152.83
Total CHOL/HDL Ratio: 3
Triglycerides: 64 mg/dL (ref 0.0–149.0)
VLDL: 12.8 mg/dL (ref 0.0–40.0)

## 2019-05-18 LAB — BASIC METABOLIC PANEL
BUN: 14 mg/dL (ref 6–23)
CO2: 32 mEq/L (ref 19–32)
Calcium: 9.6 mg/dL (ref 8.4–10.5)
Chloride: 103 mEq/L (ref 96–112)
Creatinine, Ser: 0.78 mg/dL (ref 0.40–1.20)
GFR: 75.19 mL/min (ref >60.00)
Glucose, Bld: 103 mg/dL — ABNORMAL HIGH (ref 70–99)
Potassium: 4.2 mEq/L (ref 3.5–5.1)
Sodium: 139 mEq/L (ref 135–145)

## 2019-05-18 LAB — HEMOGLOBIN A1C: Hgb A1c MFr Bld: 5.7 % (ref 4.6–6.5)

## 2019-05-18 NOTE — Progress Notes (Signed)
Chief Complaint  Patient presents with  . Annual Exam    HPI: Patient  Christina [name]  59 y.o. comes in today for Preventive Health Care visit  bp good had covid vaccine series  Health Maintenance  Topic Date Due  . HIV Screening  Never done  . PAP SMEAR-Modifier  04/11/2020  . MAMMOGRAM  10/11/2020  . TETANUS/TDAP  03/25/2022  . COLONOSCOPY  06/10/2022  . INFLUENZA VACCINE  Completed  . Hepatitis C Screening  Completed   Health Maintenance Review LIFESTYLE:  Exercise:  Walking and yoga and tennis  Tobacco/ETS: no Alcohol:   1 per night ave Sugar beverages: no  Sleep: doing well   7-8  Drug use: no HH of  2 1 dog  Work:part time  7- 30 per week.    ROS: mole on back not sure how long there, knees getting injections  Help,dr Bean, allergy prevention helping her  Sinus infection preventions  BP has been controlled lost weight GEN/ HEENT: No fever, significant weight changes sweats headaches vision problems hearing changes, CV/ PULM; No chest pain shortness of breath cough, syncope,edema  change in exercise tolerance. GI /GU: No adominal pain, vomiting, change in bowel habits. No blood in the stool. No significant GU symptoms. SKIN/HEME: ,no acute skin rashes suspicious lesions or bleeding. No lymphadenopathy, nodules, masses.  NEURO/ PSYCH:  No neurologic signs such as weakness numbness. No depression anxiety. IMM/ Allergy: No unusual infections.  Allergy .   REST of 12 system review negative except as per HPI   Past Medical History:  Diagnosis Date  . Arthritis   . Chicken pox   . Recurrent upper respiratory infection (URI)     Past Surgical History:  Procedure Laterality Date  . CHOLECYSTECTOMY      Family History  Problem Relation Age of Onset  . Cancer Mother   . Hyperlipidemia Mother   . Hypertension Mother   . Stroke Mother   . Cancer Father   . Hypertension Maternal Grandmother   . Hypertension Paternal Grandmother   . Stroke Paternal Grandmother   .  Allergic rhinitis Neg Hx   . Asthma Neg Hx     Social History   Socioeconomic History  . Marital status: Married    Spouse name: Not on file  . Number of children: Not on file  . Years of education: Not on file  . Highest education level: Not on file  Occupational History  . Not on file  Tobacco Use  . Smoking status: Former Research scientist (life sciences)  . Smokeless tobacco: Never Used  Substance and Sexual Activity  . Alcohol use: Yes  . Drug use: No  . Sexual activity: Yes    Partners: Male  Other Topics Concern  . Not on file  Social History Narrative  . Not on file   Social Determinants of Health   Financial Resource Strain:   . Difficulty of Paying Living Expenses:   Food Insecurity:   . Worried About Charity fundraiser in the Last Year:   . Arboriculturist in the Last Year:   Transportation Needs:   . Film/video editor (Medical):   Marland Kitchen Lack of Transportation (Non-Medical):   Physical Activity:   . Days of Exercise per Week:   . Minutes of Exercise per Session:   Stress:   . Feeling of Stress :   Social Connections:   . Frequency of Communication with Friends and Family:   . Frequency of Social Gatherings  with Friends and Family:   . Attends Religious Services:   . Active Member of Clubs or Organizations:   . Attends Archivist Meetings:   Marland Kitchen Marital Status:        EXAM:  BP 120/70   Pulse (!) 59   Temp (!) 97.3 F (36.3 C) (Other (Comment))   Ht 5\' 2"  (1.575 m)   Wt 138 lb (62.6 kg)   SpO2 99%   BMI 25.24 kg/m   Body mass index is 25.24 kg/m. Wt Readings from Last 3 Encounters:  05/18/19 138 lb (62.6 kg)  04/12/19 135 lb (61.2 kg)  03/10/19 138 lb (62.6 kg)    Physical Exam: Vital signs reviewed [id number] is a well-developed well-nourished alert cooperative    who appearsr stated age in no acute distress.  HEENT: normocephalic atraumatic , Eyes: PERRL EOM's full, conjunctiva clear, Nares: paten,t no deformity discharge or tenderness., Ears: no  deformity EAC's clear TMs with normal landmarks. Mouth: clear OP,masked  NECK: supple without masses, thyromegaly or bruits. CHEST/PULM:  Clear to auscultation and percussion breath sounds equal no wheeze , rales or rhonchi. No chest wall deformities or tenderness. Breast: normal by inspection . No dimpling, discharge, masses, tenderness or discharge . CV: PMI is nondisplaced, S1 S2 no gallops, murmurs, rubs. Peripheral pulses are full without delay.No JVD .  ABDOMEN: Bowel sounds normal nontender  No guard or rebound, no hepato splenomegal no CVA tenderness.  No hernia. Extremtities:  No clubbing cyanosis or edema, no acute joint swelling or redness no focal atrophy NEURO:  Oriented x3, cranial nerves 3-12 appear to be intact, no obvious focal weakness,gait within normal limits no abnormal reflexes or asymmetrical SKIN: No acute rashes normal turgor, color, no bruising or petechiae.  Back a 5 mm smooth moles brown discrete raised  Right infrascapular area  PSYCH: Oriented, good eye contact, no obvious depression anxiety, cognition and judgment appear normal. LN: no cervical axillary inguinal adenopathy  Lab Results  Component Value Date   WBC 6.7 12/09/2018   HGB 13.2 12/09/2018   HCT 39.7 12/09/2018   PLT 373.0 12/09/2018   GLUCOSE 110 (H) 12/09/2018   CHOL 209 (H) 12/09/2018   TRIG 75.0 12/09/2018   HDL 64.00 12/09/2018   LDLCALC 130 (H) 12/09/2018   ALT 18 12/09/2018   AST 17 12/09/2018   NA 143 12/09/2018   K 3.8 12/09/2018   CL 108 12/09/2018   CREATININE 0.85 12/09/2018   BUN 13 12/09/2018   CO2 28 12/09/2018   TSH 1.10 12/09/2018   HGBA1C 5.7 12/09/2018  above lab in past non fasting   BP Readings from Last 3 Encounters:  05/18/19 120/70  04/02/19 (!) 162/94  03/10/19 124/82   The 10-year ASCVD risk score Mikey Bussing DC Jr., et al., 2013) is: 3.6%   Values used to calculate the score:     Age: 34 years     Sex: Female     Is Non-Hispanic African American: No      Diabetic: No     Tobacco smoker: No     Systolic Blood Pressure: 123456 mmHg     Is BP treated: Yes     HDL Cholesterol: 64 mg/dL     Total Cholesterol: 209 mg/dL  Labplan  reviewed with patient   ASSESSMENT AND PLAN:  Discussed the following assessment and plan:    ICD-10-CM   1. Visit for preventive health examination  123456 Basic metabolic panel    Lipid panel  Hemoglobin A1c    Hepatic function panel  2. Need for hepatitis C screening test  Z11.59 Hepatitis C antibody  3. Hyperlipidemia, unspecified hyperlipidemia type  99991111 Basic metabolic panel    Lipid panel    Hemoglobin A1c    Hepatic function panel  4. Hypertension, essential, benign  I10    controlled  follow mole on back  consider derm check and o removal  Return in about 1 year (around 05/17/2020) for cpx with labs, depending on results.  Patient Care Team: Jahson Emanuele, Standley Brooking, MD as PCP - General (Internal Medicine) Ardis Hughs, MD as Attending Physician (Urology) Valentina Shaggy, MD as Consulting Physician (Allergy and Immunology) Susa Day, MD as Consulting Physician (Orthopedic Surgery) Patient Instructions   Health Maintenance Due  Topic Date Due  . HIV Screening  Never done    Continue lifestyle intervention healthy eating and exercise .   Follow mole on back    Health Maintenance, Female Adopting a healthy lifestyle and getting preventive care are important in promoting health and wellness. Ask your health care provider about:  The right schedule for you to have regular tests and exams.  Things you can do on your own to prevent diseases and keep yourself healthy. What should I know about diet, weight, and exercise? Eat a healthy diet   Eat a diet that includes plenty of vegetables, fruits, low-fat dairy products, and lean protein.  Do not eat a lot of foods that are high in solid fats, added sugars, or sodium. Maintain a healthy weight Body mass index (BMI) is used to  identify weight problems. It estimates body fat based on height and weight. Your health care provider can help determine your BMI and help you achieve or maintain a healthy weight. Get regular exercise Get regular exercise. This is one of the most important things you can do for your health. Most adults should:  Exercise for at least 150 minutes each week. The exercise should increase your heart rate and make you sweat (moderate-intensity exercise).  Do strengthening exercises at least twice a week. This is in addition to the moderate-intensity exercise.  Spend less time sitting. Even light physical activity can be beneficial. Watch cholesterol and blood lipids Have your blood tested for lipids and cholesterol at 61 years of age, then have this test every 5 years. Have your cholesterol levels checked more often if:  Your lipid or cholesterol levels are high.  You are older than 61 years of age.  You are at high risk for heart disease. What should I know about cancer screening? Depending on your health history and family history, you may need to have cancer screening at various ages. This may include screening for:  Breast cancer.  Cervical cancer.  Colorectal cancer.  Skin cancer.  Lung cancer. What should I know about heart disease, diabetes, and high blood pressure? Blood pressure and heart disease  High blood pressure causes heart disease and increases the risk of stroke. This is more likely to develop in people who have high blood pressure readings, are of African descent, or are overweight.  Have your blood pressure checked: ? Every 3-5 years if you are 11-63 years of age. ? Every year if you are 71 years old or older. Diabetes Have regular diabetes screenings. This checks your fasting blood sugar level. Have the screening done:  Once every three years after age 36 if you are at a normal weight and have a low risk for diabetes.  More often and at a younger age if you  are overweight or have a high risk for diabetes. What should I know about preventing infection? Hepatitis B If you have a higher risk for hepatitis B, you should be screened for this virus. Talk with your health care provider to find out if you are at risk for hepatitis B infection. Hepatitis C Testing is recommended for:  Everyone born from [birth year] through 1967.  Anyone with known risk factors for hepatitis C. Sexually transmitted infections (STIs)  Get screened for STIs, including gonorrhea and chlamydia, if: ? You are sexually active and are younger than 61 years of age. ? You are older than 61 years of age and your health care provider tells you that you are at risk for this type of infection. ? Your sexual activity has changed since you were last screened, and you are at increased risk for chlamydia or gonorrhea. Ask your health care provider if you are at risk.  Ask your health care provider about whether you are at high risk for HIV. Your health care provider may recommend a prescription medicine to help prevent HIV infection. If you choose to take medicine to prevent HIV, you should first get tested for HIV. You should then be tested every 3 months for as long as you are taking the medicine. Pregnancy  If you are about to stop having your period (premenopausal) and you may become pregnant, seek counseling before you get pregnant.  Take 400 to 800 micrograms (mcg) of folic acid every day if you become pregnant.  Ask for birth control (contraception) if you want to prevent pregnancy. Osteoporosis and menopause Osteoporosis is a disease in which the bones lose minerals and strength with aging. This can result in bone fractures. If you are 36 years old or older, or if you are at risk for osteoporosis and fractures, ask your health care provider if you should:  Be screened for bone loss.  Take a calcium or vitamin D supplement to lower your risk of fractures.  Be given hormone  replacement therapy (HRT) to treat symptoms of menopause. Follow these instructions at home: Lifestyle  Do not use any products that contain nicotine or tobacco, such as cigarettes, e-cigarettes, and chewing tobacco. If you need help quitting, ask your health care provider.  Do not use street drugs.  Do not share needles.  Ask your health care provider for help if you need support or information about quitting drugs. Alcohol use  Do not drink alcohol if: ? Your health care provider tells you not to drink. ? You are pregnant, may be pregnant, or are planning to become pregnant.  If you drink alcohol: ? Limit how much you use to 0-1 drink a day. ? Limit intake if you are breastfeeding.  Be aware of how much alcohol is in your drink. In the [place] one drink equals one 12 oz bottle of beer (355 mL), one 5 oz glass of wine (148 mL), or one 1 oz glass of hard liquor (44 mL). General instructions  Schedule regular health, dental, and eye exams.  Stay current with your vaccines.  Tell your health care provider if: ? You often feel depressed. ? You have ever been abused or do not feel safe at home. Summary  Adopting a healthy lifestyle and getting preventive care are important in promoting health and wellness.  Follow your health care provider's instructions about healthy diet, exercising, and getting tested or screened for diseases.  Follow  your health care provider's instructions on monitoring your cholesterol and blood pressure. This information is not intended to replace advice given to you by your health care provider. Make sure you discuss any questions you have with your health care provider. Document Revised: 01/28/2018 Document Reviewed: 01/28/2018 Elsevier Patient Education  2020 [place] Doralee Kocak M.D.

## 2019-05-18 NOTE — Patient Instructions (Addendum)
Health Maintenance Due  Topic Date Due  . HIV Screening  Never done    Continue lifestyle intervention healthy eating and exercise .   Follow mole on back    Health Maintenance, Female Adopting a healthy lifestyle and getting preventive care are important in promoting health and wellness. Ask your health care provider about:  The right schedule for you to have regular tests and exams.  Things you can do on your own to prevent diseases and keep yourself healthy. What should I know about diet, weight, and exercise? Eat a healthy diet   Eat a diet that includes plenty of vegetables, fruits, low-fat dairy products, and lean protein.  Do not eat a lot of foods that are high in solid fats, added sugars, or sodium. Maintain a healthy weight Body mass index (BMI) is used to identify weight problems. It estimates body fat based on height and weight. Your health care provider can help determine your BMI and help you achieve or maintain a healthy weight. Get regular exercise Get regular exercise. This is one of the most important things you can do for your health. Most adults should:  Exercise for at least 150 minutes each week. The exercise should increase your heart rate and make you sweat (moderate-intensity exercise).  Do strengthening exercises at least twice a week. This is in addition to the moderate-intensity exercise.  Spend less time sitting. Even light physical activity can be beneficial. Watch cholesterol and blood lipids Have your blood tested for lipids and cholesterol at 61 years of age, then have this test every 5 years. Have your cholesterol levels checked more often if:  Your lipid or cholesterol levels are high.  You are older than 61 years of age.  You are at high risk for heart disease. What should I know about cancer screening? Depending on your health history and family history, you may need to have cancer screening at various ages. This may include screening  for:  Breast cancer.  Cervical cancer.  Colorectal cancer.  Skin cancer.  Lung cancer. What should I know about heart disease, diabetes, and high blood pressure? Blood pressure and heart disease  High blood pressure causes heart disease and increases the risk of stroke. This is more likely to develop in people who have high blood pressure readings, are of African descent, or are overweight.  Have your blood pressure checked: ? Every 3-5 years if you are 52-6 years of age. ? Every year if you are 67 years old or older. Diabetes Have regular diabetes screenings. This checks your fasting blood sugar level. Have the screening done:  Once every three years after age 63 if you are at a normal weight and have a low risk for diabetes.  More often and at a younger age if you are overweight or have a high risk for diabetes. What should I know about preventing infection? Hepatitis B If you have a higher risk for hepatitis B, you should be screened for this virus. Talk with your health care provider to find out if you are at risk for hepatitis B infection. Hepatitis C Testing is recommended for:  Everyone born from [birth year] through 1967.  Anyone with known risk factors for hepatitis C. Sexually transmitted infections (STIs)  Get screened for STIs, including gonorrhea and chlamydia, if: ? You are sexually active and are younger than 61 years of age. ? You are older than 61 years of age and your health care provider tells you that you are  at risk for this type of infection. ? Your sexual activity has changed since you were last screened, and you are at increased risk for chlamydia or gonorrhea. Ask your health care provider if you are at risk.  Ask your health care provider about whether you are at high risk for HIV. Your health care provider may recommend a prescription medicine to help prevent HIV infection. If you choose to take medicine to prevent HIV, you should first get tested for HIV.  You should then be tested every 3 months for as long as you are taking the medicine. Pregnancy  If you are about to stop having your period (premenopausal) and you may become pregnant, seek counseling before you get pregnant.  Take 400 to 800 micrograms (mcg) of folic acid every day if you become pregnant.  Ask for birth control (contraception) if you want to prevent pregnancy. Osteoporosis and menopause Osteoporosis is a disease in which the bones lose minerals and strength with aging. This can result in bone fractures. If you are 32 years old or older, or if you are at risk for osteoporosis and fractures, ask your health care provider if you should:  Be screened for bone loss.  Take a calcium or vitamin D supplement to lower your risk of fractures.  Be given hormone replacement therapy (HRT) to treat symptoms of menopause. Follow these instructions at home: Lifestyle  Do not use any products that contain nicotine or tobacco, such as cigarettes, e-cigarettes, and chewing tobacco. If you need help quitting, ask your health care provider.  Do not use street drugs.  Do not share needles.  Ask your health care provider for help if you need support or information about quitting drugs. Alcohol use  Do not drink alcohol if: ? Your health care provider tells you not to drink. ? You are pregnant, may be pregnant, or are planning to become pregnant.  If you drink alcohol: ? Limit how much you use to 0-1 drink a day. ? Limit intake if you are breastfeeding.  Be aware of how much alcohol is in your drink. In the [place] one drink equals one 12 oz bottle of beer (355 mL), one 5 oz glass of wine (148 mL), or one 1 oz glass of hard liquor (44 mL). General instructions  Schedule regular health, dental, and eye exams.  Stay current with your vaccines.  Tell your health care provider if: ? You often feel depressed. ? You have ever been abused or do not feel safe at  home. Summary  Adopting a healthy lifestyle and getting preventive care are important in promoting health and wellness.  Follow your health care provider's instructions about healthy diet, exercising, and getting tested or screened for diseases.  Follow your health care provider's instructions on monitoring your cholesterol and blood pressure. This information is not intended to replace advice given to you by your health care provider. Make sure you discuss any questions you have with your health care provider. Document Revised: 01/28/2018 Document Reviewed: 01/28/2018 Elsevier Patient Education  2020 [employer].

## 2019-05-19 LAB — HEPATITIS C ANTIBODY
Hepatitis C Ab: NONREACTIVE
SIGNAL TO CUT-OFF: 0.01 (ref <1.00)

## 2019-05-19 NOTE — Progress Notes (Signed)
Blood sugar borderline cholesterol about the same  Continue lifestyle intervention healthy eating and exercise .  The 10-year ASCVD risk score Mikey Bussing DC Brooke Bonito., et al., 2013) is: 3.5%( low risk)    Values used to calculate the score:     Age: 59 years     Sex: Female     Is Non-Hispanic African American: No     Diabetic: No     Tobacco smoker: No     Systolic Blood Pressure: 123456 mmHg     Is BP treated: Yes     HDL Cholesterol: 74.7 mg/dL     Total Cholesterol: 228 mg/dL

## 2019-05-29 ENCOUNTER — Other Ambulatory Visit: Payer: Self-pay | Admitting: Internal Medicine

## 2019-05-29 DIAGNOSIS — I10 Essential (primary) hypertension: Secondary | ICD-10-CM

## 2019-05-31 ENCOUNTER — Encounter: Payer: Self-pay | Admitting: Family Medicine

## 2019-05-31 ENCOUNTER — Ambulatory Visit: Payer: 59 | Admitting: Family Medicine

## 2019-05-31 ENCOUNTER — Other Ambulatory Visit: Payer: Self-pay

## 2019-05-31 VITALS — BP 136/84 | HR 73 | Ht 62.0 in | Wt 138.0 lb

## 2019-05-31 DIAGNOSIS — M999 Biomechanical lesion, unspecified: Secondary | ICD-10-CM | POA: Diagnosis not present

## 2019-05-31 DIAGNOSIS — M533 Sacrococcygeal disorders, not elsewhere classified: Secondary | ICD-10-CM | POA: Diagnosis not present

## 2019-05-31 MED ORDER — TIZANIDINE HCL 4 MG PO TABS: 4.0000 mg | ORAL_TABLET | Freq: Every day | ORAL | 0 refills | Status: DC

## 2019-05-31 NOTE — Assessment & Plan Note (Signed)
Chronic problem with exacerbation.  Does have known degenerative disc disease of the cervical and lumbar as well.  Discussed with patient about home exercises, icing regimen, and Zanaflex given for nighttime relief.  Warned of potential side effects.  Discussed medication management otherwise Follow-up again in 4 to 6 weeks

## 2019-05-31 NOTE — Progress Notes (Signed)
[place] Phone: [phone] Subjective:   Christina [name], am serving as a scribe for Dr. Hulan Saas. This visit occurred during the SARS-CoV-2 public health emergency.  Safety protocols were in place, including screening questions prior to the visit, additional usage of staff PPE, and extensive cleaning of exam room while observing appropriate contact time as indicated for disinfecting solutions.   I'm seeing this patient by the request  of:  Panosh, Standley Brooking, MD  CC: Low back pain follow-up  [id number]   03/10/2019 Patient continues to have pain in this area.  Patient continues to have more of an effusion in the knee that I think is causing more the problem secondary to the severe patellofemoral arthritis.  We discussed with patient the different treatment options including different types of injections and aspiration was done today.  Discussed bracing, home exercises.  Follow-up again in 4 to 6 weeks  Update 05/31/2019 Christina [name] is a 59 y.o. female coming in with complaint of low back pain over midline. Pain occurring in the morning when she wakes up. States that she feels that strength training exercises are causing her pain. Always stretches. Had epidural on 03/24/2018.  Patient has been doing relatively well overall though.  Has had some tightness recently.  Has always responded well to manipulation.    Past Medical History:  Diagnosis Date  . Arthritis   . Chicken pox   . Recurrent upper respiratory infection (URI)    Past Surgical History:  Procedure Laterality Date  . CHOLECYSTECTOMY     Social History   Socioeconomic History  . Marital status: Married    Spouse name: Not on file  . Number of children: Not on file  . Years of education: Not on file  . Highest education level: Not on file  Occupational History  . Not on file  Tobacco Use  . Smoking status: Former Research scientist (life sciences)  . Smokeless tobacco:  Never Used  Substance and Sexual Activity  . Alcohol use: Yes  . Drug use: [name]  . Sexual activity: Yes    Partners: Male  Other Topics Concern  . Not on file  Social History Narrative  . Not on file   Social Determinants of Health   Financial Resource Strain:   . Difficulty of Paying Living Expenses:   Food Insecurity:   . Worried About Charity fundraiser in the Last Year:   . Arboriculturist in the Last Year:   Transportation Needs:   . Film/video editor (Medical):   Marland Kitchen Lack of Transportation (Non-Medical):   Physical Activity:   . Days of Exercise per Week:   . Minutes of Exercise per Session:   Stress:   . Feeling of Stress :   Social Connections:   . Frequency of Communication with Friends and Family:   . Frequency of Social Gatherings with Friends and Family:   . Attends Religious Services:   . Active Member of Clubs or Organizations:   . Attends Archivist Meetings:   Marland Kitchen Marital Status:    [name] Known Allergies Family History  Problem Relation Age of Onset  . Cancer Mother   . Hyperlipidemia Mother   . Hypertension Mother   . Stroke Mother   . Cancer Father   . Hypertension Maternal Grandmother   . Hypertension Paternal Grandmother   . Stroke Paternal Grandmother   . Allergic rhinitis Neg Hx   .  Asthma Neg Hx      Current Outpatient Medications (Cardiovascular):  .  losartan (COZAAR) 50 MG tablet, TAKE 1 TABLET(50 MG) BY MOUTH DAILY  Current Outpatient Medications (Respiratory):  .  azelastine (ASTELIN) 0.1 % nasal spray, Place 2 sprays into both nostrils 2 (two) times daily as needed for rhinitis. Use in each nostril as directed .  cetirizine (ZYRTEC) 10 MG tablet, 1-2 tablets daily, prn .  ipratropium (ATROVENT) 0.06 % nasal spray, Place 2 sprays into both nostrils 3 (three) times daily. Marland Kitchen  levocetirizine (XYZAL) 5 MG tablet, Take 1 tablet (5 mg total) by mouth every evening.    Current Outpatient Medications (Other):  Marland Kitchen  Cholecalciferol  (VITAMIN D PO), Take 1 tablet by mouth daily. .  Probiotic Product (PROBIOTIC PO), Take 1 tablet by mouth daily as needed. Marland Kitchen  tiZANidine (ZANAFLEX) 4 MG tablet, Take 1 tablet (4 mg total) by mouth at bedtime.   Reviewed prior external information including notes and imaging from  primary care provider As well as notes that were available from care everywhere and other healthcare systems.  Past medical history, social, surgical and family history all reviewed in electronic medical record.  [name] pertanent information unless stated regarding to the chief complaint.   Review of Systems:  [name] headache, visual changes, nausea, vomiting, diarrhea, constipation, dizziness, abdominal pain, skin rash, fevers, chills, night sweats, weight loss, swollen lymph nodes,, joint swelling, chest pain, shortness of breath, mood changes. POSITIVE muscle aches, body aches  Objective  Blood pressure 136/84, pulse 73, height 5\' 2"  (1.575 m), weight 138 lb (62.6 kg), SpO2 97 %.   General: [name] apparent distress alert and oriented x3 mood and affect normal, dressed appropriately.  HEENT: Pupils equal, extraocular movements intact  Respiratory: Patient's speak in full sentences and does not appear short of breath  Cardiovascular: [name] lower extremity edema, non tender, [name] erythema  Neuro: Cranial nerves II through XII are intact, neurovascularly intact in all extremities with 2+ DTRs and 2+ pulses.  Gait normal with good balance and coordination.  Low back exam does have some tightness.  Mostly over the sacroiliac joints bilaterally.  Mild positive Faber bilaterally.  Negative straight leg test.  Patient does have some more pain now with extension of the back.  Osteopathic findings T8 extended rotated and side bent left L2 flexed rotated and side bent right Sacrum right on right    Impression and Recommendations:     This case required medical decision making of moderate complexity. The above documentation has been  reviewed and is accurate and complete Lyndal Pulley, DO       Note: This dictation was prepared with Dragon dictation along with smaller phrase technology. Any transcriptional errors that result from this process are unintentional.

## 2019-05-31 NOTE — Patient Instructions (Signed)
Zanaflex for next 3 nights then as needed See me in 7-8 weeks

## 2019-05-31 NOTE — Assessment & Plan Note (Signed)
   Decision today to treat with OMT was based on Physical Exam  After verbal consent patient was treated with HVLA, ME, FPR techniques in cervical, thoracic,  lumbar and sacral areas, all areas are chronic   Patient tolerated the procedure well with improvement in symptoms  Patient given exercises, stretches and lifestyle modifications  See medications in patient instructions if given  Patient will follow up in 4-8 weeks 

## 2019-06-01 ENCOUNTER — Other Ambulatory Visit: Payer: Self-pay

## 2019-06-01 ENCOUNTER — Other Ambulatory Visit (INDEPENDENT_AMBULATORY_CARE_PROVIDER_SITE_OTHER): Payer: 59

## 2019-06-01 ENCOUNTER — Encounter: Payer: Self-pay | Admitting: Internal Medicine

## 2019-06-01 ENCOUNTER — Telehealth: Payer: Self-pay | Admitting: Internal Medicine

## 2019-06-01 ENCOUNTER — Telehealth (INDEPENDENT_AMBULATORY_CARE_PROVIDER_SITE_OTHER): Payer: 59 | Admitting: Internal Medicine

## 2019-06-01 DIAGNOSIS — R350 Frequency of micturition: Secondary | ICD-10-CM

## 2019-06-01 DIAGNOSIS — R300 Dysuria: Secondary | ICD-10-CM

## 2019-06-01 DIAGNOSIS — Z87440 Personal history of urinary (tract) infections: Secondary | ICD-10-CM | POA: Diagnosis not present

## 2019-06-01 DIAGNOSIS — R3989 Other symptoms and signs involving the genitourinary system: Secondary | ICD-10-CM | POA: Diagnosis not present

## 2019-06-01 LAB — POCT URINALYSIS DIPSTICK
Glucose, UA: NEGATIVE
Nitrite, UA: POSITIVE
Protein, UA: NEGATIVE
Spec Grav, UA: 1.025 (ref 1.010–1.025)
Urobilinogen, UA: 2 E.U./dL — AB
pH, UA: 6 (ref 5.0–8.0)

## 2019-06-01 MED ORDER — NITROFURANTOIN MONOHYD MACRO 100 MG PO CAPS: 100.0000 mg | ORAL_CAPSULE | Freq: Two times a day (BID) | ORAL | 0 refills | Status: AC

## 2019-06-01 NOTE — Progress Notes (Signed)
Virtual Visit via Video Note  I connected with@ on 06/01/19 at  3:00 PM EDT by a video enabled telemedicine application and verified that I am speaking with the correct person using two identifiers.Failed cargility video  Had to do  phone Location patient: car travel  Location provider:work  office Persons participating in the virtual visit: patient, provider  [place] national recommendations  regarding COVID 19 pandemic   video visit is advised over in office visit for this patient.  Patient aware  of the limitations of evaluation and management by telemedicine and  availability of in person appointments. and agreed to proceed.  F  HPI: Christina [name] presents for video visit for  recurrent uti sx  Had dex urin about 4 days ago but  has been busy and then last night dysuria and  Frequency like in past  Used yeast vag this week also   See e coli pansensitive  12 20 rx with macrobid   ROS: See pertinent positives and negatives per HPI.  Past Medical History:  Diagnosis Date  . Arthritis   . Chicken pox   . Recurrent upper respiratory infection (URI)     Past Surgical History:  Procedure Laterality Date  . CHOLECYSTECTOMY      Family History  Problem Relation Age of Onset  . Cancer Mother   . Hyperlipidemia Mother   . Hypertension Mother   . Stroke Mother   . Cancer Father   . Hypertension Maternal Grandmother   . Hypertension Paternal Grandmother   . Stroke Paternal Grandmother   . Allergic rhinitis Neg Hx   . Asthma Neg Hx     Social History   Tobacco Use  . Smoking status: Former Research scientist (life sciences)  . Smokeless tobacco: Never Used  Substance Use Topics  . Alcohol use: Yes  . Drug use: No      Current Outpatient Medications:  .  azelastine (ASTELIN) 0.1 % nasal spray, Place 2 sprays into both nostrils 2 (two) times daily as needed for rhinitis. Use in each nostril as directed, Disp: 30 mL, Rfl: 5 .  cetirizine (ZYRTEC) 10 MG tablet, 1-2 tablets daily, prn, Disp: 60 tablet,  Rfl: 5 .  Cholecalciferol (VITAMIN D PO), Take 1 tablet by mouth daily., Disp: , Rfl:  .  levocetirizine (XYZAL) 5 MG tablet, Take 1 tablet (5 mg total) by mouth every evening., Disp: 30 tablet, Rfl: 5 .  losartan (COZAAR) 50 MG tablet, TAKE 1 TABLET(50 MG) BY MOUTH DAILY, Disp: 90 tablet, Rfl: 3 .  Probiotic Product (PROBIOTIC PO), Take 1 tablet by mouth daily as needed., Disp: , Rfl:  .  tiZANidine (ZANAFLEX) 4 MG tablet, Take 1 tablet (4 mg total) by mouth at bedtime., Disp: 30 tablet, Rfl: 0 .  ipratropium (ATROVENT) 0.06 % nasal spray, Place 2 sprays into both nostrils 3 (three) times daily., Disp: 18 mL, Rfl: 5 .  nitrofurantoin, macrocrystal-monohydrate, (MACROBID) 100 MG capsule, Take 1 capsule (100 mg total) by mouth 2 (two) times daily for 5 days. UTI, Disp: 10 capsule, Rfl: 0  EXAM: BP Readings from Last 3 Encounters:  05/31/19 136/84  05/18/19 120/70  04/02/19 (!) 162/94    VITALS per patient if applicable:  GENERAL: alert, oriented, appears well and in no acute distress  Nl speech    PSYCH/NEURO: pleasant and cooperative,some stress , speech and thought processing grossly intact Lab Results  Component Value Date   WBC 6.7 12/09/2018   HGB 13.2 12/09/2018   HCT 39.7 12/09/2018  PLT 373.0 12/09/2018   GLUCOSE 103 (H) 05/18/2019   CHOL 228 (H) 05/18/2019   TRIG 64.0 05/18/2019   HDL 74.70 05/18/2019   LDLCALC 140 (H) 05/18/2019   ALT 20 05/18/2019   AST 20 05/18/2019   NA 139 05/18/2019   K 4.2 05/18/2019   CL 103 05/18/2019   CREATININE 0.78 05/18/2019   BUN 14 05/18/2019   CO2 32 05/18/2019   TSH 1.10 12/09/2018   HGBA1C 5.7 05/18/2019   Urinalysis    Component Value Date/Time   LABSPEC 1.020 02/01/2019 1738   PHURINE 5.5 02/01/2019 1738   GLUCOSEU NEGATIVE 02/01/2019 1738   HGBUR SMALL (A) 02/01/2019 1738   BILIRUBINUR 1+ 06/01/2019 1133   KETONESUR NEGATIVE 02/01/2019 1738   PROTEINUR Negative 06/01/2019 1133   PROTEINUR NEGATIVE 02/01/2019 1738    UROBILINOGEN 2.0 (A) 06/01/2019 1133   UROBILINOGEN 0.2 02/01/2019 1738   NITRITE POSITIVE 06/01/2019 1133   NITRITE NEGATIVE 02/01/2019 1738   LEUKOCYTESUR Small (1+) (A) 06/01/2019 1133   LEUKOCYTESUR NEGATIVE 02/01/2019 1738     ASSESSMENT AND PLAN:  Discussed the following assessment and plan:    ICD-10-CM   1. Suspected UTI  R39.89   2. History of recurrent UTIs  Z87.440    Culture pending disc poss  Other intervention in future  Begin antibiotic macrobid for 5 days and   Expectant management.  Counseled.   Expectant management and discussion of plan and treatment with opportunity to ask questions and all were answered. The patient agreed with the plan and demonstrated an understanding of the instructions.   Advised to call back or seek an in-person evaluation if worsening  or having  further concerns . No follow-ups on file.    Shanon Ace, MD

## 2019-06-01 NOTE — Telephone Encounter (Signed)
Jinny Blossom can you please add urine culture to the labs so it is done  No matter the results of the UA  Thanks

## 2019-06-01 NOTE — Telephone Encounter (Signed)
Pt has an urinary infection and would like to know if medication can be called in like last time or if she needs to be seen? Thanks

## 2019-06-01 NOTE — Telephone Encounter (Signed)
I have already called this patient and scheduled her a virtual visit today at 3pm. Also asked patient to come by on her lunch break and leave a urine sample for POCT UA and Urine Culture. Patient verbalized an understanding.

## 2019-06-02 LAB — URINE CULTURE
MICRO NUMBER:: 10357470
Result:: NO GROWTH
SPECIMEN QUALITY:: ADEQUATE

## 2019-06-04 NOTE — Progress Notes (Signed)
Urine did not grow bacteria   this is inconsistent with the UA and your sx   so would take the antibiotic anyway    Please update how symptoms are doing

## 2019-06-07 NOTE — Progress Notes (Signed)
So no one answer . We can get  back to urology  consultation or  another OV  to discuss  . And we can repeat urine tests  after finish antibiotic

## 2019-06-10 ENCOUNTER — Other Ambulatory Visit: Payer: Self-pay

## 2019-06-10 ENCOUNTER — Other Ambulatory Visit (INDEPENDENT_AMBULATORY_CARE_PROVIDER_SITE_OTHER): Payer: 59

## 2019-06-10 DIAGNOSIS — Z87440 Personal history of urinary (tract) infections: Secondary | ICD-10-CM

## 2019-06-10 DIAGNOSIS — R350 Frequency of micturition: Secondary | ICD-10-CM | POA: Diagnosis not present

## 2019-06-10 LAB — URINALYSIS, ROUTINE W REFLEX MICROSCOPIC
Bilirubin Urine: NEGATIVE
Ketones, ur: NEGATIVE
Nitrite: NEGATIVE
Specific Gravity, Urine: <=1.005 — AB (ref 1.000–1.030)
Total Protein, Urine: NEGATIVE
Urine Glucose: NEGATIVE
Urobilinogen, UA: 0.2 (ref 0.0–1.0)
pH: 6.5 (ref 5.0–8.0)

## 2019-06-10 MED ORDER — SULFAMETHOXAZOLE-TRIMETHOPRIM 800-160 MG PO TABS: 1.0000 | ORAL_TABLET | Freq: Two times a day (BID) | ORAL | 0 refills | Status: DC

## 2019-06-10 NOTE — Telephone Encounter (Signed)
See result note  I sent in a different antimitotic

## 2019-06-10 NOTE — Telephone Encounter (Signed)
Leaving for a wedding and needs a refill on a Rx because her urine symptoms came back.   The patient would like to come n and leave a sample before she takes any over the counter medication.  Patient would like for a call back today please.  Please advise

## 2019-06-10 NOTE — Progress Notes (Signed)
Urine looks better but still has some blood in  it  I Sent septra DS ( rotating antibiotic)  to your pharmacy.

## 2019-06-10 NOTE — Telephone Encounter (Signed)
Please place orders  For U/A and  Microscopic and ur cx  at thje [facility] lab  She can do today and then decide next step   When did she take last dose of antibiotic and  Was she better before ran out?

## 2019-06-11 LAB — URINE CULTURE
MICRO NUMBER:: 10394436
SPECIMEN QUALITY:: ADEQUATE

## 2019-06-14 ENCOUNTER — Other Ambulatory Visit: Payer: Self-pay

## 2019-06-15 ENCOUNTER — Other Ambulatory Visit: Payer: Self-pay

## 2019-06-15 ENCOUNTER — Encounter: Payer: Self-pay | Admitting: Internal Medicine

## 2019-06-15 ENCOUNTER — Ambulatory Visit (INDEPENDENT_AMBULATORY_CARE_PROVIDER_SITE_OTHER): Payer: 59 | Admitting: Internal Medicine

## 2019-06-15 VITALS — BP 122/84 | HR 75 | Temp 98.0°F | Ht 62.0 in | Wt 138.0 lb

## 2019-06-15 DIAGNOSIS — Z87440 Personal history of urinary (tract) infections: Secondary | ICD-10-CM | POA: Diagnosis not present

## 2019-06-15 DIAGNOSIS — R399 Unspecified symptoms and signs involving the genitourinary system: Secondary | ICD-10-CM | POA: Diagnosis not present

## 2019-06-15 LAB — POCT URINALYSIS DIPSTICK
Bilirubin, UA: NEGATIVE
Blood, UA: POSITIVE
Glucose, UA: NEGATIVE
Ketones, UA: NEGATIVE
Leukocytes, UA: NEGATIVE
Nitrite, UA: NEGATIVE
Protein, UA: NEGATIVE
Spec Grav, UA: 1.025 (ref 1.010–1.025)
Urobilinogen, UA: 0.2 E.U./dL
pH, UA: 6 (ref 5.0–8.0)

## 2019-06-15 MED ORDER — SULFAMETHOXAZOLE-TRIMETHOPRIM 800-160 MG PO TABS: ORAL_TABLET | ORAL | 0 refills | Status: DC

## 2019-06-15 NOTE — Patient Instructions (Signed)
Can try  Prophylaxis as discussed  for now Will do uro referral  As indicated .

## 2019-06-15 NOTE — Progress Notes (Signed)
This visit occurred during the SARS-CoV-2 public health emergency.  Safety protocols were in place, including screening questions prior to the visit, additional usage of staff PPE, and extensive cleaning of exam room while observing appropriate contact time as indicated for disinfecting solutions.    Chief Complaint  Patient presents with  . Follow-up    Repeat UA, discuss Urology referral    HPI: Christina [name] 59 y.o. come in for  Fu of recurrent uti sx  Some documented but recent  Mult sp ur cx  She fninfhed antibiotic  Septra and feels normal today  But on going  Feels normal today   See  Last  notes.  ROS: See pertinent positives and negatives per HPI.ocass mid upper back pain  Driving and sitting .  Husband had hep b   In past but she never got it  Get UI when  flaring   Can control today .  She is a survivor and may prefers  female urologist   Past Medical History:  Diagnosis Date  . Arthritis   . Chicken pox   . Recurrent upper respiratory infection (URI)     Family History  Problem Relation Age of Onset  . Cancer Mother   . Hyperlipidemia Mother   . Hypertension Mother   . Stroke Mother   . Cancer Father   . Hypertension Maternal Grandmother   . Hypertension Paternal Grandmother   . Stroke Paternal Grandmother   . Allergic rhinitis Neg Hx   . Asthma Neg Hx     Social History   Socioeconomic History  . Marital status: Married    Spouse name: Not on file  . Number of children: Not on file  . Years of education: Not on file  . Highest education level: Not on file  Occupational History  . Not on file  Tobacco Use  . Smoking status: Former Research scientist (life sciences)  . Smokeless tobacco: Never Used  Substance and Sexual Activity  . Alcohol use: Yes  . Drug use: No  . Sexual activity: Yes    Partners: Male  Other Topics Concern  . Not on file  Social History Narrative  . Not on file   Social Determinants of Health   Financial Resource Strain:   . Difficulty of Paying  Living Expenses:   Food Insecurity:   . Worried About Charity fundraiser in the Last Year:   . Arboriculturist in the Last Year:   Transportation Needs:   . Film/video editor (Medical):   Marland Kitchen Lack of Transportation (Non-Medical):   Physical Activity:   . Days of Exercise per Week:   . Minutes of Exercise per Session:   Stress:   . Feeling of Stress :   Social Connections:   . Frequency of Communication with Friends and Family:   . Frequency of Social Gatherings with Friends and Family:   . Attends Religious Services:   . Active Member of Clubs or Organizations:   . Attends Archivist Meetings:   Marland Kitchen Marital Status:     Outpatient Medications Prior to Visit  Medication Sig Dispense Refill  . azelastine (ASTELIN) 0.1 % nasal spray Place 2 sprays into both nostrils 2 (two) times daily as needed for rhinitis. Use in each nostril as directed 30 mL 5  . cetirizine (ZYRTEC) 10 MG tablet 1-2 tablets daily, prn 60 tablet 5  . Cholecalciferol (VITAMIN D PO) Take 1 tablet by mouth daily.    Marland Kitchen levocetirizine (XYZAL)  5 MG tablet Take 1 tablet (5 mg total) by mouth every evening. 30 tablet 5  . losartan (COZAAR) 50 MG tablet TAKE 1 TABLET(50 MG) BY MOUTH DAILY 90 tablet 3  . Probiotic Product (PROBIOTIC PO) Take 1 tablet by mouth daily as needed.    Marland Kitchen tiZANidine (ZANAFLEX) 4 MG tablet Take 1 tablet (4 mg total) by mouth at bedtime. 30 tablet 0  . ipratropium (ATROVENT) 0.06 % nasal spray Place 2 sprays into both nostrils 3 (three) times daily. 18 mL 5  . sulfamethoxazole-trimethoprim (BACTRIM DS) 800-160 MG tablet Take 1 tablet by mouth 2 (two) times daily. (Patient not taking: Reported on 06/15/2019) 10 tablet 0   No facility-administered medications prior to visit.     EXAM:  BP 122/84   Pulse 75   Temp 98 F (36.7 C) (Temporal)   Ht 5\' 2"  (1.575 m)   Wt 138 lb (62.6 kg)   SpO2 98%   BMI 25.24 kg/m   Body mass index is 25.24 kg/m.  GENERAL: vitals reviewed and  listed above, alert, oriented, appears well hydrated and in no acute distress HEENT: atraumatic, conjunctiva  clear, no obvious abnormalities on inspection of external nose and ears OP : masked  NECK: no obvious masses on inspection palpation  CV: HRRR, no clubbing cyanosis or  peripheral edema nl cap refill  MS: moves all extremities without noticeable focal  abnormality PSYCH: pleasant and cooperative, no obvious depression or anxiety Lab Results  Component Value Date   WBC 6.7 12/09/2018   HGB 13.2 12/09/2018   HCT 39.7 12/09/2018   PLT 373.0 12/09/2018   GLUCOSE 103 (H) 05/18/2019   CHOL 228 (H) 05/18/2019   TRIG 64.0 05/18/2019   HDL 74.70 05/18/2019   LDLCALC 140 (H) 05/18/2019   ALT 20 05/18/2019   AST 20 05/18/2019   NA 139 05/18/2019   K 4.2 05/18/2019   CL 103 05/18/2019   CREATININE 0.78 05/18/2019   BUN 14 05/18/2019   CO2 32 05/18/2019   TSH 1.10 12/09/2018   HGBA1C 5.7 05/18/2019   BP Readings from Last 3 Encounters:  06/15/19 122/84  05/31/19 136/84  05/18/19 120/70   Urinalysis    Component Value Date/Time   COLORURINE YELLOW 06/10/2019 1022   APPEARANCEUR CLEAR 06/10/2019 1022   LABSPEC <=1.005 (A) 06/10/2019 1022   PHURINE 6.5 06/10/2019 1022   GLUCOSEU NEGATIVE 06/10/2019 1022   HGBUR SMALL (A) 06/10/2019 1022   BILIRUBINUR Negative 06/15/2019 0932   KETONESUR NEGATIVE 06/10/2019 1022   PROTEINUR Negative 06/15/2019 0932   PROTEINUR NEGATIVE 02/01/2019 1738   UROBILINOGEN 0.2 06/15/2019 0932   UROBILINOGEN 0.2 06/10/2019 1022   NITRITE Negative 06/15/2019 0932   NITRITE NEGATIVE 06/10/2019 1022   LEUKOCYTESUR Negative 06/15/2019 0932   LEUKOCYTESUR TRACE (A) 06/10/2019 1022     ASSESSMENT AND PLAN:  Discussed the following assessment and plan:  Lower urinary tract symptoms (LUTS) - resolved  - Plan: POCT urinalysis dipstick, Ambulatory referral to Urology  History of recurrent UTIs - Plan: Ambulatory referral to Urology Disc  IC  prophylaxis and or  topical estrogen   At this time I would still  Wish urology opinion   Also disc bladder dysfunction she gets incontinence when problem occurs  .  At this time  Will order Ic prophylaxis in interim  -Patient advised to return or notify health care team  if  new concerns arise. Outside external source  DATA REVIEWED:   Total time on date  of  service including record review ordering and plan of care: counseling  30 minutes   Patient Instructions  Can try  Prophylaxis as discussed  for now Will do uro referral  As indicated .      Standley Brooking. Aiyonna Lucado M.D.

## 2019-06-17 ENCOUNTER — Other Ambulatory Visit: Payer: Self-pay | Admitting: Surgical

## 2019-06-25 ENCOUNTER — Other Ambulatory Visit: Payer: Self-pay | Admitting: Internal Medicine

## 2019-06-25 NOTE — Telephone Encounter (Signed)
Last OV 06/15/2019  This prescription is not on her medication list

## 2019-06-27 ENCOUNTER — Other Ambulatory Visit: Payer: Self-pay | Admitting: Family Medicine

## 2019-06-28 ENCOUNTER — Telehealth: Payer: Self-pay | Admitting: Internal Medicine

## 2019-06-28 NOTE — Telephone Encounter (Signed)
Crystal from [facility] stated that they received the referral and the pt is scheduled for tomorrow, however they did not receive the urine culture results for the past year. They are needing those faxed over to [phone] before tomorrow.   Crystal can be reached at [phone]

## 2019-06-28 NOTE — Telephone Encounter (Signed)
Urine culture has been faxed to number provided. Fax confirmation has been received.

## 2019-06-29 ENCOUNTER — Other Ambulatory Visit: Payer: Self-pay

## 2019-06-29 MED ORDER — SOLIFENACIN SUCCINATE 5 MG PO TABS: 5.0000 mg | ORAL_TABLET | Freq: Every day | ORAL | 0 refills | Status: DC

## 2019-07-16 ENCOUNTER — Encounter: Payer: Self-pay | Admitting: Allergy & Immunology

## 2019-07-16 ENCOUNTER — Other Ambulatory Visit: Payer: Self-pay

## 2019-07-16 ENCOUNTER — Ambulatory Visit (INDEPENDENT_AMBULATORY_CARE_PROVIDER_SITE_OTHER): Payer: 59 | Admitting: Allergy & Immunology

## 2019-07-16 VITALS — BP 110/80 | HR 80 | Temp 98.0°F

## 2019-07-16 DIAGNOSIS — L508 Other urticaria: Secondary | ICD-10-CM | POA: Diagnosis not present

## 2019-07-16 DIAGNOSIS — J3089 Other allergic rhinitis: Secondary | ICD-10-CM | POA: Diagnosis not present

## 2019-07-16 DIAGNOSIS — J0100 Acute maxillary sinusitis, unspecified: Secondary | ICD-10-CM | POA: Diagnosis not present

## 2019-07-16 DIAGNOSIS — J302 Other seasonal allergic rhinitis: Secondary | ICD-10-CM | POA: Diagnosis not present

## 2019-07-16 MED ORDER — AZITHROMYCIN 250 MG PO TABS: ORAL_TABLET | ORAL | 0 refills | Status: DC

## 2019-07-16 NOTE — Patient Instructions (Addendum)
1. Seasonal and perennial allergic rhinitis - with overlying sinusitis - Start azithromycin 500 mg on the first day and 250 mg daily for the next 4 days. - Prednisone pack provided to start in case things worsen. - We will amend your daily regimen:   - Claritin (loratadine) 10 mg in the morning and Xyzal (levocetirizine) at night.  - Astelin two sprays per nostril up to twice daily daily.  - Add on nasal ipratropium one spray per nostril up to three times daily at the first sign of increased mucous production.  - Add on Sudafed on travel days (especially when you anticipate pressure changes, such as air travel)  - Consider changing antihistamines every few months (Allegra, Zyrtec, Xyzal).  2. Chronic urticaria -Continue with the antihistamines as above.  3. Return in about 6 months (around 01/16/2020). This can be an in-person, a virtual Webex or a telephone follow up visit.   Please inform us of any Emergency Department visits, hospitalizations, or changes in symptoms. Call us before going to the ED for breathing or allergy symptoms since we might be able to fit you in for a sick visit. Feel free to contact us anytime with any questions, problems, or concerns.  It was a pleasure to see you again today!  Websites that have reliable patient information: 1. American Academy of Asthma, Allergy, and Immunology: www.aaaai.org 2. Food Allergy Research and Education (FARE): foodallergy.org 3. Mothers of Asthmatics: http://www.asthmacommunitynetwork.org 4. American College of Allergy, Asthma, and Immunology: www.acaai.org   COVID-19 Vaccine Information can be found at: ShippingScam.co.uk For questions related to vaccine distribution or appointments, please email vaccine@[email or web address] or call [phone].     "Like" [place] on Facebook and Instagram for our latest updates!      Make sure you are registered to vote! If you have moved  or changed any of your contact information, you will need to get this updated before voting!  In some cases, you MAY be able to register to vote online: [email or web address]

## 2019-07-16 NOTE — Progress Notes (Signed)
FOLLOW UP  Date of Service/Encounter:  07/16/19   Assessment:   Seasonal and perennial allergic rhinitis(weeds, grasses, indoor molds, dust mites, cat, dog and cockroach)- with overlying acute sinusitis   Chronic urticaria - resolved   Plan/Recommendations:   1. Seasonal and perennial allergic rhinitis - with overlying sinusitis - Start azithromycin 500 mg on the first day and 250 mg daily for the next 4 days. - Prednisone pack provided to start in case things worsen. - We will amend your daily regimen:   - Claritin (loratadine) 10 mg in the morning and Xyzal (levocetirizine) at night.  - Astelin two sprays per nostril up to twice daily daily.  - Add on nasal ipratropium one spray per nostril up to three times daily at the first sign of increased mucous production.  - Add on Sudafed on travel days (especially when you anticipate pressure changes, such as air travel)  - Consider changing antihistamines every few months (Allegra, Zyrtec, Xyzal).  2. Chronic urticaria -Continue with the antihistamines as above.  3. Return in about 6 months (around 01/16/2020). This can be an in-person, a virtual Webex or a telephone follow up visit.   Subjective:   Letanya Smalling is a 59 y.o. female presenting today for follow up of  Chief Complaint  Patient presents with  . Sinus Problem  . Allergic Rhinitis     post nasal drip    Murdis Spies has a history of the following: Patient Active Problem List   Diagnosis Date Noted  . Patellofemoral arthritis of left knee 03/10/2019  . Acute medial meniscus tear of left knee 11/19/2018  . Sprain of medial collateral ligament of left knee 09/01/2018  . Neck strain, initial encounter 08/28/2018  . Degenerative disc disease, cervical 08/28/2018  . Lumbar radiculopathy, right 03/05/2018  . Right lateral epicondylitis 10/30/2017  . SI (sacroiliac) joint dysfunction 10/30/2017  . Nonallopathic lesion of sacral region 10/30/2017  . Nonallopathic  lesion of lumbar region 10/30/2017  . Nonallopathic lesion of thoracic region 10/30/2017  . History of recurrent UTIs 04/11/2017  . 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 Allergic rhinitis 10/29/2016    History obtained from: chart review and patient.  Marrianne is a 59 y.o. female presenting for a sick visit.  She was last seen in February 2021.  At that time, we continued Zyrtec in the morning and Xyzal at night.  We sent in a prescription for the Xyzal to see if it would be covered. We recommended Sudafed prior to travel days to prevent sinus infections. We continued with Astelin and we added on nasal ipratropium up to three times daily.   Since the last visit, she has stopped using her medications as recommended, which likely set her up for this current episode of sinusitis.  She tells me that she has been sick for approximately 1 month with sinus pressure and postnasal drip.  At 1 point, she was down to just the Astelin nose spray, which she was only using 1-2 times a day.  She stopped using the afternoon Xyzal, which is why she thinks she developed her current set of symptoms.  Her spring was actually fairly good, so she got off of her medications. She also neglected to use the Sudafed during travel days. She recently went to a wedding and did drive [place] get [place].  She does have some Bactrim at home to prevention of UTIs. She tried taking two weeks of this without any improvement in her symptoms.  They are on their way to a lake in [place] where they are spending the entire weekend with their four children and significant others. There are no grandchildren yet, which is a sore subject with Audrea Muscat. Apparently on her of daughters recently broke up with a boyfriend of 6+ years and instead started dating a large Administrator who she met through work.   Otherwise, there have been no changes to her past medical history, surgical history, family history, or social  history.    Review of Systems  Constitutional: Negative.  Negative for chills, fever, malaise/fatigue and weight loss.  HENT: Positive for congestion and sinus pain. Negative for ear discharge, ear pain and sore throat.        Positive for postnasal drip. Positive for throat clearing.  Eyes: Negative for pain, discharge and redness.  Respiratory: Negative for cough, sputum production, shortness of breath and wheezing.   Cardiovascular: Negative.  Negative for chest pain and palpitations.  Gastrointestinal: Negative for abdominal pain, constipation, diarrhea, heartburn, melena, nausea and vomiting.  Skin: Negative.  Negative for itching and rash.  Neurological: Negative for dizziness and headaches.  Endo/Heme/Allergies: Positive for environmental allergies. Does not bruise/bleed easily.       Objective:   Blood pressure 110/80, pulse 80, temperature 98 F (36.7 C), temperature source Temporal, SpO2 97 %. There is no height or weight on file to calculate BMI.   Physical Exam:  Physical Exam  Constitutional: She appears well-developed.  Pleasant female. Cooperative with the exam. Very talkative.   HENT:  Head: Normocephalic and atraumatic.  Right Ear: Tympanic membrane, external ear and ear canal normal.  Left Ear: Tympanic membrane, external ear and ear canal normal.  Nose: Mucosal edema and rhinorrhea present. No nasal deformity or septal deviation. No epistaxis. Right sinus exhibits no maxillary sinus tenderness and no frontal sinus tenderness. Left sinus exhibits no maxillary sinus tenderness and no frontal sinus tenderness.  Mouth/Throat: Uvula is midline and oropharynx is clear and moist. Mucous membranes are not pale and not dry.  Eyes: Pupils are equal, round, and reactive to light. Conjunctivae and EOM are normal. Right eye exhibits no chemosis and no discharge. Left eye exhibits no chemosis and no discharge. Right conjunctiva is not injected. Left conjunctiva is not  injected.  Cardiovascular: Normal rate, regular rhythm and normal heart sounds.  Respiratory: Effort normal and breath sounds normal. No accessory muscle usage. No tachypnea. No respiratory distress. She has no wheezes. She has no rhonchi. She has no rales. She exhibits no tenderness.  Moving air well in all lung fields. No increased work of breathing noted.  Lymphadenopathy:    She has no cervical adenopathy.  Neurological: She is alert.  Skin: No abrasion, no petechiae and no rash noted. Rash is not papular, not vesicular and not urticarial. No erythema. No pallor.  No eczematous or urticarial lesions noted.   Psychiatric: She has a normal mood and affect.     Diagnostic studies: none        Salvatore Marvel, MD  [facility]

## 2019-07-22 ENCOUNTER — Encounter: Payer: Self-pay | Admitting: Gastroenterology

## 2019-07-23 ENCOUNTER — Encounter: Payer: Self-pay | Admitting: Allergy & Immunology

## 2019-07-29 ENCOUNTER — Other Ambulatory Visit: Payer: Self-pay | Admitting: Family Medicine

## 2019-07-30 ENCOUNTER — Ambulatory Visit: Payer: 59 | Admitting: Family Medicine

## 2019-07-30 ENCOUNTER — Encounter: Payer: Self-pay | Admitting: Family Medicine

## 2019-07-30 ENCOUNTER — Other Ambulatory Visit: Payer: Self-pay

## 2019-07-30 VITALS — BP 120/88 | HR 68 | Ht 62.0 in | Wt 140.0 lb

## 2019-07-30 DIAGNOSIS — M999 Biomechanical lesion, unspecified: Secondary | ICD-10-CM

## 2019-07-30 DIAGNOSIS — M5416 Radiculopathy, lumbar region: Secondary | ICD-10-CM

## 2019-07-30 NOTE — Progress Notes (Signed)
[place] Phone: [phone] Subjective:    I'm seeing this patient by the request  of:  Panosh, Standley Brooking, MD  CC:   [id number]  Christina [name] is a 59 y.o. female coming in with complaint of back and neck pain Patient states   Medications patient has been prescribed:   Taking:         Reviewed prior external information including notes and imaging from previsou exam, outside providers and external EMR if available.   As well as notes that were available from care everywhere and other healthcare systems.  Past medical history, social, surgical and family history all reviewed in electronic medical record.  No pertanent information unless stated regarding to the chief complaint.   Past Medical History:  Diagnosis Date  . Arthritis   . Chicken pox   . Recurrent upper respiratory infection (URI)     No Known Allergies   Review of Systems:  No headache, visual changes, nausea, vomiting, diarrhea, constipation, dizziness, abdominal pain, skin rash, fevers, chills, night sweats, weight loss, swollen lymph nodes, body aches, joint swelling, chest pain, shortness of breath, mood changes. POSITIVE muscle aches  Objective  There were no vitals taken for this visit.   General: No apparent distress alert and oriented x3 mood and affect normal, dressed appropriately.  HEENT: Pupils equal, extraocular movements intact  Respiratory: Patient's speak in full sentences and does not appear short of breath  Cardiovascular: No lower extremity edema, non tender, no erythema  Neuro: Cranial nerves II through XII are intact, neurovascularly intact in all extremities with 2+ DTRs and 2+ pulses.  Gait normal with good balance and coordination.  MSK:  Non tender with full range of motion and good stability and symmetric strength and tone of shoulders, elbows, wrist, hip, knee and ankles bilaterally.  Back - Normal skin, Spine  with normal alignment and no deformity.  No tenderness to vertebral process palpation.  Paraspinous muscles are not tender and without spasm.   Range of motion is full at neck and lumbar sacral regions  Osteopathic findings  C2 flexed rotated and side bent right C6 flexed rotated and side bent left T3 extended rotated and side bent right inhaled rib T9 extended rotated and side bent left L2 flexed rotated and side bent right Sacrum right on right       Assessment and Plan: Lumbar radiculopathy, right Chronic, very mild exacerbation.  Patient seems to be improving slowly on her own at this point and does not need the true increase in frequency of office visits.  We will continue with the manipulation as long as it is helpful.  Discussed medication management including meloxicam and the muscle relaxer.  Follow-up with me again in 12 weeks.     Nonallopathic problems  Decision today to treat with OMT was based on Physical Exam  After verbal consent patient was treated with HVLA, ME, FPR techniques in  thoracic, lumbar, and sacral  areas  Patient tolerated the procedure well with improvement in symptoms  Patient given exercises, stretches and lifestyle modifications  See medications in patient instructions if given  Patient will follow up in 4-8 weeks      The above documentation has been reviewed and is accurate and complete Lyndal Pulley, DO       Note: This dictation was prepared with Dragon dictation along with smaller phrase technology. Any transcriptional errors that result from this process are  unintentional.

## 2019-07-30 NOTE — Progress Notes (Signed)
[place] Phone: [phone] Subjective:   Christina [name], am serving as a scribe for Dr. Hulan Saas. This visit occurred during the SARS-CoV-2 public health emergency.  Safety protocols were in place, including screening questions prior to the visit, additional usage of staff PPE, and extensive cleaning of exam room while observing appropriate contact time as indicated for disinfecting solutions.   I'm seeing this patient by the request  of:  Panosh, Standley Brooking, MD  CC:   [id number]  Christina [name] is a 59 y.o. female coming in with complaint of back and neck pain. OMT 05/31/2019. Patient states that she ha Medications patient has been prescribed:   Taking:         Reviewed prior external information including notes and imaging from previsou exam, outside providers and external EMR if available.   As well as notes that were available from care everywhere and other healthcare systems.  Past medical history, social, surgical and family history all reviewed in electronic medical record.  [name] pertanent information unless stated regarding to the chief complaint.   Past Medical History:  Diagnosis Date  . Arthritis   . Chicken pox   . Recurrent upper respiratory infection (URI)     [name] Known Allergies   Review of Systems:  [name] headache, visual changes, nausea, vomiting, diarrhea, constipation, dizziness, abdominal pain, skin rash, fevers, chills, night sweats, weight loss, swollen lymph nodes, body aches, joint swelling, chest pain, shortness of breath, mood changes. POSITIVE muscle aches  Objective  There were [name] vitals taken for this visit.   General: [name] apparent distress alert and oriented x3 mood and affect normal, dressed appropriately.  HEENT: Pupils equal, extraocular movements intact  Respiratory: Patient's speak in full sentences and does not appear short of breath  Cardiovascular: [name] lower  extremity edema, non tender, [name] erythema  Neuro: Cranial nerves II through XII are intact, neurovascularly intact in all extremities with 2+ DTRs and 2+ pulses.  Gait normal with good balance and coordination.  MSK:  Non tender with full range of motion and good stability and symmetric strength and tone of shoulders, elbows, wrist, hip, knee and ankles bilaterally.  Back - Normal skin, Spine with normal alignment and [name] deformity.  [name] tenderness to vertebral process palpation.  Paraspinous muscles are not tender and without spasm.   Range of motion is full at neck and lumbar sacral regions  Osteopathic findings  C2 flexed rotated and side bent right C6 flexed rotated and side bent left T3 extended rotated and side bent right inhaled rib T9 extended rotated and side bent left L2 flexed rotated and side bent right Sacrum right on right       Assessment and Plan:    Nonallopathic problems  Decision today to treat with OMT was based on Physical Exam  After verbal consent patient was treated with HVLA, ME, FPR techniques in cervical, rib, thoracic, lumbar, and sacral  areas  Patient tolerated the procedure well with improvement in symptoms  Patient given exercises, stretches and lifestyle modifications  See medications in patient instructions if given  Patient will follow up in 4-8 weeks      The above documentation has been reviewed and is accurate and complete Christina [name]       Note: This dictation was prepared with Dragon dictation along with smaller phrase technology. Any transcriptional errors that result from this process are unintentional.

## 2019-07-30 NOTE — Assessment & Plan Note (Signed)
Chronic, very mild exacerbation.  Patient seems to be improving slowly on her own at this point and does not need the true increase in frequency of office visits.  We will continue with the manipulation as long as it is helpful.  Discussed medication management including meloxicam and the muscle relaxer.  Follow-up with me again in 12 weeks.

## 2019-08-25 ENCOUNTER — Ambulatory Visit: Payer: 59 | Admitting: Gastroenterology

## 2019-09-03 NOTE — Telephone Encounter (Signed)
So losartan is an ace receptor blocker and not a diuretic.   Although is there is a formulation that has a diuretic( called Hyzaar)  Added, that is not the one on your list.  I am also  not aware of losartan  Being a major cause of bladder irritation .  If further ?s   Or want to change  Medication  Can make a video visit   BP Readings from Last 3 Encounters:  07/30/19 120/88  07/16/19 110/80  06/15/19 122/84

## 2019-09-14 ENCOUNTER — Encounter: Payer: Self-pay | Admitting: Family Medicine

## 2019-09-14 MED ORDER — PREDNISONE 50 MG PO TABS: 50.0000 mg | ORAL_TABLET | Freq: Every day | ORAL | 0 refills | Status: DC

## 2019-09-16 ENCOUNTER — Other Ambulatory Visit: Payer: Self-pay

## 2019-09-16 ENCOUNTER — Other Ambulatory Visit: Payer: Self-pay | Admitting: Internal Medicine

## 2019-09-16 DIAGNOSIS — Z1231 Encounter for screening mammogram for malignant neoplasm of breast: Secondary | ICD-10-CM

## 2019-09-16 DIAGNOSIS — M5416 Radiculopathy, lumbar region: Secondary | ICD-10-CM

## 2019-09-22 ENCOUNTER — Encounter: Payer: Self-pay | Admitting: Nurse Practitioner

## 2019-09-22 ENCOUNTER — Ambulatory Visit: Payer: 59 | Admitting: Nurse Practitioner

## 2019-09-22 ENCOUNTER — Ambulatory Visit
Admission: RE | Admit: 2019-09-22 | Discharge: 2019-09-22 | Disposition: A | Discharge status: Home / Self Care | Payer: 59 | Source: Ambulatory Visit | Attending: Family Medicine | Admitting: Family Medicine

## 2019-09-22 VITALS — BP 128/70 | HR 55 | Ht 61.0 in | Wt 143.3 lb

## 2019-09-22 DIAGNOSIS — R194 Change in bowel habit: Secondary | ICD-10-CM

## 2019-09-22 DIAGNOSIS — M5416 Radiculopathy, lumbar region: Secondary | ICD-10-CM

## 2019-09-22 MED ORDER — IOPAMIDOL (ISOVUE-M 200) INJECTION 41%: 1.0000 mL | Freq: Once | INTRAMUSCULAR | Status: DC

## 2019-09-22 MED ORDER — METHYLPREDNISOLONE ACETATE 40 MG/ML INJ SUSP (RADIOLOG: 120.0000 mg | Freq: Once | INTRAMUSCULAR | Status: DC

## 2019-09-22 NOTE — Patient Instructions (Addendum)
If you are age 61 or older, your body mass index should be between 23-30. Your Body mass index is 27.08 kg/m. If this is out of the aforementioned range listed, please consider follow up with your Primary Care Provider.  If you are age 69 or younger, your body mass index should be between 19-25. Your Body mass index is 27.08 kg/m. If this is out of the aformentioned range listed, please consider follow up with your Primary Care Provider.   Please purchase the following medications over the counter and take as directed: Benefiber: Take daily as directed on the bottle  Increase your water intake to 4 to 6 bottles daily.  We have scheduled you for a follow up visit with Dr. Silverio Decamp on Tuesday, 11-23-19 at 9:30am.  Thank you for entrusting me with your care and for choosing [facility], Tye Savoy, NP-C

## 2019-09-22 NOTE — Progress Notes (Signed)
ASSESSMENT / PLAN:    Christina [name] is a 59 y.o. female with PMH / [place] significant for,  but not necessarily limited to: chronic low back pain, cholecystectomy, HTN  # Chronically altered bowel habits with associated urgency / incontinence -- has three soft BMs in am about every two days and no BMs in between.  --May be helpful to better regulate BMs. Goal would be to have a normal BM every 1-2 days.  --Trial of daily Benefiber --Follow up in clinic in a few weeks.  --Consider pelvic floor PT if things don't improve     HPI:     Chief Complaint:  Irregular bowel habits.     Christina [name] 59 year old female, new to the practice, here for abnormal bowel habits. Over the last 10 years her bowel pattern has been irregular and she thinks it started around time of cholecystectomy. She may go a couple of day without a bm then the next day may have have three BMs in the am and none the rest of the day. Stools are soft, generally formed.  She doesn't have diarrhea. She has some urgency associated with BMs and occasionally unable to make it to the bathroom. No leakage in between BMs. Also had urinary incontinence, s/p bladder sling in Jan 2020 but still has some stress incontinence.  No blood in stools. Drinks one cup of coffee in the am. She drinks 1-2 glasses of water a day on average. Eats 1/2 muffin every morning but not much fiber other than that. Last colonoscopy in 2014. No [place] of colon cancer. Her weight is stable, would like to lose weight.    Data Reviewed:  05/09/19 CMP unremarkable  PREVIOUS ENDOSCOPIC EVALUATIONS / GI STUDIES:  April 2014 Normal screening colonoscopy with Ivar Bury   Past Medical History:  Diagnosis Date  . Arthritis   . Chicken pox   . Recurrent upper respiratory infection (URI)      Past Surgical History:  Procedure Laterality Date  . BLADDER SUSPENSION    . CHOLECYSTECTOMY     Family History  Problem Relation Age of Onset  . Cancer Mother   .  Hyperlipidemia Mother   . Hypertension Mother   . Stroke Mother   . Breast cancer Mother   . Cancer Father   . Prostate cancer Father   . Hypertension Maternal Grandmother   . Hypertension Paternal Grandmother   . Stroke Paternal Grandmother   . Allergic rhinitis Neg Hx   . Asthma Neg Hx    Social History   Tobacco Use  . Smoking status: Former Research scientist (life sciences)  . Smokeless tobacco: Never Used  Vaping Use  . Vaping Use: Never used  Substance Use Topics  . Alcohol use: Yes  . Drug use: No   Current Outpatient Medications  Medication Sig Dispense Refill  . azelastine (ASTELIN) 0.1 % nasal spray Place 2 sprays into both nostrils 2 (two) times daily as needed for rhinitis. Use in each nostril as directed 30 mL 5  . cetirizine (ZYRTEC) 10 MG tablet 1-2 tablets daily, prn 60 tablet 5  . Cholecalciferol (VITAMIN D PO) Take 1 tablet by mouth daily.    Marland Kitchen ipratropium (ATROVENT) 0.06 % nasal spray Place 2 sprays into both nostrils 3 (three) times daily. (Patient not taking: Reported on 07/16/2019) 18 mL 5  . levocetirizine (XYZAL) 5 MG tablet Take 1 tablet (5 mg total) by mouth every evening. 30 tablet  5  . losartan (COZAAR) 50 MG tablet TAKE 1 TABLET(50 MG) BY MOUTH DAILY 90 tablet 3  . oxybutynin (DITROPAN XL) 15 MG 24 hr tablet Take by mouth.    . Probiotic Product (PROBIOTIC PO) Take 1 tablet by mouth daily as needed.    Marland Kitchen tiZANidine (ZANAFLEX) 4 MG tablet TAKE 1 TABLET(4 MG) BY MOUTH AT BEDTIME 30 tablet 0   No current facility-administered medications for this visit.   No Known Allergies   Review of Systems: Positive for allergy / sinus problems, back pain.  All other systems reviewed and negative except where noted in HPI.   Creatinine clearance cannot be calculated (Patient's most recent lab result is older than the maximum 21 days allowed.)   Physical Exam:    Wt Readings from Last 3 Encounters:  09/22/19 143 lb 4.8 oz (65 kg)  07/30/19 140 lb (63.5 kg)  06/15/19 138 lb (62.6  kg)    BP 128/70   Pulse (!) 55   Ht 5\' 1"  (1.549 m)   Wt 143 lb 4.8 oz (65 kg)   SpO2 99%   BMI 27.08 kg/m  Constitutional:  Pleasant female in no acute distress. Psychiatric: Normal mood and affect. Behavior is normal. EENT: Pupils normal.  Conjunctivae are normal. No scleral icterus. Neck supple.  Cardiovascular: Normal rate, regular rhythm. No edema Pulmonary/chest: Effort normal and breath sounds normal. No wheezing, rales or rhonchi. Abdominal: Soft, nondistended, nontender. Bowel sounds active throughout. There are no masses palpable. No hepatomegaly. Neurological: Alert and oriented to person place and time. Skin: Skin is warm and dry. No rashes noted.  Tye Savoy, NP  09/22/2019, 9:39 AM

## 2019-09-22 NOTE — Discharge Instructions (Signed)

## 2019-09-23 ENCOUNTER — Ambulatory Visit: Payer: 59 | Admitting: Family Medicine

## 2019-09-26 ENCOUNTER — Encounter: Payer: Self-pay | Admitting: Family Medicine

## 2019-10-04 NOTE — Progress Notes (Signed)
Reviewed and agree with documentation and assessment and plan. K. Veena Jeury Mcnab , MD   

## 2019-10-05 ENCOUNTER — Other Ambulatory Visit: Payer: Self-pay

## 2019-10-05 DIAGNOSIS — M5416 Radiculopathy, lumbar region: Secondary | ICD-10-CM

## 2019-10-07 ENCOUNTER — Encounter: Payer: Self-pay | Admitting: Family Medicine

## 2019-10-13 ENCOUNTER — Ambulatory Visit (INDEPENDENT_AMBULATORY_CARE_PROVIDER_SITE_OTHER): Payer: 59 | Admitting: Allergy & Immunology

## 2019-10-13 ENCOUNTER — Other Ambulatory Visit: Payer: Self-pay

## 2019-10-13 ENCOUNTER — Ambulatory Visit
Admission: RE | Admit: 2019-10-13 | Discharge: 2019-10-13 | Disposition: A | Discharge status: Home / Self Care | Payer: 59 | Source: Ambulatory Visit | Attending: Family Medicine | Admitting: Family Medicine

## 2019-10-13 ENCOUNTER — Encounter: Payer: Self-pay | Admitting: Allergy & Immunology

## 2019-10-13 DIAGNOSIS — M5416 Radiculopathy, lumbar region: Secondary | ICD-10-CM

## 2019-10-13 DIAGNOSIS — J302 Other seasonal allergic rhinitis: Secondary | ICD-10-CM | POA: Diagnosis not present

## 2019-10-13 DIAGNOSIS — J3089 Other allergic rhinitis: Secondary | ICD-10-CM | POA: Diagnosis not present

## 2019-10-13 DIAGNOSIS — L239 Allergic contact dermatitis, unspecified cause: Secondary | ICD-10-CM

## 2019-10-13 MED ORDER — METHYLPREDNISOLONE ACETATE 40 MG/ML INJ SUSP (RADIOLOG
120.0000 mg | Freq: Once | INTRAMUSCULAR | Status: AC
  Administered 2019-10-13: 120 mg via EPIDURAL

## 2019-10-13 MED ORDER — IOPAMIDOL (ISOVUE-M 200) INJECTION 41%
1.0000 mL | Freq: Once | INTRAMUSCULAR | Status: AC
  Administered 2019-10-13: 1 mL via EPIDURAL

## 2019-10-13 MED ORDER — TRIAMCINOLONE ACETONIDE 0.1 % EX OINT: 1.0000 "application " | TOPICAL_OINTMENT | Freq: Two times a day (BID) | CUTANEOUS | 3 refills | Status: DC

## 2019-10-13 MED ORDER — PREDNISONE 10 MG PO TABS: ORAL_TABLET | ORAL | 0 refills | Status: DC

## 2019-10-13 NOTE — Progress Notes (Signed)
RE: Christina [name] MRN: [id number] DOB: 02-Sep-1960 Date of Telemedicine Visit: 10/13/2019  Referring provider: Burnis Medin, MD Primary care provider: Burnis Medin, MD  Chief Complaint: Rash (started breaking out in a rash last week. seemed to be worsening. she found triamcinolone cream at the [facility] that exp 2018. it did help some but not completely resolved. )   Telemedicine Follow Up Visit via Telephone: I connected with Christina [name] for a follow up on 10/13/19 by telephone and verified that I am speaking with the correct person using two identifiers.   I discussed the limitations, risks, security and privacy concerns of performing an evaluation and management service by telephone and the availability of in person appointments. I also discussed with the patient that there may be a patient responsible charge related to this service. The patient expressed understanding and agreed to proceed.  Patient is at home.  Provider is at the office.  Visit start time: 3:07 PM Visit end time: 3:29 PM Insurance consent/check in by: [facility] consent and medical assistant/nurse: Kayla  History of Present Illness:  She is a 59 y.o. female, who is being followed for recurrent contact dermatitis as well as perennial and seasonal allergic rhinitis. Her previous allergy office visit was in May 2021 with myself.  At that time, we started her on azithromycin as well as prednisone for sinusitis.  We changed her daily regimen to 10 mg of loratadine in the morning and 5 mg of denies a headache.  We also started Astelin 2 sprays per nostril up to twice daily with the addition of ipratropium and Sudafed at the first sign of any future infections.  Her chronic urticaria was controlled with antihistamines.  Since the last visit, she has mostly done well. However, around one week ago she started having itching and a rash.  She thinks that it is poison ivy.  She does not describe it as poison ivy over the phone.   She thinks she got it from her dog, as she has been vigilant about keeping poison ivy out of her yard as far as she can eczema.  She actually gets her husband to go outside and pull it up in the yard.  She found some kind of tube of OTC ointment at the [facility]. She did have some triamcinolone all of her dots, but it was controlling the inflammation better but not getting rid it.  She tells me it has spread to multiple parts of her body.  She has had no breathing difficulties, throat swelling, or other systemic symptoms from this.  She is fully vaccinated to his COVID-19.  All of her kids are finally vaccinated as well.    Otherwise, there have been no changes to her past medical history, surgical history, family history, or social history.  Assessment and Plan:  Christina [name] is a 59 y.o. female with:  Seasonal and perennial allergic rhinitis(weeds, grasses, indoor molds, dust mites, cat, dog and cockroach)- with overlying acute sinusitis   Chronic urticaria - resolved    We are to start her on systemic prednisone.  I also sent in a new triamcinolone prescription for her to use as needed for future episodes.  She is perfectly fine with this plan.  Her allergic rhinitis symptoms are controlled with the regimen prescribed at the last visit.  She has not needed antibiotics at all since the last visit.  Diagnostics: None.  Medication List:  Current Outpatient Medications  Medication Sig Dispense Refill  .  azelastine (ASTELIN) 0.1 % nasal spray Place 2 sprays into both nostrils 2 (two) times daily as needed for rhinitis. Use in each nostril as directed 30 mL 5  . cetirizine (ZYRTEC) 10 MG tablet 1-2 tablets daily, prn 60 tablet 5  . Cholecalciferol (VITAMIN D PO) Take 1 tablet by mouth daily.    Marland Kitchen levocetirizine (XYZAL) 5 MG tablet Take 1 tablet (5 mg total) by mouth every evening. 30 tablet 5  . losartan (COZAAR) 50 MG tablet TAKE 1 TABLET(50 MG) BY MOUTH DAILY 90 tablet 3  . oxybutynin  (DITROPAN XL) 15 MG 24 hr tablet Take by mouth.    . Probiotic Product (PROBIOTIC PO) Take 1 tablet by mouth daily as needed.    Marland Kitchen tiZANidine (ZANAFLEX) 4 MG tablet TAKE 1 TABLET(4 MG) BY MOUTH AT BEDTIME 30 tablet 0  . predniSONE (DELTASONE) 10 MG tablet Take 3 tabs (30mg ) twice daily for 3 days, then 2 tabs (20mg ) twice daily for 3 days, then 1 tab (10mg ) twice daily for 3 days, then STOP. 36 tablet 0   No current facility-administered medications for this visit.   Allergies: No Known Allergies I reviewed her past medical history, social history, family history, and environmental history and no significant changes have been reported from previous visits.  Review of Systems  Constitutional: Negative for activity change and appetite change.  HENT: Negative for congestion, postnasal drip, rhinorrhea, sinus pressure and sore throat.   Eyes: Negative for pain, discharge, redness and itching.  Respiratory: Negative for shortness of breath, wheezing and stridor.   Gastrointestinal: Negative for diarrhea, nausea and vomiting.  Musculoskeletal: Negative for arthralgias, joint swelling and myalgias.  Skin: Negative for rash.  Allergic/Immunologic: Negative for environmental allergies and food allergies.    Objective:  Physical exam not obtained as encounter was done via telephone.   Previous notes and tests were reviewed.  I discussed the assessment and treatment plan with the patient. The patient was provided an opportunity to ask questions and all were answered. The patient agreed with the plan and demonstrated an understanding of the instructions.   The patient was advised to call back or seek an in-person evaluation if the symptoms worsen or if the condition fails to improve as anticipated.  I provided 22 minutes of non-face-to-face time during this encounter.  It was my pleasure to participate in [facility] care today. Please feel free to contact me with any questions or concerns.    Sincerely,  Valentina Shaggy, MD

## 2019-10-14 ENCOUNTER — Encounter: Payer: Self-pay | Admitting: Allergy & Immunology

## 2019-10-14 ENCOUNTER — Ambulatory Visit
Admission: RE | Admit: 2019-10-14 | Discharge: 2019-10-14 | Disposition: A | Discharge status: Home / Self Care | Payer: 59 | Source: Ambulatory Visit | Attending: Internal Medicine | Admitting: Internal Medicine

## 2019-10-14 DIAGNOSIS — Z1231 Encounter for screening mammogram for malignant neoplasm of breast: Secondary | ICD-10-CM

## 2019-10-17 ENCOUNTER — Encounter: Payer: Self-pay | Admitting: Family Medicine

## 2019-11-01 ENCOUNTER — Ambulatory Visit: Payer: 59 | Admitting: Orthopedic Surgery

## 2019-11-10 ENCOUNTER — Ambulatory Visit: Payer: 59 | Admitting: Family Medicine

## 2019-11-10 ENCOUNTER — Ambulatory Visit: Payer: 59 | Admitting: Orthopedic Surgery

## 2019-11-11 MED ORDER — SULFAMETHOXAZOLE-TRIMETHOPRIM 800-160 MG PO TABS
ORAL_TABLET | ORAL | 0 refills | Status: DC
Start: 2019-11-11 — End: 2020-01-07

## 2019-11-11 NOTE — Telephone Encounter (Signed)
Glad it is help ing  I sent in a refill to  [facility]

## 2019-11-12 NOTE — Telephone Encounter (Signed)
Doubt it is  Serious but if ongoing make a visit

## 2019-11-17 ENCOUNTER — Ambulatory Visit: Payer: 59 | Admitting: Orthopedic Surgery

## 2019-11-23 ENCOUNTER — Ambulatory Visit: Payer: 59 | Admitting: Gastroenterology

## 2019-11-30 ENCOUNTER — Other Ambulatory Visit: Payer: Self-pay

## 2019-11-30 ENCOUNTER — Ambulatory Visit: Payer: 59 | Admitting: Family Medicine

## 2019-11-30 ENCOUNTER — Encounter: Payer: Self-pay | Admitting: Family Medicine

## 2019-11-30 VITALS — BP 130/82 | HR 74 | Ht 61.0 in | Wt 144.0 lb

## 2019-11-30 DIAGNOSIS — M5416 Radiculopathy, lumbar region: Secondary | ICD-10-CM | POA: Diagnosis not present

## 2019-11-30 DIAGNOSIS — M7711 Lateral epicondylitis, right elbow: Secondary | ICD-10-CM | POA: Diagnosis not present

## 2019-11-30 DIAGNOSIS — M999 Biomechanical lesion, unspecified: Secondary | ICD-10-CM

## 2019-11-30 NOTE — Assessment & Plan Note (Signed)
Mild overall, patient will start with formal physical therapy.  Discussed avoiding lifting things overhead.

## 2019-11-30 NOTE — Patient Instructions (Signed)
Dr. Lynann Bologna at [facility] will call you Take 1 gabapentin at night If you want to continue manipulation see me again in 2 months

## 2019-11-30 NOTE — Assessment & Plan Note (Signed)
Patient does have more of a left-sided L5 nerve root impingement.  Continuing to have difficulty.  No longer responding to the nerve root or epidural injections.  Patient has failed all other conservative therapy at this time.  Patient could be a potential candidate for microdiscectomy.  Patient will be referred accordingly.  Patient will discuss the possibility of surgical intervention otherwise can come back if patient would like to consider the osteopathic manipulation but warned her that this likely will not be long-term fix.  Total time with patient going over the MRI, different treatment options and prognosis 37 minutes.

## 2019-11-30 NOTE — Progress Notes (Signed)
[facility] [address] [place] Phone: [phone] Subjective:   I Christina [name] am serving as a Education administrator for Dr. Hulan Saas.  This visit occurred during the SARS-CoV-2 public health emergency.  Safety protocols were in place, including screening questions prior to the visit, additional usage of staff PPE, and extensive cleaning of exam room while observing appropriate contact time as indicated for disinfecting solutions.   I'm seeing this patient by the request  of:  Panosh, Standley Brooking, MD  CC: Low back pain  [id number]  Alura Olveda Dais is a 59 y.o. female coming in with complaint of back and neck pain. OMT 07/30/2019. Patient States she has tingling down her left leg since her last injection. Epidural did not work this past time. Can't lift things. Wants a better understanding of what is actually going on.  Patient states that this point she states that it is fairly unrelenting.  He does want to try manipulation still and try to do it on a regular basis but is wondering if she can discuss the more aggressive therapies.  Medications patient has been prescribed: Zanaflex  Taking: Yes         Reviewed prior external information including notes and imaging from previsou exam, outside providers and external EMR if available.   As well as notes that were available from care everywhere and other healthcare systems.  Past medical history, social, surgical and family history all reviewed in electronic medical record.  No pertanent information unless stated regarding to the chief complaint.   Past Medical History:  Diagnosis Date  . Arthritis   . Chicken pox   . Recurrent upper respiratory infection (URI)     No Known Allergies   Review of Systems:  No headache, visual changes, nausea, vomiting, diarrhea, constipation, dizziness, abdominal pain, skin rash, fevers, chills, night sweats, weight loss, swollen lymph nodes, body aches,  joint swelling, chest pain, shortness of breath, mood changes. POSITIVE muscle aches  Objective  There were no vitals taken for this visit.   General: No apparent distress alert and oriented x3 mood and affect normal, dressed appropriately.  HEENT: Pupils equal, extraocular movements intact  Respiratory: Patient's speak in full sentences and does not appear short of breath  Cardiovascular: No lower extremity edema, non tender, no erythema  Neuro: Cranial nerves II through XII are intact, neurovascularly intact in all extremities with 2+ DTRs and 2+ pulses.  Gait normal with good balance and coordination.  MSK:   Right-sided elbow does have some tenderness over the lateral epicondylar region.  Tender to palpation in this area.  Patient does have some pain with resisted wrist extension. Back -low back exam shows the patient does have loss of lordosis.  Mild positive straight leg test on the left side.  Negative on the right.  Patient does have tightness with Corky Sox on the left as well.  Good range of motion no noted.  Osteopathic findings   T9 extended rotated and side bent left L2 flexed rotated and side bent right Sacrum left on left       Assessment and Plan:  Right lateral epicondylitis Mild overall, patient will start with formal physical therapy.  Discussed avoiding lifting things overhead.  Left lumbar radiculopathy Patient does have more of a left-sided L5 nerve root impingement.  Continuing to have difficulty.  No longer responding to the nerve root or epidural injections.  Patient has failed all other conservative therapy at this time.  Patient could be a potential candidate for microdiscectomy.  Patient will be referred accordingly.  Patient will discuss the possibility of surgical intervention otherwise can come back if patient would like to consider the osteopathic manipulation but warned her that this likely will not be long-term fix.  Total time with patient going over the  MRI, different treatment options and prognosis 37 minutes.    Nonallopathic problems  Decision today to treat with OMT was based on Physical Exam  After verbal consent patient was treated with HVLA, ME, FPR techniques in thoracic, lumbar, and sacral  areas  Patient tolerated the procedure well with improvement in symptoms  Patient given exercises, stretches and lifestyle modifications  See medications in patient instructions if given  Patient will follow up in 4-8 weeks      The above documentation has been reviewed and is accurate and complete Lyndal Pulley, DO       Note: This dictation was prepared with Dragon dictation along with smaller phrase technology. Any transcriptional errors that result from this process are unintentional.

## 2019-12-01 ENCOUNTER — Encounter: Payer: Self-pay | Admitting: Family Medicine

## 2019-12-08 ENCOUNTER — Ambulatory Visit: Payer: 59 | Admitting: Orthopedic Surgery

## 2019-12-09 ENCOUNTER — Other Ambulatory Visit: Payer: Self-pay

## 2019-12-09 ENCOUNTER — Encounter: Payer: Self-pay | Admitting: Allergy & Immunology

## 2019-12-09 ENCOUNTER — Ambulatory Visit: Payer: 59 | Admitting: Allergy & Immunology

## 2019-12-09 VITALS — BP 126/82 | HR 86 | Resp 16

## 2019-12-09 DIAGNOSIS — J0100 Acute maxillary sinusitis, unspecified: Secondary | ICD-10-CM | POA: Diagnosis not present

## 2019-12-09 DIAGNOSIS — J3089 Other allergic rhinitis: Secondary | ICD-10-CM | POA: Diagnosis not present

## 2019-12-09 DIAGNOSIS — J302 Other seasonal allergic rhinitis: Secondary | ICD-10-CM | POA: Diagnosis not present

## 2019-12-09 MED ORDER — AZITHROMYCIN 250 MG PO TABS: ORAL_TABLET | ORAL | 0 refills | Status: DC

## 2019-12-09 NOTE — Patient Instructions (Signed)
1. Seasonal and perennial allergic rhinitis - with overlying sinusitis - Start azithromycin 500 mg on the first day and 250 mg daily for the next 4 days. - Prednisone pack provided to start in case things worsen. - We will continue with the current regimen:   - Claritin (loratadine) 10 mg in the morning and Xyzal (levocetirizine) at night.  - Astelin two sprays per nostril up to twice daily daily.  - Add on nasal ipratropium one spray per nostril up to three times daily at the first sign of increased mucous production.  - Add on Sudafed on travel days (especially when you anticipate pressure changes, such as air travel)  - Consider changing antihistamines every few months (Allegra, Zyrtec, Xyzal).  2. Chronic urticaria -Continue with the antihistamines as above.  3. Follow up in six months or earlier if needed.

## 2019-12-09 NOTE — Progress Notes (Signed)
FOLLOW UP  Date of Service/Encounter:  12/09/19   Assessment:   Seasonal and perennial allergic rhinitis(weeds, grasses, indoor molds, dust mites, cat, dog and cockroach)- withoverlying acute sinusitis  Non-compliance with medication regimen  Plan/Recommendations:   1. Seasonal and perennial allergic rhinitis - with overlying sinusitis - Start azithromycin 500 mg on the first day and 250 mg daily for the next 4 days. - Prednisone pack provided to start in case things worsen. - We will continue with the current regimen:   - Claritin (loratadine) 10 mg in the morning and Xyzal (levocetirizine) at night.  - Astelin two sprays per nostril up to twice daily daily.  - Add on nasal ipratropium one spray per nostril up to three times daily at the first sign of increased mucous production.  - Add on Sudafed on travel days (especially when you anticipate pressure changes, such as air travel)  - Consider changing antihistamines every few months (Allegra, Zyrtec, Xyzal).  2. Chronic urticaria -Continue with the antihistamines as above.  3. Follow up in six months or earlier if needed.   Subjective:   Christina [name] is a 59 y.o. female presenting today for follow up of  Chief Complaint  Patient presents with  . Allergic Rhinitis     Christina [name] has a history of the following: Patient Active Problem List   Diagnosis Date Noted  . Patellofemoral arthritis of left knee 03/10/2019  . Acute medial meniscus tear of left knee 11/19/2018  . Sprain of medial collateral ligament of left knee 09/01/2018  . Neck strain, initial encounter 08/28/2018  . Degenerative disc disease, cervical 08/28/2018  . Left lumbar radiculopathy 03/05/2018  . Right lateral epicondylitis 10/30/2017  . SI (sacroiliac) joint dysfunction 10/30/2017  . Nonallopathic lesion of sacral region 10/30/2017  . Nonallopathic lesion of lumbar region 10/30/2017  . Nonallopathic lesion of thoracic region 10/30/2017  .  History of recurrent UTIs 04/11/2017  . 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 Allergic rhinitis 10/29/2016    History obtained from: chart review and patient.  Christina [name] is a 59 y.o. female presenting for a follow up visit.  She was last seen in August 2021.  At that time, we started her on systemic prednisone due to worsening allergic rhinitis symptoms.  We sent in a new triamcinolone for her to use as well since she had some poison ivy.  We otherwise continue with her nasal regimen.  In the interim, she has had around 7 days of worsening sinus pressure with postnasal drip.  She actually had some Bactrim at home that she had on hand for treatment of UTIs, which she tried for a few days without much improvement.  Evidently, her daughter is also on prednisone and she started taking that as well.  This combination did not seem to do the trick.  She realizes that she is not supposed to take medications prescribed to canines.  She reports that her symptoms have been ongoing for a period of 7 days. She has not had a fever.   Otherwise, there have been no changes to her past medical history, surgical history, family history, or social history.    Review of Systems  Constitutional: Negative.  Negative for fever, malaise/fatigue and weight loss.  HENT: Positive for congestion and sinus pain. Negative for ear discharge and ear pain.   Eyes: Negative for pain, discharge and redness.  Respiratory: Negative for cough, sputum production, shortness of breath and wheezing.   Cardiovascular: Negative.  Negative  for chest pain and palpitations.  Gastrointestinal: Negative for abdominal pain, diarrhea, heartburn, nausea and vomiting.  Skin: Negative.  Negative for itching and rash.  Neurological: Negative for dizziness and headaches.  Endo/Heme/Allergies: Negative for environmental allergies. Does not bruise/bleed easily.       Objective:   Blood pressure 126/82, pulse 86, resp. rate 16, SpO2 98  %. There is no height or weight on file to calculate BMI.   Physical Exam:  Physical Exam Constitutional:      Appearance: She is well-developed.     Comments: Very pleasant.  Talkative.  HENT:     Head: Normocephalic and atraumatic.     Right Ear: Tympanic membrane, ear canal and external ear normal.     Left Ear: Tympanic membrane, ear canal and external ear normal.     Nose: No nasal deformity, septal deviation, nasal tenderness, mucosal edema, congestion or rhinorrhea.     Right Turbinates: Enlarged and swollen.     Left Turbinates: Enlarged and swollen.     Right Sinus: No maxillary sinus tenderness or frontal sinus tenderness.     Left Sinus: No maxillary sinus tenderness or frontal sinus tenderness.     Comments: She does have a lot of rhinorrhea from the bilateral nares. She does have some sinus pressure present.     Mouth/Throat:     Mouth: Mucous membranes are not pale and not dry.     Pharynx: Uvula midline.  Eyes:     General:        Right eye: No discharge.        Left eye: No discharge.     Conjunctiva/sclera: Conjunctivae normal.     Right eye: Right conjunctiva is not injected. No chemosis.    Left eye: Left conjunctiva is not injected. No chemosis.    Pupils: Pupils are equal, round, and reactive to light.  Cardiovascular:     Rate and Rhythm: Normal rate and regular rhythm.     Heart sounds: Normal heart sounds.  Pulmonary:     Effort: Pulmonary effort is normal. No tachypnea, accessory muscle usage or respiratory distress.     Breath sounds: Normal breath sounds. No wheezing, rhonchi or rales.     Comments: Moving air well in all lung fields. No increased work of breathing noted.  Chest:     Chest wall: No tenderness.  Lymphadenopathy:     Cervical: No cervical adenopathy.  Skin:    Coloration: Skin is not pale.     Findings: No abrasion, erythema, petechiae or rash. Rash is not papular, urticarial or vesicular.  Neurological:     Mental Status: She is  alert.  Psychiatric:        Behavior: Behavior is cooperative.      Diagnostic studies: none      Salvatore Marvel, MD  [facility]

## 2019-12-29 NOTE — Progress Notes (Signed)
[facility] [address] [place] Phone: [phone] Subjective:   I Kandace Blitz am serving as a Education administrator for Dr. Hulan Saas.  This visit occurred during the SARS-CoV-2 public health emergency.  Safety protocols were in place, including screening questions prior to the visit, additional usage of staff PPE, and extensive cleaning of exam room while observing appropriate contact time as indicated for disinfecting solutions.   I'm seeing this patient by the request  of:  Panosh, Standley Brooking, MD  CC: Bilateral knee pain  ZLD:[id number]   11/30/2019 Patient does have more of a left-sided L5 nerve root impingement.  Continuing to have difficulty.  No longer responding to the nerve root or epidural injections.  Patient has failed all other conservative therapy at this time.  Patient could be a potential candidate for microdiscectomy.  Patient will be referred accordingly.  Patient will discuss the possibility of surgical intervention otherwise can come back if patient would like to consider the osteopathic manipulation but warned her that this likely will not be long-term fix.  Total time with patient going over the MRI, different treatment options and prognosis 37 minutes.  Mild overall, patient will start with formal physical therapy.  Discussed avoiding lifting things overhead.  Update 12/31/2019 Margo Lama Betke is a 59 y.o. female coming in with complaint of the knee pain. Patient referred to Dr. Lynann Bologna for back pain.  Scheduled for radiofrequency ablation patient states the right knee is really bad. Would like injections bilaterally. Having issues with kneeling.  Patient has had knee x-ray showing the patient had moderate osteoarthritic changes.  MRI of the left knee also showed severe full-thickness arthritic changes of the patellofemoral joint. Patient states that it is affecting daily activities.  And sometimes even bad enough to affect how she goes  up and down stairs as well as unable to wear heels secondary to the discomfort and pain.      Past Medical History:  Diagnosis Date  . Arthritis   . Chicken pox   . Recurrent upper respiratory infection (URI)    Past Surgical History:  Procedure Laterality Date  . BLADDER SUSPENSION    . CHOLECYSTECTOMY     Social History   Socioeconomic History  . Marital status: Married    Spouse name: Not on file  . Number of children: Not on file  . Years of education: Not on file  . Highest education level: Not on file  Occupational History  . Occupation: retired  Tobacco Use  . Smoking status: Former Research scientist (life sciences)  . Smokeless tobacco: Never Used  Vaping Use  . Vaping Use: Never used  Substance and Sexual Activity  . Alcohol use: Yes  . Drug use: No  . Sexual activity: Yes    Partners: Male  Other Topics Concern  . Not on file  Social History Narrative  . Not on file   Social Determinants of Health   Financial Resource Strain:   . Difficulty of Paying Living Expenses: Not on file  Food Insecurity:   . Worried About Charity fundraiser in the Last Year: Not on file  . Ran Out of Food in the Last Year: Not on file  Transportation Needs:   . Lack of Transportation (Medical): Not on file  . Lack of Transportation (Non-Medical): Not on file  Physical Activity:   . Days of Exercise per Week: Not on file  . Minutes of Exercise per Session: Not on file  Stress:   . Feeling of Stress : Not on file  Social Connections:   . Frequency of Communication with Friends and Family: Not on file  . Frequency of Social Gatherings with Friends and Family: Not on file  . Attends Religious Services: Not on file  . Active Member of Clubs or Organizations: Not on file  . Attends Archivist Meetings: Not on file  . Marital Status: Not on file   No Known Allergies Family History  Problem Relation Age of Onset  . Cancer Mother   . Hyperlipidemia Mother   . Hypertension Mother   .  Stroke Mother   . Breast cancer Mother   . Cancer Father   . Prostate cancer Father   . Hypertension Maternal Grandmother   . Hypertension Paternal Grandmother   . Stroke Paternal Grandmother   . Allergic rhinitis Neg Hx   . Asthma Neg Hx     Current Outpatient Medications (Endocrine & Metabolic):  .  predniSONE (DELTASONE) 10 MG tablet, Take 3 tabs (30mg ) twice daily for 3 days, then 2 tabs (20mg ) twice daily for 3 days, then 1 tab (10mg ) twice daily for 3 days, then STOP.  Current Outpatient Medications (Cardiovascular):  .  losartan (COZAAR) 50 MG tablet, TAKE 1 TABLET(50 MG) BY MOUTH DAILY  Current Outpatient Medications (Respiratory):  .  azelastine (ASTELIN) 0.1 % nasal spray, Place 2 sprays into both nostrils 2 (two) times daily as needed for rhinitis. Use in each nostril as directed .  cetirizine (ZYRTEC) 10 MG tablet, 1-2 tablets daily, prn .  levocetirizine (XYZAL) 5 MG tablet, Take 1 tablet (5 mg total) by mouth every evening.    Current Outpatient Medications (Other):  .  azithromycin (ZITHROMAX) 250 MG tablet, Take two tabs on day 1 and then one tab daily for four more days. .  Cholecalciferol (VITAMIN D PO), Take 1 tablet by mouth daily. Marland Kitchen  oxybutynin (DITROPAN XL) 15 MG 24 hr tablet, Take by mouth. .  Probiotic Product (PROBIOTIC PO), Take 1 tablet by mouth daily as needed. .  sulfamethoxazole-trimethoprim (BACTRIM DS) 800-160 MG tablet, Take 1 po prn for UTI prevention .  tiZANidine (ZANAFLEX) 4 MG tablet, TAKE 1 TABLET(4 MG) BY MOUTH AT BEDTIME .  triamcinolone ointment (KENALOG) 0.1 %, Apply 1 application topically 2 (two) times daily.   Reviewed prior external information including notes and imaging from  primary care provider As well as notes that were available from care everywhere and other healthcare systems.  Past medical history, social, surgical and family history all reviewed in electronic medical record.  No pertanent information unless stated  regarding to the chief complaint.   Review of Systems:  No headache, visual changes, nausea, vomiting, diarrhea, constipation, dizziness, abdominal pain, skin rash, fevers, chills, night sweats, weight loss, swollen lymph nodes, body aches, joint swelling, chest pain, shortness of breath, mood changes. POSITIVE muscle aches  Objective  Blood pressure 130/90, pulse 72, height 5\' 1"  (1.549 m), weight 145 lb (65.8 kg), SpO2 98 %.   General: No apparent distress alert and oriented x3 mood and affect normal, dressed appropriately.  HEENT: Pupils equal, extraocular movements intact  Respiratory: Patient's speak in full sentences and does not appear short of breath  Cardiovascular: No lower extremity edema, non tender, no erythema  Knee: Bilateral valgus deformity noted. Large thigh to calf ratio.  Trace effusion noted Tender to palpation over medial and PF joint line.  ROM full in flexion and extension and lower leg  rotation. Mild instability with valgus force.  painful patellar compression. Patellar glide with moderate crepitus. Patellar and quadriceps tendons unremarkable. Hamstring and quadriceps strength is normal.  After informed written and verbal consent, patient was seated on exam table. Right knee was prepped with alcohol swab and utilizing anterolateral approach, patient's right knee space was injected with 4:1  marcaine 0.5%: Kenalog 40mg /dL. Patient tolerated the procedure well without immediate complications.  After informed written and verbal consent, patient was seated on exam table. Left knee was prepped with alcohol swab and utilizing anterolateral approach, patient's left knee space was injected with 4:1  marcaine 0.5%: Kenalog 40mg /dL. Patient tolerated the procedure well without immediate complications.    Impression and Recommendations:     The above documentation has been reviewed and is accurate and complete Lyndal Pulley, DO

## 2019-12-31 ENCOUNTER — Ambulatory Visit: Payer: 59 | Admitting: Family Medicine

## 2019-12-31 ENCOUNTER — Encounter: Payer: Self-pay | Admitting: Family Medicine

## 2019-12-31 ENCOUNTER — Other Ambulatory Visit: Payer: Self-pay

## 2019-12-31 DIAGNOSIS — M5416 Radiculopathy, lumbar region: Secondary | ICD-10-CM

## 2019-12-31 DIAGNOSIS — M170 Bilateral primary osteoarthritis of knee: Secondary | ICD-10-CM

## 2019-12-31 NOTE — Assessment & Plan Note (Signed)
Following up with Ortho surgery and PM&R

## 2019-12-31 NOTE — Assessment & Plan Note (Signed)
Bilateral injections given today, tolerated the procedure well, discussed icing regimen and home exercises.  Patient could be a candidate for viscosupplementation.  Patient had get approval previously and we will see if we need another one.  Discussed bracing and topical anti-inflammatories.  Follow-up with me again 8 weeks

## 2019-12-31 NOTE — Patient Instructions (Addendum)
Good to see you Injected both knees today Will check on gel injections Continue the exercises Hope the back gets better See me again in 8 weeks

## 2020-01-07 ENCOUNTER — Encounter: Payer: Self-pay | Admitting: Internal Medicine

## 2020-01-07 ENCOUNTER — Telehealth: Payer: 59 | Admitting: Internal Medicine

## 2020-01-07 VITALS — Temp 98.1°F

## 2020-01-07 DIAGNOSIS — J329 Chronic sinusitis, unspecified: Secondary | ICD-10-CM

## 2020-01-07 DIAGNOSIS — J3089 Other allergic rhinitis: Secondary | ICD-10-CM

## 2020-01-07 MED ORDER — AMOXICILLIN-POT CLAVULANATE 875-125 MG PO TABS: 1.0000 | ORAL_TABLET | Freq: Two times a day (BID) | ORAL | 0 refills | Status: DC

## 2020-01-07 NOTE — Progress Notes (Signed)
Virtual Visit via Video Note  I connected with@ on 01/07/20 at  2:30 PM EST by a video enabled telemedicine application and verified that I am speaking with the correct person using two identifiers. Location patient: home Location provider:work  office Persons participating in the virtual visit: patient, provider  [place] national recommendations  regarding COVID 19 pandemic   video visit is advised over in office visit for this patient.  Patient aware  of the limitations of evaluation and management by telemedicine and  availability of in person appointments. and agreed to proceed.   HPI: Christina [name] presents for video visit Cold scratchy throat   Husband firs t sick  And then  onset like a cold  And often goes in to sinus infection   Now  2 weeks  nad not improving some frontal headache   No teeth pain  Every one has been sick but  Neg covid home testing  Sleeping propped up at night to breath through nose  Sees allergist   And  Usually  Sees them ofr Allergies  nd gets them usually but not available today  Worse in am and nothing  Nasal spray and nose spray and drainage .   Was able to sleep  More last night   Pressure .   Last infection rx w z pack and may never totally resolved ( allergist advised augmentin)   ROS: See pertinent positives and negatives per HPI.  Past Medical History:  Diagnosis Date  . Arthritis   . Chicken pox   . Recurrent upper respiratory infection (URI)     Past Surgical History:  Procedure Laterality Date  . BLADDER SUSPENSION    . CHOLECYSTECTOMY      Family History  Problem Relation Age of Onset  . Cancer Mother   . Hyperlipidemia Mother   . Hypertension Mother   . Stroke Mother   . Breast cancer Mother   . Cancer Father   . Prostate cancer Father   . Hypertension Maternal Grandmother   . Hypertension Paternal Grandmother   . Stroke Paternal Grandmother   . Allergic rhinitis Neg Hx   . Asthma Neg Hx     Social History   Tobacco Use   . Smoking status: Former Research scientist (life sciences)  . Smokeless tobacco: Never Used  Vaping Use  . Vaping Use: Never used  Substance Use Topics  . Alcohol use: Yes  . Drug use: No      Current Outpatient Medications:  .  aspirin 325 MG EC tablet, Take 325 mg by mouth daily., Disp: , Rfl:  .  azelastine (ASTELIN) 0.1 % nasal spray, Place 2 sprays into both nostrils 2 (two) times daily as needed for rhinitis. Use in each nostril as directed, Disp: 30 mL, Rfl: 5 .  cetirizine (ZYRTEC) 10 MG tablet, 1-2 tablets daily, prn, Disp: 60 tablet, Rfl: 5 .  Cholecalciferol (VITAMIN D PO), Take 1 tablet by mouth daily., Disp: , Rfl:  .  levocetirizine (XYZAL) 5 MG tablet, Take 1 tablet (5 mg total) by mouth every evening., Disp: 30 tablet, Rfl: 5 .  losartan (COZAAR) 50 MG tablet, TAKE 1 TABLET(50 MG) BY MOUTH DAILY, Disp: 90 tablet, Rfl: 3 .  oxybutynin (DITROPAN XL) 15 MG 24 hr tablet, Take by mouth., Disp: , Rfl:  .  Probiotic Product (PROBIOTIC PO), Take 1 tablet by mouth daily as needed., Disp: , Rfl:  .  amoxicillin-clavulanate (AUGMENTIN) 875-125 MG tablet, Take 1 tablet by mouth every 12 (twelve)  hours. For sinusitis, Disp: 20 tablet, Rfl: 0 .  predniSONE (DELTASONE) 10 MG tablet, Take 3 tabs (30mg ) twice daily for 3 days, then 2 tabs (20mg ) twice daily for 3 days, then 1 tab (10mg ) twice daily for 3 days, then STOP. (Patient not taking: Reported on 01/07/2020), Disp: 36 tablet, Rfl: 0 .  tiZANidine (ZANAFLEX) 4 MG tablet, TAKE 1 TABLET(4 MG) BY MOUTH AT BEDTIME (Patient not taking: Reported on 01/07/2020), Disp: 30 tablet, Rfl: 0 .  triamcinolone ointment (KENALOG) 0.1 %, Apply 1 application topically 2 (two) times daily. (Patient not taking: Reported on 01/07/2020), Disp: 80 g, Rfl: 3  EXAM: BP Readings from Last 3 Encounters:  12/31/19 130/90  12/09/19 126/82  11/30/19 130/82    VITALS per patient if applicable: mild congestion non toxic  l ooking and nl respiration   GENERAL: alert, oriented, appears  well and in no acute distress  HEENT: atraumatic, conjunttiva clear, no obvious abnormalities on inspection of external nose and ears  NECK: normal movements of the head and neck  LUNGS: on inspection no signs of respiratory distress, breathing rate appears normal, no obvious gross SOB, gasping or wheezing  CV: no obvious cyanosis  PSYCH/NEURO: pleasant and cooperative, no obvious depression or anxiety, speech and thought processing grossly intact Lab Results  Component Value Date   WBC 6.7 12/09/2018   HGB 13.2 12/09/2018   HCT 39.7 12/09/2018   PLT 373.0 12/09/2018   GLUCOSE 103 (H) 05/18/2019   CHOL 228 (H) 05/18/2019   TRIG 64.0 05/18/2019   HDL 74.70 05/18/2019   LDLCALC 140 (H) 05/18/2019   ALT 20 05/18/2019   AST 20 05/18/2019   NA 139 05/18/2019   K 4.2 05/18/2019   CL 103 05/18/2019   CREATININE 0.78 05/18/2019   BUN 14 05/18/2019   CO2 32 05/18/2019   TSH 1.10 12/09/2018   HGBA1C 5.7 05/18/2019    ASSESSMENT AND PLAN:  Discussed the following assessment and plan:    ICD-10-CM   1. Recurrent sinus infections  J32.9   2. Environmental and seasonal allergies  J30.89     Counseled.  Continue sinus hygiene and  meds allergy add antibiotic for 7-10 days     Expectant management and discussion of plan and treatment with opportunity to ask questions and all were answered. The patient agreed with the plan and demonstrated an understanding of the instructions.   Advised to call back or seek an in-person evaluation if worsening  or having  further concerns . Return if symptoms worsen or fail to improve as expected.    Shanon Ace, MD

## 2020-01-24 ENCOUNTER — Ambulatory Visit: Payer: 59 | Admitting: Orthopedic Surgery

## 2020-01-24 DIAGNOSIS — M1711 Unilateral primary osteoarthritis, right knee: Secondary | ICD-10-CM

## 2020-01-24 DIAGNOSIS — M1712 Unilateral primary osteoarthritis, left knee: Secondary | ICD-10-CM | POA: Diagnosis not present

## 2020-01-26 ENCOUNTER — Ambulatory Visit: Payer: 59 | Admitting: Orthopedic Surgery

## 2020-01-30 ENCOUNTER — Encounter: Payer: Self-pay | Admitting: Orthopedic Surgery

## 2020-01-30 NOTE — Progress Notes (Signed)
Office Visit Note   Patient: Christina [name]           Date of Birth: 03/13/1960           MRN: [id number] Visit Date: 01/24/2020 Requested by: Burnis Medin, MD [place],  [place] [id number] PCP: Burnis Medin, MD  Subjective: Chief Complaint  Patient presents with  . Left Knee - Pain    Synvisc One injection  . Right Knee - Pain    Synvisc One Injection    HPI: Christina [name] is a 59 y.o. female who presents to the office complaining of bilateral knee pain.  She has a history of bilateral knee osteoarthritis.  She presents today for bilateral knee Synvisc One injections.  She denies any significant changes since her prior office visit.  No mechanical locking symptoms or instability of the knees..                ROS: All systems reviewed are negative as they relate to the chief complaint within the history of present illness.  Patient denies fevers or chills.  Assessment & Plan: Visit Diagnoses:  1. Unilateral primary osteoarthritis, right knee   2. Unilateral primary osteoarthritis, left knee     Plan: Patient is a 59 year old female presents for bilateral knee Synvisc injections today.  She tolerated the injections well.  She has a history of bilateral knee osteoarthritis.  Last set of cortisone injections were on 04/12/2019 and provided good long-lasting relief.  Injections were off several months ago but overall she is tolerating her pain well and not considering surgical intervention at this time.  Plan to follow-up as needed.  Follow-Up Instructions: No follow-ups on file.   Orders:  No orders of the defined types were placed in this encounter.  No orders of the defined types were placed in this encounter.     Procedures: Large Joint Inj: bilateral knee on 01/31/2020 8:56 AM Indications: pain, joint swelling and diagnostic evaluation Details: 18 G 1.5 in needle, superolateral approach  Arthrogram: No  Medications (Right): 5 mL lidocaine 1 %; 48 mg  Hylan 48 MG/6ML Medications (Left): 5 mL lidocaine 1 %; 48 mg Hylan 48 MG/6ML Outcome: tolerated well, no immediate complications Procedure, treatment alternatives, risks and benefits explained, specific risks discussed. Consent was given by the patient. Immediately prior to procedure a time out was called to verify the correct patient, procedure, equipment, support staff and site/side marked as required. Patient was prepped and draped in the usual sterile fashion.       Clinical Data: No additional findings.  Objective: Vital Signs: There were no vitals taken for this visit.  Physical Exam:  Constitutional: Patient appears well-developed HEENT:  Head: Normocephalic Eyes:EOM are normal Neck: Normal range of motion Cardiovascular: Normal rate Pulmonary/chest: Effort normal Neurologic: Patient is alert Skin: Skin is warm Psychiatric: Patient has normal mood and affect  Ortho Exam: Ortho exam demonstrates bilateral knees with intact extensor mechanisms.  Left knee with no effusion.  Right knee with small effusion.  Mild tenderness over the medial lateral joint lines.  No pain with hip range of motion bilaterally.  No calf tenderness.  Able to perform straight leg raise.  Specialty Comments:  No specialty comments available.  Imaging: No results found.   PMFS History: Patient Active Problem List   Diagnosis Date Noted  . Degenerative arthritis of knee, bilateral 12/31/2019  . Patellofemoral arthritis of left knee 03/10/2019  . Acute medial meniscus tear  of left knee 11/19/2018  . Sprain of medial collateral ligament of left knee 09/01/2018  . Neck strain, initial encounter 08/28/2018  . Degenerative disc disease, cervical 08/28/2018  . Left lumbar radiculopathy 03/05/2018  . Right lateral epicondylitis 10/30/2017  . SI (sacroiliac) joint dysfunction 10/30/2017  . Nonallopathic lesion of sacral region 10/30/2017  . Nonallopathic lesion of lumbar region 10/30/2017  .  Nonallopathic lesion of thoracic region 10/30/2017  . History of recurrent UTIs 04/11/2017  . 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 Allergic rhinitis 10/29/2016   Past Medical History:  Diagnosis Date  . Arthritis   . Chicken pox   . Recurrent upper respiratory infection (URI)     Family History  Problem Relation Age of Onset  . Cancer Mother   . Hyperlipidemia Mother   . Hypertension Mother   . Stroke Mother   . Breast cancer Mother   . Cancer Father   . Prostate cancer Father   . Hypertension Maternal Grandmother   . Hypertension Paternal Grandmother   . Stroke Paternal Grandmother   . Allergic rhinitis Neg Hx   . Asthma Neg Hx     Past Surgical History:  Procedure Laterality Date  . BLADDER SUSPENSION    . CHOLECYSTECTOMY     Social History   Occupational History  . Occupation: retired  Tobacco Use  . Smoking status: Former Research scientist (life sciences)  . Smokeless tobacco: Never Used  Vaping Use  . Vaping Use: Never used  Substance and Sexual Activity  . Alcohol use: Yes  . Drug use: No  . Sexual activity: Yes    Partners: Male

## 2020-01-31 ENCOUNTER — Encounter: Payer: Self-pay | Admitting: Family Medicine

## 2020-01-31 DIAGNOSIS — M1712 Unilateral primary osteoarthritis, left knee: Secondary | ICD-10-CM | POA: Diagnosis not present

## 2020-01-31 DIAGNOSIS — M1711 Unilateral primary osteoarthritis, right knee: Secondary | ICD-10-CM

## 2020-01-31 MED ORDER — LIDOCAINE HCL 1 % IJ SOLN
5.0000 mL | INTRAMUSCULAR | Status: AC | PRN
  Administered 2020-01-31: 5 mL

## 2020-01-31 MED ORDER — HYLAN G-F 20 48 MG/6ML IX SOSY
48.0000 mg | PREFILLED_SYRINGE | INTRA_ARTICULAR | Status: AC | PRN
  Administered 2020-01-31: 48 mg via INTRA_ARTICULAR

## 2020-02-01 ENCOUNTER — Ambulatory Visit: Payer: 59 | Admitting: Family Medicine

## 2020-02-01 MED ORDER — PREDNISONE 20 MG PO TABS: 20.0000 mg | ORAL_TABLET | Freq: Every day | ORAL | 0 refills | Status: DC

## 2020-02-14 NOTE — Progress Notes (Signed)
Christina Christina [name] Phone: [phone] Subjective:   Christina Christina [name], am serving as a scribe for Christina Christina [name]. This visit occurred during the SARS-CoV-2 public health emergency.  Safety protocols were in place, including screening questions prior to the visit, additional usage of staff PPE, and extensive cleaning of exam room while observing appropriate contact time as indicated for disinfecting solutions.   I'm seeing this patient by the request  of:  Panosh, Standley Brooking, MD  CC: back pain and knee pain follow up up   PYK:[id number]  Christina Christina [name] is a 59 y.o. female coming in with complaint of back and neck pain. OMT 11/30/2019. Was seen by Dr. Laurena Bering PA. Had epidural which last 10 days. Unsure if she is able to get ablation through [facility] due to insurance issues. Using inversion table and using prednisone when her pain is bad. Using methocarbamelol, meloxicam and flexeril. Tramadol prn.   Complaining of right elbow pain that is getting worse especially after pickleball. Pain over lateral epicondyle.  Patient denies any radiation down the arm.  Has been wearing the brace.  Doing relatively well but has some chronic aching pain only with certain activities.   Patient states that she has had gel injections in both knees by Christina Christina [name]. Feels that she will need replacement soon but is trying to put it off.    Medications patient has been prescribed: Prednisone,  Taking: Patient has numerous questions about the medications.  Has taken methocarbamol and Flexeril very sparingly.  Does take prednisone occasionally.  Last epidural 10/13/2019 Christina Christina [name] now consider medial branch block and and is considering the possibility of ablation but having difficulty with the insurance company.  MRI of left knee 02/2019- Showed severe lateral compartment OA  Seeing Christina Christina [name] and had synvisc 12/6       Reviewed prior external  information including notes and imaging from previsou exam, outside providers and external EMR if available.   As well as notes that were available from care everywhere and other healthcare systems.  Past medical history, social, surgical and family history all reviewed in electronic medical record.  Christina [name] pertanent information unless stated regarding to the chief complaint.   Past Medical History:  Diagnosis Date  . Arthritis   . Chicken pox   . Recurrent upper respiratory infection (URI)     Christina [name] Known Allergies   Review of Systems:  Christina [name] headache, visual changes, nausea, vomiting, diarrhea, constipation, dizziness, abdominal pain, skin rash, fevers, chills, night sweats, weight loss, swollen lymph nodes,, joint swelling, chest pain, shortness of breath, mood changes. POSITIVE muscle aches, body aches  Objective  Blood pressure (!) 132/92, pulse 85, height 5\' 1"  (1.549 m), weight 143 lb (64.9 kg), SpO2 97 %.   General: Christina [name] apparent distress alert and oriented x3 mood and affect normal, dressed appropriately.  HEENT: Pupils equal, extraocular movements intact  Respiratory: Patient's speak in full sentences and does not appear short of breath  Cardiovascular: Christina [name] lower extremity edema, non tender, Christina [name] erythema  Gait normal with good balance and coordination.  MSK: Patient moves the knee well on the left side without any significant difficulty Back -patient is very comfortable sitting in the chair. Right elbow exam shows mild tenderness over the lateral epicondylitis but very minimal.  Patient has good strength of the wrist with extension with very minimal discomfort over the lateral right elbow negative Tinel's.  Good [name] strength  Assessment and Plan:        The above documentation has been reviewed and is accurate and complete Christina Pulley, DO       Note: This dictation was prepared with Dragon dictation along with smaller phrase technology. Any transcriptional errors that  result from this process are unintentional.

## 2020-02-15 ENCOUNTER — Other Ambulatory Visit: Payer: Self-pay

## 2020-02-15 ENCOUNTER — Ambulatory Visit: Payer: 59 | Admitting: Family Medicine

## 2020-02-15 ENCOUNTER — Encounter: Payer: Self-pay | Admitting: Family Medicine

## 2020-02-15 DIAGNOSIS — M7711 Lateral epicondylitis, right elbow: Secondary | ICD-10-CM

## 2020-02-15 DIAGNOSIS — M5416 Radiculopathy, lumbar region: Secondary | ICD-10-CM | POA: Diagnosis not present

## 2020-02-15 DIAGNOSIS — M170 Bilateral primary osteoarthritis of knee: Secondary | ICD-10-CM | POA: Diagnosis not present

## 2020-02-15 NOTE — Patient Instructions (Signed)
Thicker grip on racket Avoid overhand lifting Ice elbow after activity  For the ski trip, maybe start with meloxicam, if not improving then take prednisone (never take NSAIDS with prednisone) Muscle relaxer take one or the other flexeril or methocarbmalol  Keep other appts with other docs  Follow up as needed

## 2020-02-15 NOTE — Assessment & Plan Note (Signed)
Patient is still having difficulty with the radicular symptoms somewhat.  Patient is going under and possible ablation if there is approval.  Otherwise patient will need to consider the possibility of surgical intervention.  Patient has failed all other conservative therapy at this time.  He does get improvement with the nerve root injections for days at a time where she does feel 100% better and then it seems to come back.  We discussed different medications in great detail.  We discussed that when taking the prednisone she needs to not take ibuprofen or meloxicam.  We discussed trying to err on the side of taking the meloxicam when needed first.  We also discussed different muscle relaxers that patient does have.  Patient has been is prescribed cyclobenzaprine and methocarbamol.  We discussed not mixing these medications patient will talk to the people who are prescribing these medications as well.  Patient will follow up with her orthopedist and PM&R physicians from here on out and I am here for any questions.  Total time reviewing patient's chart and discussing with patient 33 minutes

## 2020-02-15 NOTE — Assessment & Plan Note (Signed)
Patient has known lateral compartment osteoarthritic changes right greater than left.  Patient knows she will need a knee replacement at some point but feels that the viscosupplementation by Dr. August Saucer seems to be doing relatively well at this time.  We discussed potentially wearing the brace when she does skiing.  Patient can follow-up with me as needed.

## 2020-02-15 NOTE — Assessment & Plan Note (Signed)
Continue lateral epicondylitis.  We discussed avoiding certain lifting mechanics.  Continuing the brace.  Can consider potential injections.  Patient will likely will do well with conservative therapy also release of her worries compared to some of her other ailments.

## 2020-02-28 ENCOUNTER — Encounter: Payer: Self-pay | Admitting: Orthopedic Surgery

## 2020-02-28 NOTE — Telephone Encounter (Signed)
I think it makes sense if you are at the point where you are having night pain rest pain pain which interferes with ADLs and he cannot really do what you want to do.  The recovery is hard for the first 3 to 4 weeks but when she could be on the most difficult part in general patient's are pretty happy with the amount of pain relief that they get.  Please call thanks

## 2020-02-28 NOTE — Telephone Encounter (Signed)
Surg to be scheduled pls calal thx

## 2020-03-08 ENCOUNTER — Ambulatory Visit: Payer: 59 | Admitting: Orthopedic Surgery

## 2020-03-08 ENCOUNTER — Other Ambulatory Visit: Payer: Self-pay

## 2020-03-08 DIAGNOSIS — M25561 Pain in right knee: Secondary | ICD-10-CM | POA: Diagnosis not present

## 2020-03-09 ENCOUNTER — Ambulatory Visit: Payer: 59 | Admitting: Orthopedic Surgery

## 2020-03-10 ENCOUNTER — Encounter: Payer: Self-pay | Admitting: Orthopedic Surgery

## 2020-03-10 NOTE — Progress Notes (Addendum)
Office Visit Note   Patient: Christina [name]           Date of Birth: 07/13/1960           MRN: [id number] Visit Date: 03/08/2020 Requested by: Burnis Medin, MD [place],  [place] [address] PCP: Burnis Medin, MD  Subjective: Chief Complaint  Patient presents with  . Other     Discuss knee surgery    HPI: Christina [name] is a 60 year old patient with bilateral knee pain. She has good and bad days. She has had no relief from injections. The right knee is now waking her from sleep at night. Describes weakness and giving way. She has had a left knee MRI scan which shows medial compartment moderate arthritis and fairly severe patellofemoral arthritis. No cardiac history no kidney disease. No personal or family history of DVT or pulmonary embolism. Patient is having nerve ablation performed tomorrow due to left leg radicular pain radiating into the left foot. Denies any right-sided symptoms. She does report primarily medial sided knee pain bilaterally. She is active and plays tennis and pickleball. She also likes to ski. She states that the right knee is keeping her up at night. Left knee is more symptomatic with activity.              ROS: All systems reviewed are negative as they relate to the chief complaint within the history of present illness.  Patient denies  fevers or chills.   Assessment & Plan: Visit Diagnoses:  1. Right knee pain, unspecified chronicity     Plan: Impression is bilateral knee pain with left knee MRI scan which showed significantly more arthritis than was visible on plain radiographs. She is having some night pain with the right knee. She would be potentially considering bilateral knee replacement. I would like to make sure that there is no arthroscopically treatable pathology in the right knee before proceeding with that. She has failed conservative management including therapy exercises injections and over-the-counter medications for much longer than 6  weeks. I will see her back after her right knee MRI scan to potentially evaluate meniscal pathology and occult arthritis.  Note is made that the MRI scan is reviewed and it does show severe patellofemoral compartment arthritis as well as mild to moderate medial and lateral compartment arthritis.  Review of the MRI scan in the sagittal plane does demonstrate bone-on-bone arthritis in the medial compartment over about 30% of the weightbearing surface area.  Follow-Up Instructions: No follow-ups on file.   Orders:  Orders Placed This Encounter  Procedures  . MR Knee Right w/o contrast   No orders of the defined types were placed in this encounter.     Procedures: No procedures performed   Clinical Data: No additional findings.  Objective: Vital Signs: There were no vitals taken for this visit.  Physical Exam:   Constitutional: Patient appears well-developed HEENT:  Head: Normocephalic Eyes:EOM are normal Neck: Normal range of motion Cardiovascular: Normal rate Pulmonary/chest: Effort normal Neurologic: Patient is alert Skin: Skin is warm Psychiatric: Patient has normal mood and affect    Ortho Exam: Ortho exam demonstrates full range of motion of both knees. More patellofemoral crepitus on the left than the right. No effusion bilaterally. Collateral crucial ligaments are stable. Muscle tone in the legs is good. No groin pain with internal extra rotation of the leg. No other masses lymphadenopathy or skin changes noted in that knee region. Gait is normal. Pedal pulses palpable  with ankle dorsiflexion intact bilaterally  Specialty Comments:  No specialty comments available.  Imaging: No results found.   PMFS History: Patient Active Problem List   Diagnosis Date Noted  . Degenerative arthritis of knee, bilateral 12/31/2019  . Patellofemoral arthritis of left knee 03/10/2019  . Acute medial meniscus tear of left knee 11/19/2018  . Sprain of medial collateral ligament  of left knee 09/01/2018  . Neck strain, initial encounter 08/28/2018  . Degenerative disc disease, cervical 08/28/2018  . Left lumbar radiculopathy 03/05/2018  . Right lateral epicondylitis 10/30/2017  . SI (sacroiliac) joint dysfunction 10/30/2017  . Nonallopathic lesion of sacral region 10/30/2017  . Nonallopathic lesion of lumbar region 10/30/2017  . Nonallopathic lesion of thoracic region 10/30/2017  . History of recurrent UTIs 04/11/2017  . 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 Allergic rhinitis 10/29/2016   Past Medical History:  Diagnosis Date  . Arthritis   . Chicken pox   . Recurrent upper respiratory infection (URI)     Family History  Problem Relation Age of Onset  . Cancer Mother   . Hyperlipidemia Mother   . Hypertension Mother   . Stroke Mother   . Breast cancer Mother   . Cancer Father   . Prostate cancer Father   . Hypertension Maternal Grandmother   . Hypertension Paternal Grandmother   . Stroke Paternal Grandmother   . Allergic rhinitis Neg Hx   . Asthma Neg Hx     Past Surgical History:  Procedure Laterality Date  . BLADDER SUSPENSION    . CHOLECYSTECTOMY     Social History   Occupational History  . Occupation: retired  Tobacco Use  . Smoking status: Former Research scientist (life sciences)  . Smokeless tobacco: Never Used  Vaping Use  . Vaping Use: Never used  Substance and Sexual Activity  . Alcohol use: Yes  . Drug use: No  . Sexual activity: Yes    Partners: Male

## 2020-03-11 ENCOUNTER — Encounter: Payer: Self-pay | Admitting: Orthopedic Surgery

## 2020-03-13 NOTE — Telephone Encounter (Signed)
This looks like a FYI.  We will see her after the scan.  Thanks

## 2020-03-16 ENCOUNTER — Other Ambulatory Visit: Payer: Self-pay | Admitting: Allergy & Immunology

## 2020-03-16 ENCOUNTER — Encounter: Payer: Self-pay | Admitting: Orthopedic Surgery

## 2020-03-22 ENCOUNTER — Other Ambulatory Visit: Payer: Self-pay

## 2020-03-22 ENCOUNTER — Ambulatory Visit
Admission: RE | Admit: 2020-03-22 | Discharge: 2020-03-22 | Disposition: A | Discharge status: Home / Self Care | Payer: 59 | Source: Ambulatory Visit | Attending: Orthopedic Surgery | Admitting: Orthopedic Surgery

## 2020-03-22 DIAGNOSIS — M25561 Pain in right knee: Secondary | ICD-10-CM

## 2020-03-23 ENCOUNTER — Encounter: Payer: Self-pay | Admitting: Orthopedic Surgery

## 2020-03-27 NOTE — Telephone Encounter (Signed)
So I do not see any orders in the chart last colon was normal in April 2014  So due in 2024 for repeat colon if all is well .   I do not see orders in the chart for a fit test or Cologuard.  May be your insurance sent it to you.

## 2020-03-28 NOTE — Telephone Encounter (Signed)
I do not know what that is We did not order it .   Sounds like a home collection  Stool screen .   Maybe from your insurance company?  Sometimes they do these at home fit test which are okay to do, but we are not involved in the screening  I

## 2020-03-30 ENCOUNTER — Other Ambulatory Visit: Payer: Self-pay | Admitting: Allergy & Immunology

## 2020-03-30 NOTE — Telephone Encounter (Signed)
Right knee worse overall esp in lat compt vs left - pf compts severe bilaterally  Tka safest option for global knee pain although the pf oa is by far the worst compt in both knees

## 2020-04-06 ENCOUNTER — Telehealth: Payer: Self-pay

## 2020-04-06 NOTE — Telephone Encounter (Signed)
See below

## 2020-04-06 NOTE — Telephone Encounter (Signed)
Received voicemail from rep @ [employer] stating pts right TKA has been denied by the medical director due to pt having no PT. P2P can be set up if Dr. Marlou Sa wishes by calling [phone] and giving them dates and times Dr. Marlou Sa is available. Case ref#[id number].

## 2020-04-10 NOTE — Telephone Encounter (Signed)
Yes and yes 

## 2020-04-12 ENCOUNTER — Encounter: Payer: Self-pay | Admitting: Orthopedic Surgery

## 2020-04-12 ENCOUNTER — Telehealth: Payer: Self-pay

## 2020-04-12 NOTE — Telephone Encounter (Signed)
P2P scheduled for Monday

## 2020-04-12 NOTE — Telephone Encounter (Signed)
Peer to peer was set up for Monday afternoon (04/17/20) and I gave them your cell # and triage # as back up. The doctor calling is Vernon Prey, M.D.

## 2020-04-12 NOTE — Telephone Encounter (Signed)
Holding as reminder for Monday.

## 2020-04-17 NOTE — Telephone Encounter (Signed)
Dr Marlou Sa did P2P-They advised he needed to addend last OV note and then it will need to be resubmitted and they will approve.

## 2020-04-18 NOTE — Telephone Encounter (Signed)
I called Wells Guiles @ [employer] who was the one that called me about the denial and P2P and she said to fax the  note to [phone] with ref#[id number] on cover [phone]

## 2020-04-19 ENCOUNTER — Other Ambulatory Visit: Payer: Self-pay

## 2020-04-19 NOTE — Progress Notes (Signed)
Surgical Instructions    Your procedure is scheduled on 04/25/20.  Report to [facility] Main Entrance "A" at 05:30 A.M., then check in with the Admitting office.  Call this number if you have problems the morning of surgery:  [phone]   If you have any questions prior to your surgery date call [phone]: Open Monday-Friday 8am-4pm    Remember:  Do not eat after midnight the night before your surgery  You may drink clear liquids until 04:30am the morning of your surgery.   Clear liquids allowed are: Water, Non-Citrus Juices (without pulp), Carbonated Beverages, Clear Tea, Black Coffee Only, and Gatorade  Patient Instructions  . The night before surgery:  o No food after midnight. ONLY clear liquids after midnight  . The day of surgery (if you do NOT have diabetes):  o Drink ONE (1) Pre-Surgery Clear Ensure by 4:30am. Drink in one sitting. Do not sip.   o This drink was given to you during your hospital  pre-op appointment visit. o Nothing else to drink after completing the  Pre-Surgery Clear Ensure.      Take these medicines the morning of surgery with A SIP OF WATER  cetirizine (ZYRTEC) if needed ipratropium (ATROVENT) if needed oxybutynin (DITROPAN XL) if needed   As of today, STOP taking any Aspirin (unless otherwise instructed by your surgeon) Aleve, Naproxen, Ibuprofen, Motrin, Advil, Goody's, BC's, all herbal medications, fish oil, and all vitamins.                     Do not wear jewelry, make up, or nail polish            Do not wear lotions, powders, perfumes/colognes, or deodorant.            Do not shave 48 hours prior to surgery.              Do not bring valuables to the hospital.            [facility] is not responsible for any belongings or valuables.  Do NOT Smoke (Tobacco/Vaping) or drink Alcohol 24 hours prior to your procedure If you use a CPAP at night, you may bring all equipment for your overnight stay.   Contacts, glasses, dentures or  bridgework may not be worn into surgery, please bring cases for these belongings   For patients admitted to the hospital, discharge time will be determined by your treatment team.   Patients discharged the day of surgery will not be allowed to drive home, and someone needs to stay with them for 24 hours.    Special instructions:   [facility]- Preparing For Surgery  Before surgery, you can play an important role. Because skin is not sterile, your skin needs to be as free of germs as possible. You can reduce the number of germs on your skin by washing with CHG (chlorahexidine gluconate) Soap before surgery.  CHG is an antiseptic cleaner which kills germs and bonds with the skin to continue killing germs even after washing.    Oral Hygiene is also important to reduce your risk of infection.  Remember - BRUSH YOUR TEETH THE MORNING OF SURGERY WITH YOUR REGULAR TOOTHPASTE  Please do not use if you have an allergy to CHG or antibacterial soaps. If your skin becomes reddened/irritated stop using the CHG.  Do not shave (including legs and underarms) for at least 48 hours prior to first CHG shower. It is OK to shave your face.  Please follow these instructions carefully.   1. Shower the NIGHT BEFORE SURGERY and the MORNING OF SURGERY  2. If you chose to wash your hair, wash your hair first as usual with your normal shampoo.  3. After you shampoo, rinse your hair and body thoroughly to remove the shampoo.  4. Wash Face and genitals (private parts) with your normal soap.   5.  Shower the NIGHT BEFORE SURGERY and the MORNING OF SURGERY with CHG Soap.   6. Use CHG Soap as you would any other liquid soap. You can apply CHG directly to the skin and wash gently with a scrungie or a clean washcloth.   7. Apply the CHG Soap to your body ONLY FROM THE NECK DOWN.  Do not use on open wounds or open sores. Avoid contact with your eyes, ears, mouth and genitals (private parts). Wash Face and genitals  (private parts)  with your normal soap.   8. Wash thoroughly, paying special attention to the area where your surgery will be performed.  9. Thoroughly rinse your body with warm water from the neck down.  10. DO NOT shower/wash with your normal soap after using and rinsing off the CHG Soap.  11. Pat yourself dry with a CLEAN TOWEL.  12. Wear CLEAN PAJAMAS to bed the night before surgery  13. Place CLEAN SHEETS on your bed the night before your surgery  14. DO NOT SLEEP WITH PETS.   Day of Surgery: Take a shower.  Wear Clean/Comfortable clothing the morning of surgery Do not apply any deodorants/lotions.   Remember to brush your teeth WITH YOUR REGULAR TOOTHPASTE.   Please read over the following fact sheets that you were given.

## 2020-04-20 ENCOUNTER — Encounter (HOSPITAL_COMMUNITY): Payer: Self-pay

## 2020-04-20 ENCOUNTER — Other Ambulatory Visit: Payer: Self-pay

## 2020-04-20 ENCOUNTER — Encounter (HOSPITAL_COMMUNITY)
Admission: RE | Admit: 2020-04-20 | Discharge: 2020-04-20 | Disposition: A | Discharge status: Home / Self Care | Payer: 59 | Source: Ambulatory Visit | Attending: Orthopedic Surgery | Admitting: Orthopedic Surgery

## 2020-04-20 DIAGNOSIS — I10 Essential (primary) hypertension: Secondary | ICD-10-CM | POA: Insufficient documentation

## 2020-04-20 DIAGNOSIS — Z7982 Long term (current) use of aspirin: Secondary | ICD-10-CM | POA: Diagnosis not present

## 2020-04-20 DIAGNOSIS — Z79899 Other long term (current) drug therapy: Secondary | ICD-10-CM | POA: Diagnosis not present

## 2020-04-20 DIAGNOSIS — Z01818 Encounter for other preprocedural examination: Secondary | ICD-10-CM | POA: Diagnosis present

## 2020-04-20 DIAGNOSIS — Z87891 Personal history of nicotine dependence: Secondary | ICD-10-CM | POA: Diagnosis not present

## 2020-04-20 DIAGNOSIS — M1711 Unilateral primary osteoarthritis, right knee: Secondary | ICD-10-CM | POA: Insufficient documentation

## 2020-04-20 HISTORY — DX: Essential (primary) hypertension: I10

## 2020-04-20 LAB — CBC
HCT: 42.5 % (ref 36.0–46.0)
Hemoglobin: 14.4 g/dL (ref 12.0–15.0)
MCH: 31.5 pg (ref 26.0–34.0)
MCHC: 33.9 g/dL (ref 30.0–36.0)
MCV: 93 fL (ref 80.0–100.0)
Platelets: 409 10*3/uL — ABNORMAL HIGH (ref 150–400)
RBC: 4.57 MIL/uL (ref 3.87–5.11)
RDW: 12.7 % (ref 11.5–15.5)
WBC: 7.5 10*3/uL (ref 4.0–10.5)
nRBC: 0 % (ref 0.0–0.2)

## 2020-04-20 LAB — BASIC METABOLIC PANEL
Anion gap: 9 (ref 5–15)
BUN: 12 mg/dL (ref 8–23)
CO2: 26 mmol/L (ref 22–32)
Calcium: 9.4 mg/dL (ref 8.9–10.3)
Chloride: 103 mmol/L (ref 98–111)
Creatinine, Ser: 0.77 mg/dL (ref 0.44–1.00)
GFR, Estimated: >60 mL/min (ref >60)
Glucose, Bld: 98 mg/dL (ref 70–99)
Potassium: 3.8 mmol/L (ref 3.5–5.1)
Sodium: 138 mmol/L (ref 135–145)

## 2020-04-20 LAB — SURGICAL PCR SCREEN
MRSA, PCR: NEGATIVE
Staphylococcus aureus: NEGATIVE

## 2020-04-20 LAB — URINALYSIS, ROUTINE W REFLEX MICROSCOPIC
Bilirubin Urine: NEGATIVE
Glucose, UA: NEGATIVE mg/dL
Ketones, ur: 5 mg/dL — AB
Leukocytes,Ua: NEGATIVE
Nitrite: NEGATIVE
Protein, ur: NEGATIVE mg/dL
Specific Gravity, Urine: 1.023 (ref 1.005–1.030)
pH: 7 (ref 5.0–8.0)

## 2020-04-20 NOTE — Progress Notes (Signed)
Abnormal labs have resulted. (UA). Dr. Marlou Sa and Magn[employer], C. PA-c notified via [employer].

## 2020-04-20 NOTE — Progress Notes (Signed)
PCP - Dr. Shanon Ace Cardiologist - denies  PPM/ICD - denies  Chest x-ray - N/A EKG - 04/20/2020 Stress Test - denies ECHO - denies  Cardiac Cath - denies  Sleep Study - denies CPAP - N/A  DM: denies  Blood Thinner Instructions: N/A Aspirin Instructions: per patient last dose will be today 04/20/2020  ERAS Protcol - Yes PRE-SURGERY Ensure or G2- Ensure given   COVID TEST- Scheduled for 04/21/2020. Patient verbalized understanding of self-quarantine instructions, appointment time and place.   Anesthesia review: YES, EKG  Patient denies shortness of breath, fever, cough and chest pain at PAT appointment  All instructions explained to the patient, with a verbal understanding of the material. Patient agrees to go over the instructions while at home for a better understanding. Patient also instructed to self quarantine after being tested for COVID-19. The opportunity to ask questions was provided.

## 2020-04-21 ENCOUNTER — Other Ambulatory Visit (HOSPITAL_COMMUNITY)
Admission: RE | Admit: 2020-04-21 | Discharge: 2020-04-21 | Disposition: A | Discharge status: Home / Self Care | Payer: 59 | Source: Ambulatory Visit | Attending: Orthopedic Surgery | Admitting: Orthopedic Surgery

## 2020-04-21 DIAGNOSIS — Z01812 Encounter for preprocedural laboratory examination: Secondary | ICD-10-CM | POA: Diagnosis present

## 2020-04-21 DIAGNOSIS — Z20822 Contact with and (suspected) exposure to covid-19: Secondary | ICD-10-CM | POA: Insufficient documentation

## 2020-04-21 LAB — URINE CULTURE

## 2020-04-21 LAB — SARS CORONAVIRUS 2 (TAT 6-24 HRS): SARS Coronavirus 2: NEGATIVE

## 2020-04-21 NOTE — Anesthesia Preprocedure Evaluation (Addendum)
Anesthesia Evaluation  Patient identified by MRN, date of birth, ID band Patient awake    Reviewed: Allergy & Precautions, NPO status , Patient's Chart, lab work & pertinent test results  History of Anesthesia Complications Negative for: history of anesthetic complications  Airway Mallampati: II  TM Distance: >3 FB Neck ROM: Full    Dental  (+) Missing,    Pulmonary former smoker,    Pulmonary exam normal        Cardiovascular hypertension, Pt. on medications Normal cardiovascular exam     Neuro/Psych negative neurological ROS  negative psych ROS   GI/Hepatic negative GI ROS, Neg liver ROS,   Endo/Other  negative endocrine ROS  Renal/GU negative Renal ROS  negative genitourinary   Musculoskeletal negative musculoskeletal ROS (+)   Abdominal   Peds  Hematology negative hematology ROS (+)   Anesthesia Other Findings Day of surgery medications reviewed with patient.  Reproductive/Obstetrics negative OB ROS                           Anesthesia Physical Anesthesia Plan  ASA: II  Anesthesia Plan: Spinal   Post-op Pain Management:  Regional for Post-op pain   Induction:   PONV Risk Score and Plan: 3 and Treatment may vary due to age or medical condition, Ondansetron, Propofol infusion, Dexamethasone and Midazolam  Airway Management Planned: Natural Airway and Simple Face Mask  Additional Equipment: None  Intra-op Plan:   Post-operative Plan:   Informed Consent: I have reviewed the patients History and Physical, chart, labs and discussed the procedure including the risks, benefits and alternatives for the proposed anesthesia with the patient or authorized representative who has indicated his/her understanding and acceptance.       Plan Discussed with: CRNA  Anesthesia Plan Comments: (PAT note written 04/21/2020 by Myra Gianotti, PA-C. )      Anesthesia Quick  Evaluation

## 2020-04-21 NOTE — Progress Notes (Signed)
Anesthesia Chart Review:  Case: [id number] Date/Time: 04/25/20 0715   Procedure: RIGHT TOTAL KNEE ARTHROPLASTY (Right Knee)   Anesthesia type: Spinal   Pre-op diagnosis: right knee osteoarthritis   Location: [facility] OR ROOM 04 / [place] [facility]   Surgeons: Meredith Pel, MD      DISCUSSION: Patient is a 60 year old female scheduled for the above procedure.  History includes former smoker, HTN.   04/21/20 presurgical COVID-19 test in process. Anesthesia team to evaluate on the day of surgery.   VS: BP (!) 159/92   Pulse 76   Temp 36.7 C (Oral)   Resp 18   Ht 5\' 1"  (1.549 m)   Wt 65.8 kg   SpO2 99%   BMI 27.42 kg/m     PROVIDERS: Panosh, Standley Brooking, MD is PCP    LABS: Labs reviewed: Acceptable for surgery. PAT RN routed UA results to surgeon/PA. Urine culture showed multiple species present, suggest recollection. (all labs ordered are listed, but only abnormal results are displayed)  Labs Reviewed  URINE CULTURE - Abnormal; Notable for the following components:      Result Value   Culture MULTIPLE SPECIES PRESENT, SUGGEST RECOLLECTION (*)    All other components within normal limits  CBC - Abnormal; Notable for the following components:   Platelets 409 (*)    All other components within normal limits  URINALYSIS, ROUTINE W REFLEX MICROSCOPIC - Abnormal; Notable for the following components:   APPearance CLOUDY (*)    Hgb urine dipstick SMALL (*)    Ketones, ur 5 (*)    Bacteria, UA RARE (*)    All other components within normal limits  SURGICAL PCR SCREEN  BASIC METABOLIC PANEL     IMAGES: MRI right knee 03/22/20: IMPRESSION: 1. Significant patellofemoral degenerative changes as detailed above. 2. Intact ligamentous structures and no acute bony findings. 3. No meniscal tears. 4. Small joint effusion and small Baker's cyst. Multi septated ganglion cyst in the supracondylar region posteriorly.  MRI L-spine 03/17/18: IMPRESSION: 1. No acute fracture. Bilateral L4-5 facet  edema, likely degenerative or inflammatory facet arthritis. 2. Lumbar spondylosis greatest at L4-5 were disc and facet degenerative changes result in mild foraminal and spinal canal stenosis. Disc contact on the descending left L5 nerve root in the left lateral recess.   EKG: 04/20/20: Normal sinus rhythm Possible Left atrial enlargement Nonspecific ST abnormality Abnormal ekg Confirmed by Virgina Jock, Manish ([id number]) on 04/20/2020 8:43:55 PM   CV: Denied prior stress test, echocardiogram, cardiac cath.  Past Medical History:  Diagnosis Date  . Arthritis   . Chicken pox   . Hypertension   . Recurrent upper respiratory infection (URI)     Past Surgical History:  Procedure Laterality Date  . BLADDER SUSPENSION    . CHOLECYSTECTOMY      MEDICATIONS: . aspirin 325 MG EC tablet  . cetirizine (ZYRTEC) 10 MG tablet  . Cholecalciferol (VITAMIN D) 50 MCG (2000 UT) tablet  . doxylamine, Sleep, (UNISOM) 25 MG tablet  . ibuprofen (ADVIL) 200 MG tablet  . ipratropium (ATROVENT) 0.06 % nasal spray  . levocetirizine (XYZAL) 5 MG tablet  . losartan (COZAAR) 50 MG tablet  . oxybutynin (DITROPAN XL) 15 MG 24 hr tablet  . predniSONE (DELTASONE) 20 MG tablet  . Probiotic Product (PROBIOTIC PO)   No current facility-administered medications for this encounter.  She is not currently taking prednisone (course completed).  Last ASA 04/20/20.    Myra Gianotti, PA-C Surgical Short Stay/Anesthesiology [facility] Phone [phone])  [phone] [facility] Phone [phone] 04/21/2020 2:12 PM

## 2020-04-22 ENCOUNTER — Encounter: Payer: Self-pay | Admitting: Orthopedic Surgery

## 2020-04-24 MED ORDER — TRANEXAMIC ACID 1000 MG/10ML IV SOLN
2000.0000 mg | INTRAVENOUS | Status: AC
  Administered 2020-04-25: 2000 mg via TOPICAL
  Filled 2020-04-24: qty 20

## 2020-04-24 NOTE — Telephone Encounter (Signed)
Is she approved for gel injection?

## 2020-04-25 ENCOUNTER — Encounter (HOSPITAL_COMMUNITY): Payer: Self-pay | Admitting: Orthopedic Surgery

## 2020-04-25 ENCOUNTER — Ambulatory Visit (HOSPITAL_COMMUNITY): Payer: 59 | Admitting: Vascular Surgery

## 2020-04-25 ENCOUNTER — Encounter (HOSPITAL_COMMUNITY)
Admission: RE | Disposition: A | Discharge status: Home / Self Care | Payer: Self-pay | Source: Home / Self Care | Attending: Orthopedic Surgery

## 2020-04-25 ENCOUNTER — Ambulatory Visit (HOSPITAL_COMMUNITY): Payer: 59 | Admitting: Certified Registered Nurse Anesthetist

## 2020-04-25 ENCOUNTER — Observation Stay (HOSPITAL_COMMUNITY)
Admission: RE | Admit: 2020-04-25 | Discharge: 2020-04-26 | Disposition: A | Discharge status: Home / Self Care | Payer: 59 | Attending: Orthopedic Surgery | Admitting: Orthopedic Surgery

## 2020-04-25 ENCOUNTER — Other Ambulatory Visit: Payer: Self-pay

## 2020-04-25 DIAGNOSIS — Z79899 Other long term (current) drug therapy: Secondary | ICD-10-CM | POA: Diagnosis not present

## 2020-04-25 DIAGNOSIS — Z96651 Presence of right artificial knee joint: Secondary | ICD-10-CM

## 2020-04-25 DIAGNOSIS — Z87891 Personal history of nicotine dependence: Secondary | ICD-10-CM | POA: Insufficient documentation

## 2020-04-25 DIAGNOSIS — I10 Essential (primary) hypertension: Secondary | ICD-10-CM | POA: Diagnosis not present

## 2020-04-25 DIAGNOSIS — M1711 Unilateral primary osteoarthritis, right knee: Principal | ICD-10-CM | POA: Insufficient documentation

## 2020-04-25 HISTORY — DX: Hyperlipidemia, unspecified: E78.5

## 2020-04-25 HISTORY — PX: TOTAL KNEE ARTHROPLASTY: SHX125

## 2020-04-25 HISTORY — DX: Other seasonal allergic rhinitis: J30.2

## 2020-04-25 SURGERY — ARTHROPLASTY, KNEE, TOTAL
Anesthesia: Monitor Anesthesia Care | Site: Knee | Laterality: Right

## 2020-04-25 MED ORDER — PHENOL 1.4 % MT LIQD: 1.0000 | OROMUCOSAL | Status: DC | PRN

## 2020-04-25 MED ORDER — LOSARTAN POTASSIUM 50 MG PO TABS
50.0000 mg | ORAL_TABLET | Freq: Every day | ORAL | Status: DC
  Administered 2020-04-25 – 2020-04-26 (×2): 50 mg via ORAL
  Filled 2020-04-25 (×2): qty 1

## 2020-04-25 MED ORDER — LACTATED RINGERS IV SOLN: INTRAVENOUS | Status: DC

## 2020-04-25 MED ORDER — FENTANYL CITRATE (PF) 100 MCG/2ML IJ SOLN
INTRAMUSCULAR | Status: AC
  Filled 2020-04-25: qty 2

## 2020-04-25 MED ORDER — PHENYLEPHRINE HCL-NACL 10-0.9 MG/250ML-% IV SOLN
INTRAVENOUS | Status: AC
  Filled 2020-04-25: qty 500

## 2020-04-25 MED ORDER — CLONIDINE HCL (ANALGESIA) 100 MCG/ML EP SOLN
EPIDURAL | Status: DC | PRN
  Administered 2020-04-25: .75 ug

## 2020-04-25 MED ORDER — BUPIVACAINE HCL (PF) 0.25 % IJ SOLN
INTRAMUSCULAR | Status: AC
  Filled 2020-04-25: qty 30

## 2020-04-25 MED ORDER — OXYCODONE HCL 5 MG PO TABS
ORAL_TABLET | ORAL | Status: AC
  Filled 2020-04-25: qty 1

## 2020-04-25 MED ORDER — METHOCARBAMOL 1000 MG/10ML IJ SOLN
500.0000 mg | Freq: Four times a day (QID) | INTRAVENOUS | Status: DC | PRN
  Filled 2020-04-25: qty 5

## 2020-04-25 MED ORDER — MIDAZOLAM HCL 2 MG/2ML IJ SOLN
INTRAMUSCULAR | Status: AC
  Filled 2020-04-25: qty 2

## 2020-04-25 MED ORDER — DEXAMETHASONE SODIUM PHOSPHATE 10 MG/ML IJ SOLN
INTRAMUSCULAR | Status: DC | PRN
  Administered 2020-04-25: 10 mg via INTRAVENOUS

## 2020-04-25 MED ORDER — OXYCODONE HCL 5 MG PO TABS: 5.0000 mg | ORAL_TABLET | Freq: Once | ORAL | Status: DC | PRN

## 2020-04-25 MED ORDER — DEXAMETHASONE SODIUM PHOSPHATE 10 MG/ML IJ SOLN
INTRAMUSCULAR | Status: AC
  Filled 2020-04-25: qty 1

## 2020-04-25 MED ORDER — PHENYLEPHRINE 40 MCG/ML (10ML) SYRINGE FOR IV PUSH (FOR BLOOD PRESSURE SUPPORT)
PREFILLED_SYRINGE | INTRAVENOUS | Status: AC
  Filled 2020-04-25: qty 10

## 2020-04-25 MED ORDER — PHENYLEPHRINE 40 MCG/ML (10ML) SYRINGE FOR IV PUSH (FOR BLOOD PRESSURE SUPPORT)
PREFILLED_SYRINGE | INTRAVENOUS | Status: DC | PRN
  Administered 2020-04-25: 40 ug via INTRAVENOUS
  Administered 2020-04-25: 80 ug via INTRAVENOUS
  Administered 2020-04-25: 40 ug via INTRAVENOUS
  Administered 2020-04-25 (×4): 80 ug via INTRAVENOUS
  Administered 2020-04-25: 40 ug via INTRAVENOUS
  Administered 2020-04-25: 80 ug via INTRAVENOUS
  Administered 2020-04-25: 40 ug via INTRAVENOUS

## 2020-04-25 MED ORDER — FENTANYL CITRATE (PF) 250 MCG/5ML IJ SOLN
INTRAMUSCULAR | Status: AC
  Filled 2020-04-25: qty 5

## 2020-04-25 MED ORDER — ACETAMINOPHEN 500 MG PO TABS
1000.0000 mg | ORAL_TABLET | Freq: Four times a day (QID) | ORAL | Status: AC
  Administered 2020-04-25 – 2020-04-26 (×4): 1000 mg via ORAL
  Filled 2020-04-25 (×3): qty 2

## 2020-04-25 MED ORDER — PROPOFOL 10 MG/ML IV BOLUS
INTRAVENOUS | Status: AC
  Filled 2020-04-25: qty 20

## 2020-04-25 MED ORDER — FENTANYL CITRATE (PF) 250 MCG/5ML IJ SOLN
INTRAMUSCULAR | Status: DC | PRN
  Administered 2020-04-25 (×2): 50 ug via INTRAVENOUS

## 2020-04-25 MED ORDER — GABAPENTIN 300 MG PO CAPS
300.0000 mg | ORAL_CAPSULE | Freq: Three times a day (TID) | ORAL | Status: DC
  Administered 2020-04-25 – 2020-04-26 (×3): 300 mg via ORAL
  Filled 2020-04-25 (×3): qty 1

## 2020-04-25 MED ORDER — MORPHINE SULFATE (PF) 4 MG/ML IV SOLN
INTRAVENOUS | Status: DC | PRN
  Administered 2020-04-25 (×2): 4 mg

## 2020-04-25 MED ORDER — LORATADINE 10 MG PO TABS
10.0000 mg | ORAL_TABLET | Freq: Every day | ORAL | Status: DC
Start: 2020-04-25 — End: 2020-04-25

## 2020-04-25 MED ORDER — BUPIVACAINE LIPOSOME 1.3 % IJ SUSP
20.0000 mL | INTRAMUSCULAR | Status: AC
  Administered 2020-04-25: 20 mL
  Filled 2020-04-25: qty 20

## 2020-04-25 MED ORDER — CHLORHEXIDINE GLUCONATE 0.12 % MT SOLN
15.0000 mL | Freq: Once | OROMUCOSAL | Status: AC
  Administered 2020-04-25: 15 mL via OROMUCOSAL
  Filled 2020-04-25: qty 15

## 2020-04-25 MED ORDER — BUPIVACAINE HCL 0.25 % IJ SOLN
INTRAMUSCULAR | Status: DC | PRN
  Administered 2020-04-25: 30 mL

## 2020-04-25 MED ORDER — ONDANSETRON HCL 4 MG/2ML IJ SOLN: 4.0000 mg | Freq: Four times a day (QID) | INTRAMUSCULAR | Status: DC | PRN

## 2020-04-25 MED ORDER — ASPIRIN 81 MG PO CHEW
81.0000 mg | CHEWABLE_TABLET | Freq: Two times a day (BID) | ORAL | Status: DC
  Administered 2020-04-25 – 2020-04-26 (×2): 81 mg via ORAL
  Filled 2020-04-25 (×2): qty 1

## 2020-04-25 MED ORDER — PROMETHAZINE HCL 25 MG/ML IJ SOLN: 6.2500 mg | INTRAMUSCULAR | Status: DC | PRN

## 2020-04-25 MED ORDER — PROPOFOL 500 MG/50ML IV EMUL
INTRAVENOUS | Status: AC
  Filled 2020-04-25: qty 50

## 2020-04-25 MED ORDER — PROPOFOL 500 MG/50ML IV EMUL
INTRAVENOUS | Status: DC | PRN
  Administered 2020-04-25 (×2): 100 ug/kg/min via INTRAVENOUS

## 2020-04-25 MED ORDER — FENTANYL CITRATE (PF) 100 MCG/2ML IJ SOLN
25.0000 ug | INTRAMUSCULAR | Status: DC | PRN
  Administered 2020-04-25: 25 ug via INTRAVENOUS

## 2020-04-25 MED ORDER — CEFAZOLIN SODIUM-DEXTROSE 2-4 GM/100ML-% IV SOLN
2.0000 g | INTRAVENOUS | Status: AC
  Administered 2020-04-25: 2 g via INTRAVENOUS
  Filled 2020-04-25: qty 100

## 2020-04-25 MED ORDER — CELECOXIB 200 MG PO CAPS
200.0000 mg | ORAL_CAPSULE | Freq: Two times a day (BID) | ORAL | Status: DC
  Administered 2020-04-25 – 2020-04-26 (×2): 200 mg via ORAL
  Filled 2020-04-25 (×2): qty 1

## 2020-04-25 MED ORDER — POVIDONE-IODINE 7.5 % EX SOLN
1.0000 "application " | Freq: Once | CUTANEOUS | Status: DC
  Filled 2020-04-25: qty 118

## 2020-04-25 MED ORDER — EPHEDRINE 5 MG/ML INJ
INTRAVENOUS | Status: AC
  Filled 2020-04-25: qty 10

## 2020-04-25 MED ORDER — OXYCODONE HCL 5 MG/5ML PO SOLN: 5.0000 mg | Freq: Once | ORAL | Status: DC | PRN

## 2020-04-25 MED ORDER — HYDROMORPHONE HCL 1 MG/ML IJ SOLN
0.5000 mg | INTRAMUSCULAR | Status: DC | PRN
  Administered 2020-04-25: 0.5 mg via INTRAVENOUS
  Filled 2020-04-25: qty 0.5

## 2020-04-25 MED ORDER — METOCLOPRAMIDE HCL 5 MG PO TABS: 5.0000 mg | ORAL_TABLET | Freq: Three times a day (TID) | ORAL | Status: DC | PRN

## 2020-04-25 MED ORDER — BUPIVACAINE IN DEXTROSE 0.75-8.25 % IT SOLN
INTRATHECAL | Status: DC | PRN
  Administered 2020-04-25: 1.8 mL via INTRATHECAL

## 2020-04-25 MED ORDER — LEVOCETIRIZINE DIHYDROCHLORIDE 5 MG PO TABS: 5.0000 mg | ORAL_TABLET | Freq: Every evening | ORAL | Status: DC | PRN

## 2020-04-25 MED ORDER — PROPOFOL 1000 MG/100ML IV EMUL
INTRAVENOUS | Status: AC
  Filled 2020-04-25: qty 200

## 2020-04-25 MED ORDER — MORPHINE SULFATE (PF) 4 MG/ML IV SOLN
INTRAVENOUS | Status: AC
  Filled 2020-04-25: qty 2

## 2020-04-25 MED ORDER — CLONIDINE HCL (ANALGESIA) 100 MCG/ML EP SOLN
EPIDURAL | Status: AC
  Filled 2020-04-25: qty 10

## 2020-04-25 MED ORDER — EPHEDRINE SULFATE-NACL 50-0.9 MG/10ML-% IV SOSY
PREFILLED_SYRINGE | INTRAVENOUS | Status: DC | PRN
  Administered 2020-04-25 (×2): 10 mg via INTRAVENOUS
  Administered 2020-04-25 (×4): 5 mg via INTRAVENOUS

## 2020-04-25 MED ORDER — ONDANSETRON HCL 4 MG/2ML IJ SOLN
INTRAMUSCULAR | Status: AC
  Filled 2020-04-25: qty 2

## 2020-04-25 MED ORDER — SODIUM CHLORIDE 0.9% FLUSH
INTRAVENOUS | Status: DC | PRN
  Administered 2020-04-25 (×2): 10 mL

## 2020-04-25 MED ORDER — LORATADINE 10 MG PO TABS
10.0000 mg | ORAL_TABLET | Freq: Every day | ORAL | Status: DC
  Administered 2020-04-26: 10 mg via ORAL
  Filled 2020-04-25: qty 1

## 2020-04-25 MED ORDER — VANCOMYCIN HCL 1000 MG IV SOLR
INTRAVENOUS | Status: AC
  Filled 2020-04-25: qty 1000

## 2020-04-25 MED ORDER — MENTHOL 3 MG MT LOZG: 1.0000 | LOZENGE | OROMUCOSAL | Status: DC | PRN

## 2020-04-25 MED ORDER — KETAMINE HCL 50 MG/5ML IJ SOSY
PREFILLED_SYRINGE | INTRAMUSCULAR | Status: AC
  Filled 2020-04-25: qty 5

## 2020-04-25 MED ORDER — TRANEXAMIC ACID-NACL 1000-0.7 MG/100ML-% IV SOLN
1000.0000 mg | INTRAVENOUS | Status: AC
  Administered 2020-04-25: 1000 mg via INTRAVENOUS
  Filled 2020-04-25: qty 100

## 2020-04-25 MED ORDER — METHOCARBAMOL 500 MG PO TABS
500.0000 mg | ORAL_TABLET | Freq: Four times a day (QID) | ORAL | Status: DC | PRN
  Administered 2020-04-26: 500 mg via ORAL
  Filled 2020-04-25: qty 1

## 2020-04-25 MED ORDER — ORAL CARE MOUTH RINSE: 15.0000 mL | Freq: Once | OROMUCOSAL | Status: AC

## 2020-04-25 MED ORDER — LIDOCAINE 2% (20 MG/ML) 5 ML SYRINGE
INTRAMUSCULAR | Status: AC
  Filled 2020-04-25: qty 5

## 2020-04-25 MED ORDER — MIDAZOLAM HCL 5 MG/5ML IJ SOLN
INTRAMUSCULAR | Status: DC | PRN
  Administered 2020-04-25: 2 mg via INTRAVENOUS

## 2020-04-25 MED ORDER — ACETAMINOPHEN 500 MG PO TABS
1000.0000 mg | ORAL_TABLET | Freq: Once | ORAL | Status: AC
  Administered 2020-04-25: 1000 mg via ORAL
  Filled 2020-04-25: qty 2

## 2020-04-25 MED ORDER — POVIDONE-IODINE 10 % EX SWAB
2.0000 "application " | Freq: Once | CUTANEOUS | Status: AC
  Administered 2020-04-25: 2 via TOPICAL

## 2020-04-25 MED ORDER — OXYCODONE HCL 5 MG PO TABS
5.0000 mg | ORAL_TABLET | ORAL | Status: DC | PRN
  Administered 2020-04-25: 5 mg via ORAL
  Administered 2020-04-25 – 2020-04-26 (×4): 10 mg via ORAL
  Filled 2020-04-25 (×2): qty 2
  Filled 2020-04-25 (×2): qty 1
  Filled 2020-04-25: qty 2

## 2020-04-25 MED ORDER — IRRISEPT - 450ML BOTTLE WITH 0.05% CHG IN STERILE WATER, USP 99.95% OPTIME
TOPICAL | Status: DC | PRN
  Administered 2020-04-25: 450 mL via TOPICAL

## 2020-04-25 MED ORDER — 0.9 % SODIUM CHLORIDE (POUR BTL) OPTIME
TOPICAL | Status: DC | PRN
  Administered 2020-04-25: 1000 mL

## 2020-04-25 MED ORDER — METOCLOPRAMIDE HCL 5 MG/ML IJ SOLN: 5.0000 mg | Freq: Three times a day (TID) | INTRAMUSCULAR | Status: DC | PRN

## 2020-04-25 MED ORDER — BUPIVACAINE-MELOXICAM ER 400-12 MG/14ML IJ SOLN
INTRAMUSCULAR | Status: AC
  Filled 2020-04-25: qty 1

## 2020-04-25 MED ORDER — ONDANSETRON HCL 4 MG/2ML IJ SOLN
INTRAMUSCULAR | Status: DC | PRN
  Administered 2020-04-25: 4 mg via INTRAVENOUS

## 2020-04-25 MED ORDER — CEFAZOLIN SODIUM-DEXTROSE 1-4 GM/50ML-% IV SOLN
1.0000 g | Freq: Three times a day (TID) | INTRAVENOUS | Status: AC
  Administered 2020-04-25 (×2): 1 g via INTRAVENOUS
  Filled 2020-04-25 (×2): qty 50

## 2020-04-25 MED ORDER — DOCUSATE SODIUM 100 MG PO CAPS
100.0000 mg | ORAL_CAPSULE | Freq: Two times a day (BID) | ORAL | Status: DC
  Administered 2020-04-25 – 2020-04-26 (×2): 100 mg via ORAL
  Filled 2020-04-25 (×2): qty 1

## 2020-04-25 MED ORDER — POVIDONE-IODINE 10 % EX SWAB: 2.0000 "application " | Freq: Once | CUTANEOUS | Status: DC

## 2020-04-25 MED ORDER — SODIUM CHLORIDE 0.9 % IR SOLN
Status: DC | PRN
  Administered 2020-04-25: 3000 mL

## 2020-04-25 MED ORDER — OXYBUTYNIN CHLORIDE ER 15 MG PO TB24
15.0000 mg | ORAL_TABLET | Freq: Every day | ORAL | Status: DC | PRN
  Filled 2020-04-25: qty 1

## 2020-04-25 MED ORDER — ONDANSETRON HCL 4 MG PO TABS: 4.0000 mg | ORAL_TABLET | Freq: Four times a day (QID) | ORAL | Status: DC | PRN

## 2020-04-25 SURGICAL SUPPLY — 81 items
BAG DECANTER FOR FLEXI CONT (MISCELLANEOUS) ×2 IMPLANT
BANDAGE ESMARK 6X9 LF (GAUZE/BANDAGES/DRESSINGS) ×1 IMPLANT
BASEPLATE TIBIAL SZ2 TRI (Joint) ×1 IMPLANT
BLADE SAG 18X100X1.27 (BLADE) ×2 IMPLANT
BNDG COHESIVE 6X5 TAN STRL LF (GAUZE/BANDAGES/DRESSINGS) ×2 IMPLANT
BNDG ELASTIC 6X10 VLCR STRL LF (GAUZE/BANDAGES/DRESSINGS) ×2 IMPLANT
BNDG ELASTIC 6X15 VLCR STRL LF (GAUZE/BANDAGES/DRESSINGS) ×2 IMPLANT
BNDG ESMARK 6X9 LF (GAUZE/BANDAGES/DRESSINGS) ×2
BOWL SMART MIX CTS (DISPOSABLE) IMPLANT
CEMENT BONE SIMPLEX SPEEDSET (Cement) ×2 IMPLANT
CLSR STERI-STRIP ANTIMIC 1/2X4 (GAUZE/BANDAGES/DRESSINGS) ×2 IMPLANT
CNTNR URN SCR LID CUP LEK RST (MISCELLANEOUS) ×1 IMPLANT
COMP FEMORAL CMTL TRIATH SZ2 (Joint) ×2 IMPLANT
COMPONENT FEMRL CMTL TRITH SZ2 (Joint) ×1 IMPLANT
CONT SPEC 4OZ STRL OR WHT (MISCELLANEOUS) ×1
COVER SURGICAL LIGHT HANDLE (MISCELLANEOUS) ×2 IMPLANT
COVER WAND RF STERILE (DRAPES) ×2 IMPLANT
CUFF TOURN SGL QUICK 34 (TOURNIQUET CUFF) ×1
CUFF TOURN SGL QUICK 42 (TOURNIQUET CUFF) IMPLANT
CUFF TRNQT CYL 34X4.125X (TOURNIQUET CUFF) ×1 IMPLANT
DECANTER SPIKE VIAL GLASS SM (MISCELLANEOUS) ×2 IMPLANT
DRAPE INCISE IOBAN 66X45 STRL (DRAPES) IMPLANT
DRAPE ORTHO SPLIT 77X108 STRL (DRAPES) ×3
DRAPE SURG ORHT 6 SPLT 77X108 (DRAPES) ×3 IMPLANT
DRAPE U-SHAPE 47X51 STRL (DRAPES) ×2 IMPLANT
DRSG AQUACEL AG ADV 3.5X10 (GAUZE/BANDAGES/DRESSINGS) ×2 IMPLANT
DRSG AQUACEL AG ADV 3.5X14 (GAUZE/BANDAGES/DRESSINGS) IMPLANT
DURAPREP 26ML APPLICATOR (WOUND CARE) ×4 IMPLANT
ELECT CAUTERY BLADE 6.4 (BLADE) ×2 IMPLANT
ELECT REM PT RETURN 9FT ADLT (ELECTROSURGICAL) ×2
ELECTRODE REM PT RTRN 9FT ADLT (ELECTROSURGICAL) ×1 IMPLANT
GAUZE SPONGE 4X4 12PLY STRL (GAUZE/BANDAGES/DRESSINGS) ×2 IMPLANT
GLOVE ECLIPSE 7.0 STRL STRAW (GLOVE) ×2 IMPLANT
GLOVE ECLIPSE 8.0 STRL XLNG CF (GLOVE) ×2 IMPLANT
GLOVE SRG 8 PF TXTR STRL LF DI (GLOVE) ×1 IMPLANT
GLOVE SURG UNDER POLY LF SZ7 (GLOVE) ×2 IMPLANT
GLOVE SURG UNDER POLY LF SZ8 (GLOVE) ×1
GOWN STRL REUS W/ TWL LRG LVL3 (GOWN DISPOSABLE) ×3 IMPLANT
GOWN STRL REUS W/TWL LRG LVL3 (GOWN DISPOSABLE) ×3
HANDPIECE INTERPULSE COAX TIP (DISPOSABLE) ×1
HOOD PEEL AWAY FLYTE STAYCOOL (MISCELLANEOUS) ×6 IMPLANT
IMMOBILIZER KNEE 20 (SOFTGOODS) ×2
IMMOBILIZER KNEE 20 THIGH 36 (SOFTGOODS) ×1 IMPLANT
IMMOBILIZER KNEE 22 UNIV (SOFTGOODS) IMPLANT
IMMOBILIZER KNEE 24 THIGH 36 (MISCELLANEOUS) IMPLANT
IMMOBILIZER KNEE 24 UNIV (MISCELLANEOUS)
INSERT TIB BEARING SZ 2 KNEE (Knees) ×2 IMPLANT
INSERT TIBIA KNEE BRG SZ2 11MM (Insert) ×2 IMPLANT
KIT BASIN OR (CUSTOM PROCEDURE TRAY) ×2 IMPLANT
KIT TURNOVER KIT B (KITS) ×2 IMPLANT
MANIFOLD NEPTUNE II (INSTRUMENTS) ×2 IMPLANT
NEEDLE 22X1 1/2 (OR ONLY) (NEEDLE) ×4 IMPLANT
NEEDLE SPNL 18GX3.5 QUINCKE PK (NEEDLE) ×2 IMPLANT
NS IRRIG 1000ML POUR BTL (IV SOLUTION) ×4 IMPLANT
PACK TOTAL JOINT (CUSTOM PROCEDURE TRAY) ×2 IMPLANT
PAD ARMBOARD 7.5X6 YLW CONV (MISCELLANEOUS) ×4 IMPLANT
PAD CAST 4YDX4 CTTN HI CHSV (CAST SUPPLIES) ×1 IMPLANT
PADDING CAST COTTON 4X4 STRL (CAST SUPPLIES) ×1
PADDING CAST COTTON 6X4 STRL (CAST SUPPLIES) ×2 IMPLANT
PATELLA TRIATHALON (Knees) ×2 IMPLANT
PIN FLUTED HEDLESS FIX 3.5X1/8 (PIN) ×2 IMPLANT
SET HNDPC FAN SPRY TIP SCT (DISPOSABLE) ×1 IMPLANT
STRIP CLOSURE SKIN 1/2X4 (GAUZE/BANDAGES/DRESSINGS) ×4 IMPLANT
SUCTION FRAZIER HANDLE 10FR (MISCELLANEOUS) ×1
SUCTION TUBE FRAZIER 10FR DISP (MISCELLANEOUS) ×1 IMPLANT
SUT MNCRL AB 3-0 PS2 18 (SUTURE) ×2 IMPLANT
SUT VIC AB 0 CT1 27 (SUTURE) ×3
SUT VIC AB 0 CT1 27XBRD ANBCTR (SUTURE) ×3 IMPLANT
SUT VIC AB 1 CT1 27 (SUTURE) ×5
SUT VIC AB 1 CT1 27XBRD ANBCTR (SUTURE) ×5 IMPLANT
SUT VIC AB 1 CT1 36 (SUTURE) ×4 IMPLANT
SUT VIC AB 2-0 CT1 27 (SUTURE) ×4
SUT VIC AB 2-0 CT1 TAPERPNT 27 (SUTURE) ×4 IMPLANT
SYR 30ML LL (SYRINGE) ×6 IMPLANT
SYR 30ML SLIP (SYRINGE) ×2 IMPLANT
SYR TB 1ML LUER SLIP (SYRINGE) ×2 IMPLANT
TIBIAL BASEPLATE SZ2 TRI (Joint) ×2 IMPLANT
TOWEL GREEN STERILE (TOWEL DISPOSABLE) ×4 IMPLANT
TOWEL GREEN STERILE FF (TOWEL DISPOSABLE) ×4 IMPLANT
TRAY CATH 16FR W/PLASTIC CATH (SET/KITS/TRAYS/PACK) IMPLANT
WATER STERILE IRR 1000ML POUR (IV SOLUTION) IMPLANT

## 2020-04-25 NOTE — Anesthesia Procedure Notes (Signed)
Spinal  Patient location during procedure: OR Start time: 04/25/2020 7:37 AM End time: 04/25/2020 7:41 AM Staffing Performed: anesthesiologist  Anesthesiologist: Brennan Bailey, MD Preanesthetic Checklist Completed: patient identified, IV checked, risks and benefits discussed, surgical consent, monitors and equipment checked, pre-op evaluation and timeout performed Spinal Block Patient position: sitting Prep: DuraPrep and site prepped and draped Patient monitoring: continuous pulse ox, blood pressure and heart rate Approach: midline Location: L2-3 Injection technique: single-shot Needle Needle type: Pencan  Needle gauge: 24 G Needle length: 9 cm Additional Notes Risks, benefits, and alternative discussed. Patient gave consent to procedure. Prepped and draped in sitting position. Patient sedated but responsive to voice. Clear CSF obtained after second attempt. Positive terminal aspiration. No pain or paraesthesias with injection. Patient tolerated procedure well. Vital signs stable. Tawny Asal, MD

## 2020-04-25 NOTE — Progress Notes (Signed)
Patient admitted to room, alert and oriented x4. Skin dry and warm to touch without any issue noted. Mild pain voiced, ice applied.

## 2020-04-25 NOTE — Progress Notes (Signed)
Orthopedic Tech Progress Note Patient Details:  Christina [name] October 17, 1960 [id number]  CPM Right Knee CPM Right Knee: On Right Knee Flexion (Degrees): 40 Right Knee Extension (Degrees): 10 Additional Comments: Fresh ice  Post Interventions Patient Tolerated: Well Instructions Provided: Care of device Ortho Devices Type of Ortho Device: Bone foam zero knee Ortho Device/Splint Location: RLE Ortho Device/Splint Interventions: Ordered,Other (comment)   Post Interventions Patient Tolerated: Well Instructions Provided: Care of device   Christina [name] 04/25/2020, 1:55 PM

## 2020-04-25 NOTE — Anesthesia Procedure Notes (Signed)
Anesthesia Regional Block: Adductor canal block   Pre-Anesthetic Checklist: ,, timeout performed, Correct Patient, Correct Site, Correct Laterality, Correct Procedure, Correct Position, site marked, Risks and benefits discussed, pre-op evaluation,  At surgeon's request and post-op pain management  Laterality: Right  Prep: Maximum Sterile Barrier Precautions used, chloraprep       Needles:  Injection technique: Single-shot  Needle Type: Echogenic Stimulator Needle     Needle Length: 9cm  Needle Gauge: 22     Additional Needles:   Procedures:,,,, ultrasound used (permanent image in chart),,,,  Narrative:  Start time: 04/25/2020 7:25 AM End time: 04/25/2020 7:27 AM Injection made incrementally with aspirations every 5 mL.  Performed by: Personally  Anesthesiologist: Brennan Bailey, MD  Additional Notes: Risks, benefits, and alternative discussed. Patient gave consent for procedure. Patient prepped and draped in sterile fashion. Sedation administered, patient remains easily responsive to voice. Relevant anatomy identified with ultrasound guidance. Local anesthetic given in 5cc increments with no signs or symptoms of intravascular injection. No pain or paraesthesias with injection. Patient monitored throughout procedure with signs of LAST or immediate complications. Tolerated well. Ultrasound image placed in chart.  Tawny Asal, MD

## 2020-04-25 NOTE — Brief Op Note (Signed)
   04/25/2020  10:27 AM  PATIENT:  Christina [name]  60 y.o. female  PRE-OPERATIVE DIAGNOSIS:  right knee osteoarthritis  POST-OPERATIVE DIAGNOSIS:  right knee osteoarthritis  PROCEDURE:  Procedure(s): RIGHT TOTAL KNEE ARTHROPLASTY  SURGEON:  Surgeon(s): Meredith Pel, MD  ASSISTANT: magnant pa  ANESTHESIA:   spinal  EBL: 50 ml    Total I/O In: 1200 [I.V.:1000; IV Piggyback:200] Out: 10 [Blood:10]  BLOOD ADMINISTERED: none  DRAINS: none   LOCAL MEDICATIONS USED: Marcaine morphine clonidine Exparel vancomycin powder  SPECIMEN:  No Specimen  COUNTS:  YES  TOURNIQUET:   Total Tourniquet Time Documented: Thigh (Right) - 80 minutes Total: Thigh (Right) - 80 minutes   DICTATION: .Other Dictation: Dictation Number [id number]  PLAN OF CARE: Admit for overnight observation  PATIENT DISPOSITION:  PACU - hemodynamically stable

## 2020-04-25 NOTE — Anesthesia Postprocedure Evaluation (Signed)
Anesthesia Post Note  Patient: Christina [name]  Procedure(s) Performed: RIGHT TOTAL KNEE ARTHROPLASTY (Right Knee)     Patient location during evaluation: PACU Anesthesia Type: Spinal Level of consciousness: awake and alert and oriented Pain management: pain level controlled Vital Signs Assessment: post-procedure vital signs reviewed and stable Respiratory status: spontaneous breathing, nonlabored ventilation and respiratory function stable Cardiovascular status: blood pressure returned to baseline Postop Assessment: no apparent nausea or vomiting and spinal receding Anesthetic complications: no   No complications documented.  Last Vitals:  Vitals:   04/25/20 1327 04/25/20 1342  BP: 133/90 (!) 128/91  Pulse: 82 83  Resp: 17 16  Temp:    SpO2: 98% 96%    Last Pain:  Vitals:   04/25/20 1327  PainSc: Betances Laporsche Hoeger

## 2020-04-25 NOTE — H&P (Signed)
TOTAL KNEE ADMISSION H&P  Patient is being admitted for right total knee arthroplasty.  Subjective:  Chief Complaint:right knee pain.  HPI: Christina [name], 60 y.o. female, has a history of pain and functional disability in the right knee due to arthritis and has failed non-surgical conservative treatments for greater than 12 weeks to includeNSAID's and/or analgesics, corticosteriod injections, viscosupplementation injections, flexibility and strengthening excercises, supervised PT with diminished ADL's post treatment and activity modification.  Onset of symptoms was gradual, starting 8 years ago with gradually worsening course since that time. The patient noted no past surgery on the right knee(s).  Patient currently rates pain in the right knee(s) at 8 out of 10 with activity. Patient has night pain, worsening of pain with activity and weight bearing, pain that interferes with activities of daily living, pain with passive range of motion, crepitus and joint swelling.  Patient has evidence of subchondral sclerosis, periarticular osteophytes and joint space narrowing by imaging studies. This patient has had Significant worsening pain over the past year with interference with activities of daily living.  MRI scan shows severe patellofemoral arthritis and early bone-on-bone medial compartment arthritis affecting about 20 to 30% of the medial compartment surface area.. There is no active infection.  Patient Active Problem List   Diagnosis Date Noted  . Degenerative arthritis of knee, bilateral 12/31/2019  . Patellofemoral arthritis of left knee 03/10/2019  . Acute medial meniscus tear of left knee 11/19/2018  . Sprain of medial collateral ligament of left knee 09/01/2018  . Neck strain, initial encounter 08/28/2018  . Degenerative disc disease, cervical 08/28/2018  . Left lumbar radiculopathy 03/05/2018  . Right lateral epicondylitis 10/30/2017  . SI (sacroiliac) joint dysfunction 10/30/2017  .  Nonallopathic lesion of sacral region 10/30/2017  . Nonallopathic lesion of lumbar region 10/30/2017  . Nonallopathic lesion of thoracic region 10/30/2017  . History of recurrent UTIs 04/11/2017  . 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 Allergic rhinitis 10/29/2016   Past Medical History:  Diagnosis Date  . Arthritis   . Chicken pox   . HLD (hyperlipidemia)    diet controlled  . Hypertension   . Recurrent upper respiratory infection (URI)   . Seasonal allergies     Past Surgical History:  Procedure Laterality Date  . BLADDER SUSPENSION    . CHOLECYSTECTOMY    . COLONOSCOPY    . WISDOM TOOTH EXTRACTION      Current Facility-Administered Medications  Medication Dose Route Frequency Provider Last Rate Last Admin  . bupivacaine liposome (EXPAREL) 1.3 % injection 266 mg  20 mL Infiltration To OR Meredith Pel, MD      . ceFAZolin (ANCEF) IVPB 2g/100 mL premix  2 g Intravenous On Call to OR Magnant, Charles L, PA-C      . lactated ringers infusion   Intravenous Continuous Belinda Block, MD   New Bag at 04/25/20 [phone]  . povidone-iodine (BETADINE) 7.5 % scrub 1 application  1 application Topical Once Magnant, Charles L, PA-C      . povidone-iodine 10 % swab 2 application  2 application Topical Once Magnant, Charles L, PA-C      . tranexamic acid (CYKLOKAPRON) 2,000 mg in sodium chloride 0.9 % 50 mL Topical Application  6,568 mg Topical To OR Meredith Pel, MD      . tranexamic acid (CYKLOKAPRON) IVPB 1,000 mg  1,000 mg Intravenous To OR Magnant, Charles L, PA-C       No Known Allergies  Social History  Tobacco Use  . Smoking status: Former Research scientist (life sciences)  . Smokeless tobacco: Never Used  Substance Use Topics  . Alcohol use: Yes    Comment: occasionally    Family History  Problem Relation Age of Onset  . Cancer Mother   . Hyperlipidemia Mother   . Hypertension Mother   . Stroke Mother   . Breast cancer Mother   . Cancer Father   . Prostate cancer Father   .  Hypertension Maternal Grandmother   . Hypertension Paternal Grandmother   . Stroke Paternal Grandmother   . Allergic rhinitis Neg Hx   . Asthma Neg Hx      Review of Systems  Musculoskeletal: Positive for arthralgias.  All other systems reviewed and are negative.   Objective:  Physical Exam Vitals reviewed.  HENT:     Head: Normocephalic.     Nose: Nose normal.     Mouth/Throat:     Mouth: Mucous membranes are moist.  Eyes:     Pupils: Pupils are equal, round, and reactive to light.  Cardiovascular:     Rate and Rhythm: Normal rate.     Pulses: Normal pulses.  Pulmonary:     Effort: Pulmonary effort is normal.  Abdominal:     General: Abdomen is flat.  Musculoskeletal:     Cervical back: Normal range of motion.  Skin:    General: Skin is warm.     Capillary Refill: Capillary refill takes less than 2 seconds.  Neurological:     General: No focal deficit present.     Mental Status: She is alert.  Psychiatric:        Mood and Affect: Mood normal.   Examination of the right knee demonstrates palpable pedal pulses with intact ankle dorsiflexion.  Collaterals are stable.  Skin is intact.  Range of motion 0 to about 120 with significant patellofemoral crepitus present along with medial joint line tenderness  Vital signs in last 24 hours:    Labs:   Estimated body mass index is 27.42 kg/m as calculated from the following:   Height as of 04/20/20: 5\' 1"  (1.549 m).   Weight as of 04/20/20: 65.8 kg.   Imaging Review Plain radiographs demonstrate moderate degenerative joint disease of the right knee(s). The overall alignment isneutral. The bone quality appears to be good for age and reported activity level.      Assessment/Plan:  End stage arthritis, right knee   The patient history, physical examination, clinical judgment of the provider and imaging studies are consistent with end stage degenerative joint disease of the right knee(s) and total knee arthroplasty is  deemed medically necessary. The treatment options including medical management, injection therapy arthroscopy and arthroplasty were discussed at length. The risks and benefits of total knee arthroplasty were presented and reviewed. The risks due to aseptic loosening, infection, stiffness, patella tracking problems, thromboembolic complications and other imponderables were discussed. The patient acknowledged the explanation, agreed to proceed with the plan and consent was signed. Patient is being admitted for inpatient treatment for surgery, pain control, PT, OT, prophylactic antibiotics, VTE prophylaxis, progressive ambulation and ADL's and discharge planning. The patient is planning to be discharged home with home health services     Patient's anticipated LOS is less than 2 midnights, meeting these requirements: - Younger than 59 - Lives within 1 hour of care - Has a competent adult at home to recover with post-op recover - NO history of  - Chronic pain requiring opiods  - Diabetes  -  Coronary Artery Disease  - Heart failure  - Heart attack  - Stroke  - DVT/VTE  - Cardiac arrhythmia  - Respiratory Failure/COPD  - Renal failure  - Anemia  - Advanced Liver disease

## 2020-04-25 NOTE — Transfer of Care (Signed)
Immediate Anesthesia Transfer of Care Note  Patient: Christina [name]  Procedure(s) Performed: RIGHT TOTAL KNEE ARTHROPLASTY (Right Knee)  Patient Location: PACU  Anesthesia Type:MAC, Regional and Spinal  Level of Consciousness: awake, alert , oriented, drowsy and patient cooperative  Airway & Oxygen Therapy: Patient Spontanous Breathing  Post-op Assessment: Report given to RN, Post -op Vital signs reviewed and stable and Patient moving all extremities  Post vital signs: Reviewed and stable  Last Vitals:  Vitals Value Taken Time  BP 106/79 04/25/20 1026  Temp    Pulse 84 04/25/20 1027  Resp 21 04/25/20 1027  SpO2 95 % 04/25/20 1027  Vitals shown include unvalidated device data.  Last Pain:  Vitals:   04/25/20 0618  PainSc: 6       Patients Stated Pain Goal: 3 (67/67/20 9470)  Complications: No complications documented.

## 2020-04-25 NOTE — Evaluation (Signed)
Physical Therapy Evaluation Patient Details Name: Christina [name] MRN: [id number] DOB: Mar 03, 1960 Today's Date: 04/25/2020   History of Present Illness  Pt is a 60 y/o female s/p R TKA on 3/8. PMH includes HTN.  Clinical Impression  Pt is s/p surgery above with deficits below. Pt tolerated mobility well requiring min to min guard A with use of RW. Educated about knee precautions. Will continue to follow acutely.     Follow Up Recommendations Follow surgeon's recommendation for DC plan and follow-up therapies    Equipment Recommendations  None recommended by PT    Recommendations for Other Services       Precautions / Restrictions Precautions Precautions: Knee Precaution Booklet Issued: No Precaution Comments: Verbally reviewed knee precautions. Restrictions Weight Bearing Restrictions: Yes RLE Weight Bearing: Weight bearing as tolerated      Mobility  Bed Mobility Overal bed mobility: Needs Assistance Bed Mobility: Supine to Sit;Sit to Supine     Supine to sit: Min guard Sit to supine: Min guard   General bed mobility comments: Min guard for safety.    Transfers Overall transfer level: Needs assistance Equipment used: Rolling walker (2 wheeled) Transfers: Sit to/from Stand Sit to Stand: Min assist         General transfer comment: Min A for steadying assist to stand. Cues for hand placement.  Ambulation/Gait Ambulation/Gait assistance: Min guard Gait Distance (Feet): 150 Feet Assistive device: Rolling walker (2 wheeled) Gait Pattern/deviations: Step-to pattern;Decreased step length - right;Decreased step length - left;Decreased weight shift to right;Antalgic Gait velocity: Decreased   General Gait Details: Slow, mildly antalgic gait. Cues for sequencing using RW.  Stairs            Wheelchair Mobility    Modified Rankin (Stroke Patients Only)       Balance Overall balance assessment: Needs assistance Sitting-balance support: No upper extremity  supported;Feet supported Sitting balance-Leahy Scale: Good     Standing balance support: Bilateral upper extremity supported;During functional activity Standing balance-Leahy Scale: Poor Standing balance comment: Reliant on BUE support                             Pertinent Vitals/Pain Pain Assessment: Faces Faces Pain Scale: Hurts little more Pain Location: R knee Pain Descriptors / Indicators: Burning;Operative site guarding Pain Intervention(s): Limited activity within patient's tolerance;Monitored during session;Repositioned    Home Living Family/patient expects to be discharged to:: Private residence Living Arrangements: Spouse/significant other Available Help at Discharge: Family;Available 24 hours/day Type of Home: House Home Access: Stairs to enter Entrance Stairs-Rails: Right Entrance Stairs-Number of Steps: 2 Home Layout: Able to live on main level with bedroom/bathroom Home Equipment: Walker - 2 wheels;Cane - single point;Bedside commode      Prior Function Level of Independence: Independent               Hand Dominance        Extremity/Trunk Assessment   Upper Extremity Assessment Upper Extremity Assessment: Overall WFL for tasks assessed    Lower Extremity Assessment Lower Extremity Assessment: RLE deficits/detail RLE Deficits / Details: Deficits consistent with post op pain and weakness.    Cervical / Trunk Assessment Cervical / Trunk Assessment: Other exceptions Cervical / Trunk Exceptions: Pt reporting lumbar issues at baseline.  Communication   Communication: No difficulties  Cognition Arousal/Alertness: Awake/alert Behavior During Therapy: WFL for tasks assessed/performed Overall Cognitive Status: Within Functional Limits for tasks assessed  General Comments      Exercises     Assessment/Plan    PT Assessment Patient needs continued PT services  PT Problem List  Decreased strength;Decreased range of motion;Decreased balance;Decreased mobility;Decreased knowledge of use of DME;Decreased knowledge of precautions;Pain       PT Treatment Interventions DME instruction;Gait training;Stair training;Functional mobility training;Therapeutic activities;Balance training;Therapeutic exercise;Patient/family education    PT Goals (Current goals can be found in the Care Plan section)  Acute Rehab PT Goals Patient Stated Goal: to go to [place] and hike PT Goal Formulation: With patient Time For Goal Achievement: 05/09/20 Potential to Achieve Goals: Good    Frequency 7X/week   Barriers to discharge        Co-evaluation               AM-PAC PT "6 Clicks" Mobility  Outcome Measure Help needed turning from your back to your side while in a flat bed without using bedrails?: None Help needed moving from lying on your back to sitting on the side of a flat bed without using bedrails?: A Little Help needed moving to and from a bed to a chair (including a wheelchair)?: A Little Help needed standing up from a chair using your arms (e.g., wheelchair or bedside chair)?: A Little Help needed to walk in hospital room?: A Little Help needed climbing 3-5 steps with a railing? : A Little 6 Click Score: 19    End of Session Equipment Utilized During Treatment: Gait belt Activity Tolerance: Patient tolerated treatment well Patient left: in bed;with call bell/phone within reach (in bed in PACU) Nurse Communication: Mobility status PT Visit Diagnosis: Other abnormalities of gait and mobility (R26.89);Pain Pain - Right/Left: Right Pain - part of body: Knee    Time: 1400-1434 PT Time Calculation (min) (ACUTE ONLY): 34 min   Charges:   PT Evaluation $PT Eval Low Complexity: 1 Low PT Treatments $Gait Training: 8-22 mins        Lou Miner, DPT  Acute Rehabilitation Services  Pager: [phone] Office: [phone]   Rudean Hitt 04/25/2020, 2:57 PM

## 2020-04-26 ENCOUNTER — Encounter (HOSPITAL_COMMUNITY): Payer: Self-pay | Admitting: Orthopedic Surgery

## 2020-04-26 DIAGNOSIS — M1711 Unilateral primary osteoarthritis, right knee: Secondary | ICD-10-CM | POA: Diagnosis not present

## 2020-04-26 MED ORDER — ASPIRIN 81 MG PO CHEW: 81.0000 mg | CHEWABLE_TABLET | Freq: Two times a day (BID) | ORAL | 0 refills | Status: DC

## 2020-04-26 MED ORDER — KETOROLAC TROMETHAMINE 10 MG PO TABS: 10.0000 mg | ORAL_TABLET | Freq: Three times a day (TID) | ORAL | 0 refills | Status: DC | PRN

## 2020-04-26 MED ORDER — METHOCARBAMOL 500 MG PO TABS: 500.0000 mg | ORAL_TABLET | Freq: Three times a day (TID) | ORAL | 0 refills | Status: DC | PRN

## 2020-04-26 MED ORDER — GABAPENTIN 300 MG PO CAPS: 300.0000 mg | ORAL_CAPSULE | Freq: Three times a day (TID) | ORAL | 0 refills | Status: DC

## 2020-04-26 MED ORDER — OXYCODONE-ACETAMINOPHEN 5-325 MG PO TABS: 1.0000 | ORAL_TABLET | ORAL | 0 refills | Status: DC | PRN

## 2020-04-26 NOTE — Progress Notes (Signed)
Pt given discharge instructions and gone over with her. She verbalized understanding. Pt belongings gathered to be sent home with her. Husband to drive pt home. Pt in no distress at discharge.

## 2020-04-26 NOTE — Progress Notes (Signed)
Physical Therapy Treatment Patient Details Name: Christina [name] MRN: [id number] DOB: Jul 03, 1960 Today's Date: 04/26/2020    History of Present Illness Pt is a 60 y/o female s/p R TKA on 3/8. PMH includes HTN.    PT Comments    Pt motivated to progress mobility and practice steps so she can d/c. Pt ambulatory in hallway with RW today, PT encouraging pt to progress to step-through gait as tolerated by pain and R knee stability. Pt proficiently navigated steps with cuing from PT, written instructions for steps provided for pt and family for safe step navigation at home. Pt completed TKA exercises, handout administered, no further questions for PT. Pt appropriate to d/c from acute setting with supervision assist from family from a PT standpoint.    Follow Up Recommendations  Follow surgeon's recommendation for DC plan and follow-up therapies     Equipment Recommendations  None recommended by PT    Recommendations for Other Services       Precautions / Restrictions Precautions Precautions: Knee Precaution Booklet Issued: No Precaution Comments: reviewed keeping RLE straight when resting/supine, bone foam wear schedule per MD 20-30 minutes 3x/day Restrictions Weight Bearing Restrictions: No RLE Weight Bearing: Weight bearing as tolerated    Mobility  Bed Mobility Overal bed mobility: Needs Assistance Bed Mobility: Supine to Sit;Sit to Supine     Supine to sit: Supervision Sit to supine: Supervision   General bed mobility comments: for safety, verbal cuing for sequencing to and from EOB.    Transfers Overall transfer level: Needs assistance Equipment used: Rolling walker (2 wheeled) Transfers: Sit to/from Stand Sit to Stand: Min guard         General transfer comment: for safety, verbal cuing for hand placement when rising/sitting  Ambulation/Gait Ambulation/Gait assistance: Supervision Gait Distance (Feet): 160 Feet Assistive device: Rolling walker (2 wheeled) Gait  Pattern/deviations: Step-to pattern;Decreased weight shift to right;Antalgic;Step-through pattern;Decreased stride length;Trunk flexed Gait velocity: decr   General Gait Details: min gaurd initially, transitioning to supervision for safety. Verbal cuing for upright posture, transitioning step-to to step-through gait with constant speed of RW, and placement within RW during mobility.   Stairs Stairs: Yes Stairs assistance: Min guard Stair Management: Step to pattern;One rail Right;Sideways Number of Stairs: 3 General stair comments: min guard for safety, verbal cuing for sequencing ("up with the good leg leading, down with the bad leg leading"), taking her time, and upright posture/positioning of hands of railing.   Wheelchair Mobility    Modified Rankin (Stroke Patients Only)       Balance Overall balance assessment: Needs assistance Sitting-balance support: No upper extremity supported;Feet supported Sitting balance-Leahy Scale: Good     Standing balance support: Bilateral upper extremity supported;During functional activity Standing balance-Leahy Scale: Poor Standing balance comment: Reliant on BUE support                            Cognition Arousal/Alertness: Awake/alert Behavior During Therapy: WFL for tasks assessed/performed Overall Cognitive Status: Within Functional Limits for tasks assessed                                        Exercises Total Joint Exercises Ankle Circles/Pumps: AROM;Both;5 reps;Supine Quad Sets: AROM;Right;5 reps;Supine Towel Squeeze: AROM;Both;5 reps;Supine Short Arc Quad: AAROM;Right;5 reps;Supine Heel Slides: AAROM;Right;5 reps;Supine Hip ABduction/ADduction: AAROM;Right;5 reps;Supine Straight Leg Raises: AROM;Right;5 reps;Supine Goniometric ROM: knee  aarom ~5-75*, limited by pain and stiffness    General Comments        Pertinent Vitals/Pain Pain Assessment: 0-10 Pain Score: 3  Pain Location: R knee,  back Pain Descriptors / Indicators: Operative site guarding;Sore Pain Intervention(s): Limited activity within patient's tolerance;Monitored during session;Repositioned    Home Living                      Prior Function            PT Goals (current goals can now be found in the care plan section) Acute Rehab PT Goals Patient Stated Goal: return to  independence (eventually playing pickleball) PT Goal Formulation: With patient Time For Goal Achievement: 05/09/20 Potential to Achieve Goals: Good Progress towards PT goals: Progressing toward goals    Frequency    7X/week      PT Plan Current plan remains appropriate    Co-evaluation              AM-PAC PT "6 Clicks" Mobility   Outcome Measure  Help needed turning from your back to your side while in a flat bed without using bedrails?: None Help needed moving from lying on your back to sitting on the side of a flat bed without using bedrails?: None Help needed moving to and from a bed to a chair (including a wheelchair)?: A Little Help needed standing up from a chair using your arms (e.g., wheelchair or bedside chair)?: A Little Help needed to walk in hospital room?: A Little Help needed climbing 3-5 steps with a railing? : A Little 6 Click Score: 20    End of Session Equipment Utilized During Treatment: Gait belt Activity Tolerance: Patient tolerated treatment well Patient left: in bed;with call bell/phone within reach Nurse Communication: Mobility status PT Visit Diagnosis: Other abnormalities of gait and mobility (R26.89);Pain Pain - Right/Left: Right Pain - part of body: Knee     Time: 0911-0955 PT Time Calculation (min) (ACUTE ONLY): 44 min  Charges:  $Gait Training: 23-37 mins $Therapeutic Exercise: 8-22 mins                     Stacie Glaze, PT Acute Rehabilitation Services Pager [phone]  Office [phone]    Roxine Caddy E Ruffin Pyo 04/26/2020, 12:13 PM

## 2020-04-26 NOTE — Progress Notes (Signed)
  Subjective: Christina [name] is a 60 y.o. female s/p right TKA.  They are POD1.  Pt's pain is controlled.  Pt denies numbness/tingling/weakness.  Pt has ambulated with some difficulty.  Was able to walk 150 feet yesterday in PT.  She slept well last night.  Ready for discharge home according to her. Denies dizziness/lightheadedness/nausea   Objective: Vital signs in last 24 hours: Temp:  [97.5 F (36.4 C)-98.3 F (36.8 C)] 98.3 F (36.8 C) (03/09 0739) Pulse Rate:  [63-83] 63 (03/09 0739) Resp:  [11-20] 17 (03/09 0739) BP: (92-140)/(67-91) 104/77 (03/09 0739) SpO2:  [93 %-100 %] 100 % (03/09 0739)  Intake/Output from previous day: 03/08 0701 - 03/09 0700 In: 1800 [I.V.:1600; IV Piggyback:200] Out: 510 [Urine:500; Blood:10] Intake/Output this shift: No intake/output data recorded.  Exam:  No gross blood or drainage overlying the dressing 1+ DP pulse Sensation intact distally in the right foot Able to dorsiflex and plantarflex the right foot Able to perform SLR of RLE   Labs: No results for input(s): HGB in the last 72 hours. No results for input(s): WBC, RBC, HCT, PLT in the last 72 hours. No results for input(s): NA, K, CL, CO2, BUN, CREATININE, GLUCOSE, CALCIUM in the last 72 hours. No results for input(s): LABPT, INR in the last 72 hours.  Assessment/Plan: Pt is POD1 s/p right TKA.    -Plan to discharge to home today or tomorrow pending patient's pain and PT eval. If she can do stairs in PT, will discharge her home late this morning or early afternoon.  Patient has been using cpm machine and has machine at home  -WBAT with a walker    Christina [name] 04/26/2020, 10:54 AM

## 2020-04-26 NOTE — Op Note (Signed)
NAME: [place], [place] RECORD NO: [id number] ACCOUNT NO: [id number] DATE OF BIRTH: 1960-06-23 FACILITY: [facility] LOCATION: [facility] PHYSICIAN: Yetta Barre. Marlou Sa, MD  Operative Report   DATE OF PROCEDURE: 04/25/2020  PREOPERATIVE DIAGNOSIS:  Right knee arthritis.  POSTOPERATIVE DIAGNOSIS:  Right knee arthritis.  PROCEDURE:  Right total knee replacement using Stryker press-fit size 2 femur, posterior cruciate retaining size 2 tibia, 11 mm polyethylene insert, deep-dished posterior cruciate retaining, and 29 mm 3-peg cemented patella.  SURGEON:  Yetta Barre. Marlou Sa, MD  ASSISTANT:  Annie Main, PA  INDICATIONS:  The patient is a 60 year old patient with end-stage right knee arthritis, who presents for operative management after explanation of risks and benefits.  DESCRIPTION OF PROCEDURE:  The patient was brought to the operating room where spinal anesthetic was induced.  Preoperative antibiotics were administered.  Timeout was called.  Right leg was pre-scrubbed with alcohol, Betadine, and allowed to air dry;  prepped with DuraPrep solution; and draped in a sterile manner.  Charlie Pitter was used to cover the operative field.  The patient had about a 7-8 degree flexion contracture in the right knee, which was best appreciated when she was relaxed and asleep.  Right  leg was elevated and exsanguinated with the Esmarch wrap, tourniquet inflated.  Anterior approach to the knee was made.  Skin and subcutaneous tissue were sharply divided.  Median parapatellar approach was made, marked with #1 Vicryl suture.  The patella  was everted.  Lateral patellofemoral ligament was released.  Fat pad was partially excised.  Minimal medial soft tissue dissection was performed.  The patient had severe end-stage arthritis in the patellofemoral joint.  The actual lateral facet of the  patella was worn, very thin, less than 10 mm at its lateral aspect.  Eburnated bone was present.  There were also full-thickness chondral defects  measuring about 1.5 cm on both the medial and lateral femoral condyles.  Patella was everted.  The anterior  horn of the lateral meniscus was released.  ACL was released.  PCL was maintained.  The intramedullary alignment was then used to make a cut of 9 mm off the least affected lateral tibial plateau.  Collaterals were protected along with the posterior  neurovascular structures.  Bone quality was excellent.  Intramedullary alignment then used to cut the femur in 5 degrees of valgus, 8 mm cut initially with 9 mm spacer placed.  The patient did not achieve quite full extension, so 2 more millimeters were  taken, and the patient did achieve full extension with a 9 mm spacer.  Good stability was present.  The femur was sized to a size 2.  Tibia was also sized to a size 2.  Anterior, posterior, and chamfer cuts were made, and the tibia was then keel punched.   With trial femur and tibia in position, the patient had good stability in full extension with about 1-2 degrees of hyperextension with a 9 mm spacer.  The patella tracked well after we cut that patella down from about 18 mm down to 10 mm.  We centered  the patella more medially in order to achieve best bony contact.  With trial patella in place, the patient had excellent patellar tracking, good alignment, and very good stability.  Trial components were removed.  Final preparation of the tibia was made.   A thorough irrigation was performed with pulsatile irrigation followed by TXA, which was allowed to sit in the knee for 3 minutes along with the IrriSept.  Vancomycin powder placed  into the intramedullary canal of the tibia.  Tibia was tapped into  position, femur tapped into position, 9 mm spacer placed, but there was some laxity present, which for this active individual was thought to be slightly too much.  Placed in an 11 mm poly instead, which gave her near full extension, but improved  stability for her active lifestyle.  The patella was cemented  into position with excess cement removed.  Tourniquet released.  Bleeding points encountered were controlled using electrocautery.  Thorough irrigation again performed using irrigating  solution and IrriSept.  Arthrotomy closed over a drain using #1 Vicryl suture.  The IrriSept solution used to irrigate out the joint and then vancomycin powder placed inside the joint and the arthrotomy was closed.  A solution of Marcaine, morphine,  clonidine injected into the knee joint after this.  Thorough irrigation again performed superior to the arthrotomy, which was then closed over vancomycin powder using 0 Vicryl suture, 2-0 Vicryl suture, and 3-0 Monocryl.  Steri-Strips and Aquacel  dressing applied.  The patient tolerated the procedure well without immediate complications.  Luke's assistance was required at all times for opening, closing, mobilization of the tissue.  His assistance was a medical necessity.   [facility] D: 04/25/2020 10:34:11 am T: 04/25/2020 2:55:00 pm  JOB: [id number]/ [id number]

## 2020-04-26 NOTE — TOC Transition Note (Signed)
Transition of Care [facility]) - CM/SW Discharge Note   Patient Details  Name: Christina [name] MRN: [id number] Date of Birth: [birth year]  [facility] of [facility]) CM/SW Contact:  Sharin Mons, RN Phone Number: [phone] 04/26/2020, 12:52 PM   Clinical Narrative:    Patient will DC to: home Anticipated DC date: 04/26/2020 Family notified: yes Transport by: car         s/p R TKA on 3/8. PMH includes HTN  Per MD patient ready for DC today . RN, patient, and patient's family notified of DC.  Pt without Rx med concerns.Post hospital f/u noted on AVS  Medi equip/ [name] made aware of d/c plan to delivery CPM to pt's home.   RNCM will sign off for now as intervention is no longer needed. Please consult [place] again if new needs arise.   Final next level of care: [place] Barriers to Discharge: No Barriers Identified   Patient Goals and CMS Choice     Choice offered to / list presented to : Patient  Discharge Placement                       Discharge Plan and Services                          HH Arranged: PT [place] Agency: [facility] Date [place]: 04/26/20 Time [place]: 1252 Representative spoke with at [place]: [name]  Social Determinants of Health ([place]) Interventions     Readmission Risk Interventions No flowsheet data found.

## 2020-04-26 NOTE — Plan of Care (Signed)
  Problem: Health Behavior/Discharge Planning: Goal: Ability to manage health-related needs will improve Outcome: Adequate for Discharge   Problem: Activity: Goal: Risk for activity intolerance will decrease Outcome: Adequate for Discharge   Problem: Pain Managment: Goal: General experience of comfort will improve Outcome: Adequate for Discharge   Problem: Safety: Goal: Ability to remain free from injury will improve Outcome: Adequate for Discharge   

## 2020-04-26 NOTE — Progress Notes (Signed)
  Subjective: Patient doing moderately well last p.m.  Was having pain and issues with putting her foot in the blue cradle boot for full extension.  Was bending well.  Was able to ambulate with therapy 150 feet yesterday.   Objective: Vital signs in last 24 hours: Temp:  [97 F (36.1 C)-98.3 F (36.8 C)] 97.7 F (36.5 C) (03/09 0630) Pulse Rate:  [68-83] 77 (03/09 0630) Resp:  [11-20] 18 (03/08 2022) BP: (92-140)/(61-91) 99/68 (03/09 0630) SpO2:  [93 %-99 %] 98 % (03/09 0630)  Intake/Output from previous day: 03/08 0701 - 03/09 0700 In: 1800 [I.V.:1600; IV Piggyback:200] Out: 510 [Urine:500; Blood:10] Intake/Output this shift: No intake/output data recorded.  Exam:  Dorsiflexion/Plantar flexion intact Compartment soft  Labs: No results for input(s): HGB in the last 72 hours. No results for input(s): WBC, RBC, HCT, PLT in the last 72 hours. No results for input(s): NA, K, CL, CO2, BUN, CREATININE, GLUCOSE, CALCIUM in the last 72 hours. No results for input(s): LABPT, INR in the last 72 hours.  Assessment/Plan: Plan at this time is physical therapy this morning to work on stairs.  She will have home health physical therapy to help her work on full extension.  She may be having some issues with her back that are exacerbated by the blue cradle boot.  I discussed with her at length goals of therapy which essentially are to achieve flexion which she has done very nicely at this time with 90 degrees of flexion easily.  I think extension may be a little bit more difficult for her but I do not think it something that she needs to torture herself with all the time now.  Working on full extension about 20 to 30 minutes 3 times a day should be sufficient to achieve a functional extension range of motion.  Plan on discharge late this morning early this afternoon after physical therapy works with the patient on stairs.  She does have 5 stairs at home.   G Scott Dean 04/26/2020, 7:22 AM

## 2020-04-29 ENCOUNTER — Encounter: Payer: Self-pay | Admitting: Orthopedic Surgery

## 2020-04-29 DIAGNOSIS — M545 Low back pain, unspecified: Secondary | ICD-10-CM

## 2020-05-01 ENCOUNTER — Other Ambulatory Visit: Payer: Self-pay | Admitting: Surgical

## 2020-05-01 ENCOUNTER — Telehealth: Payer: Self-pay

## 2020-05-01 ENCOUNTER — Encounter: Payer: Self-pay | Admitting: Orthopedic Surgery

## 2020-05-01 MED ORDER — OXYCODONE-ACETAMINOPHEN 5-325 MG PO TABS: 1.0000 | ORAL_TABLET | Freq: Four times a day (QID) | ORAL | 0 refills | Status: DC | PRN

## 2020-05-01 NOTE — Telephone Encounter (Signed)
Sent in Refill

## 2020-05-01 NOTE — Telephone Encounter (Signed)
thx

## 2020-05-01 NOTE — Telephone Encounter (Signed)
Patient called she stated one of her doctors prescribed her flexeril and referred her to physical therapy for her back, she is requesting a refill for oxycodone call back:

## 2020-05-02 ENCOUNTER — Encounter: Payer: Self-pay | Admitting: Orthopedic Surgery

## 2020-05-02 NOTE — Telephone Encounter (Signed)
Not unexpected

## 2020-05-02 NOTE — Telephone Encounter (Signed)
Sure see if he will see her would be good

## 2020-05-03 ENCOUNTER — Encounter: Payer: Self-pay | Admitting: Orthopedic Surgery

## 2020-05-03 ENCOUNTER — Other Ambulatory Visit: Payer: Self-pay | Admitting: Surgical

## 2020-05-03 MED ORDER — OXYCODONE-ACETAMINOPHEN 5-325 MG PO TABS
1.0000 | ORAL_TABLET | Freq: Four times a day (QID) | ORAL | 0 refills | Status: DC | PRN
Start: 2020-05-03 — End: 2020-06-12

## 2020-05-03 NOTE — Telephone Encounter (Signed)
Sent in

## 2020-05-03 NOTE — Telephone Encounter (Signed)
Please advise 

## 2020-05-04 NOTE — Telephone Encounter (Signed)
Med refill was sent in yesterday

## 2020-05-04 NOTE — Telephone Encounter (Signed)
Yes for 7 more days

## 2020-05-07 ENCOUNTER — Other Ambulatory Visit: Payer: Self-pay | Admitting: Surgical

## 2020-05-07 ENCOUNTER — Encounter: Payer: Self-pay | Admitting: Orthopedic Surgery

## 2020-05-08 ENCOUNTER — Other Ambulatory Visit: Payer: Self-pay | Admitting: Surgical

## 2020-05-08 MED ORDER — CELECOXIB 100 MG PO CAPS: 100.0000 mg | ORAL_CAPSULE | Freq: Two times a day (BID) | ORAL | 1 refills | Status: DC

## 2020-05-08 NOTE — Telephone Encounter (Signed)
I will send in.

## 2020-05-08 NOTE — Telephone Encounter (Signed)
Please advise 

## 2020-05-08 NOTE — Telephone Encounter (Signed)
Discontinue toradol and switch to celebrex 100mg  BID

## 2020-05-09 NOTE — Discharge Summary (Signed)
Physician Discharge Summary      Patient ID: Christina [name] MRN: [id number] DOB/AGE: January 22, 1961 60 y.o.  Admit date: 04/25/2020 Discharge date: 04/26/2020  Admission Diagnoses:  Active Problems:   S/P total knee arthroplasty, right   Discharge Diagnoses:  Same  Surgeries: Procedure(s): RIGHT TOTAL KNEE ARTHROPLASTY on 04/25/2020   Consultants:   Discharged Condition: Stable  Hospital Course: Christina [name] is an 60 y.o. female who was admitted 04/25/2020 with a chief complaint of right knee pain, and found to have a diagnosis of right knee osteoarthritis.  They were brought to the operating room on 04/25/2020 and underwent the above named procedures.  Pt awoke from anesthesia without complication and was transferred to the floor. On POD1, patient's pain was controlled and she was able to ambulate about 150 feet on POD 0.  She continued to perform well in therapy on POD1 and was able to navigate steps without any significant difficulty or any lightheadedness/dizziness.  She was discharged home on postop day 1..  Pt will f/u with Dr. Marlou Sa in clinic in ~2 weeks.   Antibiotics given:  Anti-infectives (From admission, onward)   Start     Dose/Rate Route Frequency Ordered Stop   04/25/20 1700  ceFAZolin (ANCEF) IVPB 1 g/50 mL premix        1 g 100 mL/hr over 30 Minutes Intravenous Every 8 hours 04/25/20 1626 04/25/20 2223   04/25/20 0600  ceFAZolin (ANCEF) IVPB 2g/100 mL premix        2 g 200 mL/hr over 30 Minutes Intravenous On call to O.R. 04/25/20 3329 04/25/20 0749    .  Recent vital signs:  Vitals:   04/26/20 0630 04/26/20 0739  BP: 99/68 104/77  Pulse: 77 63  Resp:  17  Temp: 97.7 F (36.5 C) 98.3 F (36.8 C)  SpO2: 98% 100%    Recent laboratory studies:  Results for orders placed or performed during the hospital encounter of 04/21/20  SARS CORONAVIRUS 2 (TAT 6-24 HRS) Nasopharyngeal Nasopharyngeal Swab   Specimen: Nasopharyngeal Swab  Result Value Ref Range   SARS  Coronavirus 2 NEGATIVE NEGATIVE    Discharge Medications:   Allergies as of 04/26/2020   No Known Allergies     Medication List    STOP taking these medications   aspirin 325 MG EC tablet Replaced by: aspirin 81 MG chewable tablet   ibuprofen 200 MG tablet Commonly known as: ADVIL   predniSONE 20 MG tablet Commonly known as: DELTASONE     TAKE these medications   aspirin 81 MG chewable tablet Chew 1 tablet (81 mg total) by mouth 2 (two) times daily. Replaces: aspirin 325 MG EC tablet   cetirizine 10 MG tablet Commonly known as: ZYRTEC TAKE 1 TO 2 TABLETS BY MOUTH DAILY AS NEEDED What changed:   how much to take  how to take this  when to take this  additional instructions   doxylamine (Sleep) 25 MG tablet Commonly known as: UNISOM Take 25 mg by mouth at bedtime.   ipratropium 0.06 % nasal spray Commonly known as: ATROVENT USE 2 SPRAYS IN EACH NOSTRIL THREE TIMES DAILY What changed: See the new instructions.   ketorolac 10 MG tablet Commonly known as: TORADOL Take 1 tablet (10 mg total) by mouth every 8 (eight) hours as needed.   levocetirizine 5 MG tablet Commonly known as: XYZAL Take 1 tablet (5 mg total) by mouth every evening. What changed:   when to take this  reasons to take  this   losartan 50 MG tablet Commonly known as: COZAAR TAKE 1 TABLET(50 MG) BY MOUTH DAILY What changed: See the new instructions.   methocarbamol 500 MG tablet Commonly known as: ROBAXIN Take 1 tablet (500 mg total) by mouth every 8 (eight) hours as needed for muscle spasms.   oxybutynin 15 MG 24 hr tablet Commonly known as: DITROPAN XL Take 15 mg by mouth daily as needed (urinary frequency).   PROBIOTIC PO Take 1 tablet by mouth daily.   Vitamin D 50 MCG (2000 UT) tablet Take 2,000 Units by mouth daily.       Diagnostic Studies: No results found.  Disposition: Discharge disposition: 01-Home or Self Care       Discharge Instructions    Call MD / Call  911   Complete by: As directed    If you experience chest pain or shortness of breath, CALL 911 and be transported to the hospital emergency room.  If you develope a fever above 101 F, pus (white drainage) or increased drainage or redness at the wound, or calf pain, call your surgeon's office.   Constipation Prevention   Complete by: As directed    Drink plenty of fluids.  Prune juice may be helpful.  You may use a stool softener, such as Colace (over the counter) 100 mg twice a day.  Use MiraLax (over the counter) for constipation as needed.   Diet - low sodium heart healthy   Complete by: As directed    Discharge instructions   Complete by: As directed    You may shower, dressing is waterproof.  Do not remove the dressing, we will remove it at your first post-op appointment.  Do not take a bath or soak the knee in a tub or pool.  You may weightbear as you can tolerate on the operative leg with a walker.  Continue using the CPM machine 3 times per day for one hour each time, increasing the degrees of range of motion daily.  Helpful to use ice machine for 30 mins after CPM machine to help with pain after CPM. Do not use ice machine longer than 30 mins at a time.  Use the blue cradle boot under your heel to work on getting your leg straight (use this 3 times per day for 20-30 mins at a time).  Do NOT put a pillow under your knee.  You will follow-up with Dr. Marlou Sa in the clinic in 2 weeks at your given appointment date.  Call the office with any questions or concerns in the meantime.  Dental Antibiotics:  In most cases prophylactic antibiotics for Dental procdeures after total joint surgery are not necessary.  Exceptions are as follows:  1. History of prior total joint infection  2. Severely immunocompromised (Organ Transplant, cancer chemotherapy, Rheumatoid biologic meds such as [place])  3. Poorly controlled diabetes (A1C &gt; 8.0, blood glucose over 200)  If you have one of these  conditions, contact your surgeon for an antibiotic prescription, prior to your dental procedure.   Increase activity slowly as tolerated   Complete by: As directed        Follow-up Information    [facility].   Specialty: [place] Why: home health services will be provided by [facility], start of care within 48 hrs Contact information: [place] [address] [phone]        [facility]. Go on 05/10/2020.  Specialty: Orthopedic Surgery Why: 1:30 pm with Dr. Gita Kudo information: [place] [address] [phone]               Signed: Donella Stade 05/09/2020, 10:29 AM

## 2020-05-10 ENCOUNTER — Encounter: Payer: Self-pay | Admitting: Orthopedic Surgery

## 2020-05-10 ENCOUNTER — Other Ambulatory Visit: Payer: Self-pay

## 2020-05-10 ENCOUNTER — Ambulatory Visit (INDEPENDENT_AMBULATORY_CARE_PROVIDER_SITE_OTHER): Payer: 59

## 2020-05-10 ENCOUNTER — Ambulatory Visit (INDEPENDENT_AMBULATORY_CARE_PROVIDER_SITE_OTHER): Payer: 59 | Admitting: Orthopedic Surgery

## 2020-05-10 DIAGNOSIS — Z96651 Presence of right artificial knee joint: Secondary | ICD-10-CM

## 2020-05-10 NOTE — Progress Notes (Signed)
Post-Op Visit Note   Patient: Christina [name]           Date of Birth: [birth year]           MRN: [id number] Visit Date: 05/10/2020 PCP: Burnis Medin, MD   Assessment & Plan:  Chief Complaint:  Chief Complaint  Patient presents with  . Right Knee - Routine Post Op   Visit Diagnoses:  1. Status post total right knee replacement     Plan: Christina [name] is a 59 year old patient underwent right total knee replacement 04/25/2020.  She is doing well from her knee perspective but is having a lot of pain with her back.  Overall her back has started to turn the corner.  She has been doing a home exercise program.  She has MRI scan of the back next week.  She has been walking the dog.  Home health physical therapy notes are reviewed and she is done well with that.  On examination no calf tenderness negative Homans.  She has range of motion from about 3-95 which is excellent.  Knee does feel stable in extension and flexion to varus and valgus stress.  She has about a 10 degree extensor lag.  Patella was very thin but looks good on x-ray.  Plan at this time is per patient request she wants to work on her home therapy at home.  I think with her level of fitness and history of exercise but that would be fine.  Encouraged her to avoid the steps for the next month in terms of doing steps for working out just to try to diminish the chance of having inferior pole patella fracture.  I think stationary bike would be good along with neighborhood walking as pain allows.  I do want her to work on closed chain quad strengthening exercises.  Follow-up in 4 weeks for clinical recheck.  We will call her with results of the MRI scan and get her set up for epidural steroid injections to follow.  Follow-Up Instructions: Return in about 4 weeks (around 06/07/2020).   Orders:  Orders Placed This Encounter  Procedures  . XR Knee 1-2 Views Right   No orders of the defined types were placed in this encounter.   Imaging: XR Knee  1-2 Views Right  Result Date: 05/10/2020 AP lateral right knee reviewed.  Total knee prosthesis in good position alignment with no complicating features.   PMFS History: Patient Active Problem List   Diagnosis Date Noted  . 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 Degenerative arthritis of knee, bilateral 12/31/2019  . Patellofemoral arthritis of left knee 03/10/2019  . Acute medial meniscus tear of left knee 11/19/2018  . Sprain of medial collateral ligament of left knee 09/01/2018  . Neck strain, initial encounter 08/28/2018  . Degenerative disc disease, cervical 08/28/2018  . Left lumbar radiculopathy 03/05/2018  . Right lateral epicondylitis 10/30/2017  . SI (sacroiliac) joint dysfunction 10/30/2017  . Nonallopathic lesion of sacral region 10/30/2017  . Nonallopathic lesion of lumbar region 10/30/2017  . Nonallopathic lesion of thoracic region 10/30/2017  . History of recurrent UTIs 04/11/2017  . 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 Allergic rhinitis 10/29/2016   Past Medical History:  Diagnosis Date  . Arthritis   . Chicken pox   . HLD (hyperlipidemia)    diet controlled  . Hypertension   . Recurrent upper respiratory infection (URI)   . Seasonal allergies     Family History  Problem Relation Age of  Onset  . Cancer Mother   . Hyperlipidemia Mother   . Hypertension Mother   . Stroke Mother   . Breast cancer Mother   . Cancer Father   . Prostate cancer Father   . Hypertension Maternal Grandmother   . Hypertension Paternal Grandmother   . Stroke Paternal Grandmother   . Allergic rhinitis Neg Hx   . Asthma Neg Hx     Past Surgical History:  Procedure Laterality Date  . BLADDER SUSPENSION    . CHOLECYSTECTOMY    . COLONOSCOPY    . TOTAL KNEE ARTHROPLASTY Right 04/25/2020   Procedure: RIGHT TOTAL KNEE ARTHROPLASTY;  Surgeon: Meredith Pel, MD;  Location: [facility];  Service: Orthopedics;  Laterality: Right;  . WISDOM TOOTH EXTRACTION      Social History   Occupational History  . Occupation: retired  Tobacco Use  . Smoking status: Former Research scientist (life sciences)  . Smokeless tobacco: Never Used  Vaping Use  . Vaping Use: Never used  Substance and Sexual Activity  . Alcohol use: Yes    Comment: occasionally  . Drug use: No  . Sexual activity: Yes    Partners: Male    Birth control/protection: Post-menopausal

## 2020-05-13 ENCOUNTER — Encounter: Payer: Self-pay | Admitting: Orthopedic Surgery

## 2020-05-13 ENCOUNTER — Other Ambulatory Visit: Payer: Self-pay | Admitting: Surgical

## 2020-05-15 ENCOUNTER — Telehealth: Payer: Self-pay | Admitting: Physical Medicine and Rehabilitation

## 2020-05-15 DIAGNOSIS — M1711 Unilateral primary osteoarthritis, right knee: Secondary | ICD-10-CM

## 2020-05-15 NOTE — Telephone Encounter (Signed)
Patient called stating Dr. Marlou Sa wants her to see Dr. Ernestina Patches for her back. Please call patient at [phone].

## 2020-05-15 NOTE — Telephone Encounter (Signed)
Called patient to advise that once MRI has been completed, a referral has been placed and reviewed, and auth obtained then we can call to schedule. She wanted to make sure Dr. Ernestina Patches was aware that she sent all her notes from previous injections at [facility] to both Dr. Marlou Sa and Dr. Ernestina Patches and that Dr. Ernestina Patches would need to review these to see what she has had in the past so "we are not duplicating."

## 2020-05-16 ENCOUNTER — Other Ambulatory Visit: Payer: Self-pay

## 2020-05-16 ENCOUNTER — Ambulatory Visit
Admission: RE | Admit: 2020-05-16 | Discharge: 2020-05-16 | Disposition: A | Discharge status: Home / Self Care | Payer: 59 | Source: Ambulatory Visit | Attending: Orthopedic Surgery | Admitting: Orthopedic Surgery

## 2020-05-16 DIAGNOSIS — M545 Low back pain, unspecified: Secondary | ICD-10-CM

## 2020-05-16 NOTE — Telephone Encounter (Signed)
Do you have notes from [facility] for this patient?

## 2020-05-16 NOTE — Telephone Encounter (Signed)
Im sorry I do not

## 2020-05-16 NOTE — Telephone Encounter (Signed)
Please confirm that the notes from [facility] orthopedic have been obtained.  If they have not been obtained and I have not been able to see them please work with her on getting those to [place].  I agree with her completely that I should not see her unless we see those prior notes.

## 2020-05-18 ENCOUNTER — Encounter: Payer: Self-pay | Admitting: Orthopedic Surgery

## 2020-05-20 ENCOUNTER — Other Ambulatory Visit: Payer: Self-pay | Admitting: Surgical

## 2020-05-20 ENCOUNTER — Encounter: Payer: Self-pay | Admitting: Orthopedic Surgery

## 2020-05-22 NOTE — Telephone Encounter (Signed)
Sure why not

## 2020-05-24 ENCOUNTER — Ambulatory Visit (INDEPENDENT_AMBULATORY_CARE_PROVIDER_SITE_OTHER): Payer: 59 | Admitting: Orthopedic Surgery

## 2020-05-24 DIAGNOSIS — M545 Low back pain, unspecified: Secondary | ICD-10-CM

## 2020-05-26 ENCOUNTER — Other Ambulatory Visit: Payer: Self-pay

## 2020-05-26 ENCOUNTER — Encounter: Payer: Self-pay | Admitting: Orthopedic Surgery

## 2020-05-26 DIAGNOSIS — Z96651 Presence of right artificial knee joint: Secondary | ICD-10-CM

## 2020-05-28 ENCOUNTER — Encounter: Payer: Self-pay | Admitting: Orthopedic Surgery

## 2020-05-28 NOTE — Progress Notes (Signed)
Post-Op Visit Note   Patient: Christina [name]           Date of Birth: [birth year]           MRN: [id number] Visit Date: 05/24/2020 PCP: Burnis Medin, MD   Assessment & Plan:  Chief Complaint:  Chief Complaint  Patient presents with  . Right Knee - Pain   Visit Diagnoses:  1. Low back pain, unspecified back pain laterality, unspecified chronicity, unspecified whether sciatica present     Plan: Christina [name] is a patient underwent right total knee replacement 04/25/2020.  Concerned about a knot behind her knee.  She does not have a CPM.  She was noted to have nerve root impingement on the left-hand side from a bulging disc.  MRI scan is discussed.  I like to refer her to Dr. Ernestina [name] for [facility] for left-sided radiculopathy.  I think this is a different problem than the facet arthritis that she had ablation for in January.  Regarding the knee she is a little concerned about a knot on the anterior aspect of the knee right where the quad mechanism was repaired.  Her quad is intact but she has a slight lag due to patellar thickness.  Extremely thin patella at the time of surgery and her quad may need a little bit of time to reset the Blix curve to obtain full extension.  Follow-up in 4 weeks for clinical recheck but sooner than that with Dr. Ernestina [name] for L spine ESI.  Follow-Up Instructions: Return in about 4 weeks (around 06/21/2020).   Orders:  Orders Placed This Encounter  Procedures  . Ambulatory referral to Physical Medicine Rehab   No orders of the defined types were placed in this encounter.   Imaging: No results found.  PMFS History: Patient Active Problem List   Diagnosis Date Noted  . Arthritis of right knee   . 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 Degenerative arthritis of knee, bilateral 12/31/2019  . Patellofemoral arthritis of left knee 03/10/2019  . Acute medial meniscus tear of left knee 11/19/2018  . Sprain of medial collateral ligament of left knee 09/01/2018  . Neck  strain, initial encounter 08/28/2018  . Degenerative disc disease, cervical 08/28/2018  . Left lumbar radiculopathy 03/05/2018  . Right lateral epicondylitis 10/30/2017  . SI (sacroiliac) joint dysfunction 10/30/2017  . Nonallopathic lesion of sacral region 10/30/2017  . Nonallopathic lesion of lumbar region 10/30/2017  . Nonallopathic lesion of thoracic region 10/30/2017  . History of recurrent UTIs 04/11/2017  . 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 Allergic rhinitis 10/29/2016   Past Medical History:  Diagnosis Date  . Arthritis   . Chicken pox   . HLD (hyperlipidemia)    diet controlled  . Hypertension   . Recurrent upper respiratory infection (URI)   . Seasonal allergies     Family History  Problem Relation Age of Onset  . Cancer Mother   . Hyperlipidemia Mother   . Hypertension Mother   . Stroke Mother   . Breast cancer Mother   . Cancer Father   . Prostate cancer Father   . Hypertension Maternal Grandmother   . Hypertension Paternal Grandmother   . Stroke Paternal Grandmother   . Allergic rhinitis Neg Hx   . Asthma Neg Hx     Past Surgical History:  Procedure Laterality Date  . BLADDER SUSPENSION    . CHOLECYSTECTOMY    . COLONOSCOPY    . TOTAL KNEE ARTHROPLASTY Right 04/25/2020  Procedure: RIGHT TOTAL KNEE ARTHROPLASTY;  Surgeon: Meredith Pel, MD;  Location: [facility];  Service: Orthopedics;  Laterality: Right;  . WISDOM TOOTH EXTRACTION     Social History   Occupational History  . Occupation: retired  Tobacco Use  . Smoking status: Former Research scientist (life sciences)  . Smokeless tobacco: Never Used  Vaping Use  . Vaping Use: Never used  Substance and Sexual Activity  . Alcohol use: Yes    Comment: occasionally  . Drug use: No  . Sexual activity: Yes    Partners: Male    Birth control/protection: Post-menopausal

## 2020-05-30 ENCOUNTER — Encounter: Payer: Self-pay | Admitting: Orthopedic Surgery

## 2020-05-31 NOTE — Telephone Encounter (Signed)
Her plan works for me

## 2020-06-01 ENCOUNTER — Other Ambulatory Visit: Payer: Self-pay

## 2020-06-01 ENCOUNTER — Encounter: Payer: Self-pay | Admitting: Rehabilitative and Restorative Service Providers"

## 2020-06-01 ENCOUNTER — Ambulatory Visit (INDEPENDENT_AMBULATORY_CARE_PROVIDER_SITE_OTHER): Payer: 59 | Admitting: Rehabilitative and Restorative Service Providers"

## 2020-06-01 DIAGNOSIS — R262 Difficulty in walking, not elsewhere classified: Secondary | ICD-10-CM | POA: Diagnosis not present

## 2020-06-01 DIAGNOSIS — M25661 Stiffness of right knee, not elsewhere classified: Secondary | ICD-10-CM | POA: Diagnosis not present

## 2020-06-01 DIAGNOSIS — M6281 Muscle weakness (generalized): Secondary | ICD-10-CM | POA: Diagnosis not present

## 2020-06-01 DIAGNOSIS — M25561 Pain in right knee: Secondary | ICD-10-CM

## 2020-06-01 DIAGNOSIS — G8929 Other chronic pain: Secondary | ICD-10-CM

## 2020-06-01 DIAGNOSIS — R600 Localized edema: Secondary | ICD-10-CM

## 2020-06-01 NOTE — Therapy (Signed)
[facility] [place], [place], [address] Phone: [phone]   Fax:  [phone]  Physical Therapy Evaluation  Patient Details  Name: Christina [name] MRN: [id number] Date of Birth: 06/08/1960 Referring Provider (PT): Meredith Pel MD   Encounter Date: 06/01/2020   PT End of Session - 06/01/20 1520    Visit Number 1    Number of Visits 12    Date for PT Re-Evaluation 07/13/20    PT Start Time 1100    PT Stop Time 1143    PT Time Calculation (min) 43 min    Activity Tolerance Patient tolerated treatment well;No increased pain;Patient limited by fatigue    Behavior During Therapy [facility] for tasks assessed/performed           Past Medical History:  Diagnosis Date  . Arthritis   . Chicken pox   . HLD (hyperlipidemia)    diet controlled  . Hypertension   . Recurrent upper respiratory infection (URI)   . Seasonal allergies     Past Surgical History:  Procedure Laterality Date  . BLADDER SUSPENSION    . CHOLECYSTECTOMY    . COLONOSCOPY    . TOTAL KNEE ARTHROPLASTY Right 04/25/2020   Procedure: RIGHT TOTAL KNEE ARTHROPLASTY;  Surgeon: Meredith Pel, MD;  Location: [facility];  Service: Orthopedics;  Laterality: Right;  . WISDOM TOOTH EXTRACTION      There were no vitals filed for this visit.    Subjective Assessment - 06/01/20 1516    Subjective Christina [name] had a R TKA 04/26/18.  She wants to get back to yoga and other physically demanding activities.    Limitations Sitting;Standing;Walking    How long can you sit comfortably? Stiffness with sitting > 30 minutes    How long can you stand comfortably? Edema with much greater than an hour    How long can you walk comfortably? Not walking for exercise yet, just household distances    Patient Stated Goals Return to yoga and more physically demanding activities    Currently in Pain? No/denies    Multiple Pain Sites No              [facility] PT Assessment - 06/01/20 0001       Assessment   Medical Diagnosis R TKA    Referring Provider (PT) Meredith Pel MD    Onset Date/Surgical Date 04/25/20      Balance Screen   Has the patient fallen in the past 6 months No    Has the patient had a decrease in activity level because of a fear of falling?  No    Is the patient reluctant to leave their home because of a fear of falling?  No      Home Environment   Living Environment Private residence    Available Help at Discharge Family    Additional Comments Stairs      Prior Function   Level of [place] Retired    Leisure Very physically active      Cognition   Overall Cognitive Status Within Functional Limits for tasks assessed      Observation/Other Assessments   Focus on Therapeutic Outcomes (FOTO)  46 (Goal 70)      ROM / Strength   AROM / PROM / Strength AROM;Strength      AROM   Overall AROM  Deficits    AROM Assessment Site Knee    Right/Left Knee Left;Right  Right Knee Extension 0    Right Knee Flexion 120    Left Knee Extension 0    Left Knee Flexion 154      Strength   Overall Strength Comments Thigh girth (L/R in cm 15 cm proximal to the superior patellar pole): 45 cm/46 cm                      Objective measurements completed on examination: See above findings.       [facility] Adult PT Treatment/Exercise - 06/01/20 0001      Exercises   Exercises Knee/Hip      Knee/Hip Exercises: Stretches   Other Knee/Hip Stretches R knee flexion stretch with strap (supine) 10X 10 seconds    Other Knee/Hip Stretches Tailgate knee flexion AROM 10X 10 seconds      Knee/Hip Exercises: Aerobic   Recumbent Bike Seat 4 for 8 minutes (check seat next visit)      Knee/Hip Exercises: Seated   Long Arc Quad --   Attempted-quadriceps lag noted     Knee/Hip Exercises: Supine   Quad Sets Strengthening;Both;2 sets;10 reps;Limitations    Quad Sets Limitations 5 seconds hold                  PT  Education - 06/01/20 1518    Education Details Talked about expectations 6 and 12 weeks post-surgery and long-term expectations.  Started an appropriate HEP.    Person(s) Educated Patient    Methods Explanation;Demonstration;Tactile cues;Verbal cues;Handout    Comprehension Verbalized understanding;Tactile cues required;Returned demonstration;Need further instruction;Verbal cues required            PT Short Term Goals - 06/01/20 1524      PT SHORT TERM GOAL #1   Title Improve R knee flexion AROM to 125 degrees.    Time 4    Period Weeks    Status New    Target Date 06/29/20             PT Long Term Goals - 06/01/20 1524      PT LONG TERM GOAL #1   Title Christina [name] will report a FOTO score of 70 or better.    Baseline 46    Time 12    Period Weeks    Status New    Target Date 08/24/20      PT LONG TERM GOAL #2   Title Christina [name] will report R knee pain at consistently 0-3/10 on the Numeric Pain Rating Scale.    Baseline Still using pain meds (5/10)    Time 12    Period Weeks    Status New    Target Date 08/24/20      PT LONG TERM GOAL #3   Title Improve R quadriceps strength as assessed by functional-self-report and FOTO.    Time 12    Period Weeks    Status New    Target Date 08/24/20      PT LONG TERM GOAL #4   Title Christina [name] will be independent with her long-term HEP at [place].    Time 12    Period Weeks    Status New    Target Date 08/24/20                  Plan - 06/01/20 1521    Clinical Impression Statement Christina [name] is making great progress early post-TKA.  AROM is excellent.  Quadrices strength, proprioception and more sport-specific activities will be added to allow her to  return to [place] when approved by Dr. Marlou Sa.    Examination-Activity Limitations Sit;Sleep;Bed Mobility;Bend;Lift;Squat;Stairs;Locomotion Level;Carry;Stand    Examination-Participation Restrictions Yard Work;Community Activity    Stability/Clinical Decision Making  Stable/Uncomplicated    Clinical Decision Making Low    Rehab Potential Good    PT Frequency 1x / week    PT Duration 12 weeks    PT Treatment/Interventions ADLs/Self Care Home Management;Cryotherapy;Therapeutic activities;Stair training;Gait training;Therapeutic exercise;Balance training;Neuromuscular re-education;Patient/family education;Manual techniques;Vasopneumatic Device    PT Next Visit Plan Quadriceps strength and proprioception progressions    PT Home Exercise Plan Quadriceps sets and knee flexion activities    Consulted and Agree with Plan of Care Patient           Patient will benefit from skilled therapeutic intervention in order to improve the following deficits and impairments:  Abnormal gait,Decreased endurance,Decreased range of motion,Decreased strength,Difficulty walking,Increased edema,Impaired flexibility,Pain  Visit Diagnosis: Difficulty walking  Localized edema  Muscle weakness (generalized)  Stiffness of right knee, not elsewhere classified  Chronic pain of right knee     Problem List Patient Active Problem List   Diagnosis Date Noted  . Arthritis of right knee   . 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 Degenerative arthritis of knee, bilateral 12/31/2019  . Patellofemoral arthritis of left knee 03/10/2019  . Acute medial meniscus tear of left knee 11/19/2018  . Sprain of medial collateral ligament of left knee 09/01/2018  . Neck strain, initial encounter 08/28/2018  . Degenerative disc disease, cervical 08/28/2018  . Left lumbar radiculopathy 03/05/2018  . Right lateral epicondylitis 10/30/2017  . SI (sacroiliac) joint dysfunction 10/30/2017  . Nonallopathic lesion of sacral region 10/30/2017  . Nonallopathic lesion of lumbar region 10/30/2017  . Nonallopathic lesion of thoracic region 10/30/2017  . History of recurrent UTIs 04/11/2017  . 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 Allergic rhinitis 10/29/2016    Farley Ly  PT, MPT 06/01/2020, 3:28 PM  [facility] [address] [place], [place], [address] Phone: [phone]   Fax:  [phone]  Name: Kelin Nixon Felan MRN: [id number] Date of Birth: 03-09-1960

## 2020-06-01 NOTE — Patient Instructions (Signed)
Access Code: [email or web address] URL: [email or web address] Date: 06/01/2020 Prepared by: Vista Mink  Exercises Supine Quadricep Sets - 3-5 x daily - 7 x weekly - 3-5 sets - 10 reps - 5 seconds hold Seated Knee Flexion Extension AROM - 3-5 x daily - 7 x weekly - 1 sets - 1 reps - 3 minutes hold

## 2020-06-03 ENCOUNTER — Other Ambulatory Visit: Payer: Self-pay | Admitting: Surgical

## 2020-06-05 NOTE — Telephone Encounter (Signed)
Please advise 

## 2020-06-06 ENCOUNTER — Encounter: Payer: 59 | Admitting: Physical Therapy

## 2020-06-07 ENCOUNTER — Ambulatory Visit: Payer: 59 | Admitting: Orthopedic Surgery

## 2020-06-12 ENCOUNTER — Other Ambulatory Visit: Payer: Self-pay

## 2020-06-12 ENCOUNTER — Other Ambulatory Visit: Payer: Self-pay | Admitting: Surgical

## 2020-06-12 ENCOUNTER — Ambulatory Visit (INDEPENDENT_AMBULATORY_CARE_PROVIDER_SITE_OTHER): Payer: 59 | Admitting: Internal Medicine

## 2020-06-12 ENCOUNTER — Encounter: Payer: Self-pay | Admitting: Internal Medicine

## 2020-06-12 VITALS — BP 146/80 | HR 92 | Temp 98.1°F | Ht 61.0 in | Wt 135.6 lb

## 2020-06-12 DIAGNOSIS — N898 Other specified noninflammatory disorders of vagina: Secondary | ICD-10-CM | POA: Diagnosis not present

## 2020-06-12 DIAGNOSIS — Z87440 Personal history of urinary (tract) infections: Secondary | ICD-10-CM

## 2020-06-12 DIAGNOSIS — N9089 Other specified noninflammatory disorders of vulva and perineum: Secondary | ICD-10-CM | POA: Diagnosis not present

## 2020-06-12 DIAGNOSIS — N765 Ulceration of vagina: Secondary | ICD-10-CM

## 2020-06-12 MED ORDER — LIDOCAINE VISCOUS HCL 2 % MT SOLN: OROMUCOSAL | 0 refills | Status: DC

## 2020-06-12 MED ORDER — DOXYCYCLINE HYCLATE 100 MG PO TABS: 100.0000 mg | ORAL_TABLET | Freq: Two times a day (BID) | ORAL | 0 refills | Status: DC

## 2020-06-12 NOTE — Progress Notes (Signed)
Chief Complaint  Patient presents with  . Vaginal Injury    Patient complains of blister in vagina, x2 weeks    HPI: Christina [name] 60 y.o. come in for dysuria localized to the left vaginal labial area Denies any specific trauma or vaginal discharge states it has been sore for at least 2 weeks has put some Vaseline on it recently to see if it would help urination is very painful over that area. No fever abdominal pain had a knee replacement March 8 and has had good helpful rehab Is also had treatment for back pain and injections.  She has had a history of UTI but this is different. She might of treated for yeast infection before the surgery but nothing since. Has not improved and continues to be painful.  She does not have a mirror so cannot tell how it is changed. ROS: See pertinent positives and negatives per HPI. No risk for STI Past Medical History:  Diagnosis Date  . Arthritis   . Chicken pox   . HLD (hyperlipidemia)    diet controlled  . Hypertension   . Recurrent upper respiratory infection (URI)   . Seasonal allergies     Family History  Problem Relation Age of Onset  . Cancer Mother   . Hyperlipidemia Mother   . Hypertension Mother   . Stroke Mother   . Breast cancer Mother   . Cancer Father   . Prostate cancer Father   . Hypertension Maternal Grandmother   . Hypertension Paternal Grandmother   . Stroke Paternal Grandmother   . Allergic rhinitis Neg Hx   . Asthma Neg Hx     Social History   Socioeconomic History  . Marital status: Married    Spouse name: Not on file  . Number of children: Not on file  . Years of education: Not on file  . Highest education level: Not on file  Occupational History  . Occupation: retired  Tobacco Use  . Smoking status: Former Research scientist (life sciences)  . Smokeless tobacco: Never Used  Vaping Use  . Vaping Use: Never used  Substance and Sexual Activity  . Alcohol use: Yes    Comment: occasionally  . Drug use: No  . Sexual  activity: Yes    Partners: Male    Birth control/protection: Post-menopausal  Other Topics Concern  . Not on file  Social History Narrative  . Not on file   Social Determinants of Health   Financial Resource Strain: Not on file  Food Insecurity: Not on file  Transportation Needs: Not on file  Physical Activity: Not on file  Stress: Not on file  Social Connections: Not on file    Outpatient Medications Prior to Visit  Medication Sig Dispense Refill  . celecoxib (CELEBREX) 100 MG capsule Take 1 capsule (100 mg total) by mouth 2 (two) times daily. 60 capsule 1  . cetirizine (ZYRTEC) 10 MG tablet TAKE 1 TO 2 TABLETS BY MOUTH DAILY AS NEEDED (Patient taking differently: Take 10 mg by mouth daily.) 60 tablet 5  . Cholecalciferol (VITAMIN D) 50 MCG (2000 UT) tablet Take 2,000 Units by mouth daily.    Marland Kitchen ipratropium (ATROVENT) 0.06 % nasal spray USE 2 SPRAYS IN EACH NOSTRIL THREE TIMES DAILY (Patient taking differently: Place 2 sprays into both nostrils 3 (three) times daily as needed for rhinitis.) 15 mL 4  . levocetirizine (XYZAL) 5 MG tablet Take 1 tablet (5 mg total) by mouth every evening. (Patient taking differently: Take 5 mg  by mouth at bedtime as needed for allergies.) 30 tablet 5  . losartan (COZAAR) 50 MG tablet TAKE 1 TABLET(50 MG) BY MOUTH DAILY (Patient taking differently: Take 50 mg by mouth daily.) 90 tablet 3  . oxybutynin (DITROPAN XL) 15 MG 24 hr tablet Take 15 mg by mouth daily as needed (urinary frequency).    . Probiotic Product (PROBIOTIC PO) Take 1 tablet by mouth daily.    Marland Kitchen aspirin 81 MG chewable tablet Chew 1 tablet (81 mg total) by mouth 2 (two) times daily. 60 tablet 0  . doxylamine, Sleep, (UNISOM) 25 MG tablet Take 25 mg by mouth at bedtime.    . gabapentin (NEURONTIN) 300 MG capsule TAKE 1 CAPSULE(300 MG) BY MOUTH THREE TIMES DAILY 30 capsule 0  . ketorolac (TORADOL) 10 MG tablet Take 1 tablet (10 mg total) by mouth every 8 (eight) hours as needed. 15 tablet 0   . methocarbamol (ROBAXIN) 500 MG tablet Take 1 tablet (500 mg total) by mouth every 8 (eight) hours as needed for muscle spasms. 30 tablet 0  . oxyCODONE-acetaminophen (PERCOCET) 5-325 MG tablet Take 1 tablet by mouth every 6 (six) hours as needed for severe pain. 30 tablet 0   No facility-administered medications prior to visit.     EXAM:  BP (!) 146/80 (BP Location: Left Arm, Patient Position: Sitting, Cuff Size: Normal)   Pulse 92   Temp 98.1 F (36.7 C) (Oral)   Ht 5\' 1"  (1.549 m)   Wt 135 lb 9.6 oz (61.5 kg)   SpO2 98%   BMI 25.62 kg/m   Body mass index is 25.62 kg/m. Wt Readings from Last 3 Encounters:  06/12/20 135 lb 9.6 oz (61.5 kg)  04/20/20 145 lb 1.6 oz (65.8 kg)  02/15/20 143 lb (64.9 kg)    GENERAL: vitals reviewed and listed above, alert, oriented, appears well hydrated and in no acute distress HEENT: atraumatic, conjunctiva  clear, no obvious abnormalities on inspection of external nose and ears OP :masked  MS: moves all extremities without noticeable focal  abnormality well-healed scar right knee good range of motion External GU there is a large 2 and half centimeter about indurated palpable slightly raised ulcerated lesion with some cracking and slight blood but no pus that is tender.  Seems to be well-circumscribed.  Shotty inguinal nodes no significant adenopathy.  Rest of exam is normal.  Routine wound culture sent the swab PSYCH: pleasant and cooperative, no obvious depression or anxiety  BP Readings from Last 3 Encounters:  06/12/20 (!) 146/80  04/26/20 104/77  04/20/20 (!) 159/92   Urinalysis    Component Value Date/Time   COLORURINE YELLOW 04/20/2020 0945   APPEARANCEUR CLOUDY (A) 04/20/2020 0945   LABSPEC 1.023 04/20/2020 0945   PHURINE 7.0 04/20/2020 0945   GLUCOSEU NEGATIVE 04/20/2020 0945   GLUCOSEU NEGATIVE 06/10/2019 1022   HGBUR SMALL (A) 04/20/2020 0945   BILIRUBINUR NEGATIVE 04/20/2020 0945   BILIRUBINUR Negative 06/15/2019 0932    KETONESUR 5 (A) 04/20/2020 0945   PROTEINUR NEGATIVE 04/20/2020 0945   UROBILINOGEN 0.2 06/15/2019 0932   UROBILINOGEN 0.2 06/10/2019 1022   NITRITE NEGATIVE 04/20/2020 0945   LEUKOCYTESUR NEGATIVE 04/20/2020 0945     ASSESSMENT AND PLAN:  Discussed the following assessment and plan:  Vaginal lesion - Plan: WOUND CULTURE, Ambulatory referral to Gynecology  History of recurrent UTIs  Labial lesion - Plan: WOUND CULTURE, Ambulatory referral to Gynecology  Vaginal ulcer - Plan: Ambulatory referral to Gynecology Concerning large ulcerative palpable painful  lesion that is not an erosion not vesicular or bullous sent in doxycycline 1 twice a day for 7 days some viscous Xylocaine can use topical antibiotic to see if that helps relieve the pain As soon as possible GYN evaluation referral made  -Patient advised to return or notify health care team  if  new concerns arise.  Patient Instructions  Can use topical antibiotic to coat area . I can also send in  Viscous  Lidocaine  If needed with caution Take antibiotic and will be sending you to [facility] to check this area.    Standley Brooking. Havanah Nelms M.D.

## 2020-06-12 NOTE — Patient Instructions (Signed)
Can use topical antibiotic to coat area . I can also send in  Viscous  Lidocaine  If needed with caution Take antibiotic and will be sending you to [facility] to check this area.

## 2020-06-13 ENCOUNTER — Encounter: Payer: Self-pay | Admitting: Physical Medicine and Rehabilitation

## 2020-06-13 ENCOUNTER — Other Ambulatory Visit: Payer: Self-pay | Admitting: Surgical

## 2020-06-13 ENCOUNTER — Encounter: Payer: Self-pay | Admitting: Orthopedic Surgery

## 2020-06-13 ENCOUNTER — Ambulatory Visit: Payer: 59 | Admitting: Physical Medicine and Rehabilitation

## 2020-06-13 VITALS — BP 137/88 | HR 83

## 2020-06-13 DIAGNOSIS — M545 Low back pain, unspecified: Secondary | ICD-10-CM | POA: Diagnosis not present

## 2020-06-13 DIAGNOSIS — M25552 Pain in left hip: Secondary | ICD-10-CM

## 2020-06-13 DIAGNOSIS — G8929 Other chronic pain: Secondary | ICD-10-CM

## 2020-06-13 DIAGNOSIS — M5126 Other intervertebral disc displacement, lumbar region: Secondary | ICD-10-CM | POA: Diagnosis not present

## 2020-06-13 DIAGNOSIS — M47816 Spondylosis without myelopathy or radiculopathy, lumbar region: Secondary | ICD-10-CM | POA: Diagnosis not present

## 2020-06-13 MED ORDER — TRAMADOL HCL 50 MG PO TABS: 50.0000 mg | ORAL_TABLET | Freq: Every evening | ORAL | 0 refills | Status: DC | PRN

## 2020-06-13 NOTE — Progress Notes (Signed)
Pt state lower back pain mostly on her left side. Pt state climbing stairs, lifting and bending makes the pain worse. Pt state sitting for a long period of time makes the pain worse. Pt state she takes pain meds to help ease her pain. Pt state she coming for this OV she change Dr. Abbott Pao sate an RFA in January.  Numeric Pain Rating Scale and Functional Assessment Average Pain 0 Pain Right Now 0 My pain is intermittent, sharp, and aching Pain is worse with: walking, bending, standing, and some activites Pain improves with: rest and medication   In the last MONTH (on 0-10 scale) has pain interfered with the following?  1. General activity like being  able to carry out your everyday physical activities such as walking, climbing stairs, carrying groceries, or moving a chair?  Rating(3)  2. Relation with others like being able to carry out your usual social activities and roles such as  activities at home, at work and in your community. Rating(2)  3. Enjoyment of life such that you have  been bothered by emotional problems such as feeling anxious, depressed or irritable?  Rating(2)

## 2020-06-13 NOTE — Telephone Encounter (Signed)
Could try ultram or neurontin

## 2020-06-13 NOTE — Telephone Encounter (Signed)
Ultram sent thx

## 2020-06-14 ENCOUNTER — Other Ambulatory Visit: Payer: Self-pay | Admitting: Orthopedic Surgery

## 2020-06-14 MED ORDER — OXYCODONE-ACETAMINOPHEN 5-325 MG PO TABS: ORAL_TABLET | ORAL | 0 refills | Status: DC

## 2020-06-14 NOTE — Telephone Encounter (Signed)
Okay for 21 oxycodone to take at night.  Thanks

## 2020-06-14 NOTE — Telephone Encounter (Signed)
Not at 1 time

## 2020-06-15 ENCOUNTER — Encounter: Payer: Self-pay | Admitting: Obstetrics & Gynecology

## 2020-06-15 ENCOUNTER — Other Ambulatory Visit: Payer: Self-pay

## 2020-06-15 ENCOUNTER — Ambulatory Visit: Payer: 59 | Admitting: Obstetrics & Gynecology

## 2020-06-15 ENCOUNTER — Other Ambulatory Visit (HOSPITAL_COMMUNITY)
Admission: RE | Admit: 2020-06-15 | Discharge: 2020-06-15 | Disposition: A | Discharge status: Home / Self Care | Payer: 59 | Source: Ambulatory Visit | Attending: Obstetrics & Gynecology | Admitting: Obstetrics & Gynecology

## 2020-06-15 VITALS — BP 124/80 | Ht 61.0 in | Wt 138.0 lb

## 2020-06-15 DIAGNOSIS — N9089 Other specified noninflammatory disorders of vulva and perineum: Secondary | ICD-10-CM

## 2020-06-15 DIAGNOSIS — C519 Malignant neoplasm of vulva, unspecified: Secondary | ICD-10-CM

## 2020-06-15 LAB — SURGICAL PATHOLOGY

## 2020-06-15 NOTE — Progress Notes (Signed)
    Jadwiga Faidley [place] [birth year] [id number]        60 y.o.  [id number]   RP: Painful vulvovaginal lesion noticed x 2 weeks  HPI: Painful burning lesion with mild spotting at the vulvovaginal area posteriorly.  Patient noticed it x about 2 weeks.  Had Rt knee surgery and was on narcotics postop.   OB History  Gravida Para Term Preterm AB Living  4       2 4   SAB IAB Ectopic Multiple Live Births  2   0        # Outcome Date GA Lbr Len/2nd Weight Sex Delivery Anes PTL Lv  4 Gravida           3 Gravida           2 SAB           1 SAB             Past medical history,surgical history, problem list, medications, allergies, family history and social history were all reviewed and documented in the EPIC chart.   Directed ROS with pertinent positives and negatives documented in the history of present illness/assessment and plan.  Exam:  Vitals:   06/15/20 1354  BP: 124/80  Weight: 138 lb (62.6 kg)  Height: 5\' 1"  (1.549 m)   General appearance:  Normal    Gynecologic exam: Vulva:  Large solid elevated lesion about 3 x 3 cm with central ulceration, mild bleeding at the left vulva, perineum and post fourchette of the vagina Physical Exam Genitourinary:      Vulvar biopsy:  Verbal informed consent obtained.  Betadine prep.  Local anesthesia with Lidocaine 1%.  Punch biopsy.  Good hemostasis with a Vicryl 4-0 separate stitch and Silver Nitrate.   Assessment/Plan:  60 y.o. [id number]   1. Vulvar lesion Large raised solid tumor at the left posterior vulva/perineum/fourchette.  R/O vulvar cancer.  Counseling done on vulvar lesions.  Biopsy taken.  Post procedure precautions reviewed.  Management per Vulvar Bx results.    Princess Bruins MD, 2:12 PM 06/15/2020

## 2020-06-16 ENCOUNTER — Other Ambulatory Visit: Payer: Self-pay | Admitting: Internal Medicine

## 2020-06-16 ENCOUNTER — Encounter: Payer: Self-pay | Admitting: Obstetrics & Gynecology

## 2020-06-16 DIAGNOSIS — I10 Essential (primary) hypertension: Secondary | ICD-10-CM

## 2020-06-16 LAB — WOUND CULTURE
MICRO NUMBER:: 11815043
SPECIMEN QUALITY:: ADEQUATE

## 2020-06-16 LAB — HOUSE ACCOUNT TRACKING

## 2020-06-19 ENCOUNTER — Encounter: Payer: Self-pay | Admitting: Orthopedic Surgery

## 2020-06-19 ENCOUNTER — Telehealth: Payer: Self-pay | Admitting: *Deleted

## 2020-06-19 NOTE — Telephone Encounter (Signed)
Urinalysis from the time of surgery was negative for any type of UTI.  This particular lesion was positive for Klebsiella and the sensitivities were widely sensitive but not tested specifically for doxycycline so I would leave that up to your primary care provider as to whether or not you are on the right antibiotics but I am 95% sure that you are.  Not related to the surgery.

## 2020-06-19 NOTE — Telephone Encounter (Signed)
-----   Message from Christina Bruins, MD sent at 06/19/2020 11:07 AM EDT ----- Regarding: Refer to Gyn-Oncology Vulvar Bx:  Well differentiated Squamous cell Carcinoma.    Refer to Gyn-Onco asap.  Please offer a visit with me this week to discuss results.

## 2020-06-19 NOTE — Telephone Encounter (Signed)
Patient informed, scheduled on 06/20/20 @ 11:00am with Dr.Lavoie to discuss results.

## 2020-06-20 ENCOUNTER — Ambulatory Visit (INDEPENDENT_AMBULATORY_CARE_PROVIDER_SITE_OTHER): Payer: 59 | Admitting: Obstetrics & Gynecology

## 2020-06-20 ENCOUNTER — Other Ambulatory Visit: Payer: Self-pay

## 2020-06-20 ENCOUNTER — Encounter: Payer: Self-pay | Admitting: Obstetrics & Gynecology

## 2020-06-20 VITALS — BP 140/90

## 2020-06-20 DIAGNOSIS — C519 Malignant neoplasm of vulva, unspecified: Secondary | ICD-10-CM | POA: Diagnosis not present

## 2020-06-20 NOTE — Telephone Encounter (Addendum)
Patient scheduled on 06/22/20 @ 9:45am with Dr.Rossi.  Patient is aware.

## 2020-06-20 NOTE — Progress Notes (Signed)
    Christina [name] January 05, 1961 [id number]        60 y.o.  [id number] Accompanied by her husband  RP: Counseling on Squamous cell Carcinoma of the Vulva  HPI: Vulvar tumor biopsied on 06/15/2020.  No complication.  No bleeding, no discharge/pus.  No fever.     OB History  Gravida Para Term Preterm AB Living  4       2 4   SAB IAB Ectopic Multiple Live Births  2   0        # Outcome Date GA Lbr Len/2nd Weight Sex Delivery Anes PTL Lv  4 Gravida           3 Gravida           2 SAB           1 SAB             Past medical history,surgical history, problem list, medications, allergies, family history and social history were all reviewed and documented in the EPIC chart.   Directed ROS with pertinent positives and negatives documented in the history of present illness/assessment and plan.  Exam:  Vitals:   06/20/20 1124  BP: 140/90   General appearance:  Normal  Patho of Vulvar biopsy: Squamous cell carcinoma (Well differentiated)  FINAL MICROSCOPIC DIAGNOSIS:   A. VULVA, BIOPSY:  - Squamous cell carcinoma  - See comment   COMMENT:   Based on the biopsy, the carcinoma is keratinizing and appears well  differentiated. Depth of invasion cannot be determined on the biopsy  material. Dr. Jeannie Done reviewed the case and agrees with the above  diagnosis.    Assessment/Plan:  60 y.o. [id number]   1. 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  Vulvar Bx patho result explained to patient.  Dx and management reviewed.  Referred to Dr Lorenza Chick.  Appointment scheduled 06/22/2020 at 9:45 am.  Patient voiced understanding and agreement with the plan.  Princess Bruins MD, 11:38 AM 06/20/2020

## 2020-06-21 ENCOUNTER — Ambulatory Visit (INDEPENDENT_AMBULATORY_CARE_PROVIDER_SITE_OTHER): Payer: 59 | Admitting: Orthopedic Surgery

## 2020-06-21 ENCOUNTER — Ambulatory Visit: Payer: 59 | Admitting: Obstetrics & Gynecology

## 2020-06-21 ENCOUNTER — Ambulatory Visit (INDEPENDENT_AMBULATORY_CARE_PROVIDER_SITE_OTHER): Payer: Self-pay | Admitting: Rehabilitative and Restorative Service Providers"

## 2020-06-21 ENCOUNTER — Encounter: Payer: Self-pay | Admitting: Gynecologic Oncology

## 2020-06-21 ENCOUNTER — Encounter: Payer: Self-pay | Admitting: Rehabilitative and Restorative Service Providers"

## 2020-06-21 DIAGNOSIS — M25561 Pain in right knee: Secondary | ICD-10-CM

## 2020-06-21 DIAGNOSIS — Z96651 Presence of right artificial knee joint: Secondary | ICD-10-CM

## 2020-06-21 DIAGNOSIS — M25661 Stiffness of right knee, not elsewhere classified: Secondary | ICD-10-CM

## 2020-06-21 DIAGNOSIS — R262 Difficulty in walking, not elsewhere classified: Secondary | ICD-10-CM

## 2020-06-21 DIAGNOSIS — R600 Localized edema: Secondary | ICD-10-CM

## 2020-06-21 DIAGNOSIS — M6281 Muscle weakness (generalized): Secondary | ICD-10-CM

## 2020-06-21 DIAGNOSIS — G8929 Other chronic pain: Secondary | ICD-10-CM

## 2020-06-21 NOTE — Therapy (Addendum)
[facility] [address] [place], [place], [address] Phone: [phone]   Fax:  [phone]  Physical Therapy Treatment/Discharge  Patient Details  Name: Christina [name] MRN: [id number] Date of Birth: [birth year] Referring Provider (PT): Meredith Pel MD  PHYSICAL THERAPY DISCHARGE SUMMARY  Visits from Start of Care: 2  Current functional level related to goals / functional outcomes: See note   Remaining deficits: See note   Education / Equipment: HEP   Patient agrees to discharge. Patient goals were not met. Patient is being discharged due to a change in medical status.  Encounter Date: 06/21/2020   PT End of Session - 06/21/20 1734     Visit Number 2    Number of Visits 12    Date for PT Re-Evaluation 07/13/20    PT Start Time 0539    PT Stop Time 1600    PT Time Calculation (min) 45 min    Activity Tolerance Patient tolerated treatment well;No increased pain;Patient limited by fatigue    Behavior During Therapy [facility] for tasks assessed/performed             Past Medical History:  Diagnosis Date   Arthritis    Chicken pox    HLD (hyperlipidemia)    diet controlled   Hypertension    Recurrent upper respiratory infection (URI)    Seasonal allergies     Past Surgical History:  Procedure Laterality Date   BLADDER SUSPENSION     CHOLECYSTECTOMY     COLONOSCOPY     TOTAL KNEE ARTHROPLASTY Right 04/25/2020   Procedure: RIGHT TOTAL KNEE ARTHROPLASTY;  Surgeon: Meredith Pel, MD;  Location: [facility];  Service: Orthopedics;  Laterality: Right;   WISDOM TOOTH EXTRACTION      There were no vitals filed for this visit.   Subjective Assessment - 06/21/20 1523     Subjective Christina [name] has been very good with working with her post-surgical R knee.  Strength is not where she would like it to be.    Limitations Sitting;Standing;Walking    How long can you sit comfortably? Stiffness with sitting > 30 minutes    How long can you stand  comfortably? Edema with much greater than an hour    How long can you walk comfortably? Not walking for exercise yet, just household distances    Patient Stated Goals Return to yoga and more physically demanding activities    Currently in Pain? No/denies    Multiple Pain Sites No                [facility] PT Assessment - 06/21/20 0001       AROM   AROM Assessment Site Knee    Right/Left Knee Right    Right Knee Extension 0    Right Knee Flexion 128      Strength   Overall Strength Comments Thigh girth (L/R in degrees assessed 15 cm proximal to the superior patellar pole): 45.5 cm/46.0 cm                           [facility] Adult PT Treatment/Exercise - 06/21/20 0001       Therapeutic Activites    Therapeutic Activities Other Therapeutic Activities    Other Therapeutic Activities Step-down and step-up and over 4" and 6" slow eccentrics      Exercises   Exercises Knee/Hip      Knee/Hip Exercises: Aerobic   Recumbent Bike Seat 4 for 8  minutes (check seat next visit)      Knee/Hip Exercises: Seated   Long Arc Quad Strengthening;Both;3 sets;5 reps;Limitations    Long Arc Quad Limitations Seated straight leg raises                    PT Education - 06/21/20 1733     Education Details Reviewed HEP and correction in technique was needed.  Progressed eccentric quadriceps emphasis with HEP.    Person(s) Educated Patient    Methods Explanation;Demonstration;Tactile cues;Verbal cues    Comprehension Verbalized understanding;Tactile cues required;Need further instruction;Returned demonstration;Verbal cues required              PT Short Term Goals - 06/21/20 1734       PT SHORT TERM GOAL #1   Title Improve R knee flexion AROM to 125 degrees.    Baseline 130 06/21/2020    Time 4    Period Weeks    Status Achieved    Target Date 06/29/20               PT Long Term Goals - 06/21/20 1734       PT LONG TERM GOAL #1   Title Christina [name] will report  a FOTO score of 70 or better.    Baseline 46    Time 12    Period Weeks    Status On-going      PT LONG TERM GOAL #2   Title Christina [name] will report R knee pain at consistently 0-3/10 on the Numeric Pain Rating Scale.    Baseline Still using pain meds (5/10)    Time 12    Period Weeks    Status Achieved      PT LONG TERM GOAL #3   Title Improve R quadriceps strength as assessed by functional-self-report and FOTO.    Time 12    Period Weeks    Status On-going      PT LONG TERM GOAL #4   Title Christina [name] will be independent with her long-term HEP at DC.    Time 12    Period Weeks    Status On-going                   Plan - 06/21/20 1735     Clinical Impression Statement Christina [name] notes she has been going to the gym and completing her HEP.  She did require significant correction with her HEP for correct muscle (quadriceps) activation and technique.  Christina [name] mentioned she was recently diagnosed with cancer and she will be undergoing treatment for this so her PT attendance may be intermittent to non-existant.  We reviewed and progressed her program with recommendations to follow-up with PT when medically appropriate (IE focus on cancer treatment recommendations).  Given her excellent AROM and motivation, a great outcome is expected.    Examination-Activity Limitations Sit;Sleep;Bed Mobility;Bend;Lift;Squat;Stairs;Locomotion Level;Carry;Stand    Examination-Participation Restrictions Yard Work;Community Activity    Stability/Clinical Decision Making Stable/Uncomplicated    Rehab Potential Good    PT Frequency 1x / week    PT Duration 12 weeks    PT Treatment/Interventions ADLs/Self Care Home Management;Cryotherapy;Therapeutic activities;Stair training;Gait training;Therapeutic exercise;Balance training;Neuromuscular re-education;Patient/family education;Manual techniques;Vasopneumatic Device    PT Next Visit Plan FOTO    PT Home Exercise Plan Quadriceps sets and knee flexion activities     Consulted and Agree with Plan of Care Patient             Patient will benefit from skilled therapeutic intervention in order  to improve the following deficits and impairments:  Abnormal gait,Decreased endurance,Decreased range of motion,Decreased strength,Difficulty walking,Increased edema,Impaired flexibility,Pain  Visit Diagnosis: Difficulty walking  Localized edema  Muscle weakness (generalized)  Stiffness of right knee, not elsewhere classified  Chronic pain of right knee     Problem List Patient Active Problem List   Diagnosis Date Noted   Arthritis of right knee    S/P total knee arthroplasty, 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   Degenerative arthritis of knee, bilateral 12/31/2019   Patellofemoral arthritis of left knee 03/10/2019   Acute medial meniscus tear of left knee 11/19/2018   Sprain of medial collateral ligament of left knee 09/01/2018   Neck strain, initial encounter 08/28/2018   Degenerative disc disease, cervical 08/28/2018   Left lumbar radiculopathy 03/05/2018   Right lateral epicondylitis 10/30/2017   SI (sacroiliac) joint dysfunction 10/30/2017   Nonallopathic lesion of sacral region 10/30/2017   Nonallopathic lesion of lumbar region 10/30/2017   Nonallopathic lesion of thoracic region 10/30/2017   History of recurrent UTIs 04/11/2017   Hypertension, essential, benign 10/29/2016   Allergic rhinitis 10/29/2016    Farley Ly PT, MPT 06/21/2020, 5:38 PM  [facility] [address] [place], [place], [address] Phone: [phone]   Fax:  [phone]  Name: Christina [name] MRN: [id number] Date of Birth: Jun 30, 1960

## 2020-06-22 ENCOUNTER — Encounter: Payer: 59 | Admitting: Rehabilitative and Restorative Service Providers"

## 2020-06-22 ENCOUNTER — Encounter: Payer: Self-pay | Admitting: Gynecologic Oncology

## 2020-06-22 ENCOUNTER — Encounter: Payer: Self-pay | Admitting: Oncology

## 2020-06-22 ENCOUNTER — Inpatient Hospital Stay: Payer: 59 | Attending: Gynecologic Oncology | Admitting: Gynecologic Oncology

## 2020-06-22 ENCOUNTER — Encounter: Payer: Self-pay | Admitting: Orthopedic Surgery

## 2020-06-22 ENCOUNTER — Other Ambulatory Visit: Payer: Self-pay

## 2020-06-22 VITALS — BP 149/93 | HR 104 | Temp 97.2°F | Resp 16 | Ht 61.0 in | Wt 135.7 lb

## 2020-06-22 DIAGNOSIS — Z87891 Personal history of nicotine dependence: Secondary | ICD-10-CM | POA: Diagnosis not present

## 2020-06-22 DIAGNOSIS — C519 Malignant neoplasm of vulva, unspecified: Secondary | ICD-10-CM

## 2020-06-22 NOTE — Progress Notes (Signed)
Post-Op Visit Note   Patient: Christina [name]           Date of Birth: 1960/07/12           MRN: [id number] Visit Date: 06/21/2020 PCP: Burnis Medin, MD   Assessment & Plan:  Chief Complaint:  Chief Complaint  Patient presents with  . Right Knee - Follow-up   Visit Diagnoses:  1. Status post total right knee replacement     Plan: Christina [name] is a 60 year old patient who is now about 2 months out right total knee replacement.  She is doing well.  She has some other medical issues to attend to in terms of squamous cell carcinoma.  She is making great progress in physical therapy.  She appreciated Dr. Romona [name] efforts regarding her back.  On exam her incision is intact.  Trace effusion present.  Range of motion is excellent from 0 to about 120.  Collaterals are stable.  Patella tracks well.  Plan at this time is to continue nonweightbearing quad strengthening exercises.  Follow-up as needed.  She states she is getting a little bit fatigued with doctors especially with her recent diagnosis.  Follow-Up Instructions: Return if symptoms worsen or fail to improve.   Orders:  No orders of the defined types were placed in this encounter.  No orders of the defined types were placed in this encounter.   Imaging: No results found.  PMFS History: Patient Active Problem List   Diagnosis Date Noted  . Arthritis of right knee   . 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 Degenerative arthritis of knee, bilateral 12/31/2019  . Patellofemoral arthritis of left knee 03/10/2019  . Acute medial meniscus tear of left knee 11/19/2018  . Sprain of medial collateral ligament of left knee 09/01/2018  . Neck strain, initial encounter 08/28/2018  . Degenerative disc disease, cervical 08/28/2018  . Left lumbar radiculopathy 03/05/2018  . Right lateral epicondylitis 10/30/2017  . SI (sacroiliac) joint dysfunction 10/30/2017  . Nonallopathic lesion of sacral region 10/30/2017  . Nonallopathic lesion  of lumbar region 10/30/2017  . Nonallopathic lesion of thoracic region 10/30/2017  . History of recurrent UTIs 04/11/2017  . 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 Allergic rhinitis 10/29/2016   Past Medical History:  Diagnosis Date  . Arthritis   . Chicken pox   . HLD (hyperlipidemia)    diet controlled  . Hypertension   . Recurrent upper respiratory infection (URI)   . Seasonal allergies     Family History  Problem Relation Age of Onset  . Hyperlipidemia Mother   . Hypertension Mother   . Stroke Mother   . Breast cancer Mother   . Prostate cancer Father   . Hypertension Maternal Grandmother   . Hypertension Paternal Grandmother   . Stroke Paternal Grandmother   . Allergic rhinitis Neg Hx   . Asthma Neg Hx   . Ovarian cancer Neg Hx   . Uterine cancer Neg Hx   . Colon cancer Neg Hx   . Pancreatic cancer Neg Hx     Past Surgical History:  Procedure Laterality Date  . BLADDER SUSPENSION    . CHOLECYSTECTOMY    . COLONOSCOPY    . TOTAL KNEE ARTHROPLASTY Right 04/25/2020   Procedure: RIGHT TOTAL KNEE ARTHROPLASTY;  Surgeon: Meredith Pel, MD;  Location: [facility];  Service: Orthopedics;  Laterality: Right;  . WISDOM TOOTH EXTRACTION     Social History   Occupational History  . Occupation: retired  Tobacco Use  . Smoking status: Former Smoker    Packs/day: 0.50    Years: 2.00    Pack years: 1.00  . Smokeless tobacco: Never Used  Vaping Use  . Vaping Use: Never used  Substance and Sexual Activity  . Alcohol use: Yes    Comment: occasionally  . Drug use: No  . Sexual activity: Yes    Partners: Male    Birth control/protection: Post-menopausal

## 2020-06-22 NOTE — Progress Notes (Signed)
Consult Note: Gyn-Onc  Consult was requested by Dr. Dellis Filbert for the evaluation of Christina [name] 60 y.o. female  CC:  Chief Complaint  Patient presents with  . Squamous cell carcinoma of vulva [facility])    Assessment/Plan:  Christina [name]  is a 60 y.o.  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  The tumor is approximately 6x4 cm, crosses the midline and extends to the left.  I do not feel that this tumor can be primarily excised with adequate margins and preservation of the anal sphincter. Therefore I favor primary radiation to the vulva.  I will perform a PET scan to evaluate for distant metastatic disease.  If negative, I would recommend surgical excision of the bilateral inguinal lymph nodes. If PET is positive for obvious nodal mets (or distant disease) would not recommend surgery until nodal metastases are easily resectable for debulking.   Will facilitate referral to radiation oncology for consultation.   HPI: Christina [name] is a 60 year old P4 who was seen in consultation at the request of Dr Dellis Filbert for evaluation of a vulvar cancer.  The patient began experiencing vulvar burning after her knee replacement in March 2022.  She mentioned this to her primary care physician who looked at it and was unclear about the diagnosis but recommended consultation with a gynecologist.  The patient did not have an established gynecologist and therefore made a new patient consultation with Dr. Dellis Filbert.  Dr. Dellis Filbert saw the patient on 06/16/2020 which revealed a squamous cell carcinoma of the vulva.  The tumor was well differentiated and keratinizing.  Her medical history is most significant for a remote history of tobacco use.  Her gynecologic history is remarkable for 4 prior SVD's and no history of abnormal paps or high risk HPV. Her last pap was in 2019 and was normal.  Her family cancer history is unremarkable.  Current Meds:  Outpatient Encounter  Medications as of 06/22/2020  Medication Sig  . cetirizine (ZYRTEC) 10 MG tablet TAKE 1 TO 2 TABLETS BY MOUTH DAILY AS NEEDED (Patient taking differently: Take 10 mg by mouth daily.)  . Cholecalciferol (VITAMIN D) 50 MCG (2000 UT) tablet Take 2,000 Units by mouth daily.  Marland Kitchen ipratropium (ATROVENT) 0.06 % nasal spray USE 2 SPRAYS IN EACH NOSTRIL THREE TIMES DAILY (Patient taking differently: Place 2 sprays into both nostrils 3 (three) times daily as needed for rhinitis.)  . levocetirizine (XYZAL) 5 MG tablet Take 1 tablet (5 mg total) by mouth every evening. (Patient taking differently: Take 5 mg by mouth at bedtime as needed for allergies.)  . lidocaine (XYLOCAINE) 2 % solution Apply small amount over sore lesion up to every 4 hours as needed for pain  . losartan (COZAAR) 50 MG tablet TAKE 1 TABLET(50 MG) BY MOUTH DAILY  . oxybutynin (DITROPAN XL) 15 MG 24 hr tablet Take 15 mg by mouth daily as needed (urinary frequency).  Marland Kitchen oxyCODONE-acetaminophen (PERCOCET/ROXICET) 5-325 MG tablet 1 po at bedtime as needed  . Probiotic Product (PROBIOTIC PO) Take 1 tablet by mouth daily.  . [DISCONTINUED] celecoxib (CELEBREX) 100 MG capsule Take 1 capsule (100 mg total) by mouth 2 (two) times daily.  . [DISCONTINUED] doxycycline (VIBRA-TABS) 100 MG tablet Take 1 tablet (100 mg total) by mouth 2 (two) times daily.  . [DISCONTINUED] gabapentin (NEURONTIN) 300 MG capsule TAKE 1 CAPSULE(300 MG) BY MOUTH THREE TIMES DAILY  . [DISCONTINUED] traMADol (ULTRAM) 50 MG tablet Take  1 tablet (50 mg total) by mouth at bedtime as needed.   No facility-administered encounter medications on file as of 06/22/2020.    Allergy: No Known Allergies  Social Hx:   Social History   Socioeconomic History  . Marital status: Married    Spouse name: Not on file  . Number of children: Not on file  . Years of education: Not on file  . Highest education level: Not on file  Occupational History  . Occupation: retired  Tobacco Use  .  Smoking status: Former Smoker    Packs/day: 0.50    Years: 2.00    Pack years: 1.00  . Smokeless tobacco: Never Used  Vaping Use  . Vaping Use: Never used  Substance and Sexual Activity  . Alcohol use: Yes    Comment: occasionally  . Drug use: No  . Sexual activity: Yes    Partners: Male    Birth control/protection: Post-menopausal  Other Topics Concern  . Not on file  Social History Narrative  . Not on file   Social Determinants of Health   Financial Resource Strain: Not on file  Food Insecurity: Not on file  Transportation Needs: Not on file  Physical Activity: Not on file  Stress: Not on file  Social Connections: Not on file  Intimate Partner Violence: Not on file    Past Surgical Hx:  Past Surgical History:  Procedure Laterality Date  . BLADDER SUSPENSION    . CHOLECYSTECTOMY    . COLONOSCOPY    . TOTAL KNEE ARTHROPLASTY Right 04/25/2020   Procedure: RIGHT TOTAL KNEE ARTHROPLASTY;  Surgeon: Meredith Pel, MD;  Location: [facility];  Service: Orthopedics;  Laterality: Right;  . WISDOM TOOTH EXTRACTION      Past Medical Hx:  Past Medical History:  Diagnosis Date  . Arthritis   . Chicken pox   . HLD (hyperlipidemia)    diet controlled  . Hypertension   . Recurrent upper respiratory infection (URI)   . Seasonal allergies     Past Gynecological History:  SVD x 4. No hx of abnormal paps. No LMP recorded. Patient is postmenopausal.  Family Hx:  Family History  Problem Relation Age of Onset  . Hyperlipidemia Mother   . Hypertension Mother   . Stroke Mother   . Breast cancer Mother   . Prostate cancer Father   . Hypertension Maternal Grandmother   . Hypertension Paternal Grandmother   . Stroke Paternal Grandmother   . Allergic rhinitis Neg Hx   . Asthma Neg Hx   . Ovarian cancer Neg Hx   . Uterine cancer Neg Hx   . Colon cancer Neg Hx   . Pancreatic cancer Neg Hx     Review of Systems:  Constitutional  Reports weight loss  ENT Normal appearing  ears and nares bilaterally Skin/Breast  No rash, sores, jaundice, itching, dryness Cardiovascular  No chest pain, shortness of breath, or edema  Pulmonary  No cough or wheeze.  Gastro Intestinal  No nausea, vomitting, or diarrhoea. No bright red blood per rectum, no abdominal pain, change in bowel movement, or constipation.  Genito Urinary  No frequency, urgency, dysuria, + vulvar pruritis Musculo Skeletal  No myalgia, arthralgia, joint swelling or pain  Neurologic  No weakness, numbness, change in gait,  Psychology  No depression, anxiety, insomnia.   Vitals:  Blood pressure (!) 149/93, pulse (!) 104, temperature (!) 97.2 F (36.2 C), temperature source Tympanic, resp. rate 16, height 5\' 1"  (1.549 m), weight 135  lb 11.2 oz (61.6 kg), SpO2 100 %.  Physical Exam: WD in NAD Neck  Supple NROM, without any enlargements.  Lymph Node Survey No cervical supraclavicular or inguinal adenopathy Cardiovascular  Well perfused peripheries.  Lungs  No increased WOB Skin  No rash/lesions/breakdown  Psychiatry  Alert and oriented to person, place, and time  Abdomen  Normoactive bowel sounds, abdomen soft, non-tender and nonobese without evidence of hernia. Back No CVA tenderness Genito Urinary  Vulva/vagina: 6x4cm oval exophytic lesion on perineal body (crossing the midline) and extending to the left (more than right). Mass is fixed to the external anal sphincter but does not involve the anal or rectal mucosa.   Bladder/urethra:  No lesions or masses, well supported bladder  Vagina: normal  Cervix: Normal appearing, no lesions.  Uterus:  Small, mobile, no parametrial involvement or nodularity.  Adnexa: no palpable masses. Rectal  Good tone, no masses no cul de sac nodularity.  Extremities  No bilateral cyanosis, clubbing or edema.  60 minutes of total time was spent for this patient encounter, including preparation, face-to-face counseling with the patient and coordination of care,  review of imaging (results and images), communication with the referring provider and documentation of the encounter.   Thereasa Solo, MD  06/22/2020, 11:42 AM

## 2020-06-22 NOTE — Progress Notes (Signed)
Met with Christina [name] and her husband at her appointment with Dr. Denman George.  Reviewed plan for PET scan, radiation and possible procedure at [facility].  Provided her with the [place] folder and encouraged her to call with any questions or concerns.

## 2020-06-22 NOTE — Patient Instructions (Signed)
There is a cancer of the vulva. It may be a stage IB. However, we will need additional information before we can determine this. The lesion is attached to the anal sphincter and therefore the primary treatment for this is radiation, rather than surgery. We will first order a PET scan to evaluate for distant metastases or obvious lymph node involvement.  If the lymph nodes are not obviously involved by PET scan, you may need an additional surgical procedure to remove lymph nodes for evaluation under the microscope.  Dr Denman George will schedule you to see the radiation doctor who will begin planning the treatment for your vulvar radiation.  6 weeks after you have completed treatment you will have biopsies of the vulva to determine if there has been a complete response to treatment.

## 2020-06-24 ENCOUNTER — Encounter: Payer: Self-pay | Admitting: Obstetrics & Gynecology

## 2020-06-25 NOTE — Progress Notes (Signed)
Had bx vulvar ssca  per  biopsy undergoing  therapy.

## 2020-06-26 ENCOUNTER — Encounter: Payer: Self-pay | Admitting: Gynecologic Oncology

## 2020-06-26 NOTE — Progress Notes (Addendum)
GYN Location of Tumor / Histology: vulva  Christina [name] presented with symptoms of: Painful burning lesion with mild spotting at the vulvovaginal area posteriorly  Biopsies revealed: 06/15/2020   Past/Anticipated interventions by Gyn/Onc surgery, if any: 06/22/2020 - Dr Denman George   Past/Anticipated interventions by medical oncology, if any: none at this time  Weight changes, if any: yes, 10 pound weight loss over 6 weeks  Bowel/Bladder complaints, if any: Yes.  , occasional diarrhea, increased bowel movements. Denies urinary symptoms.  Nausea/Vomiting, if any: yes, nausea  Pain issues, if any:  yes, pain at lesion  SAFETY ISSUES:  Prior radiation? no  Pacemaker/ICD? no  Possible current pregnancy? no, postmenopausal  Is the patient on methotrexate? no  Current Complaints / other details:  Would like to discuss medications for pain, sleeping and nausea medication. Poor sleep and poor appetite.   Vitals:   07/03/20 0755  BP: (!) 145/91  Pulse: 86  Resp: 18  Temp: (!) 97 F (36.1 C)  TempSrc: Temporal  SpO2: 99%  Weight: 134 lb 6 oz (61 kg)  Height: 5\' 1"  (1.549 m)

## 2020-06-28 ENCOUNTER — Telehealth: Payer: Self-pay | Admitting: Oncology

## 2020-06-28 ENCOUNTER — Other Ambulatory Visit: Payer: Self-pay | Admitting: Gynecologic Oncology

## 2020-06-28 DIAGNOSIS — C519 Malignant neoplasm of vulva, unspecified: Secondary | ICD-10-CM

## 2020-06-28 NOTE — Telephone Encounter (Signed)
Christina [name] and let her know that her PET scan is not authorized with her insurance so Dr. Denman George has ordered a CT scan.  Advised her the CT has been scheduled for 06/30/20 at 7:30 am (NPO 4 hours before, drink contrast at 5:30 and 6:30). Also scheduled lab appointment for 06/29/20 at 10:00 at the [facility].  She verbalized understanding and agreement of the new appointments and instructions.

## 2020-06-28 NOTE — Progress Notes (Signed)
Insurance is denying coverage for PET scan.   Based on [employer] Guidelines Section(s): ONC 24.6 Cancers of External Genitalia - Initial Workup/Staging, we cannot approve this request. Your records show that you have cancer in your external genitalia. The request cannot be approved because: Your disease has not been staged at two or higher. The following must be met. -You have signs or symptoms that your disease has spread to your chest. -Results of a chest x-ray were not normal.  Dr. Denman George made aware. Plan for CT AP and CT chest (if insurance will approve).

## 2020-06-29 ENCOUNTER — Other Ambulatory Visit: Payer: Self-pay

## 2020-06-29 ENCOUNTER — Inpatient Hospital Stay: Payer: 59

## 2020-06-29 DIAGNOSIS — C519 Malignant neoplasm of vulva, unspecified: Secondary | ICD-10-CM

## 2020-06-29 LAB — BASIC METABOLIC PANEL
Anion gap: 11 (ref 5–15)
BUN: 12 mg/dL (ref 8–23)
CO2: 26 mmol/L (ref 22–32)
Calcium: 9.4 mg/dL (ref 8.9–10.3)
Chloride: 103 mmol/L (ref 98–111)
Creatinine, Ser: 0.77 mg/dL (ref 0.44–1.00)
GFR, Estimated: >60 mL/min (ref >60)
Glucose, Bld: 84 mg/dL (ref 70–99)
Potassium: 4 mmol/L (ref 3.5–5.1)
Sodium: 140 mmol/L (ref 135–145)

## 2020-06-30 ENCOUNTER — Ambulatory Visit (HOSPITAL_COMMUNITY)
Admission: RE | Admit: 2020-06-30 | Discharge: 2020-06-30 | Disposition: A | Discharge status: Home / Self Care | Payer: 59 | Source: Ambulatory Visit | Attending: Gynecologic Oncology | Admitting: Gynecologic Oncology

## 2020-06-30 DIAGNOSIS — C519 Malignant neoplasm of vulva, unspecified: Secondary | ICD-10-CM | POA: Insufficient documentation

## 2020-06-30 MED ORDER — SODIUM CHLORIDE (PF) 0.9 % IJ SOLN
INTRAMUSCULAR | Status: AC
  Filled 2020-06-30: qty 50

## 2020-06-30 MED ORDER — IOHEXOL 300 MG/ML  SOLN
75.0000 mL | Freq: Once | INTRAMUSCULAR | Status: AC | PRN
  Administered 2020-06-30: 75 mL via INTRAVENOUS

## 2020-06-30 NOTE — Progress Notes (Signed)
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   Clinical Stage: IB, Vulvar Cancer, Squamous cell  with involvement of the anal sphincter.    HISTORY OF PRESENT ILLNESS::Christina [name] is a 60 y.o. female who is accompanied by her husband. she is seen as a courtesy of Dr. Denman George for an opinion concerning radiation therapy as part of management for her recently diagnosed vulvar cancer. The patient presented to her PCP with vulvar itching and discomfort on 06/12/20.  Patient was refered by Dr. Dellis Filbert on 06/15/20. Physical exam performed that day show a large solid elevated lesion about 3x3cm with central ulceration.   The patient underwent a biopsy on 04/28 showing a squamous cell carcinoma which appeared to be well differentiated and keratinizing.  She was referred to Dr. Denman George on 5/05 who recommended primary radiation to the vulva instead of excision due to involvement of the anal sphincter.  She recommended a PET scan to evaluate for distant metastatic disease.  Patient's insurance company denied a PET scan but did approve a CT scan of the chest abdomen and pelvis.  This was performed on May 13. The vulvar lesion could not be identified on CT scan.  There is no suspicious lymphadenopathy in the pelvis or inguinal area or distant metastatic spread.  PREVIOUS RADIATION THERAPY: No  PAST MEDICAL HISTORY:  Past Medical History:  Diagnosis Date  . Arthritis   . Chicken pox   . HLD (hyperlipidemia)    diet controlled  . Hypertension   . Recurrent upper respiratory infection (URI)   . Seasonal allergies     PAST SURGICAL HISTORY: Past Surgical History:  Procedure Laterality Date  . BLADDER SUSPENSION     . CHOLECYSTECTOMY    . COLONOSCOPY    . TOTAL KNEE ARTHROPLASTY Right 04/25/2020   Procedure: RIGHT TOTAL KNEE ARTHROPLASTY;  Surgeon: Meredith Pel, MD;  Location: [facility];  Service: Orthopedics;  Laterality: Right;  . WISDOM TOOTH EXTRACTION      FAMILY HISTORY:  Family History  Problem Relation Age of Onset  . Hyperlipidemia Mother   . Hypertension Mother   . Stroke Mother   . Breast cancer Mother   . Prostate cancer Father   . Hypertension Maternal Grandmother   . Hypertension Paternal Grandmother   . Stroke Paternal Grandmother   . Allergic rhinitis Neg Hx   . Asthma Neg Hx   . Ovarian cancer Neg Hx   . Uterine cancer Neg Hx   . Colon cancer Neg Hx   . Pancreatic cancer Neg Hx     SOCIAL HISTORY:  Social History   Tobacco Use  . Smoking status: Former Smoker    Packs/day: 0.50    Years: 2.00    Pack years: 1.00  . Smokeless tobacco: Never Used  Vaping Use  . Vaping Use: Never used  Substance Use Topics  . Alcohol use: Yes    Comment: occasionally  . Drug use: No    ALLERGIES: No Known Allergies  MEDICATIONS:  Current Outpatient Medications  Medication Sig Dispense Refill  . acetaminophen (TYLENOL) 325 MG tablet Take 650 mg by mouth every 6 (six) hours as needed.    . cetirizine (ZYRTEC) 10 MG  tablet TAKE 1 TO 2 TABLETS BY MOUTH DAILY AS NEEDED (Patient taking differently: Take 10 mg by mouth daily.) 60 tablet 5  . Cholecalciferol (VITAMIN D) 50 MCG (2000 UT) tablet Take 2,000 Units by mouth daily.    . diphenhydramine-acetaminophen (TYLENOL PM) 25-500 MG TABS tablet Take 1 tablet by mouth at bedtime as needed.    Marland Kitchen ipratropium (ATROVENT) 0.06 % nasal spray USE 2 SPRAYS IN EACH NOSTRIL THREE TIMES DAILY (Patient taking differently: Place 2 sprays into both nostrils 3 (three) times daily as needed for rhinitis.) 15 mL 4  . levocetirizine (XYZAL) 5 MG tablet Take 1 tablet (5 mg total) by mouth every evening. (Patient taking differently: Take 5 mg by mouth at  bedtime as needed for allergies.) 30 tablet 5  . LORazepam (ATIVAN) 0.5 MG tablet Take 1 tablet (0.5 mg total) by mouth every 8 (eight) hours. As needed for nausea or anxiety 30 tablet 0  . losartan (COZAAR) 50 MG tablet TAKE 1 TABLET(50 MG) BY MOUTH DAILY 90 tablet 0  . oxybutynin (DITROPAN XL) 15 MG 24 hr tablet Take 15 mg by mouth daily as needed (urinary frequency).    Marland Kitchen oxyCODONE-acetaminophen (PERCOCET/ROXICET) 5-325 MG tablet Take 1 tablet by mouth every 4 (four) hours as needed for severe pain. 30 tablet 0  . Probiotic Product (PROBIOTIC PO) Take 1 tablet by mouth daily.    Marland Kitchen lidocaine (XYLOCAINE) 2 % solution Apply small amount over sore lesion up to every 4 hours as needed for pain (Patient not taking: Reported on 07/03/2020) 15 mL 0  . oxyCODONE-acetaminophen (PERCOCET/ROXICET) 5-325 MG tablet 1 po at bedtime as needed (Patient not taking: Reported on 07/03/2020) 21 tablet 0   No current facility-administered medications for this encounter.    REVIEW OF SYSTEMS:  A 10+ POINT REVIEW OF SYSTEMS WAS OBTAINED including neurology, dermatology, psychiatry, cardiac, respiratory, lymph, extremities, GI, GU, musculoskeletal, constitutional, reproductive, HEENT.  Patient complains of itching in the perineum and being very uncomfortable given the location of the lesion.  She denies any significant bleeding from the area.   PHYSICAL EXAM:  height is 5\' 1"  (1.549 m) and weight is 134 lb 6 oz (61 kg). Her temporal temperature is 97 F (36.1 C) (abnormal). Her blood pressure is 145/91 (abnormal) and her pulse is 86. Her respiration is 18 and oxygen saturation is 99%.   General: Alert and oriented, in no acute distress HEENT: Head is normocephalic. Extraocular movements are intact.  Neck: Neck is supple, no palpable cervical or supraclavicular lymphadenopathy. Heart: Regular in rate and rhythm with no murmurs, rubs, or gallops. Chest: Clear to auscultation bilaterally, with no rhonchi, wheezes, or  rales. Abdomen: Soft, nontender, nondistended, with no rigidity or guarding. Extremities: No cyanosis or edema. Lymphatics: No inguinal adenopathy Skin: No concerning lesions. Musculoskeletal: symmetric strength and muscle tone throughout. Neurologic: Cranial nerves II through XII are grossly intact. No obvious focalities. Speech is fluent. Coordination is intact. Psychiatric: Judgment and insight are intact. Affect is appropriate. Pelvic examination: the patient is noted to have an exophytic lesion with epicenter located along the perineal body.  This extends into the posterior labia bilaterally, more so along the right side.  Lesion extends posteriorly to involve the external anal sphincter.  On rectal examination sphincter tone is good.  On speculum/vaginal examination there are no mucosal lesions however along the right lateral wall approximately halfway up, a firm area is palpable.  ECOG = 1  0 - Asymptomatic (Fully active, able to carry  on all predisease activities without restriction)  1 - Symptomatic but completely ambulatory (Restricted in physically strenuous activity but ambulatory and able to carry out work of a light or sedentary nature. For example, light housework, office work)  2 - Symptomatic, <50% in bed during the day (Ambulatory and capable of all self care but unable to carry out any work activities. Up and about more than 50% of waking hours)  3 - Symptomatic, >50% in bed, but not bedbound (Capable of only limited self-care, confined to bed or chair 50% or more of waking hours)  4 - Bedbound (Completely disabled. Cannot carry on any self-care. Totally confined to bed or chair)  5 - Death   Santiago Glad MM, Creech RH, Tormey DC, et al. [phone]). "Toxicity and response criteria of the [facility] Group". Am. Evlyn Clines. Oncol. 5 (6): 649-55  LABORATORY DATA:  Lab Results  Component Value Date   WBC 7.5 04/20/2020   HGB 14.4 04/20/2020   HCT 42.5 04/20/2020    MCV 93.0 04/20/2020   PLT 409 (H) 04/20/2020   NEUTROABS 4.2 12/09/2018   Lab Results  Component Value Date   NA 140 06/29/2020   K 4.0 06/29/2020   CL 103 06/29/2020   CO2 26 06/29/2020   GLUCOSE 84 06/29/2020   CREATININE 0.77 06/29/2020   CALCIUM 9.4 06/29/2020      RADIOGRAPHY: CT Chest W Contrast  Result Date: 06/30/2020 CLINICAL DATA:  New diagnosis of clinical stage IB cervical carcinoma with anal sphincter involvement. Evaluate for distant metastatic disease. Previous bladder surgery. EXAM: CT CHEST, ABDOMEN, AND PELVIS WITH CONTRAST TECHNIQUE: Multidetector CT imaging of the chest, abdomen and pelvis was performed following the standard protocol during bolus administration of intravenous contrast. CONTRAST:  61mL OMNIPAQUE IOHEXOL 300 MG/ML  SOLN COMPARISON:  None. FINDINGS: CT CHEST FINDINGS Cardiovascular: Mild aortic and coronary artery atherosclerosis. No acute vascular findings. The heart size is normal. There is no pericardial effusion. Mediastinum/Nodes: There are no enlarged mediastinal, hilar or axillary lymph nodes. The thyroid gland, trachea and esophagus demonstrate no significant findings. Lungs/Pleura: There is no pleural effusion. 4 mm right middle lobe nodule on image 86/3, possibly a lymph node. No suspicious pulmonary nodularity. Musculoskeletal/Chest wall: No chest wall mass or suspicious osseous findings. Moderate degenerative changes in the midthoracic spine. CT ABDOMEN AND PELVIS FINDINGS Hepatobiliary: The liver is normal in density without suspicious focal abnormality. Mild extrahepatic biliary dilatation status post cholecystectomy, likely physiologic. Pancreas: Unremarkable. No pancreatic ductal dilatation or surrounding inflammatory changes. Spleen: Normal in size without focal abnormality. Adrenals/Urinary Tract: Both adrenal glands appear normal. The kidneys appear normal without evidence of urinary tract calculus, suspicious lesion or hydronephrosis. No  bladder abnormalities are seen. Stomach/Bowel: Enteric contrast was administered and has passed into the rectum. The stomach is decompressed and appears unremarkable. No evidence of bowel wall thickening, distention or surrounding inflammatory change. The appendix appears normal. Vascular/Lymphatic: There are no enlarged abdominal or pelvic lymph nodes. Mild aortic and branch vessel atherosclerosis. No acute vascular findings. The portal, superior mesenteric and splenic veins are patent. Reproductive: No adnexal mass. Mild soft tissue fullness of the cervix which extends asymmetrically to the right. The uterus appears normal without distension of the endometrial canal. No obvious anorectal extension. Other: No ascites or peritoneal nodularity.  Intact abdominal wall. Musculoskeletal: No acute or significant osseous findings. Moderate facet arthropathy in the lower lumbar spine with possible postsurgical changes on the right at L5-S1. IMPRESSION: 1. No evidence of distant metastatic  disease in the chest, abdomen or pelvis. 2. The patient's known cervical cancer is not well visualized. No gross bladder or anorectal involvement. No pelvic adenopathy. 3. Tiny right middle lobe nodule, likely benign. Attention on follow-up. 4.  Aortic Atherosclerosis (ICD10-I70.0). Electronically Signed   By: Richardean Sale M.D.   On: 06/30/2020 16:57   CT Abdomen Pelvis W Contrast  Result Date: 06/30/2020 CLINICAL DATA:  New diagnosis of clinical stage IB cervical carcinoma with anal sphincter involvement. Evaluate for distant metastatic disease. Previous bladder surgery. EXAM: CT CHEST, ABDOMEN, AND PELVIS WITH CONTRAST TECHNIQUE: Multidetector CT imaging of the chest, abdomen and pelvis was performed following the standard protocol during bolus administration of intravenous contrast. CONTRAST:  49mL OMNIPAQUE IOHEXOL 300 MG/ML  SOLN COMPARISON:  None. FINDINGS: CT CHEST FINDINGS Cardiovascular: Mild aortic and coronary artery  atherosclerosis. No acute vascular findings. The heart size is normal. There is no pericardial effusion. Mediastinum/Nodes: There are no enlarged mediastinal, hilar or axillary lymph nodes. The thyroid gland, trachea and esophagus demonstrate no significant findings. Lungs/Pleura: There is no pleural effusion. 4 mm right middle lobe nodule on image 86/3, possibly a lymph node. No suspicious pulmonary nodularity. Musculoskeletal/Chest wall: No chest wall mass or suspicious osseous findings. Moderate degenerative changes in the midthoracic spine. CT ABDOMEN AND PELVIS FINDINGS Hepatobiliary: The liver is normal in density without suspicious focal abnormality. Mild extrahepatic biliary dilatation status post cholecystectomy, likely physiologic. Pancreas: Unremarkable. No pancreatic ductal dilatation or surrounding inflammatory changes. Spleen: Normal in size without focal abnormality. Adrenals/Urinary Tract: Both adrenal glands appear normal. The kidneys appear normal without evidence of urinary tract calculus, suspicious lesion or hydronephrosis. No bladder abnormalities are seen. Stomach/Bowel: Enteric contrast was administered and has passed into the rectum. The stomach is decompressed and appears unremarkable. No evidence of bowel wall thickening, distention or surrounding inflammatory change. The appendix appears normal. Vascular/Lymphatic: There are no enlarged abdominal or pelvic lymph nodes. Mild aortic and branch vessel atherosclerosis. No acute vascular findings. The portal, superior mesenteric and splenic veins are patent. Reproductive: No adnexal mass. Mild soft tissue fullness of the cervix which extends asymmetrically to the right. The uterus appears normal without distension of the endometrial canal. No obvious anorectal extension. Other: No ascites or peritoneal nodularity.  Intact abdominal wall. Musculoskeletal: No acute or significant osseous findings. Moderate facet arthropathy in the lower lumbar  spine with possible postsurgical changes on the right at L5-S1. IMPRESSION: 1. No evidence of distant metastatic disease in the chest, abdomen or pelvis. 2. The patient's known cervical cancer is not well visualized. No gross bladder or anorectal involvement. No pelvic adenopathy. 3. Tiny right middle lobe nodule, likely benign. Attention on follow-up. 4.  Aortic Atherosclerosis (ICD10-I70.0). Electronically Signed   By: Richardean Sale M.D.   On: 06/30/2020 16:57      IMPRESSION: Clinical Stage: IB, Vulvar Cancer (Squamous cell carcinoma) with involvement of the anal sphincter   Given the extension posteriorly to involve the anal sphincter, Dr. Denman George feels that surgical intervention is not indicated in light of the potential for fecal incontinence with complete resection.   Her CT scans shows no obvious spread and she would be a candidate for radiation therapy with curative intent.  Prior to initiation of radiation therapy I will speak with Dr. Denman George concerning inguinal node evaluation.  Today, I talked to the patient and her husband about the findings and work-up thus far.  We discussed the natural history of vulvar cancer and general treatment, highlighting the role of  radiotherapy in the management.  We discussed the available radiation techniques, and focused on the details of logistics and delivery.  We reviewed the anticipated acute and late sequelae associated with radiation in this setting.  The patient was encouraged to ask questions that I answered to the best of my ability.  A patient consent form was discussed and signed.  We retained a copy for our records.  The patient would like to proceed with radiation and will be scheduled for CT simulation.  PLAN: Final recommendations pending discussion with Dr. Denman George.  Would recommend approximately 6 and half weeks of radiation therapy directed at the vulvar region.   60 minutes of total time was spent for this patient encounter, including  preparation, face-to-face counseling with the patient and coordination of care, physical exam, and documentation of the encounter.   ------------------------------------------------  [name], PhD, MD  This document serves as a record of services personally performed by Gery Pray, MD. It was created on his behalf by Roney Mans, a trained medical scribe. The creation of this record is based on the scribe's personal observations and the provider's statements to them. This document has been checked and approved by the attending provider.

## 2020-07-03 ENCOUNTER — Ambulatory Visit
Admission: RE | Admit: 2020-07-03 | Discharge: 2020-07-03 | Disposition: A | Discharge status: Home / Self Care | Payer: 59 | Source: Ambulatory Visit | Attending: Radiation Oncology | Admitting: Radiation Oncology

## 2020-07-03 ENCOUNTER — Encounter: Payer: Self-pay | Admitting: Radiation Oncology

## 2020-07-03 ENCOUNTER — Telehealth: Payer: Self-pay

## 2020-07-03 VITALS — BP 145/91 | HR 86 | Temp 97.0°F | Resp 18 | Ht 61.0 in | Wt 134.4 lb

## 2020-07-03 DIAGNOSIS — I10 Essential (primary) hypertension: Secondary | ICD-10-CM | POA: Diagnosis not present

## 2020-07-03 DIAGNOSIS — C519 Malignant neoplasm of vulva, unspecified: Secondary | ICD-10-CM | POA: Diagnosis not present

## 2020-07-03 DIAGNOSIS — Z87891 Personal history of nicotine dependence: Secondary | ICD-10-CM | POA: Diagnosis not present

## 2020-07-03 DIAGNOSIS — M129 Arthropathy, unspecified: Secondary | ICD-10-CM | POA: Diagnosis not present

## 2020-07-03 DIAGNOSIS — I2510 Atherosclerotic heart disease of native coronary artery without angina pectoris: Secondary | ICD-10-CM | POA: Insufficient documentation

## 2020-07-03 DIAGNOSIS — Z8042 Family history of malignant neoplasm of prostate: Secondary | ICD-10-CM | POA: Diagnosis not present

## 2020-07-03 DIAGNOSIS — Z79899 Other long term (current) drug therapy: Secondary | ICD-10-CM | POA: Insufficient documentation

## 2020-07-03 DIAGNOSIS — E785 Hyperlipidemia, unspecified: Secondary | ICD-10-CM | POA: Insufficient documentation

## 2020-07-03 DIAGNOSIS — Z803 Family history of malignant neoplasm of breast: Secondary | ICD-10-CM | POA: Diagnosis not present

## 2020-07-03 MED ORDER — LORAZEPAM 0.5 MG PO TABS
0.5000 mg | ORAL_TABLET | Freq: Three times a day (TID) | ORAL | 0 refills | Status: DC
Start: 2020-07-03 — End: 2020-11-10

## 2020-07-03 MED ORDER — OXYCODONE-ACETAMINOPHEN 5-325 MG PO TABS: 1.0000 | ORAL_TABLET | ORAL | 0 refills | Status: DC | PRN

## 2020-07-03 NOTE — Progress Notes (Signed)
See MD note for nursing evaluation. °

## 2020-07-03 NOTE — Telephone Encounter (Signed)
Told Christina [name] that the CT does not show distant metastatic disease.  There is not pelvic adenopathy.  She does have aortic atherosclerosis. A tiny pulmonary nodule seen in the right middle lobe, likely benign.  Will f/u as recommended per [facility]. Pt saw Dr. Sondra Come today and was going to discuss with Dr. Denman George if the bilateral inguinal lymph node excision is needed and would let patient know decision today. Gave information to [name], NP to review with Drs. Kinard and [employer].

## 2020-07-04 ENCOUNTER — Ambulatory Visit (HOSPITAL_COMMUNITY): Payer: 59

## 2020-07-04 ENCOUNTER — Encounter: Payer: Self-pay | Admitting: *Deleted

## 2020-07-04 ENCOUNTER — Telehealth: Payer: Self-pay | Admitting: Gynecologic Oncology

## 2020-07-04 NOTE — Telephone Encounter (Signed)
Discussed CT results with patient. CT showed negative nodes and no evidence of metastatic disease.   I am recommending bilateral inguinal lymphadenectomy to ensure there is no evidence of metastatic disease (microscopic) as this will determine the fields of radiation.  I counseled the patient about the nature of the procedure and anticipated postop course. We discussed drain placement. We discussed risk with special focus on infection risk and lymphedema. The patient feels comfortable proceeding with this.  If lymphadenectomy shows metastatic disease, she will need radiation to the groins. If negative, she will only require radiation to the primary site.   She will return later this week for a preop visit.  Thereasa Solo, MD

## 2020-07-04 NOTE — Progress Notes (Signed)
[place] Psychosocial Distress Screening Clinical Social Work  Clinical Social Work was referred by distress screening protocol.  The patient scored a 6 on the Psychosocial Distress Thermometer which indicates moderate distress. Clinical Social Worker contacted patient by phone to assess for distress and other psychosocial needs. Christina [name] reported main cause of stress/anxiety was awaiting treatment plan. Patient recently spoke with oncologist and expects phone call today to determine finalized plan. The patient has many strengths including strong support system, ability for insight, and healthy coping skills such as physical exercise and talking with friends.  CSW strongly encouraged patient to follow up if she begins to experience anxiety once treatment begins. Also encouraged to attend GYN cancer support group.  ONCBCN DISTRESS SCREENING 07/03/2020  Screening Type Initial Screening  Distress experienced in past week (1-10) 6  Emotional problem type Nervousness/Anxiety;Adjusting to illness;Boredom  Information Concerns Type Lack of info about treatment  Physical Problem type Pain;Nausea/vomiting;Sleep/insomnia;Loss of appetitie  Referral to clinical social work Yes    Clinical Social Worker follow up needed: No.  If yes, follow up plan:  Christina Maine, LCSW

## 2020-07-05 ENCOUNTER — Other Ambulatory Visit: Payer: Self-pay | Admitting: Gynecologic Oncology

## 2020-07-05 ENCOUNTER — Telehealth: Payer: Self-pay | Admitting: Oncology

## 2020-07-05 ENCOUNTER — Encounter: Payer: Self-pay | Admitting: Gynecologic Oncology

## 2020-07-05 DIAGNOSIS — C519 Malignant neoplasm of vulva, unspecified: Secondary | ICD-10-CM

## 2020-07-05 NOTE — Telephone Encounter (Signed)
Christina [name] called and had a question about why the lymph node surgery is not being done at [facility].  She said Dr. Denman George told her but she can't remember.    Called her back and advised that the mass is crossing the midline so she will not need sentinel lymph node sampling (which is done at [facility]).    She also asked how long she will have the drains in after surgery.  Advised her that the drains will stay in until she is only getting about 10 cc of drainage per day - about 2 weeks.  She verbalized understanding.

## 2020-07-06 ENCOUNTER — Encounter: Payer: Self-pay | Admitting: Gynecologic Oncology

## 2020-07-06 NOTE — Patient Instructions (Addendum)
DUE TO COVID-19 ONLY ONE VISITOR IS ALLOWED TO COME WITH YOU AND STAY IN THE WAITING ROOM ONLY DURING PRE OP AND PROCEDURE.   **NO VISITORS ARE ALLOWED IN THE SHORT STAY AREA OR RECOVERY ROOM!!**  IF YOU WILL BE ADMITTED INTO THE HOSPITAL YOU ARE ALLOWED ONLY TWO SUPPORT PEOPLE DURING VISITATION HOURS ONLY (10AM -8PM)   . The support person(s) may change daily. . The support person(s) must pass our screening, gel in and out, and wear a mask at all times, including in the patient's room. . Patients must also wear a mask when staff or their support person are in the room.  No visitors under the age of 25. Any visitor under the age of 1 must be accompanied by an adult.    COVID SWAB TESTING MUST BE COMPLETED ON:  Friday, 07-07-20 @ 1:20 PM   [address] [place], [place] [address]   . You are not required to quarantine, however you are required to wear a well-fitted mask when you are out and around people not in your household.  [facility]giene often . Do NOT share personal items . Notify your provider if you are in close contact with someone who has COVID or you develop fever 100.4 or greater, new onset of sneezing, cough, sore throat, shortness of breath or body aches.        Your procedure is scheduled on: Tuesday, 07-11-20   Report to [facility] Main  Entrance    Report to admitting at 10:15 AM   Call this number if you have problems the morning of surgery [phone]   Do not eat food :After Midnight.   May have liquids until 9:15 AM day of surgery  CLEAR LIQUID DIET  Foods Allowed                                                                     Foods Excluded  Water, Black Coffee and tea, regular and decaf              liquids that you cannot  Plain Jell-O in any flavor  (No red)                                     see through such as: Fruit ices (not with fruit pulp)                                      milk, soups, orange juice              Iced Popsicles (No  red)                                      All solid food                                   Apple juices Sports drinks like Gatorade (No red) Lightly seasoned clear broth  or consume(fat free) Sugar, honey syrup      Oral Hygiene is also important to reduce your risk of infection.                                    Remember - BRUSH YOUR TEETH THE MORNING OF SURGERY WITH YOUR REGULAR TOOTHPASTE   Do NOT smoke after Midnight   Take these medicines the morning of surgery with A SIP OF WATER: Cetirizine                               You may not have any metal on your body including hair pins, jewelry, and body piercings             Do not wear make-up, lotions, powders, perfumes/cologne, or deodorant             Do not wear nail polish.  Do not shave  48 hours prior to surgery.             Do not bring valuables to the hospital. [place].   Contacts, dentures or bridgework may not be worn into surgery.   Bring small overnight bag day of surgery.                Please read over the following fact sheets you were given: IF YOU HAVE QUESTIONS ABOUT YOUR PRE OP INSTRUCTIONS PLEASE CALL  [facility] - Preparing for Surgery Before surgery, you can play an important role.  Because skin is not sterile, your skin needs to be as free of germs as possible.  You can reduce the number of germs on your skin by washing with CHG (chlorahexidine gluconate) soap before surgery.  CHG is an antiseptic cleaner which kills germs and bonds with the skin to continue killing germs even after washing. Please DO NOT use if you have an allergy to CHG or antibacterial soaps.  If your skin becomes reddened/irritated stop using the CHG and inform your nurse when you arrive at Short Stay. Do not shave (including legs and underarms) for at least 48 hours prior to the first CHG shower.  You may shave your face/neck.  Please follow these instructions  carefully:  1.  Shower with CHG Soap the night before surgery and the  morning of surgery.  2.  If you choose to wash your hair, wash your hair first as usual with your normal  shampoo.  3.  After you shampoo, rinse your hair and body thoroughly to remove the shampoo.                             4.  Use CHG as you would any other liquid soap.  You can apply chg directly to the skin and wash.  Gently with a scrungie or clean washcloth.  5.  Apply the CHG Soap to your body ONLY FROM THE NECK DOWN.   Do   not use on face/ open                           Wound or open sores. Avoid contact with eyes, ears mouth and   genitals (private parts).  Wash face,  Genitals (private parts) with your normal soap.             6.  Wash thoroughly, paying special attention to the area where your    surgery  will be performed.  7.  Thoroughly rinse your body with warm water from the neck down.  8.  DO NOT shower/wash with your normal soap after using and rinsing off the CHG Soap.                9.  Pat yourself dry with a clean towel.            10.  Wear clean pajamas.            11.  Place clean sheets on your bed the night of your first shower and do not  sleep with pets. Day of Surgery : Do not apply any lotions/deodorants the morning of surgery.  Please wear clean clothes to the hospital/surgery center.  FAILURE TO FOLLOW THESE INSTRUCTIONS MAY RESULT IN THE CANCELLATION OF YOUR SURGERY  PATIENT SIGNATURE_________________________________  NURSE SIGNATURE__________________________________  ________________________________________________________________________  WHAT IS A BLOOD TRANSFUSION? Blood Transfusion Information  A transfusion is the replacement of blood or some of its parts. Blood is made up of multiple cells which provide different functions.  Red blood cells carry oxygen and are used for blood loss replacement.  White blood cells fight against infection.  Platelets  control bleeding.  Plasma helps clot blood.  Other blood products are available for specialized needs, such as hemophilia or other clotting disorders. BEFORE THE TRANSFUSION  Who gives blood for transfusions?   Healthy volunteers who are fully evaluated to make sure their blood is safe. This is blood bank blood. Transfusion therapy is the safest it has ever been in the practice of medicine. Before blood is taken from a donor, a complete history is taken to make sure that person has no history of diseases nor engages in risky social behavior (examples are intravenous drug use or sexual activity with multiple partners). The donor's travel history is screened to minimize risk of transmitting infections, such as malaria. The donated blood is tested for signs of infectious diseases, such as HIV and hepatitis. The blood is then tested to be sure it is compatible with you in order to minimize the chance of a transfusion reaction. If you or a relative donates blood, this is often done in anticipation of surgery and is not appropriate for emergency situations. It takes many days to process the donated blood. RISKS AND COMPLICATIONS Although transfusion therapy is very safe and saves many lives, the main dangers of transfusion include:   Getting an infectious disease.  Developing a transfusion reaction. This is an allergic reaction to something in the blood you were given. Every precaution is taken to prevent this. The decision to have a blood transfusion has been considered carefully by your caregiver before blood is given. Blood is not given unless the benefits outweigh the risks. AFTER THE TRANSFUSION  Right after receiving a blood transfusion, you will usually feel much better and more energetic. This is especially true if your red blood cells have gotten low (anemic). The transfusion raises the level of the red blood cells which carry oxygen, and this usually causes an energy increase.  The nurse  administering the transfusion will monitor you carefully for complications. HOME CARE INSTRUCTIONS  No special instructions are needed after a transfusion. You may find your energy is better. Speak with your  caregiver about any limitations on activity for underlying diseases you may have. SEEK MEDICAL CARE IF:   Your condition is not improving after your transfusion.  You develop redness or irritation at the intravenous (IV) site. SEEK IMMEDIATE MEDICAL CARE IF:  Any of the following symptoms occur over the next 12 hours:  Shaking chills.  You have a temperature by mouth above 102 F (38.9 C), not controlled by medicine.  Chest, back, or muscle pain.  People around you feel you are not acting correctly or are confused.  Shortness of breath or difficulty breathing.  Dizziness and fainting.  You get a rash or develop hives.  You have a decrease in urine output.  Your urine turns a dark color or changes to pink, red, or brown. Any of the following symptoms occur over the next 10 days:  You have a temperature by mouth above 102 F (38.9 C), not controlled by medicine.  Shortness of breath.  Weakness after normal activity.  The white part of the eye turns yellow (jaundice).  You have a decrease in the amount of urine or are urinating less often.  Your urine turns a dark color or changes to pink, red, or brown. Document Released: 02/02/2000 Document Revised: 04/29/2011 Document Reviewed: 09/21/2007 [facility] Patient Information 2014 [place], [place].  _______________________________________________________________________

## 2020-07-06 NOTE — Progress Notes (Addendum)
COVID Vaccine Completed:  Yes x4 Date COVID Vaccine completed:  04-04-19, 05-01-19, Has received booster:  12-13-19, 05-22-20 COVID vaccine manufacturer:     Moderna    Date of COVID positive in last 90 days: N/A  PCP - Shanon Ace, MD Cardiologist -   Chest x-ray - CT chest 06-30-20 EKG - 04-20-20 Epic Stress Test - N/A ECHO - N/A Cardiac Cath - N/A Pacemaker/ICD device last checked: Spinal Cord Stimulator:  Sleep Study - N/A CPAP -   Fasting Blood Sugar - N/A Checks Blood Sugar _____ times a day  Blood Thinner Instructions:  N/A Aspirin Instructions: Last Dose:  Activity level:   Can go up a flight of stairs and perform activities of daily living without stopping and without symptoms of chest pain or shortness of breath.  Able to exercise without symptoms, patient is very active.    Anesthesia review:  N/A  Patient denies shortness of breath, fever, cough and chest pain at PAT appointment   Patient verbalized understanding of instructions that were given to them at the PAT appointment. Patient was also instructed that they will need to review over the PAT instructions again at home before surgery.

## 2020-07-07 ENCOUNTER — Encounter (HOSPITAL_COMMUNITY): Payer: Self-pay

## 2020-07-07 ENCOUNTER — Other Ambulatory Visit (HOSPITAL_COMMUNITY)
Admission: RE | Admit: 2020-07-07 | Discharge: 2020-07-07 | Disposition: A | Discharge status: Home / Self Care | Payer: 59 | Source: Ambulatory Visit | Attending: Gynecologic Oncology | Admitting: Gynecologic Oncology

## 2020-07-07 ENCOUNTER — Inpatient Hospital Stay (HOSPITAL_BASED_OUTPATIENT_CLINIC_OR_DEPARTMENT_OTHER): Payer: 59 | Admitting: Gynecologic Oncology

## 2020-07-07 ENCOUNTER — Other Ambulatory Visit: Payer: Self-pay

## 2020-07-07 ENCOUNTER — Encounter (HOSPITAL_COMMUNITY)
Admission: RE | Admit: 2020-07-07 | Discharge: 2020-07-07 | Disposition: A | Discharge status: Home / Self Care | Payer: 59 | Source: Ambulatory Visit | Attending: Gynecologic Oncology | Admitting: Gynecologic Oncology

## 2020-07-07 VITALS — BP 136/72 | HR 92 | Temp 97.1°F | Resp 20 | Wt 132.0 lb

## 2020-07-07 DIAGNOSIS — Z01812 Encounter for preprocedural laboratory examination: Secondary | ICD-10-CM | POA: Insufficient documentation

## 2020-07-07 DIAGNOSIS — Z20822 Contact with and (suspected) exposure to covid-19: Secondary | ICD-10-CM | POA: Insufficient documentation

## 2020-07-07 DIAGNOSIS — C519 Malignant neoplasm of vulva, unspecified: Secondary | ICD-10-CM

## 2020-07-07 HISTORY — DX: Other chronic pain: G89.29

## 2020-07-07 HISTORY — DX: Malignant neoplasm of vulva, unspecified: C51.9

## 2020-07-07 LAB — CBC
HCT: 41.2 % (ref 36.0–46.0)
Hemoglobin: 13.1 g/dL (ref 12.0–15.0)
MCH: 29 pg (ref 26.0–34.0)
MCHC: 31.8 g/dL (ref 30.0–36.0)
MCV: 91.4 fL (ref 80.0–100.0)
Platelets: 434 10*3/uL — ABNORMAL HIGH (ref 150–400)
RBC: 4.51 MIL/uL (ref 3.87–5.11)
RDW: 13.4 % (ref 11.5–15.5)
WBC: 8.5 10*3/uL (ref 4.0–10.5)
nRBC: 0 % (ref 0.0–0.2)

## 2020-07-07 LAB — COMPREHENSIVE METABOLIC PANEL
ALT: 21 U/L (ref 0–44)
AST: 20 U/L (ref 15–41)
Albumin: 4.3 g/dL (ref 3.5–5.0)
Alkaline Phosphatase: 49 U/L (ref 38–126)
Anion gap: 8 (ref 5–15)
BUN: 14 mg/dL (ref 8–23)
CO2: 26 mmol/L (ref 22–32)
Calcium: 9.6 mg/dL (ref 8.9–10.3)
Chloride: 104 mmol/L (ref 98–111)
Creatinine, Ser: 0.71 mg/dL (ref 0.44–1.00)
GFR, Estimated: >60 mL/min (ref >60)
Glucose, Bld: 98 mg/dL (ref 70–99)
Potassium: 3.7 mmol/L (ref 3.5–5.1)
Sodium: 138 mmol/L (ref 135–145)
Total Bilirubin: 0.6 mg/dL (ref 0.3–1.2)
Total Protein: 7.3 g/dL (ref 6.5–8.1)

## 2020-07-07 LAB — SARS CORONAVIRUS 2 (TAT 6-24 HRS): SARS Coronavirus 2: NEGATIVE

## 2020-07-07 MED ORDER — SENNOSIDES-DOCUSATE SODIUM 8.6-50 MG PO TABS: 2.0000 | ORAL_TABLET | Freq: Every day | ORAL | 0 refills | Status: DC

## 2020-07-07 MED ORDER — IBUPROFEN 600 MG PO TABS: 600.0000 mg | ORAL_TABLET | Freq: Four times a day (QID) | ORAL | 0 refills | Status: DC | PRN

## 2020-07-07 MED ORDER — OXYCODONE HCL 5 MG PO TABS: 5.0000 mg | ORAL_TABLET | ORAL | 0 refills | Status: DC | PRN

## 2020-07-07 NOTE — Progress Notes (Signed)
Patient here with her husband for a pre-operative appointment prior to her scheduled surgery on Jul 11, 2020. She is scheduled for a bilateral inguinal lymphadenectomy.  She had her pre-admission testing appointment this am at [facility].  The surgery was discussed in detail.  See after visit summary for additional details. Visual aids used to discuss items related to surgery including the incentive spirometer, sequential compression stockings, IV pump, multi-modal pain regimen including tylenol, lymphatic system to discuss surgery in detail.      Discussed post-op pain management in detail including the aspects of the enhanced recovery pathway.  Advised her that a new prescription would be sent in for oxycodone and it is only to be used for after her upcoming surgery.  We discussed the use of tylenol post-op and to monitor for a maximum of 4,000 mg in a 24 hour period. Ibuprofen prescribed as well. Also prescribed sennakot to be used after surgery and to hold if having loose stools.  Discussed bowel regimen in detail.     Discussed the use of SCDs, and measures to take at home to prevent DVT including frequent mobility.  Reportable signs and symptoms of DVT discussed. Post-operative instructions discussed and expectations for after surgery. Incisional care discussed as well including reportable signs and symptoms including erythema, drainage, wound separation.  JP drain care discussed. Recording sheets given to the patient. Instructed patient on emptying the drain and charging the drain.      10 minutes spent with the patient.  Verbalizing understanding of material discussed. No needs or concerns voiced at the end of the visit.   Advised patient and family to call for any needs.  Advised that her post-operative medications had been prescribed and could be picked up at any time.    This appointment is a preop appt prior to surgery included in the global bundle.

## 2020-07-07 NOTE — Patient Instructions (Addendum)
Preparing for your Surgery  Plan for surgery on Jul 11, 2020 with Dr. Everitt Amber at [facility] will be scheduled for a bilateral inguinal lymphadenectomy (removal of lymph nodes in the right and left groin region).   Plan for an overnight stay in the hospital. You will be discharged home with two JP drains in place, one coming from the right and left.  You will have a white honeycomb dressing over your incisions. These dressings can be removed 5-7 days after surgery and you do not need to reapply a new dressing. Once you remove the dressing, you will notice that you have the surgical glue (dermabond) on the incisions and this will peel off on its own. You can get this dressing wet in the shower the days after surgery prior to removal on the 5th day.   Pre-operative Testing -You will receive a phone call from presurgical testing at [facility] to arrange for a pre-operative appointment, labs, and COVID test. The COVID test normally happens 3 days prior to the surgery and they ask that you self quarantine after the test up until surgery to decrease chance of exposure.  -Bring your insurance card, copy of an advanced directive if applicable, medication list  -At that visit, you will be asked to sign a consent for a possible blood transfusion in case a transfusion becomes necessary during surgery.  The need for a blood transfusion is rare but having consent is a necessary part of your care.     -You should not be taking blood thinners or aspirin at least ten days prior to surgery unless instructed by your surgeon.  -Do not take supplements such as fish oil (omega 3), red yeast rice, turmeric before your surgery. You want to avoid medications with aspirin in them including headache powders such as BC or Goody's), Excedrin migraine.  Day Before Surgery at [place] will be advised you can have clear liquids up until 3 hours before your surgery.    Your role in recovery Your  role is to become active as soon as directed by your doctor, while still giving yourself time to heal.  Rest when you feel tired. You will be asked to do the following in order to speed your recovery:  - Cough and breathe deeply. This helps to clear and expand your lungs and can prevent pneumonia after surgery.  - [place]. Do mild physical activity. Walking or moving your legs help your circulation and body functions return to normal. Do not try to get up or walk alone the first time after surgery.   -If you develop swelling on one leg or the other, pain in the back of your leg, redness/warmth in one of your legs, please call the office or go to the Emergency Room to have a doppler to rule out a blood clot. For shortness of breath, chest pain-seek care in the Emergency Room as soon as possible. - Actively manage your pain. Managing your pain lets you move in comfort. We will ask you to rate your pain on a scale of zero to 10. It is your responsibility to tell your doctor or nurse where and how much you hurt so your pain can be treated.  Special Considerations -Your final pathology results from surgery should be available around one week after surgery and the results will be relayed to you when available.  -Dr. Lahoma Crocker is the surgeon that assists your GYN Oncologist with  surgery.  If you end up staying the night, the next day after your surgery you will either see Dr. Denman George, Dr. Berline Lopes, or Dr. Lahoma Crocker.  -FMLA forms can be faxed to [phone] and please allow 5-7 business days for completion.  Pain Management After Surgery -You have been prescribed your pain medication and bowel regimen medications before surgery so that you can have these available when you are discharged from the hospital. The pain medication is for use ONLY AFTER surgery and a new prescription will not be given.   -Make sure that you have Tylenol and Ibuprofen at home to use on a  regular basis after surgery for pain control. We recommend alternating the medications every hour to six hours since they work differently and are processed in the body differently for pain relief.  -Review the attached handout on narcotic use and their risks and side effects.   Bowel Regimen -You have been prescribed Sennakot-S to take nightly to prevent constipation especially if you are taking the narcotic pain medication intermittently.  It is important to prevent constipation and drink adequate amounts of liquids. You can stop taking this medication when you are not taking pain medication and you are back on your normal bowel routine.  Risks of Surgery Risks of surgery are low but include bleeding, infection, damage to surrounding structures, re-operation, blood clots, and very rarely death.   Blood Transfusion Information (For the consent to be signed before surgery)  We will be checking your blood type before surgery so in case of emergencies, we will know what type of blood you would need.                                            WHAT IS A BLOOD TRANSFUSION?  A transfusion is the replacement of blood or some of its parts. Blood is made up of multiple cells which provide different functions.  Red blood cells carry oxygen and are used for blood loss replacement.  White blood cells fight against infection.  Platelets control bleeding.  Plasma helps clot blood.  Other blood products are available for specialized needs, such as hemophilia or other clotting disorders. BEFORE THE TRANSFUSION  Who gives blood for transfusions?   You may be able to donate blood to be used at a later date on yourself (autologous donation).  Relatives can be asked to donate blood. This is generally not any safer than if you have received blood from a stranger. The same precautions are taken to ensure safety when a relative's blood is donated.  Healthy volunteers who are fully evaluated to make sure  their blood is safe. This is blood bank blood. Transfusion therapy is the safest it has ever been in the practice of medicine. Before blood is taken from a donor, a complete history is taken to make sure that person has no history of diseases nor engages in risky social behavior (examples are intravenous drug use or sexual activity with multiple partners). The donor's travel history is screened to minimize risk of transmitting infections, such as malaria. The donated blood is tested for signs of infectious diseases, such as HIV and hepatitis. The blood is then tested to be sure it is compatible with you in order to minimize the chance of a transfusion reaction. If you or a relative donates blood, this is often done in anticipation of surgery  and is not appropriate for emergency situations. It takes many days to process the donated blood. RISKS AND COMPLICATIONS Although transfusion therapy is very safe and saves many lives, the main dangers of transfusion include:   Getting an infectious disease.  Developing a transfusion reaction. This is an allergic reaction to something in the blood you were given. Every precaution is taken to prevent this. The decision to have a blood transfusion has been considered carefully by your caregiver before blood is given. Blood is not given unless the benefits outweigh the risks.  AFTER SURGERY INSTRUCTIONS  Return to work: 2-4 weeks if applicable  Activity: 1. Be up and out of the bed during the day.  Take a nap if needed.  You may walk up steps but be careful and use the hand rail.  Stair climbing will tire you more than you think, you may need to stop part way and rest.   2. No lifting or straining for 4 weeks over 10 pounds. No pushing, pulling, straining for 4 weeks.  3. No driving for 1 week(s).  Do not drive if you are taking narcotic pain medicine and make sure that your reaction time has returned.   4. You can shower as soon as the next day after surgery.  Shower daily.  Use your regular soap and water (not directly on the incision) and pat your incision(s) dry afterwards; don't rub.  No tub baths or submerging your body in water until cleared by your surgeon. If you have the soap that was given to you by pre-surgical testing that was used before surgery, you do not need to use it afterwards because this can irritate your incisions.   5. You may experience a small amount of clear drainage from your incisions, which is normal.  If the drainage persists, increases, or changes color please call the office.  6. Do not use creams, lotions, or ointments such as neosporin on your incisions after surgery until advised by your surgeon because they can cause removal of the dermabond glue on your incisions.    7. Take Tylenol or ibuprofen first for pain and only use narcotic pain medication for severe pain not relieved by the Tylenol or Ibuprofen.  Monitor your Tylenol intake to a max of 4,000 mg in a 24 hour period. You can alternate these medications after surgery.  Diet: 1. Low sodium Heart Healthy Diet is recommended but you are cleared to resume your normal (before surgery) diet after your procedure.  2. It is safe to use a laxative, such as Miralax or Colace, if you have difficulty moving your bowels. You have been prescribed Sennakot at bedtime every evening to keep bowel movements regular and to prevent constipation.    Wound Care: 1. Keep clean and dry.  Shower daily.  Reasons to call the Doctor:  Fever - Oral temperature greater than 100.4 degrees Fahrenheit  Foul-smelling vaginal discharge  Difficulty urinating  Nausea and vomiting  Increased pain at the site of the incision that is unrelieved with pain medicine.  Difficulty breathing with or without chest pain  New calf pain especially if only on one side  Sudden, continuing increased vaginal bleeding with or without clots.   Contacts: For questions or concerns you should  contact:  Dr. Everitt Amber at [phone]  [name], NP at [phone]  After Hours: call [phone] and have the GYN Oncologist paged/contacted (after 5 pm or on the weekends).  Messages sent via mychart are for non-urgent matters  and are not responded to after hours so for urgent needs, please call the after hours number.   Surgical [facility] Surgical drains are used to remove extra fluid that normally builds up in a surgical wound after surgery. A surgical drain helps to heal a surgical wound. Different kinds of surgical drains include:  Active drains. These drains use suction to pull drainage away from the surgical wound. Drainage flows through a tube to a container outside of the body. With these drains, you need to keep the bulb or the drainage container flat (compressed) at all times, except while you empty it. Flattening the bulb or container creates suction.  Passive drains. These drains allow fluid to drain naturally, by gravity. Drainage flows through a tube to a bandage (dressing) or a container outside of the body. Passive drains do not need to be emptied. A drain is placed during surgery. Right after surgery, drainage is usually bright red and a little thicker than water. The drainage may gradually turn yellow or pink and become thinner. It is likely that your health care provider will remove the drain when the drainage stops or when the amount decreases to 1-2 Tbsp (15-30 mL) during a 24-hour period. Supplies needed:  Tape.  Germ-free cleaning solution (sterile saline).  Cotton swabs.  Split gauze drain sponge: 4 x 4 inches (10 x 10 cm).  Gauze square: 4 x 4 inches (10 x 10 cm). How to care for your surgical drain Care for your drain as told by your health care provider. This is important to help prevent infection. If your drain is placed at your back, or any other hard-to-reach area, ask another person to assist you in performing the following  tasks: General care  Keep the skin around the drain dry and covered with a dressing at all times.  Check your drain area every day for signs of infection. Check for: ? Redness, swelling, or pain. ? Pus or a bad smell. ? Cloudy drainage. ? Tenderness or pressure at the drain exit site. Changing the dressing Follow instructions from your health care provider about how to change your dressing. Change your dressing at least once a day. Change it more often if needed to keep the dressing dry. Make sure you: 1. Gather your supplies. 2. Wash your hands with soap and water before you change your dressing. If soap and water are not available, use hand sanitizer. 3. Remove the old dressing. Avoid using scissors to do that. 4. Wash your hands with soap and water again after removing the old dressing. 5. Use sterile saline to clean your skin around the drain. You may need to use a cotton swab to clean the skin. 6. Place the tube through the slit in a drain sponge. Place the drain sponge so that it covers your wound. 7. Place the gauze square or another drain sponge on top of the drain sponge that is on the wound. Make sure the tube is between those layers. 8. Tape the dressing to your skin. 9. Tape the drainage tube to your skin 1-2 inches (2.5-5 cm) below the place where the tube enters your body. Taping keeps the tube from pulling on any stitches (sutures) that you have. 10. Wash your hands with soap and water. 11. Write down the color of your drainage and how often you change your dressing. How to empty your active drain 1. Make sure that you have a measuring cup that you can empty your drainage into. 2.  Wash your hands with soap and water. If soap and water are not available, use hand sanitizer. 3. Loosen any pins or clips that hold the tube in place. 4. If your health care provider tells you to strip the tube to prevent clots and tube blockages: ? Hold the tube at the skin with one hand. Use  your other hand to pinch the tubing with your thumb and first finger. ? Gently move your fingers down the tube while squeezing very lightly. This clears any drainage, clots, or tissue from the tube. ? You may need to do this several times each day to keep the tube clear. Do not pull on the tube. 5. Open the bulb cap or the drain plug. Do not touch the inside of the cap or the bottom of the plug. 6. Turn the device upside down and gently squeeze. 7. Empty all of the drainage into the measuring cup. 8. Compress the bulb or the container and replace the cap or the plug. To compress the bulb or the container, squeeze it firmly in the middle while you close the cap or plug the container. 9. Write down the amount of drainage that you have in each 24-hour period. If you have less than 2 Tbsp (30 mL) of drainage during 24 hours, contact your health care provider. 10. Flush the drainage down the toilet. 11. Wash your hands with soap and water.   Contact a health care provider if:  You have redness, swelling, or pain around your drain area.  You have pus or a bad smell coming from your drain area.  You have a fever or chills.  The skin around your drain is warm to the touch.  The amount of drainage that you have is increasing instead of decreasing.  You have drainage that is cloudy.  There is a sudden stop or a sudden decrease in the amount of drainage that you have.  Your drain tube falls out.  Your active drain does not stay compressed after you empty it. Summary  Surgical drains are used to remove extra fluid that normally builds up in a surgical wound after surgery.  Different kinds of surgical drains include active drains and passive drains. Active drains use suction to pull drainage away from the surgical wound, and passive drains allow fluid to drain naturally.  It is important to care for your drain to prevent infection. If your drain is placed at your back, or any other  hard-to-reach area, ask another person to assist you.  Contact your health care provider if you have redness, swelling, or pain around your drain area. This information is not intended to replace advice given to you by your health care provider. Make sure you discuss any questions you have with your health care provider. Document Revised: 03/11/2018 Document Reviewed: 03/11/2018 Elsevier Patient Education  2021 [place] Record  Empty your surgical drain as told by your health care provider. Use this form to write down the amount of fluid that has collected in the drainage container. Bring this form with you to your follow-up visits. Surgical drain #1 location: ___________________ Date __________ Time __________ Amount __________ Date __________ Time __________ Amount __________ Date __________ Time __________ Amount __________ Date __________ Time __________ Amount __________ Date __________ Time __________ Amount __________ Date __________ Time __________ Amount __________ Date __________ Time __________ Amount __________ Date __________ Time __________ Amount __________ Date __________ Time __________ Amount __________ Date __________ Time __________ Amount __________ Date  __________ Time __________ Amount __________ Date __________ Time __________ Amount __________ Date __________ Time __________ Amount __________ Date __________ Time __________ Amount __________ Date __________ Time __________ Amount __________ Date __________ Time __________ Amount __________ Date __________ Time __________ Amount __________ Date __________ Time __________ Amount __________ Date __________ Time __________ Amount __________ Date __________ Time __________ Amount __________ Date __________ Time __________ Amount __________ Surgical drain #2 location: ___________________ Date __________ Time __________ Amount __________ Date __________ Time __________ Amount  __________ Date __________ Time __________ Amount __________ Date __________ Time __________ Amount __________ Date __________ Time __________ Amount __________ Date __________ Time __________ Amount __________ Date __________ Time __________ Amount __________ Date __________ Time __________ Amount __________ Date __________ Time __________ Amount __________ Date __________ Time __________ Amount __________ Date __________ Time __________ Amount __________ Date __________ Time __________ Amount __________ Date __________ Time __________ Amount __________ Date __________ Time __________ Amount __________ Date __________ Time __________ Amount __________ Date __________ Time __________ Amount __________ Date __________ Time __________ Amount __________ Date __________ Time __________ Amount __________ Date __________ Time __________ Amount __________ Date __________ Time __________ Amount __________ Date __________ Time __________ Amount __________ This information is not intended to replace advice given to you by your health care provider. Make sure you discuss any questions you have with your health care provider. Document Revised: 03/22/2019 Document Reviewed: 11/11/2016 Elsevier Patient Education  2021 [employer].

## 2020-07-09 ENCOUNTER — Other Ambulatory Visit: Payer: Self-pay | Admitting: Internal Medicine

## 2020-07-10 ENCOUNTER — Telehealth: Payer: Self-pay | Admitting: *Deleted

## 2020-07-10 ENCOUNTER — Other Ambulatory Visit: Payer: Self-pay | Admitting: Gynecologic Oncology

## 2020-07-10 MED ORDER — LIDOCAINE VISCOUS HCL 2 % MT SOLN: OROMUCOSAL | 0 refills | Status: DC

## 2020-07-10 NOTE — Telephone Encounter (Signed)
PC to patient to review pre op instructions.  Pt does not have any questions, aware of arrival time, NPO instructions, understands CHG shower instructions & she is to take her Zyrtec in the morning.  Instructed patient to call GYN office with any questions today [phone] and to call [phone] in the morning with any surgical questions.  Patient verbalizes understanding.

## 2020-07-10 NOTE — Telephone Encounter (Signed)
Last OV: 06/12/2020 Last Refill: 06/12/2020 Disp: 84ml   R: 0 Future OV: None scheduled.

## 2020-07-10 NOTE — H&P (View-Only) (Signed)
Refilled topical lidocaine.

## 2020-07-10 NOTE — Progress Notes (Signed)
Refilled topical lidocaine.

## 2020-07-11 ENCOUNTER — Encounter (HOSPITAL_COMMUNITY)
Admission: RE | Disposition: A | Discharge status: Home / Self Care | Payer: Self-pay | Source: Home / Self Care | Attending: Gynecologic Oncology

## 2020-07-11 ENCOUNTER — Observation Stay (HOSPITAL_COMMUNITY): Payer: 59 | Admitting: Anesthesiology

## 2020-07-11 ENCOUNTER — Other Ambulatory Visit: Payer: Self-pay

## 2020-07-11 ENCOUNTER — Encounter (HOSPITAL_COMMUNITY): Payer: Self-pay | Admitting: Gynecologic Oncology

## 2020-07-11 ENCOUNTER — Ambulatory Visit (HOSPITAL_COMMUNITY)
Admission: RE | Admit: 2020-07-11 | Discharge: 2020-07-12 | Disposition: A | Discharge status: Home / Self Care | Payer: 59 | Attending: Gynecologic Oncology | Admitting: Gynecologic Oncology

## 2020-07-11 DIAGNOSIS — C519 Malignant neoplasm of vulva, unspecified: Secondary | ICD-10-CM | POA: Diagnosis not present

## 2020-07-11 DIAGNOSIS — C518 Malignant neoplasm of overlapping sites of vulva: Secondary | ICD-10-CM | POA: Insufficient documentation

## 2020-07-11 DIAGNOSIS — Z96651 Presence of right artificial knee joint: Secondary | ICD-10-CM | POA: Insufficient documentation

## 2020-07-11 DIAGNOSIS — Z79899 Other long term (current) drug therapy: Secondary | ICD-10-CM | POA: Insufficient documentation

## 2020-07-11 DIAGNOSIS — Z803 Family history of malignant neoplasm of breast: Secondary | ICD-10-CM | POA: Diagnosis not present

## 2020-07-11 DIAGNOSIS — Z8042 Family history of malignant neoplasm of prostate: Secondary | ICD-10-CM | POA: Insufficient documentation

## 2020-07-11 DIAGNOSIS — C774 Secondary and unspecified malignant neoplasm of inguinal and lower limb lymph nodes: Secondary | ICD-10-CM | POA: Diagnosis not present

## 2020-07-11 DIAGNOSIS — Z87891 Personal history of nicotine dependence: Secondary | ICD-10-CM | POA: Diagnosis not present

## 2020-07-11 HISTORY — PX: INGUINAL LYMPHADENECTOMY: SHX6587

## 2020-07-11 LAB — ABO/RH: ABO/RH(D): A POS

## 2020-07-11 LAB — TYPE AND SCREEN
ABO/RH(D): A POS
Antibody Screen: NEGATIVE

## 2020-07-11 SURGERY — LYMPHADENECTOMY, INGUINAL, OPEN
Anesthesia: General | Laterality: Bilateral

## 2020-07-11 MED ORDER — ONDANSETRON HCL 4 MG/2ML IJ SOLN
INTRAMUSCULAR | Status: AC
  Filled 2020-07-11: qty 2

## 2020-07-11 MED ORDER — LIDOCAINE 2% (20 MG/ML) 5 ML SYRINGE
INTRAMUSCULAR | Status: DC | PRN
  Administered 2020-07-11: 50 mg via INTRAVENOUS

## 2020-07-11 MED ORDER — LIDOCAINE 2% (20 MG/ML) 5 ML SYRINGE
INTRAMUSCULAR | Status: AC
  Filled 2020-07-11: qty 5

## 2020-07-11 MED ORDER — ACETAMINOPHEN 500 MG PO TABS
1000.0000 mg | ORAL_TABLET | Freq: Once | ORAL | Status: AC
  Administered 2020-07-11: 1000 mg via ORAL

## 2020-07-11 MED ORDER — ACETAMINOPHEN 500 MG PO TABS
1000.0000 mg | ORAL_TABLET | Freq: Four times a day (QID) | ORAL | Status: DC
  Administered 2020-07-11 – 2020-07-12 (×3): 1000 mg via ORAL
  Filled 2020-07-11 (×3): qty 2

## 2020-07-11 MED ORDER — DEXAMETHASONE SODIUM PHOSPHATE 10 MG/ML IJ SOLN
INTRAMUSCULAR | Status: AC
  Filled 2020-07-11: qty 1

## 2020-07-11 MED ORDER — ORAL CARE MOUTH RINSE: 15.0000 mL | Freq: Once | OROMUCOSAL | Status: AC

## 2020-07-11 MED ORDER — PROPOFOL 1000 MG/100ML IV EMUL
INTRAVENOUS | Status: AC
  Filled 2020-07-11: qty 100

## 2020-07-11 MED ORDER — MIDAZOLAM HCL 2 MG/2ML IJ SOLN
INTRAMUSCULAR | Status: DC | PRN
  Administered 2020-07-11: 2 mg via INTRAVENOUS

## 2020-07-11 MED ORDER — BUPIVACAINE LIPOSOME 1.3 % IJ SUSP
20.0000 mL | Freq: Once | INTRAMUSCULAR | Status: AC
  Administered 2020-07-11: 20 mL
  Filled 2020-07-11: qty 20

## 2020-07-11 MED ORDER — ONDANSETRON HCL 4 MG/2ML IJ SOLN: 4.0000 mg | Freq: Four times a day (QID) | INTRAMUSCULAR | Status: DC | PRN

## 2020-07-11 MED ORDER — ACETAMINOPHEN 500 MG PO TABS
1000.0000 mg | ORAL_TABLET | ORAL | Status: DC
  Filled 2020-07-11: qty 2

## 2020-07-11 MED ORDER — LACTATED RINGERS IV SOLN: INTRAVENOUS | Status: DC

## 2020-07-11 MED ORDER — FENTANYL CITRATE (PF) 100 MCG/2ML IJ SOLN
INTRAMUSCULAR | Status: AC
  Filled 2020-07-11: qty 2

## 2020-07-11 MED ORDER — ONDANSETRON HCL 4 MG/2ML IJ SOLN
INTRAMUSCULAR | Status: DC | PRN
  Administered 2020-07-11: 4 mg via INTRAVENOUS

## 2020-07-11 MED ORDER — DEXAMETHASONE SODIUM PHOSPHATE 4 MG/ML IJ SOLN
4.0000 mg | INTRAMUSCULAR | Status: AC
  Administered 2020-07-11: 5 mg via INTRAVENOUS

## 2020-07-11 MED ORDER — LORAZEPAM 0.5 MG PO TABS
0.5000 mg | ORAL_TABLET | Freq: Three times a day (TID) | ORAL | Status: DC | PRN
Start: 2020-07-11 — End: 2020-07-12

## 2020-07-11 MED ORDER — KETAMINE HCL 10 MG/ML IJ SOLN
INTRAMUSCULAR | Status: DC | PRN
  Administered 2020-07-11: 30 mg via INTRAVENOUS

## 2020-07-11 MED ORDER — SCOPOLAMINE 1 MG/3DAYS TD PT72
1.0000 | MEDICATED_PATCH | TRANSDERMAL | Status: DC
  Administered 2020-07-11: 1.5 mg via TRANSDERMAL
  Filled 2020-07-11: qty 1

## 2020-07-11 MED ORDER — GABAPENTIN 300 MG PO CAPS
300.0000 mg | ORAL_CAPSULE | ORAL | Status: AC
  Administered 2020-07-11: 300 mg via ORAL
  Filled 2020-07-11: qty 1

## 2020-07-11 MED ORDER — HYDROMORPHONE HCL 1 MG/ML IJ SOLN: 0.2500 mg | INTRAMUSCULAR | Status: DC | PRN

## 2020-07-11 MED ORDER — SODIUM CHLORIDE 0.9 % IR SOLN
Status: DC | PRN
  Administered 2020-07-11: 1000 mL

## 2020-07-11 MED ORDER — PROPOFOL 10 MG/ML IV BOLUS
INTRAVENOUS | Status: AC
  Filled 2020-07-11: qty 20

## 2020-07-11 MED ORDER — EPHEDRINE SULFATE-NACL 50-0.9 MG/10ML-% IV SOSY
PREFILLED_SYRINGE | INTRAVENOUS | Status: DC | PRN
  Administered 2020-07-11 (×3): 5 mg via INTRAVENOUS

## 2020-07-11 MED ORDER — HYDROMORPHONE HCL 2 MG/ML IJ SOLN
INTRAMUSCULAR | Status: AC
  Filled 2020-07-11: qty 1

## 2020-07-11 MED ORDER — IBUPROFEN 400 MG PO TABS: 600.0000 mg | ORAL_TABLET | Freq: Four times a day (QID) | ORAL | Status: DC | PRN

## 2020-07-11 MED ORDER — BUPIVACAINE HCL 0.25 % IJ SOLN
INTRAMUSCULAR | Status: DC | PRN
  Administered 2020-07-11: 20 mL

## 2020-07-11 MED ORDER — HYDROMORPHONE HCL 1 MG/ML IJ SOLN
INTRAMUSCULAR | Status: DC | PRN
  Administered 2020-07-11 (×3): .5 mg via INTRAVENOUS

## 2020-07-11 MED ORDER — LEVOCETIRIZINE DIHYDROCHLORIDE 5 MG PO TABS: 5.0000 mg | ORAL_TABLET | Freq: Every evening | ORAL | Status: DC

## 2020-07-11 MED ORDER — CEFAZOLIN SODIUM-DEXTROSE 2-4 GM/100ML-% IV SOLN
2.0000 g | INTRAVENOUS | Status: AC
Start: 2020-07-11 — End: 2020-07-11
  Administered 2020-07-11: 2 g via INTRAVENOUS
  Filled 2020-07-11: qty 100

## 2020-07-11 MED ORDER — FENTANYL CITRATE (PF) 100 MCG/2ML IJ SOLN
INTRAMUSCULAR | Status: DC | PRN
  Administered 2020-07-11 (×4): 50 ug via INTRAVENOUS

## 2020-07-11 MED ORDER — CHLORHEXIDINE GLUCONATE 0.12 % MT SOLN
15.0000 mL | Freq: Once | OROMUCOSAL | Status: AC
  Administered 2020-07-11: 15 mL via OROMUCOSAL

## 2020-07-11 MED ORDER — BUPIVACAINE HCL 0.25 % IJ SOLN
INTRAMUSCULAR | Status: AC
  Filled 2020-07-11: qty 1

## 2020-07-11 MED ORDER — ONDANSETRON HCL 4 MG PO TABS: 4.0000 mg | ORAL_TABLET | Freq: Four times a day (QID) | ORAL | Status: DC | PRN

## 2020-07-11 MED ORDER — LOSARTAN POTASSIUM 50 MG PO TABS
50.0000 mg | ORAL_TABLET | Freq: Every day | ORAL | Status: DC
  Administered 2020-07-12: 50 mg via ORAL
  Filled 2020-07-11: qty 1

## 2020-07-11 MED ORDER — FENTANYL CITRATE (PF) 100 MCG/2ML IJ SOLN: 25.0000 ug | INTRAMUSCULAR | Status: DC | PRN

## 2020-07-11 MED ORDER — MIDAZOLAM HCL 2 MG/2ML IJ SOLN
INTRAMUSCULAR | Status: AC
  Filled 2020-07-11: qty 2

## 2020-07-11 MED ORDER — OXYCODONE HCL 5 MG PO TABS: 5.0000 mg | ORAL_TABLET | ORAL | Status: DC | PRN

## 2020-07-11 MED ORDER — HYDROMORPHONE HCL 1 MG/ML IJ SOLN: 0.5000 mg | INTRAMUSCULAR | Status: DC | PRN

## 2020-07-11 MED ORDER — CELECOXIB 200 MG PO CAPS
400.0000 mg | ORAL_CAPSULE | ORAL | Status: AC
  Administered 2020-07-11: 400 mg via ORAL
  Filled 2020-07-11: qty 2

## 2020-07-11 MED ORDER — PROPOFOL 10 MG/ML IV BOLUS
INTRAVENOUS | Status: DC | PRN
  Administered 2020-07-11: 150 mg via INTRAVENOUS
  Administered 2020-07-11: 50 mg via INTRAVENOUS

## 2020-07-11 MED ORDER — LORATADINE 10 MG PO TABS
10.0000 mg | ORAL_TABLET | Freq: Every day | ORAL | Status: DC
  Administered 2020-07-12: 10 mg via ORAL
  Filled 2020-07-11: qty 1

## 2020-07-11 MED ORDER — SENNOSIDES-DOCUSATE SODIUM 8.6-50 MG PO TABS
2.0000 | ORAL_TABLET | Freq: Every day | ORAL | Status: DC
Start: 2020-07-11 — End: 2020-07-12

## 2020-07-11 SURGICAL SUPPLY — 54 items
BACTOSHIELD CHG 4% 4OZ (MISCELLANEOUS)
BNDG GAUZE ELAST 4 BULKY (GAUZE/BANDAGES/DRESSINGS) ×2 IMPLANT
CHLORAPREP W/TINT 26 (MISCELLANEOUS) ×2 IMPLANT
CLIP VESOCCLUDE LG 6/CT (CLIP) IMPLANT
CLIP VESOCCLUDE MED 6/CT (CLIP) ×4 IMPLANT
CLIP VESOCCLUDE MED LG 6/CT (CLIP) IMPLANT
CLIP VESOCCLUDE SM WIDE 6/CT (CLIP) ×4 IMPLANT
CNTNR URN SCR LID CUP LEK RST (MISCELLANEOUS) ×2 IMPLANT
CONT SPEC 4OZ STRL OR WHT (MISCELLANEOUS) ×2
COVER SURGICAL LIGHT HANDLE (MISCELLANEOUS) ×2 IMPLANT
COVER WAND RF STERILE (DRAPES) IMPLANT
DERMABOND ADVANCED (GAUZE/BANDAGES/DRESSINGS) ×1
DERMABOND ADVANCED .7 DNX12 (GAUZE/BANDAGES/DRESSINGS) ×1 IMPLANT
DRAIN CHANNEL 10F 3/8 F FF (DRAIN) ×4 IMPLANT
DRAPE SHEET LG 3/4 BI-LAMINATE (DRAPES) ×6 IMPLANT
DRAPE UTILITY XL STRL (DRAPES) ×2 IMPLANT
DRSG OPSITE POSTOP 4X6 (GAUZE/BANDAGES/DRESSINGS) ×4 IMPLANT
DRSG TEGADERM 2-3/8X2-3/4 SM (GAUZE/BANDAGES/DRESSINGS) ×4 IMPLANT
DRSG TEGADERM 4X4.75 (GAUZE/BANDAGES/DRESSINGS) ×4 IMPLANT
ELECT REM PT RETURN 15FT ADLT (MISCELLANEOUS) ×2 IMPLANT
EVACUATOR SILICONE 100CC (DRAIN) ×4 IMPLANT
GAUZE SPONGE 2X2 8PLY STRL LF (GAUZE/BANDAGES/DRESSINGS) ×2 IMPLANT
GLOVE SURG ENC MOIS LTX SZ6 (GLOVE) ×2 IMPLANT
GLOVE SURG ENC MOIS LTX SZ6.5 (GLOVE) ×4 IMPLANT
GOWN STRL REUS W/ TWL LRG LVL3 (GOWN DISPOSABLE) ×2 IMPLANT
GOWN STRL REUS W/TWL LRG LVL3 (GOWN DISPOSABLE) ×2
KIT BASIN OR (CUSTOM PROCEDURE TRAY) ×2 IMPLANT
KIT TURNOVER KIT A (KITS) ×2 IMPLANT
LEGGING LITHOTOMY PAIR STRL (DRAPES) ×2 IMPLANT
NEEDLE HYPO 22GX1.5 SAFETY (NEEDLE) ×2 IMPLANT
PACK GENERAL/GYN (CUSTOM PROCEDURE TRAY) ×2 IMPLANT
SCRUB CHG 4% DYNA-HEX 4OZ (MISCELLANEOUS) IMPLANT
SHEARS HARMONIC 9CM CVD (BLADE) ×2 IMPLANT
SOL PREP PROV IODINE SCRUB 4OZ (MISCELLANEOUS) ×2 IMPLANT
SPONGE DRAIN TRACH 4X4 STRL 2S (GAUZE/BANDAGES/DRESSINGS) ×2 IMPLANT
SPONGE GAUZE 2X2 STER 10/PKG (GAUZE/BANDAGES/DRESSINGS) ×2
SUT ETHILON 3 0 PS 1 (SUTURE) ×4 IMPLANT
SUT MNCRL AB 4-0 PS2 18 (SUTURE) ×4 IMPLANT
SUT NYLON 3 0 (SUTURE) IMPLANT
SUT SILK 2 0 (SUTURE) ×1
SUT SILK 2-0 18XBRD TIE 12 (SUTURE) ×1 IMPLANT
SUT SILK 3 0 (SUTURE)
SUT SILK 3-0 18XBRD TIE 12 (SUTURE) IMPLANT
SUT VIC AB 3-0 CT1 36 (SUTURE) IMPLANT
SUT VIC AB 3-0 SH 18 (SUTURE) IMPLANT
SUT VIC AB 3-0 SH 27 (SUTURE) ×2
SUT VIC AB 3-0 SH 27X BRD (SUTURE) ×2 IMPLANT
SYR BULB IRRIG 60ML STRL (SYRINGE) ×2 IMPLANT
SYR CONTROL 10ML LL (SYRINGE) ×2 IMPLANT
TOWEL OR 17X26 10 PK STRL BLUE (TOWEL DISPOSABLE) ×2 IMPLANT
TOWEL OR NON WOVEN STRL DISP B (DISPOSABLE) ×2 IMPLANT
TRAY FOLEY MTR SLVR 14FR STAT (SET/KITS/TRAYS/PACK) ×2 IMPLANT
TRAY FOLEY MTR SLVR 16FR STAT (SET/KITS/TRAYS/PACK) IMPLANT
YANKAUER SUCT BULB TIP 10FT TU (MISCELLANEOUS) ×2 IMPLANT

## 2020-07-11 NOTE — Anesthesia Procedure Notes (Signed)
Procedure Name: LMA Insertion Date/Time: 07/11/2020 12:54 PM Performed by: Claudia Desanctis, CRNA Pre-anesthesia Checklist: Emergency Drugs available, Patient identified, Suction available and Patient being monitored Patient Re-evaluated:Patient Re-evaluated prior to induction Oxygen Delivery Method: Circle system utilized Preoxygenation: Pre-oxygenation with 100% oxygen Induction Type: IV induction Ventilation: Mask ventilation without difficulty LMA: LMA inserted LMA Size: 4.0 Number of attempts: 1 Placement Confirmation: positive ETCO2 and breath sounds checked- equal and bilateral Tube secured with: Tape Dental Injury: Teeth and Oropharynx as per pre-operative assessment

## 2020-07-11 NOTE — Anesthesia Preprocedure Evaluation (Addendum)
Anesthesia Evaluation  Patient identified by MRN, date of birth, ID band Patient awake    Reviewed: Allergy & Precautions, NPO status , Patient's Chart, lab work & pertinent test results  Airway Mallampati: III  TM Distance: >3 FB Neck ROM: Full  Mouth opening: Limited Mouth Opening  Dental  (+) Missing, Dental Advisory Given,    Pulmonary former smoker,    Pulmonary exam normal breath sounds clear to auscultation       Cardiovascular hypertension, Pt. on medications Normal cardiovascular exam Rhythm:Regular Rate:Normal     Neuro/Psych PSYCHIATRIC DISORDERS Anxiety negative neurological ROS     GI/Hepatic negative GI ROS, Neg liver ROS,   Endo/Other  negative endocrine ROS  Renal/GU negative Renal ROS  negative genitourinary   Musculoskeletal  (+) Arthritis ,   Abdominal   Peds  Hematology negative hematology ROS (+)   Anesthesia Other Findings Vulvar Cancer   Reproductive/Obstetrics                            Anesthesia Physical Anesthesia Plan  ASA: II  Anesthesia Plan: General   Post-op Pain Management:    Induction: Intravenous  PONV Risk Score and Plan: 3 and Ondansetron, Dexamethasone and Midazolam  Airway Management Planned: LMA  Additional Equipment:   Intra-op Plan:   Post-operative Plan: Extubation in OR  Informed Consent: I have reviewed the patients History and Physical, chart, labs and discussed the procedure including the risks, benefits and alternatives for the proposed anesthesia with the patient or authorized representative who has indicated his/her understanding and acceptance.     Dental advisory given  Plan Discussed with: CRNA  Anesthesia Plan Comments:         Anesthesia Quick Evaluation

## 2020-07-11 NOTE — Anesthesia Postprocedure Evaluation (Signed)
Anesthesia Post Note  Patient: Audrea Muscat Espey  Procedure(s) Performed: INGUINAL LYMPHADENECTOMY (Bilateral )     Patient location during evaluation: PACU Anesthesia Type: General Level of consciousness: awake and alert, patient cooperative and oriented Pain management: pain level controlled Vital Signs Assessment: post-procedure vital signs reviewed and stable Respiratory status: spontaneous breathing, nonlabored ventilation and respiratory function stable Cardiovascular status: blood pressure returned to baseline and stable Postop Assessment: no apparent nausea or vomiting Anesthetic complications: no   No complications documented.  Last Vitals:  Vitals:   07/11/20 1530 07/11/20 1604  BP: (!) 149/78 (!) 167/99  Pulse: 85 85  Resp: 11 16  Temp:    SpO2: 95% 98%    Last Pain:  Vitals:   07/11/20 1530  TempSrc:   PainSc: Asleep                 Katrianna Friesenhahn,E. Fredricka Kohrs

## 2020-07-11 NOTE — Interval H&P Note (Signed)
History and Physical Interval Note:  07/11/2020 11:41 AM  Christina [name]  has presented today for surgery, with the diagnosis of VULVAR CANCER.  The various methods of treatment have been discussed with the patient and family. After consideration of risks, benefits and other options for treatment, the patient has consented to  Procedure(s): INGUINAL LYMPHADENECTOMY (Bilateral) as a surgical intervention.  The patient's history has been reviewed, patient examined, no change in status, stable for surgery.  I have reviewed the patient's chart and labs.  Questions were answered to the patient's satisfaction.     Thereasa Solo

## 2020-07-11 NOTE — Op Note (Signed)
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  Surgeons:  Donaciano Eva, MD Assistant: Lahoma Crocker  Anesthesia: General   Estimated blood loss: 4ml  IVF:  574ml   Urine output: 761 ml   Complications: None   Pathology: right and left inguinofemoral lymph nodes.  Operative findings: clinically suspicious medial left inguinal lymph node. 6cm perineal vulvar tumor.  Procedure: The patient was identified in the preoperative holding area. Informed consent was signed on the chart. Patient was seen history was reviewed and exam was performed.   The patient was then taken to the operating room and placed in the supine position with SCD hose on. General anesthesia was then induced without difficulty. She was then placed in the dorsolithotomy position. The perineum was prepped with Betadine. The vagina was prepped with betatine and groins were prepped with chloraprep. The patient was then draped after the prep was dried. A Foley catheter was inserted into the bladder under sterile conditions.  Timeout was performed the patient, procedure, antibiotic, allergy, and length of procedure.  At this point the right inguino-femoral lymphadenectomy was performed. The bony landmarks of the pubic tubercle and the ASIS were identified and the palpable location of the femoral artery. A 6cm incision was made 1 fingerbreadth below the inguinal ligament on the right anterior thigh/groin parallel to the inguinal ligament. The camper's fascia was scored and the inferior and superior skin flaps were created with elevation of skin hooks and use of the bovie for dissection. The inguino-femoral lymph nodes were circumscribed in the operative bed. Lymph node tissue was grasped and using bovie and harmonic dissection, the entire inguinofemoral  bed was resected from its attachments to the level of the cribiform facia overlying the femoral vessels. The boundaries of this dissection was the sartorius muscle laterally, the adductor longus tendon medially, the inguinal ligament superiorally, the cribiform fascia posteriorally. The great saphenous vein was identified and skeletonized during this dissection and spared. A stab wound was made with an 11 blade scalpel lateral and inferior to the incision and a 15 french JP drain was placed in the bed of the groin bed. It was secured with nylon. The camper's fascia was closed with running 3-0 vicryl overlying the drain. The skin was closed with running 4-0 monocryl and dermabond. The incisions and vulva were infiltrated with marcaine and exparel.  A similar procedure was performed on the left with the same landmarks and dissection field.   The groin incisions were dressed and covered.  The foley was removed.  All instrument, suture, laparotomy, Ray-Tec, and needle counts were correct x2. The patient tolerated the procedure well and was taken recovery room in stable condition.   This is Everitt Amber dictating an operative note on [place].   Thereasa Solo, MD

## 2020-07-11 NOTE — Transfer of Care (Signed)
Immediate Anesthesia Transfer of Care Note  Patient: Christina [name]  Procedure(s) Performed: INGUINAL LYMPHADENECTOMY (Bilateral )  Patient Location: PACU  Anesthesia Type:General  Level of Consciousness: drowsy  Airway & Oxygen Therapy: Patient Spontanous Breathing and Patient connected to face mask oxygen  Post-op Assessment: Report given to RN and Post -op Vital signs reviewed and stable  Post vital signs: Reviewed and stable  Last Vitals:  Vitals Value Taken Time  BP 156/76 07/11/20 1503  Temp    Pulse 81 07/11/20 1504  Resp 11 07/11/20 1504  SpO2 100 % 07/11/20 1504  Vitals shown include unvalidated device data.  Last Pain:  Vitals:   07/11/20 1031  TempSrc:   PainSc: 0-No pain      Patients Stated Pain Goal: 3 (75/88/32 [id number])  Complications: No complications documented.

## 2020-07-12 ENCOUNTER — Encounter (HOSPITAL_COMMUNITY): Payer: Self-pay | Admitting: Gynecologic Oncology

## 2020-07-12 DIAGNOSIS — C518 Malignant neoplasm of overlapping sites of vulva: Secondary | ICD-10-CM | POA: Diagnosis not present

## 2020-07-12 NOTE — Progress Notes (Signed)
Pt alert and oriented, tolerating diet. Dressings changed on JP drains. D/C instructions given and pt d/cd to home.

## 2020-07-12 NOTE — Discharge Instructions (Signed)
AFTER SURGERY INSTRUCTIONS  Return to work: 2-4 weeks if applicable  You have white honeycomb dressings over your incisions. These dressings can be removed 5 days after surgery and you do not need to reapply a new dressings. Once you remove the dressings, you will notice that you have the surgical glue (dermabond) on the incisions and this will peel off on its own. You can get these dressings wet in the shower the days after surgery prior to removal on the 5th day.  Record the drain output several times a day and/or when the drain is slightly less than halfway full. The JP bulb needs to be "charged" or compressed to be providing suction to remove the drainage.  Activity: 1. Be up and out of the bed during the day.  Take a nap if needed.  You may walk up steps but be careful and use the hand rail.  Stair climbing will tire you more than you think, you may need to stop part way and rest.   2. No lifting or straining for 4 weeks over 10 pounds. No pushing, pulling, straining for 4 weeks.  3. No driving for 1 week(s).  Do not drive if you are taking narcotic pain medicine and make sure that your reaction time has returned.   4. You can shower as soon as the next day after surgery. No tub baths or submerging your body in water until cleared by your surgeon. If you have the soap that was given to you by pre-surgical testing that was used before surgery, you do not need to use it afterwards because this can irritate your incisions.   5. You may experience a small amount of clear drainage from your incisions, which is normal.  If the drainage persists, increases, or changes color please call the office.  6. Do not use creams, lotions, or ointments such as neosporin on your incisions after surgery until advised by your surgeon because they can cause removal of the dermabond glue on your incisions.    7. Take Tylenol or ibuprofen first for pain and only use narcotic pain medication for severe pain  not relieved by the Tylenol or Ibuprofen.  Monitor your Tylenol intake to a max of 4,000 mg in a 24 hour period. You can alternate these medications after surgery.  Diet: 1. Low sodium Heart Healthy Diet is recommended but you are cleared to resume your normal (before surgery) diet after your procedure.  2. It is safe to use a laxative, such as Miralax or Colace, if you have difficulty moving your bowels. You have been prescribed Sennakot at bedtime every evening to keep bowel movements regular and to prevent constipation.    Wound Care: 1. Keep clean and dry.  Shower daily.  Reasons to call the Doctor:  Fever - Oral temperature greater than 100.4 degrees Fahrenheit  Foul-smelling vaginal discharge  Difficulty urinating  Nausea and vomiting  Increased pain at the site of the incision that is unrelieved with pain medicine.  Difficulty breathing with or without chest pain  New calf pain especially if only on one side  Sudden, continuing increased vaginal bleeding with or without clots.   Contacts: For questions or concerns you should contact:  Dr. Everitt Amber at [phone]  [name], NP at [phone]  After Hours: call [phone] and have the GYN Oncologist paged/contacted (after 5 pm or on the weekends).  Messages sent via mychart are for non-urgent matters and are not responded to after hours so for  urgent needs, please call the after hours number.   Surgical [facility] Care  Surgical drains are used to remove extra fluid that normally builds up in a surgical wound after surgery. A surgical drain helps to heal a surgical wound. Different kinds of surgical drains include:  Active drains. These drains use suction to pull drainage away from the surgical wound. Drainage flows through a tube to a container outside of the body. With these drains, you need to keep the bulb or the drainage container flat (compressed) at all times, except while you empty it.  Flattening the bulb or container creates suction.  Passive drains. These drains allow fluid to drain naturally, by gravity. Drainage flows through a tube to a bandage (dressing) or a container outside of the body. Passive drains do not need to be emptied. A drain is placed during surgery. Right after surgery, drainage is usually bright red and a little thicker than water. The drainage may gradually turn yellow or pink and become thinner. It is likely that your health care provider will remove the drain when the drainage stops or when the amount decreases to 1-2 Tbsp (15-30 mL) during a 24-hour period. Supplies needed:  Tape.  Germ-free cleaning solution (sterile saline).  Cotton swabs.  Split gauze drain sponge: 4 x 4 inches (10 x 10 cm).  Gauze square: 4 x 4 inches (10 x 10 cm). How to care for your surgical drain Care for your drain as told by your health care provider. This is important to help prevent infection. If your drain is placed at your back, or any other hard-to-reach area, ask another person to assist you in performing the following tasks: General care  Keep the skin around the drain dry and covered with a dressing at all times.  Check your drain area every day for signs of infection. Check for: ? Redness, swelling, or pain. ? Pus or a bad smell. ? Cloudy drainage. ? Tenderness or pressure at the drain exit site. Changing the dressing Follow instructions from your health care provider about how to change your dressing. Change your dressing at least once a day. Change it more often if needed to keep the dressing dry. Make sure you: 1. Gather your supplies. 2. Wash your hands with soap and water before you change your dressing. If soap and water are not available, use hand sanitizer. 3. Remove the old dressing. Avoid using scissors to do that. 4. Wash your hands with soap and water again after removing the old dressing. 5. Use sterile saline to clean your skin around the  drain. You may need to use a cotton swab to clean the skin. 6. Place the tube through the slit in a drain sponge. Place the drain sponge so that it covers your wound. 7. Place the gauze square or another drain sponge on top of the drain sponge that is on the wound. Make sure the tube is between those layers. 8. Tape the dressing to your skin. 9. Tape the drainage tube to your skin 1-2 inches (2.5-5 cm) below the place where the tube enters your body. Taping keeps the tube from pulling on any stitches (sutures) that you have. 10. Wash your hands with soap and water. 11. Write down the color of your drainage and how often you change your dressing. How to empty your active drain 1. Make sure that you have a measuring cup that you can empty your drainage into. 2. Wash your hands with soap and water.  If soap and water are not available, use hand sanitizer. 3. Loosen any pins or clips that hold the tube in place. 4. If your health care provider tells you to strip the tube to prevent clots and tube blockages: ? Hold the tube at the skin with one hand. Use your other hand to pinch the tubing with your thumb and first finger. ? Gently move your fingers down the tube while squeezing very lightly. This clears any drainage, clots, or tissue from the tube. ? You may need to do this several times each day to keep the tube clear. Do not pull on the tube. 5. Open the bulb cap or the drain plug. Do not touch the inside of the cap or the bottom of the plug. 6. Turn the device upside down and gently squeeze. 7. Empty all of the drainage into the measuring cup. 8. Compress the bulb or the container and replace the cap or the plug. To compress the bulb or the container, squeeze it firmly in the middle while you close the cap or plug the container. 9. Write down the amount of drainage that you have in each 24-hour period. If you have less than 2 Tbsp (30 mL) of drainage during 24 hours, contact your health care  provider. 10. Flush the drainage down the toilet. 11. Wash your hands with soap and water.    Contact a health care provider if:  You have redness, swelling, or pain around your drain area.  You have pus or a bad smell coming from your drain area.  You have a fever or chills.  The skin around your drain is warm to the touch.  The amount of drainage that you have is increasing instead of decreasing.  You have drainage that is cloudy.  There is a sudden stop or a sudden decrease in the amount of drainage that you have.  Your drain tube falls out.  Your active drain does not stay compressed after you empty it. Summary  Surgical drains are used to remove extra fluid that normally builds up in a surgical wound after surgery.  Different kinds of surgical drains include active drains and passive drains. Active drains use suction to pull drainage away from the surgical wound, and passive drains allow fluid to drain naturally.  It is important to care for your drain to prevent infection. If your drain is placed at your back, or any other hard-to-reach area, ask another person to assist you.  Contact your health care provider if you have redness, swelling, or pain around your drain area. This information is not intended to replace advice given to you by your health care provider. Make sure you discuss any questions you have with your health care provider. Document Revised: 03/11/2018 Document Reviewed: 03/11/2018 Elsevier Patient Education  2021 [employer]   Empty your surgical drain as told by your health care provider. Use this form to write down the amount of fluid that has collected in the drainage container. Bring this form with you to your follow-up visits. Surgical drain #1 location: ___________________ Date __________ Time __________ Amount __________ Date __________ Time __________ Amount __________ Date __________ Time __________ Amount  __________ Date __________ Time __________ Amount __________ Date __________ Time __________ Amount __________ Date __________ Time __________ Amount __________ Date __________ Time __________ Amount __________ Date __________ Time __________ Amount __________ Date __________ Time __________ Amount __________ Date __________ Time __________ Amount __________ Date __________ Time __________ Amount __________  Date __________ Time __________ Amount __________ Date __________ Time __________ Amount __________ Date __________ Time __________ Amount __________ Date __________ Time __________ Amount __________ Date __________ Time __________ Amount __________ Date __________ Time __________ Amount __________ Date __________ Time __________ Amount __________ Date __________ Time __________ Amount __________ Date __________ Time __________ Amount __________ Date __________ Time __________ Amount __________ Surgical drain #2 location: ___________________ Date __________ Time __________ Amount __________ Date __________ Time __________ Amount __________ Date __________ Time __________ Amount __________ Date __________ Time __________ Amount __________ Date __________ Time __________ Amount __________ Date __________ Time __________ Amount __________ Date __________ Time __________ Amount __________ Date __________ Time __________ Amount __________ Date __________ Time __________ Amount __________ Date __________ Time __________ Amount __________ Date __________ Time __________ Amount __________ Date __________ Time __________ Amount __________ Date __________ Time __________ Amount __________ Date __________ Time __________ Amount __________ Date __________ Time __________ Amount __________ Date __________ Time __________ Amount __________ Date __________ Time __________ Amount __________ Date __________ Time __________ Amount __________ Date __________ Time __________ Amount  __________ Date __________ Time __________ Amount __________ Date __________ Time __________ Amount __________ This information is not intended to replace advice given to you by your health care provider. Make sure you discuss any questions you have with your health care provider. Document Revised: 03/22/2019 Document Reviewed: 11/11/2016 Elsevier Patient Education  2021 [employer].

## 2020-07-12 NOTE — Discharge Summary (Signed)
Physician Discharge Summary  Patient ID: Christina [name] MRN: [id number] DOB/AGE: Sep 28, 1960 60 y.o.  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   Hospital Course: On 07/11/2020, the patient underwent the following: Procedure(s): INGUINAL LYMPHADENECTOMY. The postoperative course was uneventful.  She was discharged to home on postoperative day 1 tolerating a regular diet, voiding, pain controlled with tylenol, ambulating without difficulty, instructed on drain use.  Consults: None  Significant Diagnostic Studies: None  Treatments: surgery: see above  Discharge Exam: Blood pressure 123/72, pulse 67, temperature 98 F (36.7 C), temperature source Oral, resp. rate 18, height 5\' 1"  (1.549 m), weight 132 lb (59.9 kg), SpO2 100 %. General appearance: alert, cooperative and no distress Resp: clear to auscultation bilaterally Cardio: regular rate and rhythm, S1, S2 normal, no murmur, click, rub or gallop GI: soft, non-tender; bowel sounds normal; no masses,  no organomegaly Extremities: extremities normal, atraumatic, no cyanosis or edema Incision/Wound: Bilateral inguinal incisions with op site dressings in place with no drainage noted underneath. Dressings around JP drains intact with light dry staining around the left tubing. Drains emptied with serosanguinous drainage and stripped. Patient performed return demonstration on drain stripping.  Disposition: Discharge disposition: 01-Home or Self Care       Discharge Instructions    Call MD for:  difficulty breathing, headache or visual disturbances   Complete by: As directed    Call MD for:  extreme fatigue   Complete by: As directed    Call  MD for:  hives   Complete by: As directed    Call MD for:  persistant dizziness or light-headedness   Complete by: As directed    Call MD for:  persistant nausea and vomiting   Complete by: As directed    Call MD for:  redness, tenderness, or signs of infection (pain, swelling, redness, odor or green/yellow discharge around incision site)   Complete by: As directed    Call MD for:  severe uncontrolled pain   Complete by: As directed    Call MD for:  temperature >100.4   Complete by: As directed    Diet - low sodium heart healthy   Complete by: As directed    Discharge wound care:   Complete by: As directed    You will have white honeycomb dressings over your groin incisions. These dressings can be removed 5 days after surgery and you do not need to reapply a new dressing. Once you remove the dressing, you will notice that you have the surgical glue (dermabond) on the incision and this will peel off on its own. You can get this dressing wet in the shower the days after surgery prior to removal on the 5th day.  Change the dressing around the JP drain at least once a day. If it becomes wet or soiled, change and apply a new dry dressing.   Driving Restrictions   Complete by: As directed    No driving for around 1 week.  Do not take narcotics and drive. You need to make sure your reaction time has returned and you can brake safely.   Increase activity slowly   Complete by: As directed    Lifting restrictions   Complete by:  As directed    No lifting greater than 10 lbs.   Sexual Activity Restrictions   Complete by: As directed    No sexual activity, nothing in the vagina, for 2-4 weeks.     Allergies as of 07/12/2020   No Known Allergies     Medication List    TAKE these medications   acetaminophen 325 MG tablet Commonly known as: TYLENOL Take 650 mg by mouth every 6 (six) hours as needed for moderate pain.   cetirizine 10 MG tablet Commonly known as: ZYRTEC TAKE 1 TO 2 TABLETS  BY MOUTH DAILY AS NEEDED What changed:   how much to take  how to take this  when to take this  additional instructions   diphenhydramine-acetaminophen 25-500 MG Tabs tablet Commonly known as: TYLENOL PM Take 2 tablets by mouth at bedtime.   ibuprofen 600 MG tablet Commonly known as: ADVIL Take 1 tablet (600 mg total) by mouth every 6 (six) hours as needed for moderate pain. For AFTER surgery only   ipratropium 0.06 % nasal spray Commonly known as: ATROVENT USE 2 SPRAYS IN EACH NOSTRIL THREE TIMES DAILY   levocetirizine 5 MG tablet Commonly known as: XYZAL Take 1 tablet (5 mg total) by mouth every evening.   lidocaine 2 % solution Commonly known as: XYLOCAINE Apply small amount over sore lesion up to every 4 hours as needed for pain   lidocaine 2 % solution Commonly known as: XYLOCAINE APPLY A SMALL AMOUNT OVER SORE LESION UP TO EVERY 4 HOURS AS NEEDED FOR PAIN   LORazepam 0.5 MG tablet Commonly known as: ATIVAN Take 1 tablet (0.5 mg total) by mouth every 8 (eight) hours. As needed for nausea or anxiety   losartan 50 MG tablet Commonly known as: COZAAR TAKE 1 TABLET(50 MG) BY MOUTH DAILY What changed: See the new instructions.   oxyCODONE 5 MG immediate release tablet Commonly known as: Oxy IR/ROXICODONE Take 1 tablet (5 mg total) by mouth every 4 (four) hours as needed for severe pain. For AFTER surgery, do not take and drive   oxyCODONE-acetaminophen 5-325 MG tablet Commonly known as: PERCOCET/ROXICET 1 po at bedtime as needed   PROBIOTIC PO Take 1 tablet by mouth daily.   senna-docusate 8.6-50 MG tablet Commonly known as: Senokot-S Take 2 tablets by mouth at bedtime. For AFTER surgery, do not take if having diarrhea   Vitamin D 50 MCG (2000 UT) tablet Take 2,000 Units by mouth daily.            Discharge Care Instructions  (From admission, onward)         Start     Ordered   07/12/20 0000  Discharge wound care:       Comments: You will have  white honeycomb dressings over your groin incisions. These dressings can be removed 5 days after surgery and you do not need to reapply a new dressing. Once you remove the dressing, you will notice that you have the surgical glue (dermabond) on the incision and this will peel off on its own. You can get this dressing wet in the shower the days after surgery prior to removal on the 5th day.  Change the dressing around the JP drain at least once a day. If it becomes wet or soiled, change and apply a new dry dressing.   07/12/20 0933          Follow-up Information    [name] D, NP Follow up on 07/18/2020.  Specialty: Gynecologic Oncology Why: at 1:30pm at the [facility] for drain check. Please bring your recording sheet to this visit. Contact information: [place] [address] [phone]        Everitt Amber, MD Follow up on 07/24/2020.   Specialty: Gynecologic Oncology Why: at 2pm at the [facility] for post-op check. Please bring your recording sheet to this visit. Contact information: [place] [address] [phone]               Greater than thirty minutes were spend for face to face discharge instructions and discharge orders/summary in EPIC.   Signed: Dorothyann Gibbs 07/12/2020, 9:48 AM

## 2020-07-13 ENCOUNTER — Encounter: Payer: 59 | Admitting: Rehabilitative and Restorative Service Providers"

## 2020-07-13 ENCOUNTER — Telehealth: Payer: Self-pay

## 2020-07-13 ENCOUNTER — Encounter: Payer: Self-pay | Admitting: Gynecologic Oncology

## 2020-07-13 NOTE — Telephone Encounter (Signed)
Spoke with Christina [name], she is doing well since her surgery on 07/11/20. She states her pain is well controlled with tylenol. She is eating, drinking, urinating well and has had a BM. She denies fever or chills. Patient is recording drain output and is aware she needs to change JP dressings daily. She verbalized understanding of her appointment on 07/18/20. Instructed to call with any questions or concerns.

## 2020-07-14 ENCOUNTER — Other Ambulatory Visit: Payer: Self-pay | Admitting: Gynecologic Oncology

## 2020-07-14 ENCOUNTER — Encounter: Payer: Self-pay | Admitting: Gynecologic Oncology

## 2020-07-14 ENCOUNTER — Telehealth: Payer: Self-pay | Admitting: Oncology

## 2020-07-14 ENCOUNTER — Telehealth: Payer: Self-pay

## 2020-07-14 DIAGNOSIS — C519 Malignant neoplasm of vulva, unspecified: Secondary | ICD-10-CM

## 2020-07-14 NOTE — Telephone Encounter (Signed)
Received phone call from [facility] regarding her drains. Patient states the right one is draining ok but the drain on her left side is not putting out as much and is tender around the entry site of the drain. Patient denies any redness, fever or chills. Per [name], NP continue to strip drains and monitor drain output. Instructed to call with any questions or concerns. Patient verbalized understanding and has both the clinic phone number and the after hours phone number. [place] would also like clarification regarding her pathology results that posted to her MyChart. Dr. Denman George has been notified.

## 2020-07-14 NOTE — Telephone Encounter (Signed)
Christina [name] to see how she is doing.  She is trying to wrap her head around everything now and is anxious to start radiation.  Advised her that we will present her case at the next [place] and that I am available for any questions or needs.

## 2020-07-18 ENCOUNTER — Inpatient Hospital Stay (HOSPITAL_BASED_OUTPATIENT_CLINIC_OR_DEPARTMENT_OTHER): Payer: 59 | Admitting: Gynecologic Oncology

## 2020-07-18 ENCOUNTER — Ambulatory Visit (INDEPENDENT_AMBULATORY_CARE_PROVIDER_SITE_OTHER): Payer: 59 | Admitting: Allergy & Immunology

## 2020-07-18 ENCOUNTER — Encounter: Payer: Self-pay | Admitting: Allergy & Immunology

## 2020-07-18 ENCOUNTER — Telehealth: Payer: Self-pay

## 2020-07-18 ENCOUNTER — Other Ambulatory Visit: Payer: Self-pay

## 2020-07-18 VITALS — BP 150/80 | HR 89 | Temp 97.9°F | Resp 16 | Ht 61.0 in | Wt 134.2 lb

## 2020-07-18 VITALS — BP 157/91 | HR 85 | Temp 97.4°F | Resp 20 | Wt 132.0 lb

## 2020-07-18 DIAGNOSIS — T8149XA Infection following a procedure, other surgical site, initial encounter: Secondary | ICD-10-CM | POA: Insufficient documentation

## 2020-07-18 DIAGNOSIS — J0100 Acute maxillary sinusitis, unspecified: Secondary | ICD-10-CM | POA: Diagnosis not present

## 2020-07-18 DIAGNOSIS — C519 Malignant neoplasm of vulva, unspecified: Secondary | ICD-10-CM

## 2020-07-18 DIAGNOSIS — J302 Other seasonal allergic rhinitis: Secondary | ICD-10-CM

## 2020-07-18 DIAGNOSIS — J3089 Other allergic rhinitis: Secondary | ICD-10-CM | POA: Diagnosis not present

## 2020-07-18 MED ORDER — CEPHALEXIN 500 MG PO CAPS: 500.0000 mg | ORAL_CAPSULE | Freq: Four times a day (QID) | ORAL | 0 refills | Status: DC

## 2020-07-18 NOTE — Progress Notes (Signed)
FOLLOW UP  Date of Service/Encounter:  07/18/20   Assessment:   Seasonal and perennial allergic rhinitis(weeds, grasses, indoor molds, dust mites, cat, dog and cockroach)- withoverlying acute sinusitis, likely viral mediated   Recent diagnosis of vulvar squamous cell carcinoma  Cellulitis - started cephalexin today   [name] presents for sick visit.  With the time course, this could just be worsening of her allergies or a viral sinusitis.  She is going to be starting Keflex, which could provide some relief of her symptoms.  It does not have an ideal spectrum to treat bacterial sinusitis, but it could certainly work.  We will see how she is doing in a few days and we can change antibiotics if needed with the knowledge that we need to cover causes of cellulitis in her antibiotic regimen.   Plan/Recommendations:   1. Seasonal and perennial allergic rhinitis - with overlying sinusitis (likely allergic) - I think the Keflex (cephalexin) should cover what is going on with your sinuses.  - I would focus on azelastine twice daily - I think the azelastine and the Keflex should kick it. - Call us if this is not working.   2. Chronic urticaria  -Continue with the antihistamines as above.  3. Return in about 6 months (around 01/17/2021).    Subjective:   Christina [name] is a 60 y.o. female presenting today for follow up of  Chief Complaint  Patient presents with  . Allergic Rhinitis     Christina [name] has a history of the following: Patient Active Problem List   Diagnosis Date Noted  . Cellulitis, wound, post-operative 07/18/2020  . Vulvar cancer ([place]) 07/11/2020  . Vulva cancer ([place]) 07/11/2020  . 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 Arthritis of right knee   . 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 Degenerative arthritis of knee, bilateral 12/31/2019  . Patellofemoral arthritis of left knee 03/10/2019  . Acute medial meniscus tear of left knee  11/19/2018  . Sprain of medial collateral ligament of left knee 09/01/2018  . Neck strain, initial encounter 08/28/2018  . Degenerative disc disease, cervical 08/28/2018  . Left lumbar radiculopathy 03/05/2018  . Right lateral epicondylitis 10/30/2017  . SI (sacroiliac) joint dysfunction 10/30/2017  . Nonallopathic lesion of sacral region 10/30/2017  . Nonallopathic lesion of lumbar region 10/30/2017  . Nonallopathic lesion of thoracic region 10/30/2017  . History of recurrent UTIs 04/11/2017  . 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 Allergic rhinitis 10/29/2016    History obtained from: chart review and patient.  Christina [name] is a 60 y.o. female presenting for a follow up visit.  She was last seen in October 2021.  At that time, we treated her for acute sinusitis with an azithromycin course.  We continued Claritin 10 mg in the morning and Xyzal 5 mg at night in combination with Astelin 2 sprays per nostril up to twice daily with ipratropium and Sudafed added with worsening symptoms.  Since last visit, she unfortunately was diagnosed with vulvar squamous cell carcinoma.  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  She had the lymph nodes removed and she will be starting daily radiation treatments for 7 to 8 weeks.  This is a recent diagnosis in the last month but she is pretty upset about that.  She reports that coming out of the hospital, she has developed rhinorrhea. She has had a lot of elevated temps of 100.2. COVID x3 is negative. The low grade fever broke today.  She started the azelastine over the weekend. She was in the hospital last Tuesday and then symptoms started on Wednesday.  She is worried that it has a bit of an allergic reaction to something she got in the hospital, but it should be noted that she had no throat swelling, hives, itching, or other systemic signs.  She was recently started on Keflex 4 times a day today for a right thigh cellulitis. She has not started this yet as she just  left the appointment. She has started her Astelin with improvement in her symptoms. Her low grade fever has resolved as of this morning.   Otherwise, there have been no changes to her past medical history, surgical history, family history, or social history.    Review of Systems  Constitutional: Negative.  Negative for chills, fever, malaise/fatigue and weight loss.  HENT: Positive for congestion. Negative for ear discharge, ear pain and sinus pain.        Positive for rhinorrhea.  Eyes: Negative for pain, discharge and redness.  Respiratory: Negative for cough, sputum production, shortness of breath and wheezing.   Cardiovascular: Negative.  Negative for chest pain and palpitations.  Gastrointestinal: Negative for abdominal pain, constipation, diarrhea, heartburn, nausea and vomiting.  Skin: Negative.  Negative for itching and rash.  Neurological: Positive for headaches. Negative for dizziness.  Endo/Heme/Allergies: Negative for environmental allergies. Does not bruise/bleed easily.       Objective:   Blood pressure (!) 150/80, pulse 89, temperature 97.9 F (36.6 C), temperature source Temporal, resp. rate 16, height 5\' 1"  (1.549 m), weight 134 lb 3.2 oz (60.9 kg), SpO2 98 %. Body mass index is 25.36 kg/m.   Physical Exam:  Physical Exam Constitutional:      Appearance: She is well-developed.  HENT:     Head: Normocephalic and atraumatic.     Right Ear: Tympanic membrane, ear canal and external ear normal.     Left Ear: Tympanic membrane, ear canal and external ear normal.     Nose: No nasal deformity, septal deviation, mucosal edema or rhinorrhea.     Right Sinus: No maxillary sinus tenderness or frontal sinus tenderness.     Left Sinus: No maxillary sinus tenderness or frontal sinus tenderness.     Comments: Strings of clear mucous between the turbinates. No nasal polyps. No sinus pressure noted.     Mouth/Throat:     Mouth: Mucous membranes are not pale and not dry.      Pharynx: Uvula midline.  Eyes:     General:        Right eye: No discharge.        Left eye: No discharge.     Conjunctiva/sclera: Conjunctivae normal.     Right eye: Right conjunctiva is not injected. No chemosis.    Left eye: Left conjunctiva is not injected. No chemosis.    Pupils: Pupils are equal, round, and reactive to light.  Cardiovascular:     Rate and Rhythm: Normal rate and regular rhythm.     Heart sounds: Normal heart sounds.  Pulmonary:     Effort: Pulmonary effort is normal. No tachypnea, accessory muscle usage or respiratory distress.     Breath sounds: Normal breath sounds. No wheezing, rhonchi or rales.     Comments: Moving air well in all lung fields.  No increased work of breathing. Chest:     Chest wall: No tenderness.  Lymphadenopathy:     Cervical: No cervical adenopathy.  Skin:    General:  Skin is warm.     Capillary Refill: Capillary refill takes less than 2 seconds.     Coloration: Skin is not pale.     Findings: No abrasion, erythema, petechiae or rash. Rash is not papular, urticarial or vesicular.     Comments: No eczematous or urticarial lesions noted.  Neurological:     Mental Status: She is alert.      Diagnostic studies: none        Salvatore Marvel, MD  All[facility] [place] [facility] [place]

## 2020-07-18 NOTE — Progress Notes (Signed)
GYN Oncology Post-op Follow Up  Christina [name] is a 60 year old female initially seen in consultation on Jun 22, 2020 at the request of Dr. Dellis Filbert for newly diagnosed vulvar cancer. The vulvar tumor measured 6 x 4 cm and she was staged as a clinical stage IB. CT imaging was ordered pre-operatively on 06/30/2020 resulting no gross evidence of metastatic disease in the chest, abdomen, and pelvis. On 07/11/2020, she underwent bilateral inguinofemoral lymphadenectomy with Dr. Everitt Amber. Final pathology revealed metastatic disease involving one of the three lymph nodes from the right inguinofemoral node resection and 2 out of 4 from the left inguinofemoral node resection. Her post-operative course was uneventful and she was discharged home on POD1 with bilateral JP drains in place.  She has been monitoring JP drain output at home. Over the weekend, she reported having an elevated temperature as high as 100.2. She was having sinus symptoms as well with nasal drainage (3 covid tests were negative). She has started developing moderate to severe right thigh discomfort with the pain interfering with ambulation. No lower extremity edema reported. She reports feeling fatigued and drained.  07/15/2020: 190 cc output from left drain    235 cc output from the right drain  07/16/2020: 315 cc output from left drain    445 cc output from the right drain 07/17/2020: 135 cc output from left drain    190 cc output from the right drain      Exam: Alert, oriented, in mild distress related to discomfort with the right leg. Op site dressings removed from the bilateral groin incisions. Incisions intact with dermabond present. Distal to the right groin incision, erythema noted with increased warmth and tenderness noted. The erythema was marked. See media for photo. Mild erythema noted around the right JP insertion site as well with no drainage at the site. The left groin incision without signs of infection. JP site assessed and dressing  changed. Right thigh is mildly edematous and larger than the left.   Assessment/Plan: Exam findings today concerning for post-operative right thigh cellulitis. Plan to begin keflex four times daily for minimum of 10 days. Reportable signs and symptoms reviewed. Dr. Denman George updated on situation and agrees with plan. Patient is to call for worsening symptoms. She is advised to continue monitoring her drain output and to follow up as planned or sooner if needed. Our office will reach out to her tomorrow and check in.

## 2020-07-18 NOTE — Telephone Encounter (Signed)
Spoke with Christina [name] this morning regarding her upcoming appointments. Discussed the symptoms of her sinus infection to ensure no COVID symptoms prior to her drain check appointment. Patient states she had a fever of 100.2 2 days ago but has taken 3 COVID tests, all negative. Per [name], NP we will still check her drain today.

## 2020-07-18 NOTE — Patient Instructions (Addendum)
Plan to begin taking Keflex four times a day for the next 10 days to treat the right thigh cellulitis. If the redness does not improve, spreads past the markings on the thigh, fever develops, you continue to feel poorly, please call the office at [phone] so you can be seen for further evaluation. Plan to follow up as planned or sooner if needed.  Continue monitoring your drain output and changing the dressings around the drains once daily.  Cephalexin Tablets or Capsules What is this medicine? CEPHALEXIN (sef a LEX in) is a cephalosporin antibiotic. It treats some infections caused by bacteria. It will not work for colds, the flu, or other viruses. This medicine may be used for other purposes; ask your health care provider or pharmacist if you have questions. COMMON BRAND NAME(S): Biocef, Daxbia, Keflex, Keftab What should I tell my health care provider before I take this medicine? They need to know if you have any of these conditions:  bleeding disorder  kidney disease  liver disease  seizures  stomach or intestine problems like colitis  an unusual or allergic reaction to cephalexin, other penicillin or cephalosporin antibiotics, other medicines, foods, dyes, or preservatives  pregnant or trying to get pregnant  breast-feeding How should I use this medicine? Take this drug by mouth. Take it as directed on the prescription label at the same time every day. You can take it with or without food. If it upsets your stomach, take it with food. Take all of this drug unless your health care provider tells you to stop it early. Keep taking it even if you think you are better. Talk to your health care provider about the use of this drug in children. While it may be prescribed for selected conditions, precautions do apply. Overdosage: If you think you have taken too much of this medicine contact a poison control center or emergency room at once. NOTE: This medicine is only for you. Do not  share this medicine with others. What if I miss a dose? If you miss a dose, take it as soon as you can. If it is almost time for your next dose, take only that dose. Do not take double or extra doses. What may interact with this medicine?  probenecid  some other antibiotics This list may not describe all possible interactions. Give your health care provider a list of all the medicines, herbs, non-prescription drugs, or dietary supplements you use. Also tell them if you smoke, drink alcohol, or use illegal drugs. Some items may interact with your medicine. What should I watch for while using this medicine? Tell your health care provider if your symptoms do not start to get better or if they get worse. Do not treat diarrhea with over the counter products. Contact your health care provider if you have diarrhea that lasts more than 2 days or if it is severe and watery. This medicine may cause serious skin reactions. They can happen weeks to months after starting the medicine. Contact your health care provider right away if you notice fevers or flu-like symptoms with a rash. The rash may be red or purple and then turn into blisters or peeling of the skin. Or, you might notice a red rash with swelling of the face, lips or lymph nodes in your neck or under your arms. If you have diabetes, you may get a false-positive result for sugar in your urine. Check with your health care provider. What side effects may I notice from receiving this  medicine? Side effects that you should report to your doctor or health care provider as soon as possible:  allergic reactions (skin rash, itching or hives; swelling of the face, lips, or tongue)  bloody or watery diarrhea  fever  kidney injury (trouble passing urine or change in the amount of urine)  low red blood cell counts (trouble breathing; feeling faint; lightheaded, falls; unusually weak or tired)  redness, blistering, peeling, or loosening of the skin,  including inside the mouth  unusual bruising or bleeding Side effects that usually do not require medical attention (report to your doctor or health care provider if they continue or are bothersome):  headache  dizziness  nausea, vomiting  unusual vaginal discharge, itching, or odor  upset stomach This list may not describe all possible side effects. Call your doctor for medical advice about side effects. You may report side effects to FDA at 1-800-FDA-1088. Where should I keep my medicine? Keep out of the reach of children and pets. Store at room temperature between 20 and 25 degrees C (68 and 77 degrees F). Throw away any unused drug after the expiration date. NOTE: This sheet is a summary. It may not cover all possible information. If you have questions about this medicine, talk to your doctor, pharmacist, or health care provider.  2021 Elsevier/Gold Standard (2018-12-10 15:26:31)

## 2020-07-18 NOTE — Patient Instructions (Addendum)
1. Seasonal and perennial allergic rhinitis - with overlying sinusitis (likely allergic) - I think the Keflex (cephalexin) should cover what is going on with your sinuses.  - I would focus on azelastine twice daily - I think the azelastine and the Keflex should kick it. - Call us if this is not working.   2. Chronic urticaria  -Continue with the antihistamines as above.  3. Return in about 6 months (around 01/17/2021).    Please inform us of any Emergency Department visits, hospitalizations, or changes in symptoms. Call us before going to the ED for breathing or allergy symptoms since we might be able to fit you in for a sick visit. Feel free to contact us anytime with any questions, problems, or concerns.  It was a pleasure to see you again today!  Websites that have reliable patient information: 1. American Academy of Asthma, Allergy, and Immunology: www.aaaai.org 2. Food Allergy Research and Education (FARE): foodallergy.org 3. Mothers of Asthmatics: http://www.asthmacommunitynetwork.org 4. American College of Allergy, Asthma, and Immunology: www.acaai.org   COVID-19 Vaccine Information can be found at: ShippingScam.co.uk For questions related to vaccine distribution or appointments, please email [email or web address] or call [phone].   We realize that you might be concerned about having an allergic reaction to the COVID19 vaccines. To help with that concern, WE ARE OFFERING THE COVID19 VACCINES IN OUR OFFICE! Ask the front desk for dates!     "Like" [place] on Facebook and Instagram for our latest updates!      A healthy democracy works best when [employer] participate! Make sure you are registered to vote! If you have moved or changed any of your contact information, you will need to get this updated before voting!  In some cases, you MAY be able to register to vote online:  [email or web address]

## 2020-07-19 ENCOUNTER — Telehealth: Payer: Self-pay

## 2020-07-19 ENCOUNTER — Encounter: Payer: Self-pay | Admitting: Oncology

## 2020-07-19 LAB — SURGICAL PATHOLOGY

## 2020-07-19 NOTE — Progress Notes (Signed)
Requested clarification on sizes of the cancer on accession  [phone] with [facility] via email.

## 2020-07-19 NOTE — Telephone Encounter (Signed)
Spoke with Christina [name] this morning, she states she is still having pain in her right leg when ambulating, her right leg is red and hot. Patient states she started taking antibiotics yesterday afternoon at 4 pm. Denies fever or chills. Redness is not extending beyond the line where she was marked in the office.    Instructed her to call the office if she develops fever, the redness extends beyond the marking on her thigh, redness not improving or her pain becomes worse. Patient has the office number [phone] and after hours number [phone] to call if she has any questions or concerns. Patient verbalized understanding.

## 2020-07-20 ENCOUNTER — Telehealth: Payer: Self-pay | Admitting: Oncology

## 2020-07-20 ENCOUNTER — Encounter: Payer: 59 | Admitting: Rehabilitative and Restorative Service Providers"

## 2020-07-20 NOTE — Telephone Encounter (Signed)
Called Christina [name] back and advised [place] Denman [place] not worried about the drainage.  Christina [name] had further questions on how to get the drainage to stop and was upset about the issues she has been having over the past week.  Advised I will try to have Christina John, NP or Dr. Denman George call her when they are out of surgery.

## 2020-07-20 NOTE — Telephone Encounter (Signed)
Christina [name] called and said her left tube is leaking yellow drainage on to her clothes.  She said she thinks it is coming from the insertion site.  The tube is also draining and she has had 10-20 cc so far today.  Asked if the site is red and she said she can't tell because she has a dressing on it.  She denies having a fever and is taking Keflex. She is anxious for an answer because [place].

## 2020-07-21 ENCOUNTER — Encounter: Payer: Self-pay | Admitting: Gynecologic Oncology

## 2020-07-24 ENCOUNTER — Other Ambulatory Visit: Payer: Self-pay | Admitting: Oncology

## 2020-07-24 ENCOUNTER — Other Ambulatory Visit: Payer: Self-pay

## 2020-07-24 ENCOUNTER — Encounter: Payer: Self-pay | Admitting: Oncology

## 2020-07-24 ENCOUNTER — Inpatient Hospital Stay: Payer: 59 | Attending: Gynecologic Oncology | Admitting: Gynecologic Oncology

## 2020-07-24 VITALS — BP 155/88 | HR 78 | Temp 97.2°F | Resp 16 | Ht 61.0 in | Wt 133.6 lb

## 2020-07-24 DIAGNOSIS — C774 Secondary and unspecified malignant neoplasm of inguinal and lower limb lymph nodes: Secondary | ICD-10-CM | POA: Diagnosis not present

## 2020-07-24 DIAGNOSIS — Z7189 Other specified counseling: Secondary | ICD-10-CM

## 2020-07-24 DIAGNOSIS — Z9889 Other specified postprocedural states: Secondary | ICD-10-CM | POA: Diagnosis not present

## 2020-07-24 DIAGNOSIS — L03314 Cellulitis of groin: Secondary | ICD-10-CM | POA: Insufficient documentation

## 2020-07-24 DIAGNOSIS — C519 Malignant neoplasm of vulva, unspecified: Secondary | ICD-10-CM | POA: Diagnosis not present

## 2020-07-24 DIAGNOSIS — T8149XA Infection following a procedure, other surgical site, initial encounter: Secondary | ICD-10-CM

## 2020-07-24 MED ORDER — CEPHALEXIN 500 MG PO CAPS: 500.0000 mg | ORAL_CAPSULE | Freq: Four times a day (QID) | ORAL | 0 refills | Status: DC

## 2020-07-24 NOTE — Progress Notes (Signed)
Gynecologic Oncology Multi-Disciplinary Disposition Conference Note  Date of the Conference: 07/24/2020  Patient Name: Christina [name]  Referring Provider: Dr. Dellis Filbert Primary GYN Oncologist: Dr. 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 Disposition is to radiation to the groins and primary site. Also consider PET imaging.   This Multidisciplinary conference took place involving physicians from [place], [place], Radiation Oncology, Pathology, Radiology along with the Gynecologic Oncology Nurse Practitioner and RN.  Comprehensive assessment of the patient's malignancy, staging, need for surgery, chemotherapy, radiation therapy, and need for further testing were reviewed. Supportive measures, both inpatient and following discharge were also discussed. The recommended plan of care is documented. Greater than 35 minutes were spent correlating and coordinating this patient's care.

## 2020-07-24 NOTE — Progress Notes (Signed)
Consult Note: Gyn-Onc  Consult was requested by Dr. Dellis Filbert for the evaluation of Christina [name] 60 y.o. female  CC:  Chief Complaint  Patient presents with  . Squamous cell carcinoma of vulva [facility])    Assessment/Plan:  [name]  is a 60 y.o.  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  Recommendation is for PET for restaging to evaluate for areas that require "boost" with radiation. Recommendation is for definitive radiation to vulva and groins and pelvis (+/- boost if macroscopic disease seen on PET).  Recommendation is for post-treatment biopsies of vulva at 6-12 weeks to ensure complete pathologic response. If there remains microscopic (or macroscopic) disease, she would be a candidate at that time for resection of the residual lesion.   She has postop cellulitis of the right groin. She has been treated with oral antibiotics with excellent response. Recommend 1 more week (total of 2 week course). She is cleared to start Rt.  Recommend removal of drains in 1 week (ideally with drain output <81mL per day).    HPI: [name] is a 60 year old P4 who was seen in consultation at the request of Dr Dellis Filbert for evaluation of a vulvar cancer.  The patient began experiencing vulvar burning after her knee replacement in March 2022.  She mentioned this to her primary care physician who looked at it and was unclear about the diagnosis but recommended consultation with a gynecologist.  The patient did not have an established gynecologist and therefore made a new patient consultation with Dr. Dellis Filbert.  Dr. Dellis Filbert saw the patient on 06/16/2020 which revealed a squamous cell carcinoma of the vulva.  The tumor was well differentiated and keratinizing.  Her primary vulvar cancer was not felt to be amenable to primary resection as it was a 6cm lesion replacing the perineal body and adherent with the anal  sphincter.  A staging CT scan on 06/30/20 showed no gross metastatic disease (no lymphadenopathy).  She was taken to the operating room on 07/11/20 for bilateral inguinofemoral lymphadenectomy to surgically evaluate the groins prior to proceeding with radiation to the vulva.  Final pathology from bilateral inguinofemoral lymphadenectomy revealed positive lymph nodes for metastatic squamous cell carcinoma in 1 of 3 right inguinal lymph nodes and 2/4 left inguinal lymph nodes.  The size of the metastases were 5.5 mm and 11 mm.   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 Recommended therapy was primary radiation to the vulva with radiation to the groins and pelvis.  Plan would be for repeat biopsy of the vulva 6-12 weeks after completing radiation to confirm complete pathologic response. If there was a complete pathologic response, no further therapy would be necessary, however if there was gross or macroscopic residual disease, she would require a resection.   Interval Hx:  She developed postoperative cellulitis in the right groin.  This was treated with Keflex.  After 1 week of treatment she had substantially improved symptoms of erythema and pain.  She continues to have patches of numbness and pain in the inner thighs.  The patient reported and documented drain outputs of approximately 80 to 100 mL/day in the preceding 2 to 3 days.  Current Meds:  Outpatient Encounter Medications as of 07/24/2020  Medication Sig  . cetirizine (ZYRTEC) 10 MG tablet TAKE 1 TO 2 TABLETS BY MOUTH DAILY AS NEEDED (Patient taking differently: Take  10 mg by mouth daily.)  . Cholecalciferol (VITAMIN D) 50 MCG (2000 UT) tablet Take 2,000 Units by mouth daily.  Marland Kitchen ibuprofen (ADVIL) 600 MG tablet TAKE 1 TABLET(600 MG) BY MOUTH EVERY 6 HOURS AFTER SURGERY AS NEEDED FOR MODERATE PAIN  . ipratropium (ATROVENT) 0.06 % nasal spray USE 2 SPRAYS IN EACH NOSTRIL THREE TIMES DAILY  . levocetirizine (XYZAL) 5  MG tablet Take 1 tablet (5 mg total) by mouth every evening.  Marland Kitchen LORazepam (ATIVAN) 0.5 MG tablet Take 1 tablet (0.5 mg total) by mouth every 8 (eight) hours. As needed for nausea or anxiety  . losartan (COZAAR) 50 MG tablet TAKE 1 TABLET(50 MG) BY MOUTH DAILY  . oxyCODONE (OXY IR/ROXICODONE) 5 MG immediate release tablet Take 1 tablet (5 mg total) by mouth every 4 (four) hours as needed for severe pain. For AFTER surgery, do not take and drive  . Probiotic Product (PROBIOTIC PO) Take 1 tablet by mouth daily.  . [DISCONTINUED] cephALEXin (KEFLEX) 500 MG capsule Take 1 capsule (500 mg total) by mouth 4 (four) times daily.  Marland Kitchen acetaminophen (TYLENOL) 325 MG tablet Take 650 mg by mouth every 6 (six) hours as needed for moderate pain. (Patient not taking: No sig reported)  . cephALEXin (KEFLEX) 500 MG capsule Take 1 capsule (500 mg total) by mouth 4 (four) times daily.  . diphenhydramine-acetaminophen (TYLENOL PM) 25-500 MG TABS tablet Take 2 tablets by mouth at bedtime. (Patient not taking: No sig reported)  . lidocaine (XYLOCAINE) 2 % solution Apply small amount over sore lesion up to every 4 hours as needed for pain (Patient not taking: No sig reported)  . oxyCODONE-acetaminophen (PERCOCET/ROXICET) 5-325 MG tablet 1 po at bedtime as needed (Patient not taking: No sig reported)  . senna-docusate (SENOKOT-S) 8.6-50 MG tablet Take 2 tablets by mouth at bedtime. For AFTER surgery, do not take if having diarrhea (Patient not taking: No sig reported)  . [DISCONTINUED] lidocaine (XYLOCAINE) 2 % solution APPLY A SMALL AMOUNT OVER SORE LESION UP TO EVERY 4 HOURS AS NEEDED FOR PAIN   No facility-administered encounter medications on file as of 07/24/2020.    Allergy: No Known Allergies  Social Hx:   Social History   Socioeconomic History  . Marital status: Married    Spouse name: Not on file  . Number of children: Not on file  . Years of education: Not on file  . Highest education level: Not on file   Occupational History  . Occupation: retired  Tobacco Use  . Smoking status: Former Smoker    Packs/day: 0.50    Years: 2.00    Pack years: 1.00  . Smokeless tobacco: Never Used  . Tobacco comment: Smoke in 20's  Vaping Use  . Vaping Use: Never used  Substance and Sexual Activity  . Alcohol use: Yes    Alcohol/week: 7.0 standard drinks    Types: 7 Glasses of wine per week  . Drug use: No  . Sexual activity: Yes    Partners: Male    Birth control/protection: Post-menopausal  Other Topics Concern  . Not on file  Social History Narrative  . Not on file   Social Determinants of Health   Financial Resource Strain: Not on file  Food Insecurity: Not on file  Transportation Needs: Not on file  Physical Activity: Not on file  Stress: Not on file  Social Connections: Not on file  Intimate Partner Violence: Not on file    Past Surgical Hx:  Past Surgical History:  Procedure Laterality Date  . BLADDER SUSPENSION    . CHOLECYSTECTOMY    . COLONOSCOPY    . INGUINAL LYMPHADENECTOMY Bilateral 07/11/2020   Procedure: INGUINAL LYMPHADENECTOMY;  Surgeon: Everitt Amber, MD;  Location: [facility];  Service: Gynecology;  Laterality: Bilateral;  . TOTAL KNEE ARTHROPLASTY Right 04/25/2020   Procedure: RIGHT TOTAL KNEE ARTHROPLASTY;  Surgeon: Meredith Pel, MD;  Location: [facility];  Service: Orthopedics;  Laterality: Right;  . WISDOM TOOTH EXTRACTION      Past Medical Hx:  Past Medical History:  Diagnosis Date  . Arthritis   . Chicken pox   . Chronic back pain   . HLD (hyperlipidemia)    diet controlled  . Hypertension   . Recurrent upper respiratory infection (URI)   . Seasonal allergies   . Vulvar cancer [facility])     Past Gynecological History:  SVD x 4. No hx of abnormal paps. No LMP recorded. Patient is postmenopausal.  Family Hx:  Family History  Problem Relation Age of Onset  . Hyperlipidemia Mother   . Hypertension Mother   . Stroke Mother   . Breast cancer Mother   .  Prostate cancer Father   . Hypertension Maternal Grandmother   . Hypertension Paternal Grandmother   . Stroke Paternal Grandmother   . Allergic rhinitis Neg Hx   . Asthma Neg Hx   . Ovarian cancer Neg Hx   . Uterine cancer Neg Hx   . Colon cancer Neg Hx   . Pancreatic cancer Neg Hx     Review of Systems:  Constitutional  Reports weight loss  ENT Normal appearing ears and nares bilaterally Skin/Breast  No rash, sores, jaundice, itching, dryness Cardiovascular  No chest pain, shortness of breath, or edema  Pulmonary  No cough or wheeze.  Gastro Intestinal  No nausea, vomitting, or diarrhoea. No bright red blood per rectum, no abdominal pain, change in bowel movement, or constipation.  Genito Urinary  No frequency, urgency, dysuria, + vulvar pruritis Musculo Skeletal  No myalgia, arthralgia, joint swelling or pain  Neurologic  No weakness, numbness, change in gait,  Psychology  No depression, anxiety, insomnia.   Vitals:  Blood pressure (!) 155/88, pulse 78, temperature (!) 97.2 F (36.2 C), temperature source Tympanic, resp. rate 16, height 5\' 1"  (1.549 m), weight 133 lb 9.6 oz (60.6 kg), SpO2 100 %.  Physical Exam: WD in NAD Neck  Supple NROM, without any enlargements.  Lymph Node Survey Inguinal incisions healing normally with no drainage or fluctuance. No erythema on surrounding skin. Drain fluid clear.  Cardiovascular  Well perfused peripheries.  Lungs  No increased WOB Skin  No rash/lesions/breakdown  Psychiatry  Alert and oriented to person, place, and time  Abdomen  Normoactive bowel sounds, abdomen soft, non-tender and nonobese without evidence of hernia. Back No CVA tenderness Genito Urinary  Vulva/vagina: 6x4cm oval exophytic lesion on perineal body (crossing the midline) and extending to the left (more than right). Mass is fixed to the external anal sphincter but does not involve the anal or rectal mucosa.   Bladder/urethra:  No lesions or masses,  well supported bladder  Vagina: normal  Cervix: Normal appearing, no lesions.  Uterus:  Small, mobile, no parametrial involvement or nodularity.  Adnexa: no palpable masses. Rectal  Good tone, no masses no cul de sac nodularity.  Extremities  No bilateral cyanosis, clubbing or edema.   30 minutes of direct face to face counseling time was spent with the patient. This included discussion about  prognosis, therapy recommendations and postoperative side effects and are beyond the scope of routine postoperative care.    Thereasa Solo, MD  07/24/2020, 2:38 PM

## 2020-07-24 NOTE — Patient Instructions (Signed)
Dr Denman George is recommending 1 more week of drains in the groin before removal as they are consistently putting out >12mL.  Continue to avoid excessive walking and excessive movement of the legs as this can increase drain output.  Dr Denman George has prescribed 4 additional days of keflex for your to take.  It is safe for you to start radiation next week.  Dr Denman George and Dr Sondra Come are ordering a PET scan which is more likely to be covered given the stage 3 nature of your cancer.

## 2020-07-25 ENCOUNTER — Ambulatory Visit
Admission: RE | Admit: 2020-07-25 | Discharge: 2020-07-25 | Disposition: A | Discharge status: Home / Self Care | Payer: 59 | Source: Ambulatory Visit | Attending: Radiation Oncology | Admitting: Radiation Oncology

## 2020-07-25 DIAGNOSIS — Z510 Encounter for antineoplastic radiation therapy: Secondary | ICD-10-CM | POA: Diagnosis present

## 2020-07-25 DIAGNOSIS — C519 Malignant neoplasm of vulva, unspecified: Secondary | ICD-10-CM | POA: Diagnosis present

## 2020-07-26 ENCOUNTER — Other Ambulatory Visit: Payer: Self-pay

## 2020-07-26 ENCOUNTER — Telehealth: Payer: Self-pay | Admitting: Oncology

## 2020-07-26 ENCOUNTER — Inpatient Hospital Stay (HOSPITAL_BASED_OUTPATIENT_CLINIC_OR_DEPARTMENT_OTHER): Payer: 59 | Admitting: Gynecologic Oncology

## 2020-07-26 VITALS — BP 144/88 | HR 89 | Temp 97.1°F | Resp 18 | Ht 61.0 in | Wt 135.4 lb

## 2020-07-26 DIAGNOSIS — C519 Malignant neoplasm of vulva, unspecified: Secondary | ICD-10-CM

## 2020-07-26 DIAGNOSIS — C774 Secondary and unspecified malignant neoplasm of inguinal and lower limb lymph nodes: Secondary | ICD-10-CM

## 2020-07-26 DIAGNOSIS — T8149XA Infection following a procedure, other surgical site, initial encounter: Secondary | ICD-10-CM

## 2020-07-26 DIAGNOSIS — Z9079 Acquired absence of other genital organ(s): Secondary | ICD-10-CM

## 2020-07-26 DIAGNOSIS — Z4803 Encounter for change or removal of drains: Secondary | ICD-10-CM

## 2020-07-26 NOTE — Telephone Encounter (Signed)
Christina [name] called and said her right drainage bulb is not working.  She tries to compress it and it does not hold.  She also mentioned that her right tube looked like it came out 2 inches and she can see a white wire.  She pushed the tube back in.  She has also tried switching the bulb from the left to the right side and it does not work.  She also cut a portion off the right tube in case it had a leak.    Advised Christina John, NP and appointment scheduled today at 1:30.  Christina [name] is aware of the appointment time.

## 2020-07-26 NOTE — Progress Notes (Signed)
GYN Oncology Symptom Management  Christina [name] is a 60 year old female initially seen in consultation on Jun 22, 2020 at the request of Dr. Dellis Filbert for newly diagnosed vulvar cancer. The vulvar tumor measured 6 x 4 cm and she was staged as a clinical stage IB. CT imaging was ordered pre-operatively on 06/30/2020 resulting no gross evidence of metastatic disease in the chest, abdomen, and pelvis. On 07/11/2020, she underwent bilateral inguinofemoral lymphadenectomy with Dr. Everitt Amber. Final pathology revealed metastatic disease involving one of the three lymph nodes from the right inguinofemoral node resection and 2 out of 4 from the left inguinofemoral node resection. Her post-operative course was uneventful and she was discharged home on POD1 with bilateral JP drains in place.  She was seen in the office on 07/18/2020 and was noted to have post-op cellulitis of the right groin. She was started on keflex at that time with an additional week ordered at her last visit with Dr. Denman George on 07/24/2020. She called this am stating her right JP drain was not functioning. She reported to our nurse navigator the right tubing had came out around 2 inches with white tubing visualized. She attempted to push the tube back in and she switched the bulb from the left but the drain would not charge. She had also cut off a portion of the right tubing in case there was a hole in the tubing. She was advised to come to the office for further evaluation.   She denies fever or chills at home. She is still taking the keflex and states the erythema in the right groin has improved. She still has discomfort with the inner thighs intermittently and with walking which is related to post-op changes from the nodal dissection. No lower extremity edema noted. She states she was in the grocery store prior to our appointment today and the right drain fell out. No other concerns voiced.   Drain output: 07/24/20: 135 cc from the left, 123 cc on the  right 07/25/20: 120 cc from the left, 118 cc on the right 07/26/20: 35 cc from the left, 0 cc from the right  Exam:  Alert, oriented, in no acute distress. Ambulating without difficulty. Holding her right inguinal drain in her hand that has been totally removed from the insertion site (occurred while at the grocery store earlier today). Right drain insertion site assessed. No signs of infection. Right drain and tubing completely intact. No stitch material noted at the insertion site. No signs of infection at the site. Dry dressing applied over the site. Bilateral groin incisions intact with no erythema or drainage. Left JP drain assessed and still in place. Tubing stripped. 15 cc emptied from bulb. Dressing changed. Insertion site without signs of infection.     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 The right inguinal drain has been removed (fell out prior to this visit earlier today). Insertion site care for the previous right drain discussed. She is advised to monitor the area and to call for any changes such as increased pain, redness, swelling/firmness in the area. She is advised to continue monitoring the left drain output. Advised to keep the drain and tubing connected at all times and discussed increased chance of infection with disconnecting the bulb and tubing. Reportable signs and symptoms discussed. She is to follow up as planned for next week  for evaluation for left drain removal or sooner if needed.

## 2020-07-26 NOTE — Patient Instructions (Signed)
Continue monitoring output from the left drain. You can keep a clean dry dressing over the right drain site until it scabs over. Plan to follow up as planned or sooner if needed.

## 2020-07-27 ENCOUNTER — Telehealth: Payer: Self-pay | Admitting: Oncology

## 2020-07-27 NOTE — Telephone Encounter (Signed)
Christina [name] left a message saying her left drain is not draining but it is still leaking.  She is not able to come in today but would like to come in tomorrow to have it removed.

## 2020-07-27 NOTE — Telephone Encounter (Signed)
Called Dewey Neukam back and advised her to keep stripping the drain tubing today and let us know how it is doing tomorrow morning.  She verbalized agreement and said it is starting to drain a little bit.

## 2020-07-28 ENCOUNTER — Encounter: Payer: Self-pay | Admitting: Gynecologic Oncology

## 2020-07-28 ENCOUNTER — Inpatient Hospital Stay: Payer: 59 | Admitting: Gynecologic Oncology

## 2020-07-28 ENCOUNTER — Inpatient Hospital Stay (HOSPITAL_BASED_OUTPATIENT_CLINIC_OR_DEPARTMENT_OTHER): Payer: 59 | Admitting: Gynecologic Oncology

## 2020-07-28 ENCOUNTER — Other Ambulatory Visit: Payer: Self-pay

## 2020-07-28 VITALS — BP 142/99 | HR 85 | Temp 98.4°F | Resp 18 | Ht 61.0 in | Wt 137.2 lb

## 2020-07-28 DIAGNOSIS — Z4803 Encounter for change or removal of drains: Secondary | ICD-10-CM

## 2020-07-28 DIAGNOSIS — T8149XA Infection following a procedure, other surgical site, initial encounter: Secondary | ICD-10-CM

## 2020-07-28 NOTE — Progress Notes (Signed)
GYN Oncology Symptom Management  Christina [name] is a 60 year old female initially seen in consultation on Jun 22, 2020 at the request of Dr. Dellis Filbert for newly diagnosed vulvar cancer. The vulvar tumor measured 6 x 4 cm and she was staged as a clinical stage IB. CT imaging was ordered pre-operatively on 06/30/2020 resulting no gross evidence of metastatic disease in the chest, abdomen, and pelvis. On 07/11/2020, she underwent bilateral inguinofemoral lymphadenectomy with Dr. Everitt Amber. Final pathology revealed metastatic disease involving one of the three lymph nodes from the right inguinofemoral node resection and 2 out of 4 from the left inguinofemoral node resection. Her post-operative course was uneventful and she was discharged home on POD1 with bilateral JP drains in place.  She was seen in the office on 07/18/2020 and was noted to have post-op cellulitis of the right groin. She was started on keflex at that time with an additional week ordered at her last visit with Dr. Denman George on 07/24/2020. She called on 07/26/2020 with drain issues and presented to the office with the right drain completely removed. The left drain was kept in place at that time and she was advised to continue monitoring output. She called yesterday and spoke with Christina [name], nurse navigator, reporting no drainage from the left drain and leaking around the site. An appt was made today for drain evaluation and possible removal.   She denies fever or chills at home. She is still taking the additional week of keflex. Starting this am, the drain was not drained and continues to leak. She has been stripping the drain at home without any change.  Exam:  Alert, oriented, in no acute distress. Ambulating without difficulty. No drainage in the left JP drain. JP drain removed without difficulty. Dressing placed. Right drain insertion site assessed with no signs of infection. Dry dressing applied over the site. Bilateral groin incisions intact with no erythema or  drainage. Erythema faintly present on the right anterior thigh and now on the left thigh with increased warmth and edema. Erythema marked. No below the knee edema present bilaterally.     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 The left inguinal drain has been removed today in the office. Insertion site care discussed. She is advised to monitor the area and to call for any changes such as increased pain, redness, swelling/firmness in the area. Risk for lymphocyst discussed. Dr. Denman George updated on exam findings with the plan for the patient to continue keflex and to call for worsening symptoms. Reportable signs and symptoms discussed. She is to follow up as planned or sooner if needed.

## 2020-07-28 NOTE — Patient Instructions (Signed)
Continue taking the keflex to treat the thigh cellulitis. Dr. Denman George is hoping the redness will improve after having the drain removed. Monitor the marked red areas and call over the weekend if your symptoms worsen, fever develops, feeling poorly etc.   The after hours number is [phone] on the weekend and tell the after hours nurse you had surgery and they will get in touch with the GYN Onc on call (Dr. Berline Lopes) if needed.

## 2020-07-31 ENCOUNTER — Inpatient Hospital Stay (HOSPITAL_BASED_OUTPATIENT_CLINIC_OR_DEPARTMENT_OTHER): Payer: 59 | Admitting: Gynecologic Oncology

## 2020-07-31 ENCOUNTER — Telehealth: Payer: Self-pay | Admitting: Oncology

## 2020-07-31 ENCOUNTER — Encounter: Payer: Self-pay | Admitting: Gynecologic Oncology

## 2020-07-31 ENCOUNTER — Other Ambulatory Visit: Payer: Self-pay

## 2020-07-31 VITALS — BP 166/98 | HR 89 | Temp 98.8°F | Resp 16

## 2020-07-31 DIAGNOSIS — T8149XA Infection following a procedure, other surgical site, initial encounter: Secondary | ICD-10-CM

## 2020-07-31 DIAGNOSIS — I898 Other specified noninfective disorders of lymphatic vessels and lymph nodes: Secondary | ICD-10-CM

## 2020-07-31 DIAGNOSIS — R2242 Localized swelling, mass and lump, left lower limb: Secondary | ICD-10-CM

## 2020-07-31 NOTE — Progress Notes (Signed)
GYN Oncology Symptom Management  Christina [name] is a 60 year old female initially seen in consultation on Jun 22, 2020 at the request of Dr. Dellis Filbert for newly diagnosed vulvar cancer. The vulvar tumor measured 6 x 4 cm and she was staged as a clinical stage IB. CT imaging was ordered pre-operatively on 06/30/2020 resulting no gross evidence of metastatic disease in the chest, abdomen, and pelvis. On 07/11/2020, she underwent bilateral inguinofemoral lymphadenectomy with Dr. Everitt Amber. Final pathology revealed metastatic disease involving one of the three lymph nodes from the right inguinofemoral node resection and 2 out of 4 from the left inguinofemoral node resection. Her post-operative course was uneventful and she was discharged home on POD1 with bilateral JP drains in place.  She was seen in the office on 07/18/2020 and was noted to have post-op cellulitis of the right groin. She was started on keflex at that time with an additional week ordered at her last visit with Dr. Denman George on 07/24/2020. She called on 07/26/2020 with drain issues and presented to the office with the right drain completely removed. The left drain was kept in place at that time and she was advised to continue monitoring output. The left drain stopped draining on 07/28/2020 and was removed at that time.  She denies fever or chills at home. Saturday evening, she noticed a firmness/lump in the left upper thigh below the groin incision. She continues to report pain in the bilateral thigh region, which can interfere with walking. The thigh redness comes and goes. No issues reported with the previous drain insertion sites.   Exam:  Alert, oriented, in no acute distress. Ambulating. Bilateral thigh erythema has improved since last visit. JP insertion sites healing without erythema. Firmness most compatible with a small lymphocyst noted right below the left groin incision. No erythema. Minimal amount of firmness noted around the right groin incision. Dr.  Berline Lopes examined patient as well. Lymphedema of bilateral thighs noted but no active infection noted.      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 Given the lymphocyst size has not changed since Saturday evening, the plan would be for monitoring of the area. An US of the area has been ordered for Monday, June 20 in case the area may need potential drainage or drain placement. Patient advised this can be cancelled if not needed. She is advised to monitor the area and to call for any changes such as increased pain, redness, increased swelling/firmness in the area.  She is to follow up as planned or sooner if needed.

## 2020-07-31 NOTE — Telephone Encounter (Signed)
Christina [name] called and said her left side is hard and swollen by her groin incision.  She thinks it needs to be drained.  She did call the on-call number over the weekend and was told to call this morning.  She is emotional and frustrated that she is still having pain and can't walk without bending over.  She wants IV antibiotics because she is concerned that the Keflex is not working since the cellulitis started in her left leg after she was already taking it.  She denies having a fever and said the redness on both legs "comes and goes."  She is wondering if she needs to be seen today.

## 2020-07-31 NOTE — Patient Instructions (Addendum)
Plan to check in over the next several days with updates on your symptoms. If your symptoms worsen during this time, call the office to be evaluated. We will go ahead and arrange for an ultrasound of the left thigh on Friday and if not needed, we will cancel this. The redness on your thighs has improved. Continue to monitor and this may come and go with the lymphedema in that area.

## 2020-07-31 NOTE — Telephone Encounter (Signed)
Called Audrea Muscat back and scheduled apt today with [name], NP.

## 2020-08-01 ENCOUNTER — Ambulatory Visit: Payer: 59 | Admitting: Gynecologic Oncology

## 2020-08-02 DIAGNOSIS — Z510 Encounter for antineoplastic radiation therapy: Secondary | ICD-10-CM | POA: Diagnosis not present

## 2020-08-03 ENCOUNTER — Other Ambulatory Visit: Payer: Self-pay

## 2020-08-03 ENCOUNTER — Telehealth: Payer: Self-pay

## 2020-08-03 ENCOUNTER — Ambulatory Visit
Admission: RE | Admit: 2020-08-03 | Discharge: 2020-08-03 | Disposition: A | Discharge status: Home / Self Care | Payer: 59 | Source: Ambulatory Visit | Attending: Radiation Oncology | Admitting: Radiation Oncology

## 2020-08-03 DIAGNOSIS — Z510 Encounter for antineoplastic radiation therapy: Secondary | ICD-10-CM | POA: Diagnosis not present

## 2020-08-03 DIAGNOSIS — C519 Malignant neoplasm of vulva, unspecified: Secondary | ICD-10-CM

## 2020-08-03 NOTE — Telephone Encounter (Signed)
Spoke with Christina [name] to follow up from her visit on 6/13. She states there have been no changes and the lump is not any bigger. She has completed all of her keflex. Reports pain is subsiding. Denies increasing redness at the site, fever, chills or drainage. Patient instructed to call with any questions or concerns.

## 2020-08-04 ENCOUNTER — Other Ambulatory Visit: Payer: Self-pay | Admitting: Allergy & Immunology

## 2020-08-04 ENCOUNTER — Encounter: Payer: Self-pay | Admitting: Gynecologic Oncology

## 2020-08-04 ENCOUNTER — Telehealth: Payer: Self-pay | Admitting: Oncology

## 2020-08-04 ENCOUNTER — Other Ambulatory Visit: Payer: Self-pay | Admitting: Gynecologic Oncology

## 2020-08-04 ENCOUNTER — Ambulatory Visit
Admission: RE | Admit: 2020-08-04 | Discharge: 2020-08-04 | Disposition: A | Discharge status: Home / Self Care | Payer: 59 | Source: Ambulatory Visit | Attending: Radiation Oncology | Admitting: Radiation Oncology

## 2020-08-04 DIAGNOSIS — I898 Other specified noninfective disorders of lymphatic vessels and lymph nodes: Secondary | ICD-10-CM | POA: Insufficient documentation

## 2020-08-04 DIAGNOSIS — C519 Malignant neoplasm of vulva, unspecified: Secondary | ICD-10-CM

## 2020-08-04 DIAGNOSIS — Z510 Encounter for antineoplastic radiation therapy: Secondary | ICD-10-CM | POA: Diagnosis not present

## 2020-08-04 NOTE — Telephone Encounter (Signed)
Called Christina [name] and advised her that the hemorrhoid cream should be ok if she only applies it after radiation.  Also advised that I will let Dr. Sondra Come know that she is using it.  Also discussed that the [place] for Monday can be canceled if the area has not gotten any bigger and symptoms are improving.  She said the area is about the same and her symptoms are improving.  She would like to cancel the [place] and will call on Monday to give [place] an update.

## 2020-08-07 ENCOUNTER — Other Ambulatory Visit: Payer: Self-pay

## 2020-08-07 ENCOUNTER — Ambulatory Visit
Admission: RE | Admit: 2020-08-07 | Discharge: 2020-08-07 | Disposition: A | Discharge status: Home / Self Care | Payer: 59 | Source: Ambulatory Visit | Attending: Radiation Oncology | Admitting: Radiation Oncology

## 2020-08-07 ENCOUNTER — Encounter: Payer: Self-pay | Admitting: Gynecologic Oncology

## 2020-08-07 ENCOUNTER — Ambulatory Visit (HOSPITAL_COMMUNITY): Payer: 59

## 2020-08-07 ENCOUNTER — Other Ambulatory Visit: Payer: Self-pay | Admitting: Radiation Oncology

## 2020-08-07 DIAGNOSIS — Z510 Encounter for antineoplastic radiation therapy: Secondary | ICD-10-CM | POA: Diagnosis not present

## 2020-08-07 DIAGNOSIS — C519 Malignant neoplasm of vulva, unspecified: Secondary | ICD-10-CM

## 2020-08-07 MED ORDER — OXYCODONE HCL 5 MG PO TABS: 5.0000 mg | ORAL_TABLET | ORAL | 0 refills | Status: DC | PRN

## 2020-08-08 ENCOUNTER — Ambulatory Visit
Admission: RE | Admit: 2020-08-08 | Discharge: 2020-08-08 | Disposition: A | Discharge status: Home / Self Care | Payer: 59 | Source: Ambulatory Visit | Attending: Radiation Oncology | Admitting: Radiation Oncology

## 2020-08-08 ENCOUNTER — Ambulatory Visit (HOSPITAL_COMMUNITY)
Admission: RE | Admit: 2020-08-08 | Discharge: 2020-08-08 | Disposition: A | Discharge status: Home / Self Care | Payer: 59 | Source: Ambulatory Visit | Attending: Gynecologic Oncology | Admitting: Gynecologic Oncology

## 2020-08-08 DIAGNOSIS — C519 Malignant neoplasm of vulva, unspecified: Secondary | ICD-10-CM | POA: Diagnosis present

## 2020-08-08 DIAGNOSIS — Z510 Encounter for antineoplastic radiation therapy: Secondary | ICD-10-CM | POA: Diagnosis not present

## 2020-08-08 LAB — GLUCOSE, CAPILLARY: Glucose-Capillary: 109 mg/dL — ABNORMAL HIGH (ref 70–99)

## 2020-08-08 MED ORDER — FLUDEOXYGLUCOSE F - 18 (FDG) INJECTION
7.0000 | Freq: Once | INTRAVENOUS | Status: AC | PRN
  Administered 2020-08-08: 6.82 via INTRAVENOUS

## 2020-08-09 ENCOUNTER — Other Ambulatory Visit: Payer: Self-pay

## 2020-08-09 ENCOUNTER — Ambulatory Visit
Admission: RE | Admit: 2020-08-09 | Discharge: 2020-08-09 | Disposition: A | Discharge status: Home / Self Care | Payer: 59 | Source: Ambulatory Visit | Attending: Radiation Oncology | Admitting: Radiation Oncology

## 2020-08-09 DIAGNOSIS — Z510 Encounter for antineoplastic radiation therapy: Secondary | ICD-10-CM | POA: Diagnosis not present

## 2020-08-10 ENCOUNTER — Ambulatory Visit
Admission: RE | Admit: 2020-08-10 | Discharge: 2020-08-10 | Disposition: A | Discharge status: Home / Self Care | Payer: 59 | Source: Ambulatory Visit | Attending: Radiation Oncology | Admitting: Radiation Oncology

## 2020-08-10 ENCOUNTER — Telehealth: Payer: Self-pay

## 2020-08-10 DIAGNOSIS — Z510 Encounter for antineoplastic radiation therapy: Secondary | ICD-10-CM | POA: Diagnosis not present

## 2020-08-10 NOTE — Telephone Encounter (Signed)
Spoke with Christina [name] regarding her PET scan results. Lymph nodes in the groin are lighting up on scan which we knew but no pelvic, abdominal, aortic, organ or skeletal involvement.   Pt. States she would like to start exercising (light walking, the bike at the gym and some light swimming). She read that exercise is good for lymphedema. Per [name], NP she needs to continue to rest and take it easy for the next two weeks.   Patient will call with any questions or concerns.

## 2020-08-11 ENCOUNTER — Ambulatory Visit
Admission: RE | Admit: 2020-08-11 | Discharge: 2020-08-11 | Disposition: A | Discharge status: Home / Self Care | Payer: 59 | Source: Ambulatory Visit | Attending: Radiation Oncology | Admitting: Radiation Oncology

## 2020-08-11 ENCOUNTER — Encounter: Payer: 59 | Admitting: Gynecologic Oncology

## 2020-08-11 ENCOUNTER — Other Ambulatory Visit: Payer: Self-pay

## 2020-08-11 DIAGNOSIS — Z510 Encounter for antineoplastic radiation therapy: Secondary | ICD-10-CM | POA: Diagnosis not present

## 2020-08-13 ENCOUNTER — Encounter: Payer: Self-pay | Admitting: Gynecologic Oncology

## 2020-08-13 DIAGNOSIS — Z510 Encounter for antineoplastic radiation therapy: Secondary | ICD-10-CM | POA: Diagnosis not present

## 2020-08-14 ENCOUNTER — Ambulatory Visit
Admission: RE | Admit: 2020-08-14 | Discharge: 2020-08-14 | Disposition: A | Discharge status: Home / Self Care | Payer: 59 | Source: Ambulatory Visit | Attending: Radiation Oncology | Admitting: Radiation Oncology

## 2020-08-14 ENCOUNTER — Other Ambulatory Visit: Payer: Self-pay

## 2020-08-14 DIAGNOSIS — Z510 Encounter for antineoplastic radiation therapy: Secondary | ICD-10-CM | POA: Diagnosis not present

## 2020-08-15 ENCOUNTER — Ambulatory Visit (HOSPITAL_COMMUNITY)
Admission: RE | Admit: 2020-08-15 | Discharge: 2020-08-15 | Disposition: A | Discharge status: Home / Self Care | Payer: 59 | Source: Ambulatory Visit | Attending: Radiation Oncology | Admitting: Radiation Oncology

## 2020-08-15 ENCOUNTER — Ambulatory Visit
Admission: RE | Admit: 2020-08-15 | Discharge: 2020-08-15 | Disposition: A | Discharge status: Home / Self Care | Payer: 59 | Source: Ambulatory Visit | Attending: Radiation Oncology | Admitting: Radiation Oncology

## 2020-08-15 ENCOUNTER — Other Ambulatory Visit: Payer: Self-pay | Admitting: Gynecologic Oncology

## 2020-08-15 ENCOUNTER — Encounter: Payer: Self-pay | Admitting: Gynecologic Oncology

## 2020-08-15 ENCOUNTER — Other Ambulatory Visit: Payer: Self-pay | Admitting: Radiology

## 2020-08-15 ENCOUNTER — Other Ambulatory Visit: Payer: Self-pay

## 2020-08-15 ENCOUNTER — Telehealth: Payer: Self-pay | Admitting: Oncology

## 2020-08-15 DIAGNOSIS — C519 Malignant neoplasm of vulva, unspecified: Secondary | ICD-10-CM | POA: Diagnosis present

## 2020-08-15 DIAGNOSIS — M7989 Other specified soft tissue disorders: Secondary | ICD-10-CM

## 2020-08-15 DIAGNOSIS — M79605 Pain in left leg: Secondary | ICD-10-CM

## 2020-08-15 DIAGNOSIS — Z510 Encounter for antineoplastic radiation therapy: Secondary | ICD-10-CM | POA: Diagnosis not present

## 2020-08-15 DIAGNOSIS — I890 Lymphedema, not elsewhere classified: Secondary | ICD-10-CM

## 2020-08-15 NOTE — Progress Notes (Signed)
Left lower extremity venous duplex has been completed. Preliminary results can be found in CV Proc through chart review.  Results were faxed to Dr. Clabe Seal office.  08/15/20 2:14 PM Christina [name] RVT

## 2020-08-15 NOTE — Telephone Encounter (Signed)
Mal Amabile and she said she has a friend that is going through radiation and recommended getting a prescription calengella cream.  She is going to see Dr .Sondra Come today and will ask him about it.  She is also wondering when she [place] again.  Advised her to wait at least a month after radiation is finished and when she does not have any open wounds.  She verbalized understanding and agreement.

## 2020-08-15 NOTE — Progress Notes (Signed)
Per Dr. Denman George, referral for lymphedema evaluation and treatment. 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

## 2020-08-16 ENCOUNTER — Ambulatory Visit
Admission: RE | Admit: 2020-08-16 | Discharge: 2020-08-16 | Disposition: A | Discharge status: Home / Self Care | Payer: 59 | Source: Ambulatory Visit | Attending: Radiation Oncology | Admitting: Radiation Oncology

## 2020-08-16 ENCOUNTER — Ambulatory Visit: Payer: 59 | Attending: Gynecologic Oncology | Admitting: Physical Therapy

## 2020-08-16 ENCOUNTER — Encounter: Payer: Self-pay | Admitting: Physical Therapy

## 2020-08-16 DIAGNOSIS — M6281 Muscle weakness (generalized): Secondary | ICD-10-CM | POA: Insufficient documentation

## 2020-08-16 DIAGNOSIS — Z483 Aftercare following surgery for neoplasm: Secondary | ICD-10-CM | POA: Diagnosis present

## 2020-08-16 DIAGNOSIS — R262 Difficulty in walking, not elsewhere classified: Secondary | ICD-10-CM | POA: Diagnosis present

## 2020-08-16 DIAGNOSIS — Z510 Encounter for antineoplastic radiation therapy: Secondary | ICD-10-CM | POA: Diagnosis not present

## 2020-08-16 DIAGNOSIS — R600 Localized edema: Secondary | ICD-10-CM | POA: Diagnosis not present

## 2020-08-16 NOTE — Therapy (Signed)
[place], [place], [address] Phone: [phone]   Fax:  [phone]  Physical Therapy Evaluation  Patient Details  Name: Christina [name] MRN: [id number] Date of Birth: June 14, 1960 Referring Provider (PT): [name]   Encounter Date: 08/16/2020   PT End of Session - 08/16/20 1454     Visit Number 1    Number of Visits 9    Date for PT Re-Evaluation 09/13/20    PT Start Time 8338    PT Stop Time 2505    PT Time Calculation (min) 49 min    Activity Tolerance Patient tolerated treatment well    Behavior During Therapy [facility] for tasks assessed/performed             Past Medical History:  Diagnosis Date   Arthritis    Chicken pox    Chronic back pain    HLD (hyperlipidemia)    diet controlled   Hypertension    Recurrent upper respiratory infection (URI)    Seasonal allergies    Vulvar cancer ([place])     Past Surgical History:  Procedure Laterality Date   BLADDER SUSPENSION     CHOLECYSTECTOMY     COLONOSCOPY     INGUINAL LYMPHADENECTOMY Bilateral 07/11/2020   Procedure: INGUINAL LYMPHADENECTOMY;  Surgeon: Everitt Amber, MD;  Location: [facility];  Service: Gynecology;  Laterality: Bilateral;   TOTAL KNEE ARTHROPLASTY Right 04/25/2020   Procedure: RIGHT TOTAL KNEE ARTHROPLASTY;  Surgeon: Meredith Pel, MD;  Location: [facility];  Service: Orthopedics;  Laterality: Right;   WISDOM TOOTH EXTRACTION      There were no vitals filed for this visit.    Subjective Assessment - 08/16/20 1417     Subjective I have swelling in my groin. It has been there two or three weeks.    Pertinent History hx of vulvar cancer and underwent bilateral inguinal lymphadenectomy 07/11/20, developed cellulitis 07/18/20, currently undergoing radiation, 04/25/20- R TKA, pt got an ablation in Jan 2022 has chronic back pain, L4/L5 are compressed    Patient Stated Goals to be out of pain    Currently in Pain? Yes    Pain Score 1      Pain Location --   inner thigh   Pain Orientation Right;Left;Medial    Pain Descriptors / Indicators Aching    Pain Type Acute pain    Pain Onset More than a month ago    Pain Frequency Intermittent    Aggravating Factors  walking    Pain Relieving Factors sitting, lying down    Effect of Pain on Daily Activities does not move much                [facility] PT Assessment - 08/16/20 0001       Assessment   Medical Diagnosis vuvlar cancer    Referring Provider (PT) Melissa Cross    Onset Date/Surgical Date 07/11/20    Hand Dominance Right    Prior Therapy PT for R TKA 05/2020      Precautions   Precautions Other (comment)   post surgical     Restrictions   Weight Bearing Restrictions No      Balance Screen   Has the patient fallen in the past 6 months No    Has the patient had a decrease in activity level because of a fear of falling?  No    Is the patient reluctant to leave their home because of a fear of falling?  No      Home Environment   Living Environment Private residence    Living Arrangements Spouse/significant other    Available Help at Discharge Family    Type of [place]      Posture/Postural Control   Posture/Postural Control Postural limitations    Postural Limitations Rounded Shoulders;Forward head               LYMPHEDEMA/ONCOLOGY QUESTIONNAIRE - 08/16/20 0001       Type   Cancer Type vulvar cancer      Treatment   Active Radiation Treatment Yes      Lymphedema Assessments   Lymphedema Assessments Lower extremities      Right Lower Extremity Lymphedema   30 cm Proximal to Suprapatella 56.5 cm    20 cm Proximal to Suprapatella 51.5 cm    10 cm Proximal to Suprapatella 43.2 cm    At Midpatella/Popliteal Crease 36.3 cm    30 cm Proximal to Floor at Lateral Plantar Foot 35.5 cm    20 cm Proximal to Floor at Lateral Plantar Foot 28.5 1    10  cm Proximal to Floor at Lateral Malleoli 20.2 cm    5 cm Proximal to 1st MTP Joint 20.8 cm     Across MTP Joint 21.5 cm    Around Proximal Great Toe 7.2 cm      Left Lower Extremity Lymphedema   30 cm Proximal to Suprapatella 62 cm    20 cm Proximal to Suprapatella 52.7 cm    10 cm Proximal to Suprapatella 43 cm    At Midpatella/Popliteal Crease 32 cm    30 cm Proximal to Floor at Lateral Plantar Foot 35.5 cm    20 cm Proximal to Floor at Lateral Plantar Foot 27.5 cm    10 cm Proximal to Floor at Lateral Malleoli 19.5 cm    5 cm Proximal to 1st MTP Joint 20.8 cm    Across MTP Joint 21.7 cm    Around Proximal Great Toe 7.5 cm                     Objective measurements completed on examination: See above findings.               PT Education - 08/16/20 1503     Education Details anatomy and physiology of lymphatic system, lymphedema vs hematoma/seroma, need for MLD and compression garments    Person(s) Educated Patient    Methods Explanation    Comprehension Verbalized understanding                PT Long Term Goals - 08/16/20 1501       PT LONG TERM GOAL #1   Title Pt will be independent with self MLD for long term management of edema.    Time 4    Period Weeks    Status New    Target Date 09/13/20      PT LONG TERM GOAL #2   Title Pt will report a 50% improvement in swelling surrounding left groin scar to allow improved comfort    Time 4    Period Weeks    Status New    Target Date 09/13/20      PT LONG TERM GOAL #3   Title Pt will be independent in Strength ABC program to begin strengthening and stretching once she is cleared by her surgeon.    Time 4    Period Weeks    Status New  Target Date 09/13/20      PT LONG TERM GOAL #4   Title Pt will obtain appropriate compression garments for long term management of edema.    Time 4    Period Weeks    Status New    Target Date 09/13/20                    Plan - 08/16/20 1455     Clinical Impression Statement Pt reports to PT today with L groin swelling after  undergoing a bilateral inguinal lymphadenectomy on 07/11/20 for treatment of vulvar cancer. She developed cellulitis on 07/18/20. Pt reports she has also been having discomfort in bilateral inner thighs with walking since her surgery and also her calf. Pt does have a history of back pain and compressed discs that could cause referred pain in this area. Pt has been unable to walk or exercise for the past 6 weeks due to surgery. Pt is eager to get back to physical activity. Her swelling has very distinguishable borders that is not indicative of lymphedema. It seems very similar to a seroma or hematoma. Pt does have some mild swelling throughout LLE that is visible mostly had her upper thigh. She may be developing lymphedema in her LLE. She would benefit from skilled PT services to decrease swelling and pain and assist pt with obtaining appropriate compression garments.    Personal Factors and Comorbidities Comorbidity 1    Comorbidities hx of chronic back pain with compressed discs    Examination-Activity Limitations Locomotion Level    Stability/Clinical Decision Making Evolving/Moderate complexity    Clinical Decision Making Moderate    Rehab Potential Good    PT Frequency 2x / week    PT Duration 4 weeks    PT Treatment/Interventions ADLs/Self Care Home Management;Therapeutic exercise;Therapeutic activities;Patient/family education;Orthotic Fit/Training;Manual techniques;Manual lymph drainage;Compression bandaging;Scar mobilization;Taping;Vasopneumatic Device    PT Next Visit Plan begin MLD to LLE and instruct pt, soft tissue mobilization over left groin scar, see if pt was measured for garments    Consulted and Agree with Plan of Care Patient             Patient will benefit from skilled therapeutic intervention in order to improve the following deficits and impairments:  Pain, Increased edema, Decreased scar mobility, Difficulty walking  Visit Diagnosis: Localized edema  Difficulty  walking     Problem List 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[place]) 07/11/2020   Vulva cancer ([place]) 07/11/2020   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   Arthritis of right knee    S/P total knee arthroplasty, 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   Degenerative arthritis of knee, bilateral 12/31/2019   Patellofemoral arthritis of left knee 03/10/2019   Acute medial meniscus tear of left knee 11/19/2018   Sprain of medial collateral ligament of left knee 09/01/2018   Neck strain, initial encounter 08/28/2018   Degenerative disc disease, cervical 08/28/2018   Left lumbar radiculopathy 03/05/2018   Right lateral epicondylitis 10/30/2017   SI (sacroiliac) joint dysfunction 10/30/2017   Nonallopathic lesion of sacral region 10/30/2017   Nonallopathic lesion of lumbar region 10/30/2017   Nonallopathic lesion of thoracic region 10/30/2017   History of recurrent UTIs 04/11/2017   Hypertension, essential, benign 10/29/2016   Allergic rhinitis 10/29/2016    Allyson Sabal [facility] 08/16/2020, 3:04 PM  [place],  [place], [address] Phone: [phone]   Fax:  [phone]  Name: Murle Hellstrom Karam MRN: [id number] Date of Birth: 07/07/1960  Manus Gunning, PT 08/16/20 3:04 PM

## 2020-08-17 ENCOUNTER — Other Ambulatory Visit: Payer: Self-pay | Admitting: Radiation Oncology

## 2020-08-17 ENCOUNTER — Other Ambulatory Visit: Payer: Self-pay

## 2020-08-17 ENCOUNTER — Ambulatory Visit
Admission: RE | Admit: 2020-08-17 | Discharge: 2020-08-17 | Disposition: A | Discharge status: Home / Self Care | Payer: 59 | Source: Ambulatory Visit | Attending: Radiation Oncology | Admitting: Radiation Oncology

## 2020-08-17 ENCOUNTER — Encounter: Payer: Self-pay | Admitting: Radiology

## 2020-08-17 ENCOUNTER — Telehealth: Payer: Self-pay | Admitting: Gynecologic Oncology

## 2020-08-17 ENCOUNTER — Ambulatory Visit: Payer: 59 | Admitting: Physical Therapy

## 2020-08-17 DIAGNOSIS — R600 Localized edema: Secondary | ICD-10-CM

## 2020-08-17 DIAGNOSIS — Z483 Aftercare following surgery for neoplasm: Secondary | ICD-10-CM

## 2020-08-17 DIAGNOSIS — R262 Difficulty in walking, not elsewhere classified: Secondary | ICD-10-CM

## 2020-08-17 DIAGNOSIS — Z510 Encounter for antineoplastic radiation therapy: Secondary | ICD-10-CM | POA: Diagnosis not present

## 2020-08-17 DIAGNOSIS — M6281 Muscle weakness (generalized): Secondary | ICD-10-CM

## 2020-08-17 MED ORDER — LIDOCAINE VISCOUS HCL 2 % MT SOLN: OROMUCOSAL | 3 refills | Status: DC

## 2020-08-17 NOTE — Patient Instructions (Signed)
[email or web address] Resources ( far right tab) Self MLD lower extremity  AVOID RADIATED SKIN  Encourage deep breathing, Standing up straight, standing butt squeeze, standing heel raise OK to do your back exercises and walking as tolerated.  Start slow and progress slowly.

## 2020-08-17 NOTE — Therapy (Signed)
[place], [place], [address] Phone: [phone]   Fax:  [phone]  Physical Therapy Treatment  Patient Details  Name: Christina [name] MRN: [id number] Date of Birth: 12/14/1960 Referring Provider (PT): [name]   Encounter Date: 08/17/2020   PT End of Session - 08/17/20 1732     Visit Number 2    Number of Visits 9    Date for PT Re-Evaluation 09/13/20    PT Start Time 1300    PT Stop Time 3832    PT Time Calculation (min) 55 min    Activity Tolerance Patient tolerated treatment well    Behavior During Therapy [facility] for tasks assessed/performed             Past Medical History:  Diagnosis Date   Arthritis    Chicken pox    Chronic back pain    HLD (hyperlipidemia)    diet controlled   Hypertension    Recurrent upper respiratory infection (URI)    Seasonal allergies    Vulvar cancer ([place])     Past Surgical History:  Procedure Laterality Date   BLADDER SUSPENSION     CHOLECYSTECTOMY     COLONOSCOPY     INGUINAL LYMPHADENECTOMY Bilateral 07/11/2020   Procedure: INGUINAL LYMPHADENECTOMY;  Surgeon: Everitt Amber, MD;  Location: [facility];  Service: Gynecology;  Laterality: Bilateral;   TOTAL KNEE ARTHROPLASTY Right 04/25/2020   Procedure: RIGHT TOTAL KNEE ARTHROPLASTY;  Surgeon: Meredith Pel, MD;  Location: [facility];  Service: Orthopedics;  Laterality: Right;   WISDOM TOOTH EXTRACTION      There were no vitals filed for this visit.   Subjective Assessment - 08/17/20 1310     Subjective Pt has had 11 radiation treatments and will go until August 6 . She walked around the block with her husband yesterday and feels better already . Her total knee is not bothering her and she is anxious to get back to her activities and play pickleball    Pertinent History hx of vulvar cancer and underwent bilateral inguinal lymphadenectomy 07/11/20, developed cellulitis 07/18/20, currently undergoing  radiation, 04/25/20- R TKA, pt got an ablation in Jan 2022 has chronic back pain, L4/L5 are compressed Pt has had problems with incontinece in the past and has pelvic PT and surgery for that    Currently in Pain? Yes    Pain Score 6    at left inner tight at times   Pain Location --   inner thigh   Pain Orientation Right;Left   left worse than right   Pain Descriptors / Indicators Aching    Pain Type Acute pain    Pain Radiating Towards pt has occasional pain in her left calf    Pain Onset More than a month ago    Pain Frequency Intermittent    Aggravating Factors  pt is not sure    Pain Relieving Factors pt not sure                [facility] PT Assessment - 08/17/20 0001       Observation/Other Assessments   Observations visible edema on both upper medial thighs to midthigh area with darkening of skin in radiation field. well healed incisions in bilateral inguinal areas with large amount of firm fullness under left incision (??seroma??hematoma??) drain scars in lower abdomen  [facility] PT Treatment/Exercise - 08/17/20 0001       Manual Therapy   Manual Therapy Manual Lymphatic Drainage (MLD);Edema management    Manual therapy comments gave pt a piece of large tg soft to wear doubled over at thigh to inguinal area to see if it will help with swelling/discomfort but she will not be able to walk around with it on as it rolls down below the full area  under incision and her underwear cuts in her leg at inguinal crease above this area. Talked with pt about getting some "slipshorts" or compression shorts that will not cut into this area and provide gentle compression    Edema Management instructed in basics of lymphatic system and MLD.  gave link for [email or web address] self MLD videos encourage pt to continue deep breathing, walking and her back exercises that have helped her back    Manual Lymphatic Drainage (MLD) short neck, superficial and deep  abdmonials with diaphragmatic breathing left axillary nodes with extra work on abdominals, left inguinal-axillary anastamosis. left lateral hip and thigh, medial hip to knee and return along pathways                      PT Short Term Goals - 06/21/20 1734       PT SHORT TERM GOAL #1   Title Improve R knee flexion AROM to 125 degrees.    Baseline 130 06/21/2020    Time 4    Period Weeks    Status Achieved    Target Date 06/29/20               PT Long Term Goals - 08/16/20 1501       PT LONG TERM GOAL #1   Title Pt will be independent with self MLD for long term management of edema.    Time 4    Period Weeks    Status New    Target Date 09/13/20      PT LONG TERM GOAL #2   Title Pt will report a 50% improvement in swelling surrounding left groin scar to allow improved comfort    Time 4    Period Weeks    Status New    Target Date 09/13/20      PT LONG TERM GOAL #3   Title Pt will be independent in Strength ABC program to begin strengthening and stretching once she is cleared by her surgeon.    Time 4    Period Weeks    Status New    Target Date 09/13/20      PT LONG TERM GOAL #4   Title Pt will obtain appropriate compression garments for long term management of edema.    Time 4    Period Weeks    Status New    Target Date 09/13/20                   Plan - 08/17/20 1733     Clinical Impression Statement First session of MLD today with brief instruction of deep breathing and technique.  Pt is feeling better today as she has started with walking and exercises for her back. She said that her leg felt better after MLD. Encouraged her to get light compression shorts and gave her a piece of Tg soft to try if she can keep it from rolling down her thigh. Do not feel pt needs to be measuraed for thigh high stockings yet as she did not have any  swelling in her lower leg today.  Will see how she does over the next few weeks and possibly order them if she  has more swelling as radiation progresses. Blaire agreed.    Stability/Clinical Decision Making Evolving/Moderate complexity    Rehab Potential Good    PT Frequency 2x / week    PT Duration 4 weeks    PT Treatment/Interventions ADLs/Self Care Home Management;Therapeutic exercise;Therapeutic activities;Patient/family education;Orthotic Fit/Training;Manual techniques;Manual lymph drainage;Compression bandaging;Scar mobilization;Taping;Vasopneumatic Device    PT Next Visit Plan cont  MLD to LLE and instruct pt with cues on technique consider soft tissue mobilization over left groin scar,monitoring sking for radiation changes see if pt got compression shorts. 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   Arthritis of right knee    S/P total knee arthroplasty, 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   Degenerative arthritis of knee, bilateral 12/31/2019   Patellofemoral arthritis of left knee 03/10/2019   Acute medial meniscus tear of left knee 11/19/2018   Sprain of medial collateral ligament of left knee 09/01/2018   Neck strain, initial encounter 08/28/2018   Degenerative disc disease, cervical 08/28/2018   Left lumbar radiculopathy 03/05/2018   Right lateral epicondylitis 10/30/2017   SI (sacroiliac) joint dysfunction 10/30/2017   Nonallopathic lesion of sacral region 10/30/2017   Nonallopathic lesion of lumbar region 10/30/2017   Nonallopathic lesion of thoracic region 10/30/2017    History of recurrent UTIs 04/11/2017   Hypertension, essential, benign 10/29/2016   Allergic rhinitis 10/29/2016   Donato Heinz. Owens Shark PT  [place] [facility]o 08/17/2020, 5:38 PM  [place], [place], [address] Phone: [phone]   Fax:  [phone]  Name: Anabia Weatherwax [place] MRN: [id number] Date of Birth: [birth year]

## 2020-08-17 NOTE — Telephone Encounter (Signed)
Called patient to check on status. She states she is doing well today. She went to the lymphedema clinic today and states she felt her leg felt better after having the therapy. She was able to walk around the block with her dog with less discomfort. She states she feels she is on the "spiral upward." Advised she has been cleared by Dr. Denman George to introduce activities including back exercises and lifting weights and see how she feels. All questions answered. Advised to call for any needs and to call if she needs to be seen in the office next week.

## 2020-08-18 ENCOUNTER — Ambulatory Visit
Admission: RE | Admit: 2020-08-18 | Discharge: 2020-08-18 | Disposition: A | Discharge status: Home / Self Care | Payer: 59 | Source: Ambulatory Visit | Attending: Radiation Oncology | Admitting: Radiation Oncology

## 2020-08-18 ENCOUNTER — Encounter: Payer: Self-pay | Admitting: Rehabilitation

## 2020-08-18 DIAGNOSIS — C519 Malignant neoplasm of vulva, unspecified: Secondary | ICD-10-CM | POA: Diagnosis present

## 2020-08-18 DIAGNOSIS — R300 Dysuria: Secondary | ICD-10-CM | POA: Insufficient documentation

## 2020-08-22 ENCOUNTER — Other Ambulatory Visit: Payer: Self-pay | Admitting: Radiation Oncology

## 2020-08-22 ENCOUNTER — Ambulatory Visit
Admission: RE | Admit: 2020-08-22 | Discharge: 2020-08-22 | Disposition: A | Discharge status: Home / Self Care | Payer: 59 | Source: Ambulatory Visit | Attending: Radiation Oncology | Admitting: Radiation Oncology

## 2020-08-22 ENCOUNTER — Other Ambulatory Visit: Payer: Self-pay

## 2020-08-22 DIAGNOSIS — R300 Dysuria: Secondary | ICD-10-CM | POA: Diagnosis not present

## 2020-08-22 DIAGNOSIS — C519 Malignant neoplasm of vulva, unspecified: Secondary | ICD-10-CM

## 2020-08-22 MED ORDER — IBUPROFEN 600 MG PO TABS: 600.0000 mg | ORAL_TABLET | Freq: Four times a day (QID) | ORAL | 0 refills | Status: DC | PRN

## 2020-08-22 MED ORDER — LIDOCAINE 5 % EX OINT: 1.0000 "application " | TOPICAL_OINTMENT | Freq: Four times a day (QID) | CUTANEOUS | 0 refills | Status: DC | PRN

## 2020-08-23 ENCOUNTER — Ambulatory Visit
Admission: RE | Admit: 2020-08-23 | Discharge: 2020-08-23 | Disposition: A | Discharge status: Home / Self Care | Payer: 59 | Source: Ambulatory Visit | Attending: Radiation Oncology | Admitting: Radiation Oncology

## 2020-08-23 ENCOUNTER — Ambulatory Visit: Payer: 59 | Attending: Gynecologic Oncology

## 2020-08-23 DIAGNOSIS — M6281 Muscle weakness (generalized): Secondary | ICD-10-CM

## 2020-08-23 DIAGNOSIS — R600 Localized edema: Secondary | ICD-10-CM | POA: Diagnosis not present

## 2020-08-23 DIAGNOSIS — M25661 Stiffness of right knee, not elsewhere classified: Secondary | ICD-10-CM | POA: Insufficient documentation

## 2020-08-23 DIAGNOSIS — R262 Difficulty in walking, not elsewhere classified: Secondary | ICD-10-CM | POA: Insufficient documentation

## 2020-08-23 DIAGNOSIS — Z483 Aftercare following surgery for neoplasm: Secondary | ICD-10-CM | POA: Diagnosis present

## 2020-08-23 DIAGNOSIS — G8929 Other chronic pain: Secondary | ICD-10-CM | POA: Diagnosis present

## 2020-08-23 DIAGNOSIS — M25561 Pain in right knee: Secondary | ICD-10-CM | POA: Insufficient documentation

## 2020-08-23 DIAGNOSIS — R300 Dysuria: Secondary | ICD-10-CM | POA: Diagnosis not present

## 2020-08-23 NOTE — Therapy (Signed)
[place], [place], [address] Phone: [phone]   Fax:  [phone]  Physical Therapy Treatment  Patient Details  Name: Christina [name] MRN: [id number] Date of Birth: [birth year] Referring Provider (PT): [name]   Encounter Date: 08/23/2020   PT End of Session - 08/23/20 1214     Visit Number 3    Number of Visits 9    Date for PT Re-Evaluation 09/13/20    PT Start Time 1103    PT Stop Time 1155    PT Time Calculation (min) 52 min    Activity Tolerance Patient tolerated treatment well    Behavior During Therapy [facility] for tasks assessed/performed             Past Medical History:  Diagnosis Date   Arthritis    Chicken pox    Chronic back pain    HLD (hyperlipidemia)    diet controlled   Hypertension    Recurrent upper respiratory infection (URI)    Seasonal allergies    Vulvar cancer ([place])     Past Surgical History:  Procedure Laterality Date   BLADDER SUSPENSION     CHOLECYSTECTOMY     COLONOSCOPY     INGUINAL LYMPHADENECTOMY Bilateral 07/11/2020   Procedure: INGUINAL LYMPHADENECTOMY;  Surgeon: Everitt Amber, MD;  [facility];  Service: Gynecology;  Laterality: Bilateral;   TOTAL KNEE ARTHROPLASTY Right 04/25/2020   Procedure: RIGHT TOTAL KNEE ARTHROPLASTY;  Surgeon: Meredith Pel, MD;  Location: [place];  Service: Orthopedics;  Laterality: Right;   WISDOM TOOTH EXTRACTION      There were no vitals filed for this visit.   Subjective Assessment - 08/23/20 1102     Subjective had radiation today and I am a little uncomfortable.  I need to go home and use the sitz bath. Pt purchased compression shorts but [place] The [place] don't seem to change the swelling.  In the am it seems smaller and less firm and during the day it gets firmer. medial thigh pain and left calf pain seems better .    Pertinent History hx of vulvar cancer  and underwent bilateral inguinal lymphadenectomy 07/11/20, developed cellulitis 07/18/20, currently undergoing radiation, 04/25/20- R TKA, pt got an ablation in Jan 2022 has chronic back pain, L4/L5 are compressed Pt has had problems with incontinece in the past and has pelvic PT and surgery for that    Patient Stated Goals to be out of pain    Currently in Pain? Yes    Pain Score 3     Pain Location --   medial thighs   Pain Orientation Right;Left    Pain Descriptors / Indicators Aching    Pain Type Acute pain    Pain Onset More than a month ago    Pain Frequency Intermittent                               [facility] Adult PT Treatment/Exercise - 08/23/20 0001       Manual Therapy   Manual therapy comments pt measured for bilateral thigh high exostrong garments size Small Avg length    Edema Management instructed pt in short neck, and LN activation    Manual Lymphatic Drainage (MLD) short neck, superficial and deep abdmonials with diaphragmatic breathing left axillary nodes, left inguinal-axillary anastamosis. left lateral hip and thigh, medial  hip to knee and return along pathways                         PT Long Term Goals - 08/16/20 1501       PT LONG TERM GOAL #1   Title Pt will be independent with self MLD for long term management of edema.    Time 4    Period Weeks    Status New    Target Date 09/13/20      PT LONG TERM GOAL #2   Title Pt will report a 50% improvement in swelling surrounding left groin scar to allow improved comfort    Time 4    Period Weeks    Status New    Target Date 09/13/20      PT LONG TERM GOAL #3   Title Pt will be independent in Strength ABC program to begin strengthening and stretching once she is cleared by her surgeon.    Time 4    Period Weeks    Status New    Target Date 09/13/20      PT LONG TERM GOAL #4   Title Pt will obtain appropriate compression garments for long term management of edema.    Time 4     Period Weeks    Status New    Target Date 09/13/20                   Plan - 08/23/20 1215     Clinical Impression Statement Pt came in with concrns about right ankle swelling.  In conversation with Hilaria Ota we decided to have her order thigh high exostrong garments.  She was measured into a small average and she used her credit card to purchase.  Continued MLD to left LE thigh area and had pt practice the beginning parts of MLD ie short neck, Left axillary and inguinal LN's.  Pt was not feeling well as she had just completed radiation appt and had to rush to the bathroom during rx.  She is going home to do her sitz bath.  she was advised to elevate her legs.    Personal Factors and Comorbidities Comorbidity 1    Comorbidities hx of chronic back pain with compressed discs    Examination-Activity Limitations Locomotion Level    Stability/Clinical Decision Making Evolving/Moderate complexity    Rehab Potential Good    PT Frequency 2x / week    PT Duration 4 weeks    PT Treatment/Interventions ADLs/Self Care Home Management;Therapeutic exercise;Therapeutic activities;Patient/family education;Orthotic Fit/Training;Manual techniques;Manual lymph drainage;Compression bandaging;Scar mobilization;Taping;Vasopneumatic Device    PT Next Visit Plan cont  MLD to LLE and instruct pt with cues on technique consider soft tissue mobilization over left groin scar,monitoring sking for radiation changes see if pt got compression shorts. MLD to Right LE    Recommended Other Services Exostrong thigh high stockings Small Average ordered by pt.    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   Cellulitis, wound, post-operative 07/18/2020   Vulvar cancer ([place]) 07/11/2020   Vulva cancer ([place]) 07/11/2020   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   Arthritis of right knee    S/P total knee arthroplasty, 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   Degenerative arthritis of knee, bilateral 12/31/2019   Patellofemoral arthritis of left knee 03/10/2019   Acute medial meniscus tear of left knee 11/19/2018   Sprain of medial collateral ligament of left knee 09/01/2018   Neck strain, initial encounter 08/28/2018   Degenerative disc disease, cervical 08/28/2018   Left lumbar radiculopathy 03/05/2018   Right lateral epicondylitis 10/30/2017   SI (sacroiliac) joint dysfunction 10/30/2017   Nonallopathic lesion of sacral region 10/30/2017   Nonallopathic lesion of lumbar region 10/30/2017   Nonallopathic lesion of thoracic region 10/30/2017   History of recurrent UTIs 04/11/2017   Hypertension, essential, benign 10/29/2016   Allergic rhinitis 10/29/2016    Christina [name] 08/23/2020, 12:20 PM  [place], [place], [address] Phone: [phone]   Fax:  [phone]  Name: Christina [name] MRN: [id number] Date of Birth: Jun 30, 1960  Cheral Almas, PT 08/23/20 12:22 PM

## 2020-08-24 ENCOUNTER — Ambulatory Visit
Admission: RE | Admit: 2020-08-24 | Discharge: 2020-08-24 | Disposition: A | Discharge status: Home / Self Care | Payer: 59 | Source: Ambulatory Visit | Attending: Radiation Oncology | Admitting: Radiation Oncology

## 2020-08-24 DIAGNOSIS — R300 Dysuria: Secondary | ICD-10-CM | POA: Diagnosis not present

## 2020-08-25 ENCOUNTER — Encounter: Payer: Self-pay | Admitting: Physical Therapy

## 2020-08-25 ENCOUNTER — Ambulatory Visit
Admission: RE | Admit: 2020-08-25 | Discharge: 2020-08-25 | Disposition: A | Discharge status: Home / Self Care | Payer: 59 | Source: Ambulatory Visit | Attending: Radiation Oncology | Admitting: Radiation Oncology

## 2020-08-25 ENCOUNTER — Ambulatory Visit: Payer: 59 | Admitting: Physical Therapy

## 2020-08-25 ENCOUNTER — Other Ambulatory Visit: Payer: Self-pay

## 2020-08-25 DIAGNOSIS — R262 Difficulty in walking, not elsewhere classified: Secondary | ICD-10-CM

## 2020-08-25 DIAGNOSIS — R300 Dysuria: Secondary | ICD-10-CM | POA: Diagnosis not present

## 2020-08-25 DIAGNOSIS — Z483 Aftercare following surgery for neoplasm: Secondary | ICD-10-CM

## 2020-08-25 DIAGNOSIS — R600 Localized edema: Secondary | ICD-10-CM | POA: Diagnosis not present

## 2020-08-25 DIAGNOSIS — M6281 Muscle weakness (generalized): Secondary | ICD-10-CM

## 2020-08-25 NOTE — Therapy (Signed)
[place], [place], [address] Phone: [phone]   Fax:  [phone]  Physical Therapy Treatment  Patient Details  Name: Christina [name] MRN: [id number] Date of Birth: October 19, 1960 Referring Provider (PT): [name]   Encounter Date: 08/25/2020   PT End of Session - 08/25/20 1309     Visit Number 4    Number of Visits 9    Date for PT Re-Evaluation 09/13/20    PT Start Time 0900    PT Stop Time 6160    PT Time Calculation (min) 55 min    Activity Tolerance Patient tolerated treatment well             Past Medical History:  Diagnosis Date   Arthritis    Chicken pox    Chronic back pain    HLD (hyperlipidemia)    diet controlled   Hypertension    Recurrent upper respiratory infection (URI)    Seasonal allergies    Vulvar cancer ([place])     Past Surgical History:  Procedure Laterality Date   BLADDER SUSPENSION     CHOLECYSTECTOMY     COLONOSCOPY     INGUINAL LYMPHADENECTOMY Bilateral 07/11/2020   Procedure: INGUINAL LYMPHADENECTOMY;  Surgeon: Everitt Amber, MD;  [facility];  Service: Gynecology;  Laterality: Bilateral;   TOTAL KNEE ARTHROPLASTY Right 04/25/2020   Procedure: RIGHT TOTAL KNEE ARTHROPLASTY;  Surgeon: Meredith Pel, MD;  Location: [place];  Service: Orthopedics;  Laterality: Right;   WISDOM TOOTH EXTRACTION      There were no vitals filed for this visit.   Subjective Assessment - 08/25/20 1306     Subjective The radiation fatigue is getting to me.    Pertinent History hx of vulvar cancer and underwent bilateral inguinal lymphadenectomy 07/11/20, developed cellulitis 07/18/20, currently undergoing radiation, 04/25/20- R TKA, pt got an ablation in Jan 2022 has chronic back pain, L4/L5 are compressed Pt has had problems with incontinece in the past and has pelvic PT and surgery for that    Patient Stated Goals to be out of pain    Currently in Pain? No/denies                                [facility] Adult PT Treatment/Exercise - 08/25/20 0001       Manual Therapy   Manual Therapy Manual Lymphatic Drainage (MLD)    Edema Management Pt got a pair of spank shorts that she says are comfortable and offering compression without digging into inguinal crease    Manual Lymphatic Drainage (MLD) in supine with legs elevated: short neck, superficial and deep abdmonials with diaphragmatic breathing left axillary nodes with extra work on abdominals, left inguinal-axillary anastamosis. left lateral hip and thigh, medial hip to knee and return along pathways the to right leg for thigh, leg, foot and ankle with return along pathways                         PT Long Term Goals - 08/16/20 1501       PT LONG TERM GOAL #1   Title Pt will be independent with self MLD for long term management of edema.    Time 4    Period Weeks    Status New    Target Date 09/13/20      PT LONG TERM GOAL #2   Title Pt will  report a 50% improvement in swelling surrounding left groin scar to allow improved comfort    Time 4    Period Weeks    Status New    Target Date 09/13/20      PT LONG TERM GOAL #3   Title Pt will be independent in Strength ABC program to begin strengthening and stretching once she is cleared by her surgeon.    Time 4    Period Weeks    Status New    Target Date 09/13/20      PT LONG TERM GOAL #4   Title Pt will obtain appropriate compression garments for long term management of edema.    Time 4    Period Weeks    Status New    Target Date 09/13/20                   Plan - 08/25/20 1310     Clinical Impression Statement Pt does not seem to have much swelling in her right leg today and has been doing some self MLD to this area. Her left groin incisional full area seems to be softer today, but pt feels it is about the same.She finds the compression shorts to be comfortable  She also has left congestion in thighs than last  visit with this PT.    Personal Factors and Comorbidities Comorbidity 1    Comorbidities hx of chronic back pain with compressed discs    Stability/Clinical Decision Making Evolving/Moderate complexity    Rehab Potential Good    PT Frequency 2x / week    PT Duration 4 weeks    PT Treatment/Interventions ADLs/Self Care Home Management;Therapeutic exercise;Therapeutic activities;Patient/family education;Orthotic Fit/Training;Manual techniques;Manual lymph drainage;Compression bandaging;Scar mobilization;Taping;Vasopneumatic Device    PT Next Visit Plan cont  MLD to LLE and instruct pt with cues on technique consider soft tissue mobilization over left groin scar,monitoring sking for radiation changes  MLD to Right LE    Consulted and Agree with Plan of Care Patient             Patient will benefit from skilled therapeutic intervention in order to improve the following deficits and impairments:  Pain, Increased edema, Decreased scar mobility, Difficulty walking  Visit Diagnosis: Localized edema  Difficulty walking  Muscle weakness (generalized)  Aftercare following surgery for neoplasm     Problem List 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   Degenerative arthritis of knee, bilateral 12/31/2019   Patellofemoral arthritis of left knee 03/10/2019   Acute medial meniscus tear of left knee 11/19/2018   Sprain of medial collateral ligament of left knee 09/01/2018   Neck strain, initial encounter 08/28/2018   Degenerative disc disease, cervical 08/28/2018   Left lumbar radiculopathy 03/05/2018   Right lateral epicondylitis 10/30/2017   SI (sacroiliac) joint dysfunction 10/30/2017   Nonallopathic  lesion of sacral region 10/30/2017   Nonallopathic lesion of lumbar region 10/30/2017   Nonallopathic lesion of thoracic region 10/30/2017   History of recurrent UTIs 04/11/2017   Hypertension, essential, benign 10/29/2016   Allergic rhinitis 10/29/2016   Donato Heinz. Owens Shark, PT  Norwood Levo 08/25/2020, 1:13 PM  [place], [place],  [address] Phone: [phone]   Fax:  [phone]  Name: Christina [name] MRN: [id number] Date of Birth: [birth year]

## 2020-08-27 ENCOUNTER — Encounter (HOSPITAL_BASED_OUTPATIENT_CLINIC_OR_DEPARTMENT_OTHER): Payer: Self-pay | Admitting: Emergency Medicine

## 2020-08-27 ENCOUNTER — Emergency Department (HOSPITAL_BASED_OUTPATIENT_CLINIC_OR_DEPARTMENT_OTHER)
Admission: EM | Admit: 2020-08-27 | Discharge: 2020-08-27 | Disposition: A | Discharge status: Home / Self Care | Payer: 59 | Attending: Emergency Medicine | Admitting: Emergency Medicine

## 2020-08-27 ENCOUNTER — Other Ambulatory Visit: Payer: Self-pay

## 2020-08-27 DIAGNOSIS — Z87891 Personal history of nicotine dependence: Secondary | ICD-10-CM | POA: Diagnosis not present

## 2020-08-27 DIAGNOSIS — Z8544 Personal history of malignant neoplasm of other female genital organs: Secondary | ICD-10-CM | POA: Diagnosis not present

## 2020-08-27 DIAGNOSIS — I10 Essential (primary) hypertension: Secondary | ICD-10-CM | POA: Diagnosis not present

## 2020-08-27 DIAGNOSIS — Z96651 Presence of right artificial knee joint: Secondary | ICD-10-CM | POA: Diagnosis not present

## 2020-08-27 DIAGNOSIS — N3000 Acute cystitis without hematuria: Secondary | ICD-10-CM

## 2020-08-27 DIAGNOSIS — R300 Dysuria: Secondary | ICD-10-CM | POA: Diagnosis present

## 2020-08-27 DIAGNOSIS — Z79899 Other long term (current) drug therapy: Secondary | ICD-10-CM | POA: Insufficient documentation

## 2020-08-27 LAB — URINALYSIS, ROUTINE W REFLEX MICROSCOPIC
Glucose, UA: NEGATIVE mg/dL
Ketones, ur: NEGATIVE mg/dL
Nitrite: POSITIVE — AB
Protein, ur: NEGATIVE mg/dL
Specific Gravity, Urine: 1.018 (ref 1.005–1.030)
pH: 5.5 (ref 5.0–8.0)

## 2020-08-27 MED ORDER — ONDANSETRON 4 MG PO TBDP: ORAL_TABLET | ORAL | 0 refills | Status: DC

## 2020-08-27 MED ORDER — CEPHALEXIN 500 MG PO CAPS: 500.0000 mg | ORAL_CAPSULE | Freq: Two times a day (BID) | ORAL | 0 refills | Status: AC

## 2020-08-27 MED ORDER — CEPHALEXIN 250 MG PO CAPS
1000.0000 mg | ORAL_CAPSULE | Freq: Once | ORAL | Status: AC
  Administered 2020-08-27: 1000 mg via ORAL
  Filled 2020-08-27: qty 4

## 2020-08-27 NOTE — ED Provider Notes (Signed)
[place] EMERGENCY DEPT Provider Note   CSN: [id number] Arrival date & time: 08/27/20  0920     History Chief Complaint  Patient presents with   Dysuria    Christina [name] is a 60 y.o. female.  60 yo F with a chief complaints of dysuria having this frequency and hesitancy.  Going on for a day or so.  Patient is getting radiation therapy for vulvar cancer.  Discussed this with her care team and was suggested to start on Azo.  Patient does have a history of frequent UTIs in the past.  Denies fevers or flank pain.  Has had 1 episode of nausea and vomiting.  Denies abdominal pain.  The history is provided by the patient.  Dysuria Associated symptoms: nausea and vomiting   Associated symptoms: no fever   Illness Severity:  Moderate Onset quality:  Gradual Duration:  2 days Timing:  Constant Progression:  Worsening Chronicity:  New Associated symptoms: nausea and vomiting   Associated symptoms: no chest pain, no congestion, no fever, no headaches, no myalgias, no rhinorrhea, no shortness of breath and no wheezing       Past Medical History:  Diagnosis Date   Arthritis    Chicken pox    Chronic back pain    HLD (hyperlipidemia)    diet controlled   Hypertension    Recurrent upper respiratory infection (URI)    Seasonal allergies    Vulvar cancer ([place])     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[place]) 07/11/2020   Vulva cancer ([place]) 07/11/2020   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   Arthritis of right knee    S/P total knee arthroplasty, 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   Degenerative arthritis of knee, bilateral 12/31/2019   Patellofemoral arthritis of left knee 03/10/2019   Acute medial meniscus tear of left knee 11/19/2018   Sprain of medial collateral ligament of left knee 09/01/2018   Neck strain, initial encounter 08/28/2018   Degenerative  disc disease, cervical 08/28/2018   Left lumbar radiculopathy 03/05/2018   Right lateral epicondylitis 10/30/2017   SI (sacroiliac) joint dysfunction 10/30/2017   Nonallopathic lesion of sacral region 10/30/2017   Nonallopathic lesion of lumbar region 10/30/2017   Nonallopathic lesion of thoracic region 10/30/2017   History of recurrent UTIs 04/11/2017   Hypertension, essential, benign 10/29/2016   Allergic rhinitis 10/29/2016    Past Surgical History:  Procedure Laterality Date   BLADDER SUSPENSION     CHOLECYSTECTOMY     COLONOSCOPY     INGUINAL LYMPHADENECTOMY Bilateral 07/11/2020   Procedure: INGUINAL LYMPHADENECTOMY;  Surgeon: Everitt Amber, MD;  [facility];  Service: Gynecology;  Laterality: Bilateral;   TOTAL KNEE ARTHROPLASTY Right 04/25/2020   Procedure: RIGHT TOTAL KNEE ARTHROPLASTY;  Surgeon: Meredith Pel, MD;  Location: [place] [facility];  Service: Orthopedics;  Laterality: Right;   WISDOM TOOTH EXTRACTION       OB History     Gravida  4   Para      Term      Preterm      AB  2   Living  4      SAB  2   IAB      Ectopic  0   Multiple      Live Births              Family History  Problem Relation  Age of Onset   Hyperlipidemia Mother    Hypertension Mother    Stroke Mother    Breast cancer Mother    Prostate cancer Father    Hypertension Maternal Grandmother    Hypertension Paternal Grandmother    Stroke Paternal Grandmother    Allergic rhinitis Neg Hx    Asthma Neg Hx    Ovarian cancer Neg Hx    Uterine cancer Neg Hx    Colon cancer Neg Hx    Pancreatic cancer Neg Hx     Social History   Tobacco Use   Smoking status: Former    Packs/day: 0.50    Years: 2.00    Pack years: 1.00    Types: Cigarettes   Smokeless tobacco: Never   Tobacco comments:    Smoke in 20's  Vaping Use   Vaping Use: Never used  Substance Use Topics   Alcohol use: Not Currently   Drug use: No    Home Medications Prior to Admission medications    Medication Sig Start Date End Date Taking? Authorizing Provider  cephALEXin (KEFLEX) 500 MG capsule Take 1 capsule (500 mg total) by mouth 2 (two) times daily for 7 days. 08/27/20 09/03/20 Yes Deno Etienne, DO  cetirizine (ZYRTEC) 10 MG tablet TAKE 1 TO 2 TABLETS BY MOUTH DAILY AS NEEDED Patient taking differently: Take 10 mg by mouth daily. 03/30/20  Yes Valentina Shaggy, MD  Cholecalciferol (VITAMIN D) 50 MCG (2000 UT) tablet Take 2,000 Units by mouth daily.   Yes [provider]  ibuprofen (ADVIL) 600 MG tablet Take 1 tablet (600 mg total) by mouth every 6 (six) hours as needed for moderate pain. 08/22/20  Yes Gery Pray, MD  ipratropium (ATROVENT) 0.06 % nasal spray USE 2 SPRAYS IN [facility] NOSTRIL THREE TIMES DAILY 08/04/20  Yes Valentina Shaggy, MD  losartan (COZAAR) 50 MG tablet TAKE 1 TABLET(50 MG) BY MOUTH DAILY 06/16/20  Yes Panosh, Standley Brooking, MD  ondansetron (ZOFRAN ODT) 4 MG disintegrating tablet 4mg  ODT q4 hours prn nausea/vomit 08/27/20  Yes Deno Etienne, DO  oxyCODONE (OXY IR/ROXICODONE) 5 MG immediate release tablet Take 1 tablet (5 mg total) by mouth every 4 (four) hours as needed for severe pain. For AFTER surgery, do not take and drive 6/57/84  Yes Eppie Gibson, MD  Probiotic Product (PROBIOTIC PO) Take 1 tablet by mouth daily.   Yes [provider]  acetaminophen (TYLENOL) 325 MG tablet Take 650 mg by mouth every 6 (six) hours as needed for moderate pain. Patient not taking: No sig reported    [provider]  diphenhydramine-acetaminophen (TYLENOL PM) 25-500 MG TABS tablet Take 2 tablets by mouth at bedtime. Patient not taking: No sig reported    [provider]  levocetirizine (XYZAL) 5 MG tablet Take 1 tablet (5 mg total) by mouth every evening. 04/02/19   Valentina Shaggy, MD  lidocaine (XYLOCAINE) 2 % solution Apply small amount over sore lesion up to every 4 hours as needed for pain 08/17/20   Gery Pray, MD  lidocaine (XYLOCAINE) 5 %  ointment Apply 1 application topically 4 (four) times daily as needed for moderate pain. 08/22/20   Gery Pray, MD  LORazepam (ATIVAN) 0.5 MG tablet Take 1 tablet (0.5 mg total) by mouth every 8 (eight) hours. As needed for nausea or anxiety 07/03/20   Gery Pray, MD  oxyCODONE-acetaminophen (PERCOCET/ROXICET) 5-325 MG tablet 1 po at bedtime as needed Patient not taking: No sig reported 06/14/20   Marlou Sa,  Tonna Corner, MD  senna-docusate (SENOKOT-S) 8.6-50 MG tablet Take 2 tablets by mouth at bedtime. For AFTER surgery, do not take if having diarrhea Patient not taking: No sig reported 07/07/20   [name] D, NP    Allergies    Patient has no known allergies.  Review of Systems   Review of Systems  Constitutional:  Negative for chills and fever.  HENT:  Negative for congestion and rhinorrhea.   Eyes:  Negative for redness and visual disturbance.  Respiratory:  Negative for shortness of breath and wheezing.   Cardiovascular:  Negative for chest pain and palpitations.  Gastrointestinal:  Positive for nausea and vomiting.  Genitourinary:  Positive for dysuria. Negative for urgency.  Musculoskeletal:  Negative for arthralgias and myalgias.  Skin:  Negative for pallor and wound.  Neurological:  Negative for dizziness and headaches.   Physical Exam Updated Vital Signs BP 140/85   Pulse 82   Temp 97.8 F (36.6 C)   Resp 20   SpO2 100%   Physical Exam Vitals and nursing note reviewed.  Constitutional:      General: She is not in acute distress.    Appearance: She is well-developed. She is not diaphoretic.  HENT:     Head: Normocephalic and atraumatic.  Eyes:     Pupils: Pupils are equal, round, and reactive to light.  Cardiovascular:     Rate and Rhythm: Normal rate and regular rhythm.     Heart sounds: No murmur heard.   No friction rub. No gallop.  Pulmonary:     Effort: Pulmonary effort is normal.     Breath sounds: No wheezing or rales.  Abdominal:     General:  There is no distension.     Palpations: Abdomen is soft.     Tenderness: There is no abdominal tenderness.  Musculoskeletal:        General: No tenderness.     Cervical back: Normal range of motion and neck supple.  Skin:    General: Skin is warm and dry.  Neurological:     Mental Status: She is alert and oriented to person, place, and time.  Psychiatric:        Behavior: Behavior normal.    ED Results / Procedures / Treatments   Labs (all labs ordered are listed, but only abnormal results are displayed) Labs Reviewed  URINALYSIS, ROUTINE W REFLEX MICROSCOPIC - Abnormal; Notable for the following components:      Result Value   Color, Urine BROWN (*)    Hgb urine dipstick TRACE (*)    Bilirubin Urine SMALL (*)    Nitrite POSITIVE (*)    Leukocytes,Ua SMALL (*)    Bacteria, UA RARE (*)    All other components within normal limits    EKG None  Radiology No results found.  Procedures Procedures   Medications Ordered in ED Medications  cephALEXin (KEFLEX) capsule 1,000 mg (has no administration in time range)    ED Course  I have reviewed the triage vital signs and the nursing notes.  Pertinent labs & imaging results that were available during my care of the patient were reviewed by me and considered in my medical decision making (see chart for details).    MDM Rules/Calculators/A&P                          60 yo F with a chief complaints of dysuria increased frequency and hesitancy.  Going on for  a couple days.  Urine is nitrite positive.  Will start on antibiotics.  PCP follow-up.  10:13 AM:  I have discussed the diagnosis/risks/treatment options with the patient and believe the pt to be eligible for discharge home to follow-up with PCP. We also discussed returning to the ED immediately if new or worsening sx occur. We discussed the sx which are most concerning (e.g., sudden worsening pain, fever, inability to tolerate by mouth) that necessitate immediate return.  Medications administered to the patient during their visit and any new prescriptions provided to the patient are listed below.  Medications given during this visit Medications  cephALEXin (KEFLEX) capsule 1,000 mg (has no administration in time range)     The patient appears reasonably screen and/or stabilized for discharge and I doubt any other medical condition or other [facility] requiring further screening, evaluation, or treatment in the ED at this time prior to discharge.   Final Clinical Impression(s) / ED Diagnoses Final diagnoses:  Acute cystitis without hematuria    Rx / DC Orders ED Discharge Orders          Ordered    cephALEXin (KEFLEX) 500 MG capsule  2 times daily        08/27/20 1007    ondansetron (ZOFRAN ODT) 4 MG disintegrating tablet        08/27/20 1007             Vincent, DO 08/27/20 1013

## 2020-08-27 NOTE — ED Triage Notes (Signed)
Pt also reports some chronic diarrhea from radiation for which she is taking immodium.

## 2020-08-27 NOTE — Discharge Instructions (Addendum)
Your urine was nitrite positive which usually indicates that you have a E. coli urinary tract infection.  I am going to start you on an antibiotic that would cover for the kidneys as well.  I prescribed you nausea medicine in case you have continued issues with nausea.  Please return for fevers or pain in your side.  Follow-up with your family doctor.

## 2020-08-27 NOTE — ED Triage Notes (Signed)
Pt started with painful urination on Friday. Denies n/v. Pt is taking AZO. Pt also receiving radiation therapy for vulva cancer.

## 2020-08-28 ENCOUNTER — Other Ambulatory Visit: Payer: Self-pay | Admitting: Radiation Oncology

## 2020-08-28 ENCOUNTER — Ambulatory Visit
Admission: RE | Admit: 2020-08-28 | Discharge: 2020-08-28 | Disposition: A | Discharge status: Home / Self Care | Payer: 59 | Source: Ambulatory Visit | Attending: Radiation Oncology | Admitting: Radiation Oncology

## 2020-08-28 DIAGNOSIS — R300 Dysuria: Secondary | ICD-10-CM | POA: Diagnosis not present

## 2020-08-28 DIAGNOSIS — C519 Malignant neoplasm of vulva, unspecified: Secondary | ICD-10-CM

## 2020-08-28 MED ORDER — IBUPROFEN 600 MG PO TABS
600.0000 mg | ORAL_TABLET | Freq: Four times a day (QID) | ORAL | 0 refills | Status: DC | PRN
Start: 2020-08-28 — End: 2020-10-03

## 2020-08-29 ENCOUNTER — Other Ambulatory Visit: Payer: Self-pay | Admitting: Radiation Oncology

## 2020-08-29 ENCOUNTER — Ambulatory Visit: Payer: 59 | Admitting: Physical Therapy

## 2020-08-29 ENCOUNTER — Ambulatory Visit
Admission: RE | Admit: 2020-08-29 | Discharge: 2020-08-29 | Disposition: A | Discharge status: Home / Self Care | Payer: 59 | Source: Ambulatory Visit | Attending: Radiation Oncology | Admitting: Radiation Oncology

## 2020-08-29 ENCOUNTER — Other Ambulatory Visit: Payer: Self-pay

## 2020-08-29 DIAGNOSIS — R600 Localized edema: Secondary | ICD-10-CM | POA: Diagnosis not present

## 2020-08-29 DIAGNOSIS — Z483 Aftercare following surgery for neoplasm: Secondary | ICD-10-CM

## 2020-08-29 DIAGNOSIS — M6281 Muscle weakness (generalized): Secondary | ICD-10-CM

## 2020-08-29 DIAGNOSIS — C519 Malignant neoplasm of vulva, unspecified: Secondary | ICD-10-CM

## 2020-08-29 DIAGNOSIS — R300 Dysuria: Secondary | ICD-10-CM | POA: Diagnosis not present

## 2020-08-29 DIAGNOSIS — R262 Difficulty in walking, not elsewhere classified: Secondary | ICD-10-CM

## 2020-08-29 MED ORDER — XTAMPZA ER 9 MG PO C12A: 9.0000 mg | EXTENDED_RELEASE_CAPSULE | Freq: Two times a day (BID) | ORAL | 0 refills | Status: DC

## 2020-08-29 MED ORDER — SILVER SULFADIAZINE 1 % EX CREA
1.0000 "application " | TOPICAL_CREAM | Freq: Once | CUTANEOUS | Status: AC
  Administered 2020-08-29: 1 via TOPICAL

## 2020-08-29 NOTE — Therapy (Signed)
[place], [place], [address] Phone: [phone]   Fax:  [phone]  Physical Therapy Treatment  Patient Details  Name: Christina [name] MRN: [id number] Date of Birth: 08/01/1960 Referring Provider (PT): [name]   Encounter Date: 08/29/2020   PT End of Session - 08/29/20 0955     Visit Number 5    Number of Visits 9    Date for PT Re-Evaluation 09/13/20    PT Start Time 0905    PT Stop Time 0955    PT Time Calculation (min) 50 min    Activity Tolerance Patient tolerated treatment well    Behavior During Therapy [facility] for tasks assessed/performed             Past Medical History:  Diagnosis Date   Arthritis    Chicken pox    Chronic back pain    HLD (hyperlipidemia)    diet controlled   Hypertension    Recurrent upper respiratory infection (URI)    Seasonal allergies    Vulvar cancer ([place])     Past Surgical History:  Procedure Laterality Date   BLADDER SUSPENSION     CHOLECYSTECTOMY     COLONOSCOPY     INGUINAL LYMPHADENECTOMY Bilateral 07/11/2020   Procedure: INGUINAL LYMPHADENECTOMY;  Surgeon: Everitt Amber, MD;  Location: [facility];  Service: Gynecology;  Laterality: Bilateral;   TOTAL KNEE ARTHROPLASTY Right 04/25/2020   Procedure: RIGHT TOTAL KNEE ARTHROPLASTY;  Surgeon: Meredith Pel, MD;  Location: [place];  Service: Orthopedics;  Laterality: Right;   WISDOM TOOTH EXTRACTION      There were no vitals filed for this visit.   Subjective Assessment - 08/29/20 0959     Subjective The radiation is really starting to effect my digestive system. I am having more swelling on the R LE than the L  LE.    Pertinent History hx of vulvar cancer and underwent bilateral inguinal lymphadenectomy 07/11/20, developed cellulitis 07/18/20, currently undergoing radiation, 04/25/20- R TKA, pt got an ablation in Jan 2022 has chronic back pain, L4/L5 are compressed Pt has had problems with incontinece in  the past and has pelvic PT and surgery for that    Patient Stated Goals to be out of pain    Currently in Pain? No/denies    Pain Score 0-No pain                               [facility] Adult PT Treatment/Exercise - 08/29/20 0001       Manual Therapy   Manual Lymphatic Drainage (MLD) in supine with legs elevated: short neck, superficial and deep abdmonials with diaphragmatic breathing left axillary nodes with extra work on abdominals, left inguinal-axillary anastamosis. left lateral hip and thigh, medial hip to knee and return along pathways the to right leg for thigh, leg, foot and ankle with return along pathways                         PT Long Term Goals - 08/16/20 1501       PT LONG TERM GOAL #1   Title Pt will be independent with self MLD for long term management of edema.    Time 4    Period Weeks    Status New    Target Date 09/13/20      PT LONG TERM GOAL #2   Title Pt will  report a 50% improvement in swelling surrounding left groin scar to allow improved comfort    Time 4    Period Weeks    Status New    Target Date 09/13/20      PT LONG TERM GOAL #3   Title Pt will be independent in Strength ABC program to begin strengthening and stretching once she is cleared by her surgeon.    Time 4    Period Weeks    Status New    Target Date 09/13/20      PT LONG TERM GOAL #4   Title Pt will obtain appropriate compression garments for long term management of edema.    Time 4    Period Weeks    Status New    Target Date 09/13/20                   Plan - 08/29/20 0956     Clinical Impression Statement Pt's swelling continues to be managed in her LLE. Her RLE has more edema than her left. Pt has been wearing her compression garments on her RLE and going without it on the L since the left is not swollen. Educated pt to wear it on the left if she begins to notice any increase in edema. Continued MLD to bilateral LEs with some extra  time spent on RLE.    PT Frequency 2x / week    PT Duration 4 weeks    PT Treatment/Interventions ADLs/Self Care Home Management;Therapeutic exercise;Therapeutic activities;Patient/family education;Orthotic Fit/Training;Manual techniques;Manual lymph drainage;Compression bandaging;Scar mobilization;Taping;Vasopneumatic Device    PT Next Visit Plan cont  MLD to LLE and instruct pt with cues on technique consider soft tissue mobilization over left groin scar,monitoring skin for radiation changes  MLD to Right LE    Consulted and Agree with Plan of Care Patient             Patient will benefit from skilled therapeutic intervention in order to improve the following deficits and impairments:  Pain, Increased edema, Decreased scar mobility, Difficulty walking  Visit Diagnosis: Localized edema  Difficulty walking  Muscle weakness (generalized)  Aftercare following surgery for neoplasm     Problem List 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[name]) 07/11/2020   Vulva cancer ([place]) 07/11/2020   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   Arthritis of right knee    S/P total knee arthroplasty, 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   Degenerative arthritis of knee, bilateral 12/31/2019   Patellofemoral arthritis of left knee 03/10/2019   Acute medial meniscus tear of left knee 11/19/2018   Sprain of medial collateral ligament of left knee 09/01/2018   Neck strain, initial encounter 08/28/2018   Degenerative disc disease, cervical 08/28/2018   Left lumbar radiculopathy 03/05/2018   Right lateral epicondylitis 10/30/2017   SI (sacroiliac) joint dysfunction 10/30/2017   Nonallopathic lesion of sacral region 10/30/2017   Nonallopathic lesion of lumbar region 10/30/2017   Nonallopathic lesion of thoracic region 10/30/2017   History of recurrent UTIs 04/11/2017   Hypertension, essential,  benign 10/29/2016   Allergic rhinitis 10/29/2016    Allyson Sabal [facility] 08/29/2020, 10:00 AM  [place], [place], [address] Phone: [phone]   Fax:  [phone]  Name: Christina [name] MRN: [id number] Date of Birth: February 26, 1960   Manus Gunning, PT 08/29/20 10:00 AM

## 2020-08-30 ENCOUNTER — Ambulatory Visit
Admission: RE | Admit: 2020-08-30 | Discharge: 2020-08-30 | Disposition: A | Discharge status: Home / Self Care | Payer: 59 | Source: Ambulatory Visit | Attending: Radiation Oncology | Admitting: Radiation Oncology

## 2020-08-30 ENCOUNTER — Other Ambulatory Visit: Payer: Self-pay | Admitting: Radiology

## 2020-08-30 DIAGNOSIS — C519 Malignant neoplasm of vulva, unspecified: Secondary | ICD-10-CM

## 2020-08-30 DIAGNOSIS — R300 Dysuria: Secondary | ICD-10-CM | POA: Diagnosis not present

## 2020-08-31 ENCOUNTER — Other Ambulatory Visit: Payer: Self-pay

## 2020-08-31 ENCOUNTER — Telehealth: Payer: Self-pay | Admitting: Dietician

## 2020-08-31 ENCOUNTER — Ambulatory Visit
Admission: RE | Admit: 2020-08-31 | Discharge: 2020-08-31 | Disposition: A | Discharge status: Home / Self Care | Payer: 59 | Source: Ambulatory Visit | Attending: Radiation Oncology | Admitting: Radiation Oncology

## 2020-08-31 DIAGNOSIS — R300 Dysuria: Secondary | ICD-10-CM | POA: Diagnosis not present

## 2020-08-31 DIAGNOSIS — C519 Malignant neoplasm of vulva, unspecified: Secondary | ICD-10-CM

## 2020-08-31 NOTE — Telephone Encounter (Signed)
Scheduled appointment per 07/14 sch msg. Patient is aware. 

## 2020-09-01 ENCOUNTER — Ambulatory Visit: Payer: 59

## 2020-09-03 ENCOUNTER — Encounter: Payer: Self-pay | Admitting: Gynecologic Oncology

## 2020-09-04 ENCOUNTER — Other Ambulatory Visit: Payer: Self-pay

## 2020-09-04 ENCOUNTER — Ambulatory Visit
Admission: RE | Admit: 2020-09-04 | Discharge: 2020-09-04 | Disposition: A | Discharge status: Home / Self Care | Payer: 59 | Source: Ambulatory Visit | Attending: Radiation Oncology | Admitting: Radiation Oncology

## 2020-09-04 DIAGNOSIS — R300 Dysuria: Secondary | ICD-10-CM | POA: Diagnosis not present

## 2020-09-05 ENCOUNTER — Ambulatory Visit: Payer: 59 | Admitting: Physical Therapy

## 2020-09-05 ENCOUNTER — Ambulatory Visit
Admission: RE | Admit: 2020-09-05 | Discharge: 2020-09-05 | Disposition: A | Discharge status: Home / Self Care | Payer: 59 | Source: Ambulatory Visit | Attending: Radiation Oncology | Admitting: Radiation Oncology

## 2020-09-05 DIAGNOSIS — G8929 Other chronic pain: Secondary | ICD-10-CM

## 2020-09-05 DIAGNOSIS — M6281 Muscle weakness (generalized): Secondary | ICD-10-CM

## 2020-09-05 DIAGNOSIS — R600 Localized edema: Secondary | ICD-10-CM

## 2020-09-05 DIAGNOSIS — R300 Dysuria: Secondary | ICD-10-CM | POA: Diagnosis not present

## 2020-09-05 DIAGNOSIS — M25661 Stiffness of right knee, not elsewhere classified: Secondary | ICD-10-CM

## 2020-09-05 DIAGNOSIS — R262 Difficulty in walking, not elsewhere classified: Secondary | ICD-10-CM

## 2020-09-05 DIAGNOSIS — Z483 Aftercare following surgery for neoplasm: Secondary | ICD-10-CM

## 2020-09-05 NOTE — Therapy (Signed)
[place], [place], [address] Phone: [phone]   Fax:  [phone]  Physical Therapy Treatment  Patient Details  Name: Christina [name] MRN: [id number] Date of Birth: 06/26/1960 Referring Provider (PT): [name]   Encounter Date: 09/05/2020   PT End of Session - 09/05/20 1638     Visit Number 6    Date for PT Re-Evaluation 09/13/20    PT Start Time 1430    PT Stop Time 1505   pt had to get up and go to bathroom for 4 minutes during treatment   PT Time Calculation (min) 35 min             Past Medical History:  Diagnosis Date   Arthritis    Chicken pox    Chronic back pain    HLD (hyperlipidemia)    diet controlled   Hypertension    Recurrent upper respiratory infection (URI)    Seasonal allergies    Vulvar cancer ([place])     Past Surgical History:  Procedure Laterality Date   BLADDER SUSPENSION     CHOLECYSTECTOMY     COLONOSCOPY     INGUINAL LYMPHADENECTOMY Bilateral 07/11/2020   Procedure: INGUINAL LYMPHADENECTOMY;  Surgeon: Everitt Amber, MD;  [facility];  Service: Gynecology;  Laterality: Bilateral;   TOTAL KNEE ARTHROPLASTY Right 04/25/2020   Procedure: RIGHT TOTAL KNEE ARTHROPLASTY;  Surgeon: Meredith Pel, MD;  Location: [facility];  Service: Orthopedics;  Laterality: Right;   WISDOM TOOTH EXTRACTION      There were no vitals filed for this visit.   Subjective Assessment - 09/05/20 1632     Subjective Pt is having effects from radiation. She says she has to stay near a bathroom due to diarrhea. She arrives to PT 30 minutes late as she says she overslept from her nap    Pertinent History hx of vulvar cancer and underwent bilateral inguinal lymphadenectomy 07/11/20, developed cellulitis 07/18/20, currently undergoing radiation, 04/25/20- R TKA, pt got an ablation in Jan 2022 has chronic back pain, L4/L5 are compressed Pt has had problems with incontinece in the past and has  pelvic PT and surgery for that    Patient Stated Goals to be out of pain    Currently in Pain? No/denies                               [facility] Adult PT Treatment/Exercise - 09/05/20 0001       Manual Therapy   Manual Therapy Manual Lymphatic Drainage (MLD)    Manual Lymphatic Drainage (MLD) in supine with legs elevated, right axillay nodes. avoided abdomen due to diarrhea, right lateral hip medial medial to lateral thigh, medial knee, to lower leg and retrun, then same to left leg.                         PT Long Term Goals - 08/16/20 1501       PT LONG TERM GOAL #1   Title Pt will be independent with self MLD for long term management of edema.    Time 4    Period Weeks    Status New    Target Date 09/13/20      PT LONG TERM GOAL #2   Title Pt will report a 50% improvement in swelling surrounding left groin scar to allow improved comfort    Time 4  Period Weeks    Status New    Target Date 09/13/20      PT LONG TERM GOAL #3   Title Pt will be independent in Strength ABC program to begin strengthening and stretching once she is cleared by her surgeon.    Time 4    Period Weeks    Status New    Target Date 09/13/20      PT LONG TERM GOAL #4   Title Pt will obtain appropriate compression garments for long term management of edema.    Time 4    Period Weeks    Status New    Target Date 09/13/20                   Plan - 09/05/20 1639     Clinical Impression Statement Pt reports she has been wearing her compression stocking on her right leg for at least a few hours most every day. She says the swelling in her right leg is not bad today. She feels better after her MLD treatment    Personal Factors and Comorbidities Comorbidity 1    Comorbidities hx of chronic back pain with compressed discs    Examination-Activity Limitations Locomotion Level    Stability/Clinical Decision Making Evolving/Moderate complexity    Rehab  Potential Good    PT Frequency 2x / week    PT Duration 4 weeks    PT Treatment/Interventions ADLs/Self Care Home Management;Therapeutic exercise;Therapeutic activities;Patient/family education;Orthotic Fit/Training;Manual techniques;Manual lymph drainage;Compression bandaging;Scar mobilization;Taping;Vasopneumatic Device    PT Next Visit Plan cont  MLD to LLE and instruct pt with cues on technique consider soft tissue mobilization over left groin scar,monitoring skin for radiation changes  MLD to Right LE             Patient will benefit from skilled therapeutic intervention in order to improve the following deficits and impairments:  Pain, Increased edema, Decreased scar mobility, Difficulty walking  Visit Diagnosis: Localized edema  Difficulty walking  Muscle weakness (generalized)  Aftercare following surgery for neoplasm  Stiffness of right knee, not elsewhere classified  Chronic pain of right knee     Problem List 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[place]) 07/11/2020   Vulva cancer ([place]) 07/11/2020   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   Arthritis of right knee    S/P total knee arthroplasty, 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   Degenerative arthritis of knee, bilateral 12/31/2019   Patellofemoral arthritis of left knee 03/10/2019   Acute medial meniscus tear of left knee 11/19/2018   Sprain of medial collateral ligament of left knee 09/01/2018   Neck strain, initial encounter 08/28/2018   Degenerative disc disease, cervical 08/28/2018   Left lumbar radiculopathy 03/05/2018   Right lateral epicondylitis 10/30/2017   SI (sacroiliac) joint dysfunction 10/30/2017   Nonallopathic lesion of sacral region 10/30/2017   Nonallopathic lesion of lumbar region 10/30/2017   Nonallopathic lesion of thoracic region 10/30/2017   History of recurrent UTIs 04/11/2017    Hypertension, essential, benign 10/29/2016   Allergic rhinitis 10/29/2016   Donato Heinz. Owens Shark PT  Norwood Levo 09/05/2020, 4:42 PM  [place], [place], [address] Phone: [phone]   Fax:  [phone]  Name: Gabryelle Whitmoyer Woodrow MRN: [id number] Date of Birth: 03/24/1960

## 2020-09-06 ENCOUNTER — Other Ambulatory Visit: Payer: Self-pay

## 2020-09-06 ENCOUNTER — Inpatient Hospital Stay: Payer: 59 | Attending: Gynecologic Oncology | Admitting: Dietician

## 2020-09-06 ENCOUNTER — Ambulatory Visit
Admission: RE | Admit: 2020-09-06 | Discharge: 2020-09-06 | Disposition: A | Discharge status: Home / Self Care | Payer: 59 | Source: Ambulatory Visit | Attending: Radiation Oncology | Admitting: Radiation Oncology

## 2020-09-06 DIAGNOSIS — R300 Dysuria: Secondary | ICD-10-CM | POA: Diagnosis not present

## 2020-09-06 NOTE — Progress Notes (Signed)
Nutrition Assessment   Reason for Assessment: Patient request   ASSESSMENT: 60 year old female with stage III SCC vulvar cancer. She is s/p bilateral inguinofemoral lymphadenectomy on 5/24. Patient currently receiving radiation therapy. Followed by Dr. Sondra Come  Met with patient in clinic this morning. She reports having significant pain at radiation site, walking is uncomfortable. She feels best when laying down. Patient reports multiple episodes of diarrhea daily, some stools are more formed and is incontinent of bowel occasionally. Patient reports she has had 4 bowel movements already this morning. Patient reports her bottom is raw, painful, and "feels like sandpaper." She continues to use sitz bath, says she has followed dietary recommendations that were provided in radiation booklet, drinking sprite, cranberry juice and eating low fiber foods. Yesterday patient had 1/2 bagel, banana, decaf coffee, small bowl of cheerios, banana with "very little milk" and chicken pot pie for dinner. Patient reports increased fatigue, she frequently naps and looks forward to going to bed. At baseline, she is very active. Patient says she misses playing pickle ball, swimming, and is asking how soon she may be able to return to hot yoga after finishing treatment. Patient reports she has a wonderful support system at home. She is hopeful of being able to play Mahjong with her girlfriends this afternoon.   Nutrition Focused Physical Exam: deferred   Medications: oxycodone, zofran, ativan, vit D   Labs: 5/20 labs reviewed   Anthropometrics: Weight 133.8 lb on 7/12 decreased from 137 lb 3.2 oz on 6/10  Height: 5'1" Weight: 133.8 lb (7/12) UBW: 145 lb (3/03) BMI: 25.92   NUTRITION DIAGNOSIS: Unintentional weight loss related to cancer and associated treatment side effects as evidenced by diarrhea and 12 lb (8.3%) weight loss in 4.5 months. This is significant for time frame  INTERVENTION:  Discussed  strategies for diarrhea (eating small meals more frequently vs 3 meals/day, lying down after eating to slow digestion, foods to avoid, encouraged eating foods with pectin to firm up stool) - handout provided Recipes for rice porridge, electrolyte solution provided Samples of Enterade given, explained to take on empty stomach and the initial/maintenance dosages recommended by company Recommended drinking 2-3 Ensure Plus/equivalent daily as tolerated, samples of [employer], Ensure Plus, Boost Plus provided Educated on [employer] support services available (massage, reiki, support groups, pastoral, yoga, [place] chi) calendar of events provided Contact information provided   MONITORING, EVALUATION, GOAL: Patient will tolerate increased calories and protein to minimize further weight loss   Next Visit: Tuesday, August 2 in clinic

## 2020-09-07 ENCOUNTER — Telehealth: Payer: Self-pay

## 2020-09-07 ENCOUNTER — Encounter: Payer: Self-pay | Admitting: Physical Therapy

## 2020-09-07 ENCOUNTER — Ambulatory Visit
Admission: RE | Admit: 2020-09-07 | Discharge: 2020-09-07 | Disposition: A | Discharge status: Home / Self Care | Payer: 59 | Source: Ambulatory Visit | Attending: Radiation Oncology | Admitting: Radiation Oncology

## 2020-09-07 ENCOUNTER — Ambulatory Visit: Payer: 59 | Admitting: Physical Therapy

## 2020-09-07 DIAGNOSIS — M6281 Muscle weakness (generalized): Secondary | ICD-10-CM

## 2020-09-07 DIAGNOSIS — R600 Localized edema: Secondary | ICD-10-CM

## 2020-09-07 DIAGNOSIS — M25661 Stiffness of right knee, not elsewhere classified: Secondary | ICD-10-CM

## 2020-09-07 DIAGNOSIS — G8929 Other chronic pain: Secondary | ICD-10-CM

## 2020-09-07 DIAGNOSIS — Z483 Aftercare following surgery for neoplasm: Secondary | ICD-10-CM

## 2020-09-07 DIAGNOSIS — R300 Dysuria: Secondary | ICD-10-CM | POA: Diagnosis not present

## 2020-09-07 DIAGNOSIS — R262 Difficulty in walking, not elsewhere classified: Secondary | ICD-10-CM

## 2020-09-07 NOTE — Telephone Encounter (Signed)
Spoke with Christina [name] to let her know her completed approval for massage forms will be at the registration desk in the cancer center. Patient verbalizes understanding. She will pick up the forms tomorrow when she comes for radiation.

## 2020-09-07 NOTE — Therapy (Signed)
[place], [place], [address] Phone: [phone]   Fax:  [phone]  Physical Therapy Treatment  Patient Details  Name: Akasha Melena Dobler MRN: [id number] Date of Birth: 1960/04/15 Referring Provider (PT): [name]   Encounter Date: 09/07/2020   PT End of Session - 09/07/20 1610     Visit Number 7    Number of Visits 9    Date for PT Re-Evaluation 09/13/20    PT Start Time 1500    PT Stop Time 1555    PT Time Calculation (min) 55 min    Activity Tolerance Patient tolerated treatment well    Behavior During Therapy [facility] for tasks assessed/performed             Past Medical History:  Diagnosis Date   Arthritis    Chicken pox    Chronic back pain    HLD (hyperlipidemia)    diet controlled   Hypertension    Recurrent upper respiratory infection (URI)    Seasonal allergies    Vulvar cancer ([place])     Past Surgical History:  Procedure Laterality Date   BLADDER SUSPENSION     CHOLECYSTECTOMY     COLONOSCOPY     INGUINAL LYMPHADENECTOMY Bilateral 07/11/2020   Procedure: INGUINAL LYMPHADENECTOMY;  Surgeon: Everitt Amber, MD;  [facility];  Service: Gynecology;  Laterality: Bilateral;   TOTAL KNEE ARTHROPLASTY Right 04/25/2020   Procedure: RIGHT TOTAL KNEE ARTHROPLASTY;  Surgeon: Meredith Pel, MD;  Location: [facility];  Service: Orthopedics;  Laterality: Right;   WISDOM TOOTH EXTRACTION      There were no vitals filed for this visit.   Subjective Assessment - 09/07/20 1606     Subjective Pt had a very good visit with the dietician yesterday.  She has had a much better day today with her GI issues.    Pertinent History hx of vulvar cancer and underwent bilateral inguinal lymphadenectomy 07/11/20, developed cellulitis 07/18/20, currently undergoing radiation, 04/25/20- R TKA, pt got an ablation in Jan 2022 has chronic back pain, L4/L5 are compressed Pt has had problems with incontinece in the  past and has pelvic PT and surgery for that    Patient Stated Goals to be out of pain    Currently in Pain? Yes    Pain Score 2     Pain Location Perineum    Pain Orientation Mid    Pain Descriptors / Indicators Burning    Pain Type Acute pain    Aggravating Factors  standing and walking    Pain Relieving Factors feels better when lying down                               [facility] Adult PT Treatment/Exercise - 09/07/20 0001       Manual Therapy   Manual Lymphatic Drainage (MLD) in supine with legs elevated: short neck,left axillary node, left inguinal-axillary anastamosis. left lateral hip and thigh, medial hip to knee and return along pathways the to right leg for thigh, leg, foot and ankle with return along pathways then same on right side                         PT Long Term Goals - 08/16/20 1501       PT LONG TERM GOAL #1   Title Pt will be independent with self MLD for long term management  of edema.    Time 4    Period Weeks    Status New    Target Date 09/13/20      PT LONG TERM GOAL #2   Title Pt will report a 50% improvement in swelling surrounding left groin scar to allow improved comfort    Time 4    Period Weeks    Status New    Target Date 09/13/20      PT LONG TERM GOAL #3   Title Pt will be independent in Strength ABC program to begin strengthening and stretching once she is cleared by her surgeon.    Time 4    Period Weeks    Status New    Target Date 09/13/20      PT LONG TERM GOAL #4   Title Pt will obtain appropriate compression garments for long term management of edema.    Time 4    Period Weeks    Status New    Target Date 09/13/20                   Plan - 09/07/20 1611     Clinical Impression Statement Pt has some visible swelling in right LE around knee today. She reports she has not been able to do her knee or back exercises as she has been in too much discomfort. Encouraged her to do quad setting  and terminal knee extension on right knee and she feels that she will be able to do that. Pt reports she feels much better after receiving MLD    Personal Factors and Comorbidities Comorbidity 1    Comorbidities hx of chronic back pain with compressed discs    Stability/Clinical Decision Making Evolving/Moderate complexity    Rehab Potential Good    PT Frequency 2x / week    PT Duration 4 weeks    PT Treatment/Interventions ADLs/Self Care Home Management;Therapeutic exercise;Therapeutic activities;Patient/family education;Orthotic Fit/Training;Manual techniques;Manual lymph drainage;Compression bandaging;Scar mobilization;Taping;Vasopneumatic Device    PT Next Visit Plan cont  MLD to LLE and instruct pt with cues on technique consider soft tissue mobilization over left groin scar,monitoring skin for radiation changes  MLD to Right LE    Consulted and Agree with Plan of Care Patient             Patient will benefit from skilled therapeutic intervention in order to improve the following deficits and impairments:  Pain, Increased edema, Decreased scar mobility, Difficulty walking  Visit Diagnosis: Localized edema  Difficulty walking  Muscle weakness (generalized)  Aftercare following surgery for neoplasm  Stiffness of right knee, not elsewhere classified  Chronic pain of right knee     Problem List 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[place]) 07/11/2020   Vulva cancer ([place]) 07/11/2020   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   Arthritis of right knee    S/P total knee arthroplasty, 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   Degenerative arthritis of knee, bilateral 12/31/2019   Patellofemoral arthritis of left knee 03/10/2019   Acute medial meniscus tear of left knee 11/19/2018   Sprain of medial collateral ligament of left knee 09/01/2018   Neck strain, initial encounter  08/28/2018   Degenerative disc disease, cervical 08/28/2018   Left lumbar radiculopathy 03/05/2018   Right lateral epicondylitis 10/30/2017   SI (sacroiliac) joint dysfunction 10/30/2017   Nonallopathic lesion of sacral region 10/30/2017   Nonallopathic lesion of  lumbar region 10/30/2017   Nonallopathic lesion of thoracic region 10/30/2017   History of recurrent UTIs 04/11/2017   Hypertension, essential, benign 10/29/2016   Allergic rhinitis 10/29/2016   Donato Heinz. Owens Shark PT  [place] Levo 09/07/2020, [place], [place], [address] Phone: [phone]   Fax:  [phone]  Name: Jasenia Weilbacher Corning MRN: [id number] Date of Birth: 1960-10-26

## 2020-09-08 ENCOUNTER — Other Ambulatory Visit: Payer: Self-pay

## 2020-09-08 ENCOUNTER — Ambulatory Visit
Admission: RE | Admit: 2020-09-08 | Discharge: 2020-09-08 | Disposition: A | Discharge status: Home / Self Care | Payer: 59 | Source: Ambulatory Visit | Attending: Radiation Oncology | Admitting: Radiation Oncology

## 2020-09-08 ENCOUNTER — Encounter: Payer: 59 | Admitting: Physical Therapy

## 2020-09-08 DIAGNOSIS — R300 Dysuria: Secondary | ICD-10-CM | POA: Diagnosis not present

## 2020-09-11 ENCOUNTER — Telehealth: Payer: Self-pay | Admitting: Oncology

## 2020-09-11 ENCOUNTER — Ambulatory Visit
Admission: RE | Admit: 2020-09-11 | Discharge: 2020-09-11 | Disposition: A | Discharge status: Home / Self Care | Payer: 59 | Source: Ambulatory Visit

## 2020-09-11 ENCOUNTER — Ambulatory Visit
Admission: RE | Admit: 2020-09-11 | Discharge: 2020-09-11 | Disposition: A | Discharge status: Home / Self Care | Payer: 59 | Source: Ambulatory Visit | Attending: Radiation Oncology | Admitting: Radiation Oncology

## 2020-09-11 ENCOUNTER — Other Ambulatory Visit: Payer: Self-pay | Admitting: Radiation Oncology

## 2020-09-11 ENCOUNTER — Other Ambulatory Visit: Payer: Self-pay

## 2020-09-11 DIAGNOSIS — R300 Dysuria: Secondary | ICD-10-CM | POA: Diagnosis not present

## 2020-09-11 DIAGNOSIS — Z87440 Personal history of urinary (tract) infections: Secondary | ICD-10-CM

## 2020-09-11 LAB — URINALYSIS, COMPLETE (UACMP) WITH MICROSCOPIC
Bilirubin Urine: NEGATIVE
Glucose, UA: 50 mg/dL — AB
Hgb urine dipstick: NEGATIVE
Ketones, ur: NEGATIVE mg/dL
Leukocytes,Ua: NEGATIVE
Nitrite: POSITIVE — AB
Protein, ur: 100 mg/dL — AB
Specific Gravity, Urine: 1.033 — ABNORMAL HIGH (ref 1.005–1.030)
pH: 5 (ref 5.0–8.0)

## 2020-09-11 MED ORDER — PHENAZOPYRIDINE HCL 200 MG PO TABS: 200.0000 mg | ORAL_TABLET | Freq: Three times a day (TID) | ORAL | 1 refills | Status: DC | PRN

## 2020-09-11 NOTE — Telephone Encounter (Signed)
Dao called and said that her urinalysis results are in MyChart and is wondering if she needs an antibiotic.  Advised her that Dr. Sondra Come will see her tomorrow and will discuss the results and to use pyridium as needed for her symptoms.  She verbalized understanding.

## 2020-09-11 NOTE — Telephone Encounter (Signed)
Christina [name] left a message saying that she thinks she has a UTI again.  Called her back and left a message to schedule a lab for UA/culture today before or after radiation treatment.

## 2020-09-11 NOTE — Telephone Encounter (Signed)
Kayela called back and labs were scheduled for 10:00 today before radiation.

## 2020-09-12 ENCOUNTER — Other Ambulatory Visit: Payer: Self-pay

## 2020-09-12 ENCOUNTER — Emergency Department (HOSPITAL_BASED_OUTPATIENT_CLINIC_OR_DEPARTMENT_OTHER)
Admission: EM | Admit: 2020-09-12 | Discharge: 2020-09-12 | Disposition: A | Discharge status: Home / Self Care | Payer: 59 | Attending: Emergency Medicine | Admitting: Emergency Medicine

## 2020-09-12 ENCOUNTER — Encounter: Payer: 59 | Admitting: Dietician

## 2020-09-12 ENCOUNTER — Ambulatory Visit: Payer: 59 | Admitting: Physical Therapy

## 2020-09-12 ENCOUNTER — Ambulatory Visit
Admission: RE | Admit: 2020-09-12 | Discharge: 2020-09-12 | Disposition: A | Discharge status: Home / Self Care | Payer: 59 | Source: Ambulatory Visit | Attending: Radiation Oncology | Admitting: Radiation Oncology

## 2020-09-12 ENCOUNTER — Encounter (HOSPITAL_BASED_OUTPATIENT_CLINIC_OR_DEPARTMENT_OTHER): Payer: Self-pay

## 2020-09-12 DIAGNOSIS — R300 Dysuria: Secondary | ICD-10-CM | POA: Diagnosis not present

## 2020-09-12 DIAGNOSIS — Z79899 Other long term (current) drug therapy: Secondary | ICD-10-CM | POA: Diagnosis not present

## 2020-09-12 DIAGNOSIS — R600 Localized edema: Secondary | ICD-10-CM

## 2020-09-12 DIAGNOSIS — I10 Essential (primary) hypertension: Secondary | ICD-10-CM | POA: Diagnosis not present

## 2020-09-12 DIAGNOSIS — Z96651 Presence of right artificial knee joint: Secondary | ICD-10-CM | POA: Diagnosis not present

## 2020-09-12 DIAGNOSIS — R112 Nausea with vomiting, unspecified: Secondary | ICD-10-CM | POA: Diagnosis not present

## 2020-09-12 DIAGNOSIS — R197 Diarrhea, unspecified: Secondary | ICD-10-CM | POA: Diagnosis not present

## 2020-09-12 DIAGNOSIS — R102 Pelvic and perineal pain: Secondary | ICD-10-CM | POA: Insufficient documentation

## 2020-09-12 DIAGNOSIS — R350 Frequency of micturition: Secondary | ICD-10-CM | POA: Insufficient documentation

## 2020-09-12 DIAGNOSIS — Z8544 Personal history of malignant neoplasm of other female genital organs: Secondary | ICD-10-CM | POA: Insufficient documentation

## 2020-09-12 DIAGNOSIS — R262 Difficulty in walking, not elsewhere classified: Secondary | ICD-10-CM

## 2020-09-12 DIAGNOSIS — R1110 Vomiting, unspecified: Secondary | ICD-10-CM

## 2020-09-12 DIAGNOSIS — Z87891 Personal history of nicotine dependence: Secondary | ICD-10-CM | POA: Insufficient documentation

## 2020-09-12 DIAGNOSIS — M6281 Muscle weakness (generalized): Secondary | ICD-10-CM

## 2020-09-12 DIAGNOSIS — Z483 Aftercare following surgery for neoplasm: Secondary | ICD-10-CM

## 2020-09-12 LAB — URINALYSIS, ROUTINE W REFLEX MICROSCOPIC
Glucose, UA: NEGATIVE mg/dL
Hgb urine dipstick: NEGATIVE
Ketones, ur: NEGATIVE mg/dL
Leukocytes,Ua: NEGATIVE
Nitrite: POSITIVE — AB
Specific Gravity, Urine: 1.01 (ref 1.005–1.030)
pH: 6 (ref 5.0–8.0)

## 2020-09-12 LAB — CBC WITH DIFFERENTIAL/PLATELET
Abs Immature Granulocytes: 0.03 10*3/uL (ref 0.00–0.07)
Basophils Absolute: 0 10*3/uL (ref 0.0–0.1)
Basophils Relative: 1 %
Eosinophils Absolute: 0.4 10*3/uL (ref 0.0–0.5)
Eosinophils Relative: 7 %
HCT: 33.2 % — ABNORMAL LOW (ref 36.0–46.0)
Hemoglobin: 10.6 g/dL — ABNORMAL LOW (ref 12.0–15.0)
Immature Granulocytes: 1 %
Lymphocytes Relative: 5 %
Lymphs Abs: 0.3 10*3/uL — ABNORMAL LOW (ref 0.7–4.0)
MCH: 29.2 pg (ref 26.0–34.0)
MCHC: 31.9 g/dL (ref 30.0–36.0)
MCV: 91.5 fL (ref 80.0–100.0)
Monocytes Absolute: 0.7 10*3/uL (ref 0.1–1.0)
Monocytes Relative: 13 %
Neutro Abs: 4.1 10*3/uL (ref 1.7–7.7)
Neutrophils Relative %: 73 %
Platelets: 384 10*3/uL (ref 150–400)
RBC: 3.63 MIL/uL — ABNORMAL LOW (ref 3.87–5.11)
RDW: 14 % (ref 11.5–15.5)
WBC: 5.5 10*3/uL (ref 4.0–10.5)
nRBC: 0 % (ref 0.0–0.2)

## 2020-09-12 LAB — URINE CULTURE: Culture: NO GROWTH

## 2020-09-12 LAB — COMPREHENSIVE METABOLIC PANEL
ALT: 12 U/L (ref 0–44)
AST: 13 U/L — ABNORMAL LOW (ref 15–41)
Albumin: 3.9 g/dL (ref 3.5–5.0)
Alkaline Phosphatase: 43 U/L (ref 38–126)
Anion gap: 7 (ref 5–15)
BUN: 10 mg/dL (ref 8–23)
CO2: 28 mmol/L (ref 22–32)
Calcium: 9.1 mg/dL (ref 8.9–10.3)
Chloride: 103 mmol/L (ref 98–111)
Creatinine, Ser: 0.63 mg/dL (ref 0.44–1.00)
GFR, Estimated: >60 mL/min (ref >60)
Glucose, Bld: 113 mg/dL — ABNORMAL HIGH (ref 70–99)
Potassium: 3.7 mmol/L (ref 3.5–5.1)
Sodium: 138 mmol/L (ref 135–145)
Total Bilirubin: 1.3 mg/dL — ABNORMAL HIGH (ref 0.3–1.2)
Total Protein: 6.6 g/dL (ref 6.5–8.1)

## 2020-09-12 MED ORDER — ONDANSETRON HCL 4 MG/2ML IJ SOLN
4.0000 mg | Freq: Once | INTRAMUSCULAR | Status: AC
  Administered 2020-09-12: 4 mg via INTRAVENOUS
  Filled 2020-09-12: qty 2

## 2020-09-12 MED ORDER — CEPHALEXIN 500 MG PO CAPS: 500.0000 mg | ORAL_CAPSULE | Freq: Two times a day (BID) | ORAL | 0 refills | Status: AC

## 2020-09-12 MED ORDER — CEPHALEXIN 250 MG PO CAPS
500.0000 mg | ORAL_CAPSULE | Freq: Once | ORAL | Status: AC
  Administered 2020-09-12: 500 mg via ORAL
  Filled 2020-09-12: qty 2

## 2020-09-12 MED ORDER — SODIUM CHLORIDE 0.9 % IV BOLUS
1000.0000 mL | Freq: Once | INTRAVENOUS | Status: AC
  Administered 2020-09-12: 1000 mL via INTRAVENOUS

## 2020-09-12 NOTE — Therapy (Signed)
[place], [place], [address] Phone: [phone]   Fax:  [phone]  Physical Therapy Treatment  Patient Details  Name: Christina [name] MRN: [id number] Date of Birth: [birth year] Referring Provider (PT): [name]   Encounter Date: 09/12/2020   PT End of Session - 09/12/20 1703     Visit Number 8    Number of Visits 9    Date for PT Re-Evaluation 09/13/20    PT Stop Time 1400    Activity Tolerance Patient tolerated treatment well    Behavior During Therapy [facility] for tasks assessed/performed             Past Medical History:  Diagnosis Date   Arthritis    Chicken pox    Chronic back pain    HLD (hyperlipidemia)    diet controlled   Hypertension    Recurrent upper respiratory infection (URI)    Seasonal allergies    Vulvar cancer ([place])     Past Surgical History:  Procedure Laterality Date   BLADDER SUSPENSION     CHOLECYSTECTOMY     COLONOSCOPY     INGUINAL LYMPHADENECTOMY Bilateral 07/11/2020   Procedure: INGUINAL LYMPHADENECTOMY;  Surgeon: Everitt Amber, MD;  Location: [facility];  Service: Gynecology;  Laterality: Bilateral;   TOTAL KNEE ARTHROPLASTY Right 04/25/2020   Procedure: RIGHT TOTAL KNEE ARTHROPLASTY;  Surgeon: Meredith Pel, MD;  Location: [facility];  Service: Orthopedics;  Laterality: Right;   WISDOM TOOTH EXTRACTION      There were no vitals filed for this visit.   Subjective Assessment - 09/12/20 1405     Subjective Pt states she has only 8 more treatments for radiation. She is cold and tired all the time now    Pertinent History hx of vulvar cancer and underwent bilateral inguinal lymphadenectomy 07/11/20, developed cellulitis 07/18/20, currently undergoing radiation, 04/25/20- R TKA, pt got an ablation in Jan 2022 has chronic back pain, L4/L5 are compressed Pt has had problems with incontinece in the past and has pelvic PT and surgery for that    Patient Stated Goals to be out  of pain    Currently in Pain? Yes    Pain Score 3     Pain Location Perineum    Pain Orientation Mid    Pain Onset More than a month ago    Pain Frequency Constant                               [facility] Adult PT Treatment/Exercise - 09/12/20 0001       Manual Therapy   Manual Lymphatic Drainage (MLD) in supine with legs elevated: short neck,left axillary node, left inguinal-axillary anastamosis. left lateral hip and thigh, medial hip to knee and return along pathways the to right leg for thigh, leg, foot and ankle with return along pathways then same on right side                         PT Long Term Goals - 08/16/20 1501       PT LONG TERM GOAL #1   Title Pt will be independent with self MLD for long term management of edema.    Time 4    Period Weeks    Status New    Target Date 09/13/20      PT LONG TERM GOAL #2   Title Pt will report  a 50% improvement in swelling surrounding left groin scar to allow improved comfort    Time 4    Period Weeks    Status New    Target Date 09/13/20      PT LONG TERM GOAL #3   Title Pt will be independent in Strength ABC program to begin strengthening and stretching once she is cleared by her surgeon.    Time 4    Period Weeks    Status New    Target Date 09/13/20      PT LONG TERM GOAL #4   Title Pt will obtain appropriate compression garments for long term management of edema.    Time 4    Period Weeks    Status New    Target Date 09/13/20                   Plan - 09/12/20 1704     Clinical Impression Statement Pt very fatigued today and dozed for most of treatment. She has been sleeping with her legs elevated and she had little lymphedema in either leg today.  She continues to have firm area in left inguinal area that she says will likely be drained after radiation is complete    Personal Factors and Comorbidities Comorbidity 1    Comorbidities hx of chronic back pain with compressed  discs    Examination-Activity Limitations Locomotion Level    Stability/Clinical Decision Making Evolving/Moderate complexity    Rehab Potential Good    PT Frequency 2x / week    PT Duration 4 weeks    PT Treatment/Interventions ADLs/Self Care Home Management;Therapeutic exercise;Therapeutic activities;Patient/family education;Orthotic Fit/Training;Manual techniques;Manual lymph drainage;Compression bandaging;Scar mobilization;Taping;Vasopneumatic Device    PT Next Visit Plan Remeasuare, send recert . cont  MLD to LLE and instruct pt with cues on technique consider soft tissue mobilization over left groin scar,monitoring skin for radiation changes  MLD to Right LE    Consulted and Agree with Plan of Care Patient             Patient will benefit from skilled therapeutic intervention in order to improve the following deficits and impairments:  Pain, Increased edema, Decreased scar mobility, Difficulty walking  Visit Diagnosis: Localized edema  Difficulty walking  Muscle weakness (generalized)  Aftercare following surgery for neoplasm     Problem List 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[place]) 07/11/2020   Vulva cancer ([place]) 07/11/2020   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   Arthritis of right knee    S/P total knee arthroplasty, 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   Degenerative arthritis of knee, bilateral 12/31/2019   Patellofemoral arthritis of left knee 03/10/2019   Acute medial meniscus tear of left knee 11/19/2018   Sprain of medial collateral ligament of left knee 09/01/2018   Neck strain, initial encounter 08/28/2018   Degenerative disc disease, cervical 08/28/2018   Left lumbar radiculopathy 03/05/2018   Right lateral epicondylitis 10/30/2017   SI (sacroiliac) joint dysfunction 10/30/2017   Nonallopathic lesion of sacral region 10/30/2017   Nonallopathic  lesion of lumbar region 10/30/2017   Nonallopathic lesion of thoracic region 10/30/2017   History of recurrent UTIs 04/11/2017   Hypertension, essential, benign 10/29/2016   Allergic rhinitis 10/29/2016   Donato Heinz. Owens Shark PT  Norwood Levo 09/12/2020, 5:07 PM  [place], [place], [address] Phone: [phone]  Fax:  [phone]  Name: Christina [name] MRN: [id number] Date of Birth: January 17, 1961

## 2020-09-12 NOTE — ED Notes (Signed)
This RN presented the AVS utilizing Teachback Method. Patient verbalizes understanding of Discharge Instructions. Opportunity for Questioning and Answers were provided. Patient Discharged from ED ambulatory to Home with Significant Other.

## 2020-09-12 NOTE — ED Provider Notes (Signed)
[facility] EMERGENCY DEPT Provider Note   CSN: [id number] Arrival date & time: 09/12/20  2036     History Chief Complaint  Patient presents with   Emesis    Christina [name] is a 60 y.o. female with past medical history as below who is currently on radiation therapy for vulvar cancer. She presents after vomiting multiple times today. She endorsed nausea and stated she did not feel like eating since yesterday. She has noted increase in her urinary frequency. She also endorsed burning and pain in the vaginal area but states this has been there since getting radiation therapy. Urine culture done yesterday did not grow anything. She has not had a fever or chills.    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[place]) 07/11/2020   Vulva cancer ([place]) 07/11/2020   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   Arthritis of right knee    S/P total knee arthroplasty, 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   Degenerative arthritis of knee, bilateral 12/31/2019   Patellofemoral arthritis of left knee 03/10/2019   Acute medial meniscus tear of left knee 11/19/2018   Sprain of medial collateral ligament of left knee 09/01/2018   Neck strain, initial encounter 08/28/2018   Degenerative disc disease, cervical 08/28/2018   Left lumbar radiculopathy 03/05/2018   Right lateral epicondylitis 10/30/2017   SI (sacroiliac) joint dysfunction 10/30/2017   Nonallopathic lesion of sacral region 10/30/2017    Nonallopathic lesion of lumbar region 10/30/2017   Nonallopathic lesion of thoracic region 10/30/2017   History of recurrent UTIs 04/11/2017   Hypertension, essential, benign 10/29/2016   Allergic rhinitis 10/29/2016    Past Surgical History:  Procedure Laterality Date   BLADDER SUSPENSION     CHOLECYSTECTOMY     COLONOSCOPY     INGUINAL LYMPHADENECTOMY Bilateral 07/11/2020   Procedure: INGUINAL LYMPHADENECTOMY;  Surgeon: Everitt Amber, MD;  [facility];  Service: Gynecology;  Laterality: Bilateral;   TOTAL KNEE ARTHROPLASTY Right 04/25/2020   Procedure: RIGHT TOTAL KNEE ARTHROPLASTY;  Surgeon: Meredith Pel, MD;  Location: [facility];  Service: Orthopedics;  Laterality: Right;   WISDOM TOOTH EXTRACTION       OB History     Gravida  4   Para      Term      Preterm      AB  2   Living  4      SAB  2   IAB      Ectopic  0   Multiple      Live Births              Family History  Problem Relation Age of Onset   Hyperlipidemia Mother    Hypertension Mother    Stroke Mother    Breast cancer Mother    Prostate cancer Father    Hypertension Maternal Grandmother    Hypertension Paternal Grandmother    Stroke  Paternal Grandmother    Allergic rhinitis Neg Hx    Asthma Neg Hx    Ovarian cancer Neg Hx    Uterine cancer Neg Hx    Colon cancer Neg Hx    Pancreatic cancer Neg Hx     Social History   Tobacco Use   Smoking status: Former    Packs/day: 0.50    Years: 2.00    Pack years: 1.00    Types: Cigarettes   Smokeless tobacco: Never   Tobacco comments:    Smoke in 20's  Vaping Use   Vaping Use: Never used  Substance Use Topics   Alcohol use: Not Currently   Drug use: No    Home Medications Prior to Admission medications   Medication Sig Start Date End Date Taking? Authorizing Provider  cephALEXin (KEFLEX) 500 MG capsule Take 1 capsule (500 mg total) by mouth 2 (two) times daily for 5 days. 09/12/20 09/17/20 Yes Idamae Schuller, MD   acetaminophen (TYLENOL) 325 MG tablet Take 650 mg by mouth every 6 (six) hours as needed for moderate pain. Patient not taking: No sig reported    [provider]  cetirizine (ZYRTEC) 10 MG tablet TAKE 1 TO 2 TABLETS BY MOUTH DAILY AS NEEDED Patient taking differently: Take 10 mg by mouth daily. 03/30/20   Valentina Shaggy, MD  Cholecalciferol (VITAMIN D) 50 MCG (2000 UT) tablet Take 2,000 Units by mouth daily.    [provider]  diphenhydramine-acetaminophen (TYLENOL PM) 25-500 MG TABS tablet Take 2 tablets by mouth at bedtime. Patient not taking: No sig reported    [provider]  ibuprofen (ADVIL) 600 MG tablet Take 1 tablet (600 mg total) by mouth every 6 (six) hours as needed for moderate pain. 08/28/20   Gery Pray, MD  ipratropium (ATROVENT) 0.06 % nasal spray USE 2 SPRAYS IN [facility] NOSTRIL THREE TIMES DAILY 08/04/20   Valentina Shaggy, MD  levocetirizine (XYZAL) 5 MG tablet Take 1 tablet (5 mg total) by mouth every evening. 04/02/19   Valentina Shaggy, MD  lidocaine (XYLOCAINE) 2 % solution Apply small amount over sore lesion up to every 4 hours as needed for pain 08/17/20   Gery Pray, MD  lidocaine (XYLOCAINE) 5 % ointment Apply 1 application topically 4 (four) times daily as needed for moderate pain. 08/22/20   Gery Pray, MD  LORazepam (ATIVAN) 0.5 MG tablet Take 1 tablet (0.5 mg total) by mouth every 8 (eight) hours. As needed for nausea or anxiety 07/03/20   Gery Pray, MD  losartan (COZAAR) 50 MG tablet TAKE 1 TABLET(50 MG) BY MOUTH DAILY 06/16/20   Panosh, Standley Brooking, MD  ondansetron (ZOFRAN ODT) 4 MG disintegrating tablet '4mg'$  ODT q4 hours prn nausea/vomit 08/27/20   Deno Etienne, DO  oxyCODONE (OXY IR/ROXICODONE) 5 MG immediate release tablet Take 1 tablet (5 mg total) by mouth every 4 (four) hours as needed for severe pain. For AFTER surgery, do not take and drive [id number]   Eppie Gibson, MD  oxyCODONE ER [facility] ER) 9 MG C12A Take 9 mg by  mouth 2 (two) times daily. 12 hours apart 08/29/20   Gery Pray, MD  oxyCODONE-acetaminophen (PERCOCET/ROXICET) 5-325 MG tablet 1 po at bedtime as needed Patient not taking: No sig reported 06/14/20   Meredith Pel, MD  phenazopyridine (PYRIDIUM) 200 MG tablet Take 1 tablet (200 mg total) by mouth 3 (three) times daily as needed for pain. 09/11/20   Gery Pray, MD  Probiotic Product (PROBIOTIC PO)  Take 1 tablet by mouth daily.    [provider]  senna-docusate (SENOKOT-S) 8.6-50 MG tablet Take 2 tablets by mouth at bedtime. For AFTER surgery, do not take if having diarrhea Patient not taking: No sig reported 07/07/20   [name] D, NP    Allergies    Patient has no known allergies.  Review of Systems   Review of Systems  Constitutional:  Positive for appetite change. Negative for chills and fever.  HENT:  Negative for ear pain and sore throat.   Eyes:  Negative for pain and visual disturbance.  Respiratory:  Negative for cough and shortness of breath.   Cardiovascular:  Negative for chest pain and palpitations.  Gastrointestinal:  Positive for diarrhea, nausea and vomiting. Negative for abdominal pain.  Genitourinary:  Positive for dysuria (radiation therapy ongoing) and vaginal pain (radiation therapy ongoing). Negative for hematuria.  Musculoskeletal:  Negative for arthralgias and back pain.  Skin:  Negative for color change and rash.  Neurological:  Negative for seizures and syncope.  Psychiatric/Behavioral:  Negative for agitation and behavioral problems.   All other systems reviewed and are negative.  Physical Exam Updated Vital Signs BP (!) 151/77 (BP Location: Left Arm)   Pulse 78   Temp 98.4 F (36.9 C) (Oral)   Resp 18   Ht '5\' 1"'$  (1.549 m)   Wt 59 kg   SpO2 94%   BMI 24.56 kg/m   Physical Exam Constitutional:      Appearance: Normal appearance.  HENT:     Head: Normocephalic and atraumatic.     Right Ear: External ear normal.     Left Ear:  External ear normal.     Nose: Nose normal. No rhinorrhea.     Mouth/Throat:     Mouth: Mucous membranes are dry.     Pharynx: Oropharynx is clear. No posterior oropharyngeal erythema.  Eyes:     Extraocular Movements: Extraocular movements intact.     Conjunctiva/sclera: Conjunctivae normal.     Pupils: Pupils are equal, round, and reactive to light.  Cardiovascular:     Rate and Rhythm: Normal rate and regular rhythm.     Pulses: Normal pulses.     Heart sounds: Normal heart sounds.  Pulmonary:     Effort: Pulmonary effort is normal.     Breath sounds: Normal breath sounds.  Abdominal:     General: Bowel sounds are normal. There is no distension.     Palpations: Abdomen is soft. There is no mass.  Musculoskeletal:        General: No swelling or tenderness. Normal range of motion.     Cervical back: Normal range of motion and neck supple.  Skin:    General: Skin is warm and dry.     Capillary Refill: Capillary refill takes less than 2 seconds.     Coloration: Skin is not jaundiced.  Neurological:     General: No focal deficit present.     Mental Status: She is alert and oriented to person, place, and time.  Psychiatric:        Mood and Affect: Mood normal.        Behavior: Behavior normal.    ED Results / Procedures / Treatments   Labs (all labs ordered are listed, but only abnormal results are displayed) Labs Reviewed  CBC WITH DIFFERENTIAL/PLATELET - Abnormal; Notable for the following components:      Result Value   RBC 3.63 (*)    Hemoglobin 10.6 (*)  HCT 33.2 (*)    Lymphs Abs 0.3 (*)    All other components within normal limits  COMPREHENSIVE METABOLIC PANEL - Abnormal; Notable for the following components:   Glucose, Bld 113 (*)    AST 13 (*)    Total Bilirubin 1.3 (*)    All other components within normal limits  URINALYSIS, ROUTINE W REFLEX MICROSCOPIC - Abnormal; Notable for the following components:   Color, Urine ORANGE (*)    Bilirubin Urine SMALL  (*)    Protein, ur TRACE (*)    Nitrite POSITIVE (*)    All other components within normal limits  URINE CULTURE    EKG None  Radiology No results found.  Procedures Procedures   Medications Ordered in ED Medications  sodium chloride 0.9 % bolus 1,000 mL (0 mLs Intravenous Stopped 09/12/20 2304)  ondansetron (ZOFRAN) injection 4 mg (4 mg Intravenous Given 09/12/20 2148)  cephALEXin (KEFLEX) capsule 500 mg (500 mg Oral Given 09/12/20 2236)    ED Course  I have reviewed the triage vital signs and the nursing notes.  Pertinent labs & imaging results that were available during my care of the patient were reviewed by me and considered in my medical decision making (see chart for details).    MDM Rules/Calculators/A&P                           Vomiting with concern for UTI Patient presented to the ED after a few episodes of vomiting. Ddx include side effect from her radiation therapy vs UTI vs pyelonephritis vs food poisoning vs dehydration. Patient had a similar episode of vomiting on 07/10 and was suspected of having a UTI and treated with Keflex. She missed a couple of days of the abx but completed the course. She feels she has another UTI 2/2 to her not taking the medications as prescribed. This is a possibility but her UA is not definitive for UTI but due her taking pyridium masks some of the indicators for UTI. Many of the signs that are typically present cannot be relied as she is undergoing vulvar radiation. Another likely reason could be a side effect of the radiation treatment. Pyelonephritis is less likely as patient is not having fevers, chills, leukocytosis, or CVA tenderness. Food poisoning could lead to her vomiting but medication side effect needs to be ruled out as well. Dehydration may be exacerbating her condition and causing her feeling of unwell along with radiation therapy. She indicated less PO intake prior to the vomiting episode. We will give her antibiotics for  potential UTI as patient is undergoing sensitive treatment and her condition would be further complicated by a missed UTI leading to missed radiation therapies. I advised her to contact her oncology office and see if these side effects could be coming from her radiation therapy.  Plan: -CBC, CMP, UA, Urine culture  -negative for acute findings, culture pending -Zofran, and 1L bolus IV fluids -Follow up with oncology tomorrow during her radiation treatment regarding potential side effects of the treatments and how to alleviate them.  -Discharge with Zofran and Keflex 500 mg BID for 5 days   Final Clinical Impression(s) / ED Diagnoses Final diagnoses:  Vomiting, intractability of vomiting not specified, presence of nausea not specified, unspecified vomiting type    Rx / DC Orders ED Discharge Orders          Ordered    cephALEXin (KEFLEX) 500 MG capsule  2 times  daily        09/12/20 2246             Idamae Schuller, MD 09/12/20 [place], [place], DO 09/12/20 2313

## 2020-09-12 NOTE — ED Triage Notes (Signed)
Pt is on radiation treatment of vulvar cancer - pt states she started feeling bad since Sunday  similar when she had UTI 2 wks ago. Denies fever   Also states she vomited to tonight 4x. Had her radiation treatment today.

## 2020-09-12 NOTE — ED Provider Notes (Signed)
I have personally seen and examined the patient. I have reviewed the documentation on PMH/FH/Soc Hx. I have discussed the plan of care with the resident and patient.  I have reviewed and agree with the resident's documentation. Please see associated encounter note.  Briefly, the patient is a 60 y.o. female here with nausea and vomiting, concern for UTI.  Currently with vulvar cancer undergoing radiation treatment.  Develops nausea vomiting today, dysuria.  Denies any abdominal pain.  Has had chronic diarrhea during this process.  She denies any melena hematochezia.  Hemoglobin is 10.6, slightly below her baseline.  She is adamant that she not having any dark stools or bloody stools and does not want rectal exam.  I think this is reasonable as I have low suspicion for GI bleed.  Overall lab work is otherwise unremarkable.  She would like to be started on antibiotics for possible UTI.  Urine culture was collected yesterday that was unremarkable.  Culture is still pending growth.  We will start her on Keflex and send new culture today.  Felt better after IV fluids.  No concern for intra-abdominal process.  Not having any abdominal pain.  Given return precautions and discharged in ED in good condition.  Will prescribe Zofran.  Overall suspect symptoms are secondary to radiation treatment.  This chart was dictated using voice recognition software.  Despite best efforts to proofread,  errors can occur which can change the documentation meaning.    EKG Interpretation None           Lennice Sites, DO 09/12/20 2234

## 2020-09-12 NOTE — Discharge Instructions (Addendum)
You came to ED after vomiting this evening. You also believe you still have a UTI. Your urine does not show a definitive infection and the culture from yesterday shows no growth so far. The nausea and vomiting can be coming from the radiation therapy that you are getting as your regimen is intense with daily treatments. We did urine culture and if it grows any bacteria, we will let you know. Due to your frequent UTI history, and your symptoms being consistent with a UTI and some symptoms being masked by having continuous vaginal irritation from the radiation therapy. I will prescribe a course of Keflex as it has helped you with similar symptoms previously. I would advise you to call your oncology office and inquire if nausea and vomiting can be coming from your radiation therapy. If you experience chest pain or shortness of breath or have difficulty keeping food or water in your stomach, please come back to the ED.

## 2020-09-13 ENCOUNTER — Other Ambulatory Visit: Payer: Self-pay

## 2020-09-13 ENCOUNTER — Encounter: Payer: Self-pay | Admitting: Gynecologic Oncology

## 2020-09-13 ENCOUNTER — Telehealth: Payer: Self-pay | Admitting: Oncology

## 2020-09-13 ENCOUNTER — Ambulatory Visit: Admission: RE | Admit: 2020-09-13 | Payer: 59 | Source: Ambulatory Visit

## 2020-09-13 ENCOUNTER — Ambulatory Visit: Payer: 59

## 2020-09-13 NOTE — Telephone Encounter (Signed)
Christina [name] called and said she is very frustrated.  She didn't feel well yesterday and vomited "violently" 4 times.  She went to the ER and was given fluids and cephalexin and was sent home with a prescription for keflex and Zofran.  She does feel better this morning and wonders what she is doing wrong.  She wants to make a note in her chart that this is how she reacts to a UTI (nausea/vomiting) and needs an antibiotic.  She said she "cant' go through this again." Advised her that I will notify Dr. Sondra Come and [name], NP.

## 2020-09-14 ENCOUNTER — Ambulatory Visit
Admission: RE | Admit: 2020-09-14 | Discharge: 2020-09-14 | Disposition: A | Discharge status: Home / Self Care | Payer: 59 | Source: Ambulatory Visit | Attending: Radiation Oncology | Admitting: Radiation Oncology

## 2020-09-14 ENCOUNTER — Ambulatory Visit: Payer: 59 | Admitting: Physical Therapy

## 2020-09-14 DIAGNOSIS — M6281 Muscle weakness (generalized): Secondary | ICD-10-CM

## 2020-09-14 DIAGNOSIS — R600 Localized edema: Secondary | ICD-10-CM | POA: Diagnosis not present

## 2020-09-14 DIAGNOSIS — R262 Difficulty in walking, not elsewhere classified: Secondary | ICD-10-CM

## 2020-09-14 DIAGNOSIS — R300 Dysuria: Secondary | ICD-10-CM | POA: Diagnosis not present

## 2020-09-14 DIAGNOSIS — Z483 Aftercare following surgery for neoplasm: Secondary | ICD-10-CM

## 2020-09-14 LAB — URINE CULTURE: Culture: <10000 — AB

## 2020-09-14 NOTE — Therapy (Signed)
[place], [place], [address] Phone: [phone]   Fax:  [phone]  Physical Therapy Treatment  Patient Details  Name: Christina [name] MRN: [id number] Date of Birth: 09-23-1960 Referring Provider (PT): [name]   Encounter Date: 09/14/2020   PT End of Session - 09/14/20 1539     Visit Number 9    Number of Visits 33    Date for PT Re-Evaluation 12/15/20    PT Start Time 1400    PT Stop Time 1455    PT Time Calculation (min) 55 min    Activity Tolerance Patient tolerated treatment well    Behavior During Therapy [facility] for tasks assessed/performed             Past Medical History:  Diagnosis Date   Arthritis    Chicken pox    Chronic back pain    HLD (hyperlipidemia)    diet controlled   Hypertension    Recurrent upper respiratory infection (URI)    Seasonal allergies    Vulvar cancer ([place])     Past Surgical History:  Procedure Laterality Date   BLADDER SUSPENSION     CHOLECYSTECTOMY     COLONOSCOPY     INGUINAL LYMPHADENECTOMY Bilateral 07/11/2020   Procedure: INGUINAL LYMPHADENECTOMY;  Surgeon: Everitt Amber, MD;  Location: [facility];  Service: Gynecology;  Laterality: Bilateral;   TOTAL KNEE ARTHROPLASTY Right 04/25/2020   Procedure: RIGHT TOTAL KNEE ARTHROPLASTY;  Surgeon: Meredith Pel, MD;  Location: [place];  Service: Orthopedics;  Laterality: Right;   WISDOM TOOTH EXTRACTION      There were no vitals filed for this visit.   Subjective Assessment - 09/14/20 1407     Subjective Pt was in the ER on Tuesday night.  She is now on an antibiotic for an UTI.  She also was told that she has to have another 5 radiation treatments    Pertinent History hx of vulvar cancer and underwent bilateral inguinal lymphadenectomy 07/11/20, developed cellulitis 07/18/20, currently undergoing radiation, 04/25/20- R TKA, pt got an ablation in Jan 2022 has chronic back pain, L4/L5 are compressed Pt has had  problems with incontinece in the past and has pelvic PT and surgery for that    Currently in Pain? Yes    Pain Score 3     Pain Location Perineum    Pain Orientation Mid                [facility] PT Assessment - 09/14/20 0001       Assessment   Medical Diagnosis vuvlar cancer    Referring Provider (PT) Melissa Cross    Onset Date/Surgical Date 07/11/20      Prior Function   Level of Independence Independent               LYMPHEDEMA/ONCOLOGY QUESTIONNAIRE - 09/14/20 0001       Type   Cancer Type vulvar cancer      Lymphedema Assessments   Lymphedema Assessments Lower extremities      Right Lower Extremity Lymphedema   20 cm Proximal to Suprapatella 55 cm    10 cm Proximal to Suprapatella 44.3 cm    At Midpatella/Popliteal Crease 33 cm    30 cm Proximal to Floor at Lateral Plantar Foot 35 cm    20 cm Proximal to Floor at Lateral Plantar Foot '27 1    10 '$ cm Proximal to Floor at Lateral Malleoli 19 cm    5  cm Proximal to 1st MTP Joint 21.1 cm    Across MTP Joint 21.3 cm    Around Proximal Great Toe 7 cm      Left Lower Extremity Lymphedema   20 cm Proximal to Suprapatella 55.1 cm    10 cm Proximal to Suprapatella 43.5 cm    At Midpatella/Popliteal Crease 31.5 cm    30 cm Proximal to Floor at Lateral Plantar Foot 34.5 cm    20 cm Proximal to Floor at Lateral Plantar Foot 25 cm    10 cm Proximal to Floor at Lateral Malleoli 19.7 cm    5 cm Proximal to 1st MTP Joint 22 cm    Across MTP Joint 21.5 cm    Around Proximal Great Toe 7.2 cm                        [facility] Adult PT Treatment/Exercise - 09/14/20 0001       Manual Therapy   Manual Therapy Edema management;Manual Lymphatic Drainage (MLD)    Manual therapy comments remeasured  both leg circumference    Manual Lymphatic Drainage (MLD) in supine with legs elevated: short neck,left axillary node, left inguinal-axillary anastamosis. left lateral hip and thigh, medial hip to knee and return along  pathways the to right leg for thigh, leg, foot and ankle with return along pathways then same on right side then to left sidelying for more work on right posterior thigh                         PT Long Term Goals - 09/14/20 1546       PT LONG TERM GOAL #1   Title Pt will be independent with self MLD for long term management of edema.    Time 12    Period Weeks    Status On-going      PT LONG TERM GOAL #2   Title Pt will report a 50% improvement in swelling surrounding left groin scar to allow improved comfort    Time 12    Period Weeks    Status On-going      PT LONG TERM GOAL #3   Title Pt will be independent in Strength ABC program to begin strengthening and stretching once she is cleared by her surgeon.    Time 12    Period Weeks    Status On-going      PT LONG TERM GOAL #4   Title Pt will obtain appropriate compression garments for long term management of edema.    Status Achieved                   Plan - 09/14/20 1539     Clinical Impression Statement Pt continues to have effects of radiation with fatigue, diarrhea and UTI. She continues to lose weight and is dehydrated from the diarrhea despite her best efforts.  She has lymphedema in both upper thighs with increased measurements from evaluation today. She gets symptomatic relief from MLD likely due to the stimulation of the parasympathetic nervous system, but may not be able to attend visits in the next few weeks due to her fatigue. Pt advised to call for appointments as she needs them. Renewal sent today to for next 12 weeks although visits will be sporadic depending on how pt is able to manage her symptoms.    Personal Factors and Comorbidities Comorbidity 1    Examination-Activity Limitations Locomotion Level  Stability/Clinical Decision Making Evolving/Moderate complexity    PT Frequency 2x / week   likely will have sporadic visits   PT Duration 12 weeks    PT Treatment/Interventions  ADLs/Self Care Home Management;Therapeutic exercise;Therapeutic activities;Patient/family education;Orthotic Fit/Training;Manual techniques;Manual lymph drainage;Compression bandaging;Scar mobilization;Taping;Vasopneumatic Device    PT Next Visit Plan cont  MLD to both LEs,monitoring effects of radiation.Remeasure and  Progress as indicated             Patient will benefit from skilled therapeutic intervention in order to improve the following deficits and impairments:  Pain, Increased edema, Decreased scar mobility, Difficulty walking  Visit Diagnosis: Localized edema - Plan: PT plan of care cert/re-cert  Difficulty walking - Plan: PT plan of care cert/re-cert  Muscle weakness (generalized) - Plan: PT plan of care cert/re-cert  Aftercare following surgery for neoplasm - Plan: PT plan of care cert/re-cert     Problem List 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[place]) 07/11/2020   Vulva cancer ([place]) 07/11/2020   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   Arthritis of right knee    S/P total knee arthroplasty, 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   Degenerative arthritis of knee, bilateral 12/31/2019   Patellofemoral arthritis of left knee 03/10/2019   Acute medial meniscus tear of left knee 11/19/2018   Sprain of medial collateral ligament of left knee 09/01/2018   Neck strain, initial encounter 08/28/2018   Degenerative disc disease, cervical 08/28/2018   Left lumbar radiculopathy 03/05/2018   Right lateral epicondylitis 10/30/2017   SI (sacroiliac) joint dysfunction 10/30/2017   Nonallopathic lesion of sacral region 10/30/2017   Nonallopathic lesion of lumbar region 10/30/2017   Nonallopathic lesion of thoracic region 10/30/2017   History of recurrent UTIs 04/11/2017   Hypertension, essential, benign 10/29/2016   Allergic rhinitis 10/29/2016   Donato Heinz. Owens Shark PT  Norwood Levo 09/14/2020, 3:50 PM  [place], [place], [address] Phone: [phone]   Fax:  [phone]  Name: Christina [name] MRN: [id number] Date of Birth: 01/23/1961

## 2020-09-14 NOTE — Telephone Encounter (Unsigned)
So sorry you are having a tough time. I looked at the records and the cultures are not coming out consistent with a UTI however does not totally rule it out.  We are where you have had these before quite a bit.   There is a risk to using antibiotics for symptoms alone  without directed care in your medical situation. UTI  symptoms such as painful urination can come from external vaginal or urethral area inflammation .  which I suspect could happen with your radiation treatment.  So we would often direct care based on the culture.  Looks like you have had 2 rounds of antibiotics in the last month but no significant bacteria found in the urine  I think that  clarity may be helped by seeing urologist   if any benefit from being on suppressive antibiotic therapy   or other preventive other . Measures   Are you willing to see urologist again or want to wait till after the radiation therapy is finished?

## 2020-09-15 ENCOUNTER — Ambulatory Visit
Admission: RE | Admit: 2020-09-15 | Discharge: 2020-09-15 | Disposition: A | Discharge status: Home / Self Care | Payer: 59 | Source: Ambulatory Visit | Attending: Radiation Oncology | Admitting: Radiation Oncology

## 2020-09-15 ENCOUNTER — Other Ambulatory Visit: Payer: Self-pay

## 2020-09-15 DIAGNOSIS — R300 Dysuria: Secondary | ICD-10-CM | POA: Diagnosis not present

## 2020-09-17 ENCOUNTER — Encounter: Payer: Self-pay | Admitting: Gynecologic Oncology

## 2020-09-18 ENCOUNTER — Ambulatory Visit
Admission: RE | Admit: 2020-09-18 | Discharge: 2020-09-18 | Disposition: A | Discharge status: Home / Self Care | Payer: 59 | Source: Ambulatory Visit | Attending: Radiation Oncology | Admitting: Radiation Oncology

## 2020-09-18 ENCOUNTER — Telehealth: Payer: Self-pay | Admitting: Dietician

## 2020-09-18 ENCOUNTER — Other Ambulatory Visit: Payer: Self-pay

## 2020-09-18 DIAGNOSIS — C519 Malignant neoplasm of vulva, unspecified: Secondary | ICD-10-CM | POA: Diagnosis present

## 2020-09-18 DIAGNOSIS — Z510 Encounter for antineoplastic radiation therapy: Secondary | ICD-10-CM | POA: Insufficient documentation

## 2020-09-18 NOTE — Telephone Encounter (Signed)
Cancelled appt per 8/1 sch msg. Pt declined to r/s at this time.

## 2020-09-19 ENCOUNTER — Other Ambulatory Visit: Payer: Self-pay | Admitting: Internal Medicine

## 2020-09-19 ENCOUNTER — Inpatient Hospital Stay: Payer: 59 | Admitting: Dietician

## 2020-09-19 ENCOUNTER — Ambulatory Visit
Admission: RE | Admit: 2020-09-19 | Discharge: 2020-09-19 | Disposition: A | Discharge status: Home / Self Care | Payer: 59 | Source: Ambulatory Visit | Attending: Radiation Oncology | Admitting: Radiation Oncology

## 2020-09-19 ENCOUNTER — Encounter: Payer: Self-pay | Admitting: Gynecologic Oncology

## 2020-09-19 ENCOUNTER — Other Ambulatory Visit: Payer: Self-pay | Admitting: Radiation Oncology

## 2020-09-19 DIAGNOSIS — I10 Essential (primary) hypertension: Secondary | ICD-10-CM

## 2020-09-19 DIAGNOSIS — Z87440 Personal history of urinary (tract) infections: Secondary | ICD-10-CM

## 2020-09-19 DIAGNOSIS — C519 Malignant neoplasm of vulva, unspecified: Secondary | ICD-10-CM

## 2020-09-19 LAB — URINALYSIS, COMPLETE (UACMP) WITH MICROSCOPIC
Bilirubin Urine: NEGATIVE
Glucose, UA: NEGATIVE mg/dL
Ketones, ur: NEGATIVE mg/dL
Leukocytes,Ua: NEGATIVE
Nitrite: POSITIVE — AB
Protein, ur: 30 mg/dL — AB
Specific Gravity, Urine: 1.015 (ref 1.005–1.030)
WBC, UA: >50 WBC/hpf — ABNORMAL HIGH (ref 0–5)
pH: 5 (ref 5.0–8.0)

## 2020-09-19 MED ORDER — OXYCODONE HCL 5 MG PO TABS: 5.0000 mg | ORAL_TABLET | ORAL | 0 refills | Status: DC | PRN

## 2020-09-19 MED ORDER — PHENAZOPYRIDINE HCL 200 MG PO TABS: 200.0000 mg | ORAL_TABLET | Freq: Three times a day (TID) | ORAL | 1 refills | Status: DC | PRN

## 2020-09-19 MED ORDER — CEPHALEXIN 500 MG PO CAPS: 500.0000 mg | ORAL_CAPSULE | Freq: Four times a day (QID) | ORAL | 0 refills | Status: DC

## 2020-09-20 ENCOUNTER — Ambulatory Visit
Admission: RE | Admit: 2020-09-20 | Discharge: 2020-09-20 | Disposition: A | Discharge status: Home / Self Care | Payer: 59 | Source: Ambulatory Visit | Attending: Radiation Oncology | Admitting: Radiation Oncology

## 2020-09-20 DIAGNOSIS — C519 Malignant neoplasm of vulva, unspecified: Secondary | ICD-10-CM | POA: Diagnosis not present

## 2020-09-20 LAB — URINE CULTURE: Culture: NO GROWTH

## 2020-09-21 ENCOUNTER — Ambulatory Visit: Payer: 59

## 2020-09-21 ENCOUNTER — Other Ambulatory Visit: Payer: Self-pay

## 2020-09-21 ENCOUNTER — Ambulatory Visit
Admission: RE | Admit: 2020-09-21 | Discharge: 2020-09-21 | Disposition: A | Discharge status: Home / Self Care | Payer: 59 | Source: Ambulatory Visit | Attending: Radiation Oncology | Admitting: Radiation Oncology

## 2020-09-21 DIAGNOSIS — C519 Malignant neoplasm of vulva, unspecified: Secondary | ICD-10-CM | POA: Diagnosis not present

## 2020-09-22 ENCOUNTER — Ambulatory Visit: Payer: 59

## 2020-09-22 ENCOUNTER — Ambulatory Visit
Admission: RE | Admit: 2020-09-22 | Discharge: 2020-09-22 | Disposition: A | Discharge status: Home / Self Care | Payer: 59 | Source: Ambulatory Visit | Attending: Radiation Oncology | Admitting: Radiation Oncology

## 2020-09-22 ENCOUNTER — Encounter: Payer: Self-pay | Admitting: Gynecologic Oncology

## 2020-09-22 ENCOUNTER — Encounter: Payer: Self-pay | Admitting: Oncology

## 2020-09-22 DIAGNOSIS — C519 Malignant neoplasm of vulva, unspecified: Secondary | ICD-10-CM | POA: Diagnosis not present

## 2020-09-25 ENCOUNTER — Telehealth: Payer: Self-pay | Admitting: Oncology

## 2020-09-25 ENCOUNTER — Encounter: Payer: Self-pay | Admitting: Gynecologic Oncology

## 2020-09-25 ENCOUNTER — Ambulatory Visit
Admission: RE | Admit: 2020-09-25 | Discharge: 2020-09-25 | Disposition: A | Discharge status: Home / Self Care | Payer: 59 | Source: Ambulatory Visit | Attending: Radiation Oncology | Admitting: Radiation Oncology

## 2020-09-25 DIAGNOSIS — C519 Malignant neoplasm of vulva, unspecified: Secondary | ICD-10-CM | POA: Diagnosis not present

## 2020-09-25 NOTE — Telephone Encounter (Signed)
Called Christina [name] to see how she is doing.  She said she is doing ok and does not need anything at this time.

## 2020-09-26 ENCOUNTER — Ambulatory Visit
Admission: RE | Admit: 2020-09-26 | Discharge: 2020-09-26 | Disposition: A | Discharge status: Home / Self Care | Payer: 59 | Source: Ambulatory Visit | Attending: Radiation Oncology | Admitting: Radiation Oncology

## 2020-09-26 ENCOUNTER — Other Ambulatory Visit: Payer: Self-pay

## 2020-09-26 ENCOUNTER — Other Ambulatory Visit: Payer: Self-pay | Admitting: Radiation Oncology

## 2020-09-26 DIAGNOSIS — C519 Malignant neoplasm of vulva, unspecified: Secondary | ICD-10-CM

## 2020-09-26 MED ORDER — ONDANSETRON 4 MG PO TBDP: ORAL_TABLET | ORAL | 1 refills | Status: DC

## 2020-09-27 ENCOUNTER — Ambulatory Visit
Admission: RE | Admit: 2020-09-27 | Discharge: 2020-09-27 | Disposition: A | Discharge status: Home / Self Care | Payer: 59 | Source: Ambulatory Visit | Attending: Radiation Oncology | Admitting: Radiation Oncology

## 2020-09-27 DIAGNOSIS — C519 Malignant neoplasm of vulva, unspecified: Secondary | ICD-10-CM | POA: Diagnosis not present

## 2020-09-28 ENCOUNTER — Other Ambulatory Visit: Payer: Self-pay

## 2020-09-28 ENCOUNTER — Ambulatory Visit: Payer: 59

## 2020-09-28 ENCOUNTER — Ambulatory Visit
Admission: RE | Admit: 2020-09-28 | Discharge: 2020-09-28 | Disposition: A | Discharge status: Home / Self Care | Payer: 59 | Source: Ambulatory Visit | Attending: Radiation Oncology | Admitting: Radiation Oncology

## 2020-09-28 DIAGNOSIS — C519 Malignant neoplasm of vulva, unspecified: Secondary | ICD-10-CM | POA: Diagnosis not present

## 2020-09-29 ENCOUNTER — Ambulatory Visit
Admission: RE | Admit: 2020-09-29 | Discharge: 2020-09-29 | Disposition: A | Discharge status: Home / Self Care | Payer: 59 | Source: Ambulatory Visit | Attending: Radiation Oncology | Admitting: Radiation Oncology

## 2020-09-29 DIAGNOSIS — C519 Malignant neoplasm of vulva, unspecified: Secondary | ICD-10-CM | POA: Diagnosis not present

## 2020-10-02 ENCOUNTER — Ambulatory Visit
Admission: RE | Admit: 2020-10-02 | Discharge: 2020-10-02 | Disposition: A | Discharge status: Home / Self Care | Payer: 59 | Source: Ambulatory Visit | Attending: Radiation Oncology | Admitting: Radiation Oncology

## 2020-10-02 ENCOUNTER — Other Ambulatory Visit: Payer: Self-pay

## 2020-10-02 ENCOUNTER — Encounter: Payer: Self-pay | Admitting: Radiation Oncology

## 2020-10-02 DIAGNOSIS — C519 Malignant neoplasm of vulva, unspecified: Secondary | ICD-10-CM | POA: Diagnosis not present

## 2020-10-03 ENCOUNTER — Other Ambulatory Visit: Payer: Self-pay | Admitting: Radiation Oncology

## 2020-10-03 DIAGNOSIS — C519 Malignant neoplasm of vulva, unspecified: Secondary | ICD-10-CM

## 2020-10-03 MED ORDER — IBUPROFEN 600 MG PO TABS: 600.0000 mg | ORAL_TABLET | Freq: Four times a day (QID) | ORAL | 0 refills | Status: DC | PRN

## 2020-10-08 ENCOUNTER — Encounter: Payer: Self-pay | Admitting: Gynecologic Oncology

## 2020-10-10 ENCOUNTER — Other Ambulatory Visit: Payer: Self-pay | Admitting: Radiation Oncology

## 2020-10-10 ENCOUNTER — Encounter: Payer: Self-pay | Admitting: Gynecologic Oncology

## 2020-10-10 MED ORDER — PHENAZOPYRIDINE HCL 200 MG PO TABS: 200.0000 mg | ORAL_TABLET | Freq: Three times a day (TID) | ORAL | 1 refills | Status: DC | PRN

## 2020-10-11 ENCOUNTER — Encounter: Payer: Self-pay | Admitting: Oncology

## 2020-10-11 ENCOUNTER — Other Ambulatory Visit: Payer: Self-pay | Admitting: Radiation Oncology

## 2020-10-11 ENCOUNTER — Telehealth: Payer: Self-pay | Admitting: Oncology

## 2020-10-11 DIAGNOSIS — C519 Malignant neoplasm of vulva, unspecified: Secondary | ICD-10-CM

## 2020-10-11 MED ORDER — OXYCODONE HCL 10 MG PO TABS: 10.0000 mg | ORAL_TABLET | ORAL | 0 refills | Status: DC | PRN

## 2020-10-11 NOTE — Telephone Encounter (Signed)
Called Christina [name] and discussed the pain she is having per her Mychart message.  She said she is taking 1 tablet of Oxy IR 5 mg and then waiting 1 hour and taking another Oxy and then waiting 2 hours then taking another Oxy and ibuprofen.  This is the only thing that will help with her pain.    Discussed taking the extended release oxycodone (Xtampza) along with the Oxy IR 5 mg q 4-6 hours for breakthrough pain per Dr. Sondra Come.  She said she is very nervous to take the long acting because it can cause constipation.  We did discuss that both types of oxycodone can cause constipation but she is still hesitant to try the long acting. She is wondering if Dr. Sondra Come could increase the dose of Oxy IR to 10 mg.  She would like a refill sent to [facility]. Notified Dr. Sondra Come of patient's request.

## 2020-10-12 NOTE — Telephone Encounter (Signed)
Notified patient of script sent to pharmacy. 

## 2020-10-13 ENCOUNTER — Encounter: Payer: Self-pay | Admitting: Gynecologic Oncology

## 2020-10-13 ENCOUNTER — Other Ambulatory Visit: Payer: Self-pay

## 2020-10-13 ENCOUNTER — Encounter: Payer: Self-pay | Admitting: Oncology

## 2020-10-13 ENCOUNTER — Inpatient Hospital Stay: Payer: 59 | Attending: Gynecologic Oncology | Admitting: Gynecologic Oncology

## 2020-10-13 VITALS — BP 118/78 | HR 79 | Temp 99.2°F | Resp 16 | Ht 61.0 in | Wt 128.2 lb

## 2020-10-13 DIAGNOSIS — Z923 Personal history of irradiation: Secondary | ICD-10-CM | POA: Insufficient documentation

## 2020-10-13 DIAGNOSIS — C519 Malignant neoplasm of vulva, unspecified: Secondary | ICD-10-CM | POA: Insufficient documentation

## 2020-10-13 DIAGNOSIS — Z87891 Personal history of nicotine dependence: Secondary | ICD-10-CM | POA: Insufficient documentation

## 2020-10-13 DIAGNOSIS — E785 Hyperlipidemia, unspecified: Secondary | ICD-10-CM | POA: Diagnosis not present

## 2020-10-13 DIAGNOSIS — I10 Essential (primary) hypertension: Secondary | ICD-10-CM | POA: Diagnosis not present

## 2020-10-13 DIAGNOSIS — I890 Lymphedema, not elsewhere classified: Secondary | ICD-10-CM | POA: Diagnosis not present

## 2020-10-13 DIAGNOSIS — G893 Neoplasm related pain (acute) (chronic): Secondary | ICD-10-CM

## 2020-10-13 DIAGNOSIS — Z79891 Long term (current) use of opiate analgesic: Secondary | ICD-10-CM | POA: Diagnosis not present

## 2020-10-13 DIAGNOSIS — C774 Secondary and unspecified malignant neoplasm of inguinal and lower limb lymph nodes: Secondary | ICD-10-CM

## 2020-10-13 DIAGNOSIS — I898 Other specified noninfective disorders of lymphatic vessels and lymph nodes: Secondary | ICD-10-CM

## 2020-10-13 DIAGNOSIS — R634 Abnormal weight loss: Secondary | ICD-10-CM | POA: Insufficient documentation

## 2020-10-13 MED ORDER — OXYCODONE HCL 5 MG PO TABS: 5.0000 mg | ORAL_TABLET | ORAL | 0 refills | Status: DC | PRN

## 2020-10-13 NOTE — Progress Notes (Signed)
Requested PD-L1 testing on accession [id number] with [facility] via email.

## 2020-10-13 NOTE — Progress Notes (Signed)
Follow-up Note: Gyn-Onc  Consult was requested by Dr. Dellis Filbert for the evaluation of Christina [name] 60 y.o. female  CC:  Chief Complaint  Patient presents with   Squamous cell carcinoma of vulva [facility])    Assessment/Plan:  [name]  is a 60 y.o.  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  S/p bilateral inguinal dissection/debulking and s/p definitive radiation to the vulva and pelvis and inguinal nodes (completed 10/02/20).   I am concerned on examination that she has had an incomplete clinical response at the vulva to therapy.   Given that she has only just completed radiation, we will wait 4 more weeks and re-inspect. We will also obtain a PET scan prior to that evaluation to rule out development of distant metastatic disease, particularly in upper station nodes.  I explained to [facility] that if there remains persistent disease in 1 month time, it is not realistic to expect complete regression following radiation.  I explained that if that is the case there would be 2 options.  The only curative option would be for total pelvic exenteration and explained what this procedure would entail, its potential morbidity and mortality risk, and the 50% likelihood of cure.  I explained that total pelvic exenteration would only be an option if imaging revealed no evidence of disease outside of the vulva.  And I explained to the patient that given her nonresponsive disease, she is a less good candidate for exenteration which typically achieves the best outcomes in patients with a long disease-free interval.  The second option that would remain would be palliative and this would be systemic therapy with either chemotherapy or targeted agent.  We will test her tumor for PD-L1 to see if she is a candidate for pembrolizumab.  I counseled the patient regarding pain control options.  It seems that she needs 15 mg of  oxycodone to optimize her pain control.  I have prescribed 5 mg of oxycodone to add to the 10 mg tablets that she was previously prescribed.  He engaged in counseling regarding prognosis, end-of-life planning, and symptom management.   HPI: [name] is a 60 year old P4 who was seen in consultation at the request of Dr Dellis Filbert for evaluation of a vulvar cancer.  The patient began experiencing vulvar burning after her knee replacement in March 2022.  She mentioned this to her primary care physician who looked at it and was unclear about the diagnosis but recommended consultation with a gynecologist.  The patient did not have an established gynecologist and therefore made a new patient consultation with Dr. Dellis Filbert.  Dr. Dellis Filbert saw the patient on 06/16/2020 which revealed a squamous cell carcinoma of the vulva.  The tumor was well differentiated and keratinizing.  Her primary vulvar cancer was not felt to be amenable to primary resection as it was a 6cm lesion replacing the perineal body and adherent with the anal sphincter.  A staging CT scan on 06/30/20 showed no gross metastatic disease (no lymphadenopathy).  She was taken to the operating room on 07/11/20 for bilateral inguinofemoral lymphadenectomy to surgically evaluate the groins prior to proceeding with radiation to the vulva.  Final pathology from bilateral inguinofemoral lymphadenectomy revealed positive lymph nodes for metastatic squamous cell carcinoma in 1 of 3 right inguinal lymph nodes and 2/4 left inguinal lymph nodes.  The size of the metastases were 5.5 mm and 11 mm.  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 Recommended therapy was primary radiation to the vulva with radiation to the groins and pelvis.  Plan would be for repeat biopsy of the vulva 6-12 weeks after completing radiation to confirm complete pathologic response. If there was a complete pathologic response, no further therapy would be necessary,  however if there was gross or macroscopic residual disease, she would require a resection.   She developed a left inguinal lymphocyst postoperatively.  She received a pre-treatment PET scan on 08/08/2020 prior to commencing radiation therapy.  This revealed negative chest, intense activity in the location of her vulvar tumor.  There was evidence of enlarged hypermetabolic left inguinal lymph nodes adjacent to a biopsy clip.  It measured 17 mm in greatest dimension.  On the right side there was a 10 mm mildly PET avid inguinal lymph node adjacent to a biopsy clip.  There were no upper station lymphadenopathy PET avidity in the pelvic, iliac, or aortic chains.  There was no signs of visceral metastases.  She went on to receive definitive radiation to the vulva (perineum), inguinal lymph node region and pelvic basin.  Treatment dates were between 08/03/2020 through 10/02/2020.  Please review radiation oncology notes for specifics regarding location of and dosing.  Interval Hx:  She tolerated therapy fairly well and initially noted response that was dramatic at the vulva with significant improvement of symptoms of vulvar irritation.  However after completing radiation she began to experience regrowth of tumor at the perineum that was irritated and burning.  Additionally she noted that her left inguinal lymphocyst had become enlarged and somewhat firm.    Pain control is a problem and she was prescribed oxycodone 10 mg as needed finding that she needed 15 mg to achieve better control.  She will take this dose approximately twice a day.  She continues to have normal bowel habit.  She was evaluated for lower extremity lymphedema with treatment prescribed by PT. Current Meds:  Outpatient Encounter Medications as of 10/13/2020  Medication Sig   cetirizine (ZYRTEC) 10 MG tablet TAKE 1 TO 2 TABLETS BY MOUTH DAILY AS NEEDED (Patient taking differently: Take 10 mg by mouth daily.)   Cholecalciferol (VITAMIN D) 50 MCG  (2000 UT) tablet Take 2,000 Units by mouth daily.   ibuprofen (ADVIL) 600 MG tablet Take 1 tablet (600 mg total) by mouth every 6 (six) hours as needed for moderate pain.   ipratropium (ATROVENT) 0.06 % nasal spray USE 2 SPRAYS IN EACH NOSTRIL THREE TIMES DAILY   levocetirizine (XYZAL) 5 MG tablet Take 1 tablet (5 mg total) by mouth every evening.   losartan (COZAAR) 50 MG tablet TAKE 1 TABLET(50 MG) BY MOUTH DAILY   ondansetron (ZOFRAN ODT) 4 MG disintegrating tablet 56m ODT q4 hours prn nausea/vomit   oxyCODONE (OXY IR/ROXICODONE) 5 MG immediate release tablet Take 1 tablet (5 mg total) by mouth every 4 (four) hours as needed for severe pain.   oxyCODONE 10 MG TABS Take 1 tablet (10 mg total) by mouth every 4 (four) hours as needed for severe pain.   oxyCODONE-acetaminophen (PERCOCET/ROXICET) 5-325 MG tablet 1 po at bedtime as needed   Probiotic Product (PROBIOTIC PO) Take 1 tablet by mouth daily.   senna-docusate (SENOKOT-S) 8.6-50 MG tablet Take 2 tablets by mouth at bedtime. For AFTER surgery, do not take if having diarrhea   acetaminophen (TYLENOL) 325 MG tablet Take 650 mg by mouth every 6 (six) hours as needed for moderate  pain. (Patient not taking: No sig reported)   cephALEXin (KEFLEX) 500 MG capsule Take 1 capsule (500 mg total) by mouth 4 (four) times daily. (Patient not taking: Reported on 10/13/2020)   diphenhydramine-acetaminophen (TYLENOL PM) 25-500 MG TABS tablet Take 2 tablets by mouth at bedtime. (Patient not taking: No sig reported)   lidocaine (XYLOCAINE) 2 % solution Apply small amount over sore lesion up to every 4 hours as needed for pain (Patient not taking: Reported on 10/13/2020)   lidocaine (XYLOCAINE) 5 % ointment Apply 1 application topically 4 (four) times daily as needed for moderate pain. (Patient not taking: Reported on 10/13/2020)   LORazepam (ATIVAN) 0.5 MG tablet Take 1 tablet (0.5 mg total) by mouth every 8 (eight) hours. As needed for nausea or anxiety (Patient not  taking: Reported on 10/13/2020)   oxyCODONE ER (XTAMPZA ER) 9 MG C12A Take 9 mg by mouth 2 (two) times daily. 12 hours apart (Patient not taking: Reported on 10/13/2020)   phenazopyridine (PYRIDIUM) 200 MG tablet Take 1 tablet (200 mg total) by mouth 3 (three) times daily as needed for pain. (Patient not taking: Reported on 10/13/2020)   No facility-administered encounter medications on file as of 10/13/2020.    Allergy: No Known Allergies  Social Hx:   Social History   Socioeconomic History   Marital status: Married    Spouse name: Not on file   Number of children: Not on file   Years of education: Not on file   Highest education level: Not on file  Occupational History   Occupation: retired  Tobacco Use   Smoking status: Former    Packs/day: 0.50    Years: 2.00    Pack years: 1.00    Types: Cigarettes   Smokeless tobacco: Never   Tobacco comments:    Smoke in 20's  Vaping Use   Vaping Use: Never used  Substance and Sexual Activity   Alcohol use: Not Currently   Drug use: No   Sexual activity: Yes    Partners: Male    Birth control/protection: Post-menopausal  Other Topics Concern   Not on file  Social History Narrative   Not on file   Social Determinants of Health   Financial Resource Strain: Not on file  Food Insecurity: Not on file  Transportation Needs: Not on file  Physical Activity: Not on file  Stress: Not on file  Social Connections: Not on file  Intimate Partner Violence: Not on file    Past Surgical Hx:  Past Surgical History:  Procedure Laterality Date   BLADDER SUSPENSION     CHOLECYSTECTOMY     COLONOSCOPY     INGUINAL LYMPHADENECTOMY Bilateral 07/11/2020   Procedure: INGUINAL LYMPHADENECTOMY;  Surgeon: Everitt Amber, MD;  Location: [facility];  Service: Gynecology;  Laterality: Bilateral;   TOTAL KNEE ARTHROPLASTY Right 04/25/2020   Procedure: RIGHT TOTAL KNEE ARTHROPLASTY;  Surgeon: Meredith Pel, MD;  Location: [facility];  Service: Orthopedics;   Laterality: Right;   WISDOM TOOTH EXTRACTION      Past Medical Hx:  Past Medical History:  Diagnosis Date   Arthritis    Chicken pox    Chronic back pain    HLD (hyperlipidemia)    diet controlled   Hypertension    Recurrent upper respiratory infection (URI)    Seasonal allergies    Vulvar cancer ([facility])     Past Gynecological History:  SVD x 4. No hx of abnormal paps. No LMP recorded. Patient is postmenopausal.  Family Hx:  Family History  Problem Relation Age of Onset   Hyperlipidemia Mother    Hypertension Mother    Stroke Mother    Breast cancer Mother    Prostate cancer Father    Hypertension Maternal Grandmother    Hypertension Paternal Grandmother    Stroke Paternal Grandmother    Allergic rhinitis Neg Hx    Asthma Neg Hx    Ovarian cancer Neg Hx    Uterine cancer Neg Hx    Colon cancer Neg Hx    Pancreatic cancer Neg Hx     Review of Systems:  Constitutional  Reports weight loss  ENT Normal appearing ears and nares bilaterally Skin/Breast  No rash, sores, jaundice, itching, dryness Cardiovascular  No chest pain, shortness of breath, or edema  Pulmonary  No cough or wheeze.  Gastro Intestinal  No nausea, vomitting, or diarrhoea. No bright red blood per rectum, no abdominal pain, change in bowel movement, or constipation.  Genito Urinary  No frequency, urgency, dysuria, + vulvar pruritis Musculo Skeletal  No myalgia, arthralgia, joint swelling or pain  Neurologic  No weakness, numbness, change in gait,  Psychology  No depression, anxiety, insomnia.   Vitals:  Blood pressure 118/78, pulse 79, temperature 99.2 F (37.3 C), temperature source Oral, resp. rate 16, height _0  (1.549 m), weight 128 lb 3.2 oz (58.2 kg), SpO2 98 %.  Physical Exam: WD in NAD Neck  Supple NROM, without any enlargements.  Lymph Node Survey 12cm (approx) firm, cystic, well circumscribed mass in the left inguinal region consistent with lymphocyst. No overlying erythema.  Minimally tender.  Cardiovascular  Well perfused peripheries.  Lungs  No increased WOB Skin  No rash/lesions/breakdown  Psychiatry  Alert and oriented to person, place, and time  Abdomen  Normoactive bowel sounds, abdomen soft, non-tender and nonobese without evidence of hernia. Back No CVA tenderness Genito Urinary  Vulva/vagina: 6x4cm oval exophytic lesion on perineal body (crossing the midline) and extending to the left (more than right). Mass is fixed to the external anal sphincter but does not involve the anal or rectal mucosa. The center is ulcerated and cavitating with a mounded/rolled edge. However the size dimensions are similar to pre-treatment.  Rectal  No mucosal involvement. The tumor involves the external component of the sphincter.  Extremities  Mild lower extremity lymphedema L > R  Thereasa Solo, MD  10/13/2020, 12:49 PM

## 2020-10-13 NOTE — Patient Instructions (Addendum)
Dr Denman George is concerned that the tumor is not responding to radiation as hoped.  She wants to give the radiation another month to take effect and will see you then for an examination. Before that visit she will order a PET.  You will receive a phone call from Interventional Radiology to schedule the drainage of the lymphocyst.  If the cancer is still present on the vulva in late September and if there is no disease seen elsewhere (including in any of the lymph nodes), then an option at that time is total pelvic exenteration ( a procedure in which your bladder, vagina and rectum with anus are all removed as well as all of the skin in between your legs, a flap of tissue is placed to fill where the skin is in between your legs, and permanent bags are created on your stomach for urine and feces collection). This procedure has a chance of death of 10% within 30 days, and a chance of cure of 50% provided that there is no disease elsewhere seen on scan.   An alternative to the radical surgery cited above would be chemotherapy or targeted therapy through the IV. This is rarely curative but may be palliative.  You can continue to use aquaphor on the vulva with Telfa applied in the crack to hold it in place.

## 2020-10-16 ENCOUNTER — Ambulatory Visit: Payer: 59 | Admitting: Orthopedic Surgery

## 2020-10-17 ENCOUNTER — Encounter: Payer: Self-pay | Admitting: Gynecologic Oncology

## 2020-10-18 ENCOUNTER — Encounter (HOSPITAL_COMMUNITY): Payer: Self-pay | Admitting: Radiology

## 2020-10-18 NOTE — Progress Notes (Signed)
Filsaime, Maliyani Steinhaus Legal Sex  Female DOB  [birth year] SSN  [id number] Address  [address]  [place] [address] Phone  [phone] [facility])  [phone] (Mobile) *Preferred*    FW: [place] FNA SOFT TISSUE Received: Today Suttle, Rosanne Ashing, MD  Garth Bigness D Please arrange for ultrasound guided left inguinal fluid collection (likely lymphocele) aspiration and drain placement.   Happy to do this at [facility] or [facility] on any of my upcoming days (backup IR included).  If not scheduled with me: recommend sending fluid for culture, triglycerides.  If lymphocele, will leave drain in place for 1-2 weeks, monitor output, then return for drain check with fluoro and possible sclerosis.   Dylan        Previous Messages   ----- Message -----  From: Everitt Amber, MD  Sent: 10/17/2020   7:04 PM EDT  To: Suzette Battiest, MD  Subject: RE: [place] FNA SOFT TISSUE                         Drainage/sclerosis sounds like it might be better for her. Thanks for suggesting.  Terrence Dupont   ----- Message -----  From: Suzette Battiest, MD  Sent: 10/16/2020   9:36 AM EDT  To: Everitt Amber, MD, Jillyn Hidden  Subject: RE: [place] FNA SOFT TISSUE                         Dr. Denman George,   Would you like simple aspiration of this lymphocele, or drainage/sclerosing by our team if needed (unlikely to resolve spontaneously if truly lymphocele)?   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

## 2020-10-19 ENCOUNTER — Encounter: Payer: Self-pay | Admitting: Gynecologic Oncology

## 2020-10-19 ENCOUNTER — Other Ambulatory Visit: Payer: Self-pay | Admitting: Radiation Oncology

## 2020-10-19 MED ORDER — PHENAZOPYRIDINE HCL 200 MG PO TABS: 200.0000 mg | ORAL_TABLET | Freq: Three times a day (TID) | ORAL | 1 refills | Status: DC | PRN

## 2020-10-20 ENCOUNTER — Telehealth: Payer: Self-pay | Admitting: Oncology

## 2020-10-20 NOTE — Telephone Encounter (Signed)
Christina [name] left a message saying that she is having a lot of pain/burning at the site of her tumor.  She has tried the long acting oxycodone but it constipates her.  She is leaving to go on vacation Sunday and wants to know if there is anything else she can try and to make sure she has enough of her pain medications.

## 2020-10-20 NOTE — Telephone Encounter (Signed)
Called Christina [name] back and she is feeling better.  She was concerned because she is going out of town for 10 days.  She thinks some of her pain was coming from being constipated.  She felt a lot better after having a bowel movement this morning.  She is taking dulcolax as needed.  Discussed trying Miralax 1-2 times a day.    Also discussed that she take the long acting Xtampza q 12 hours and the Oxy IR q 4 hours in between for breakthrough pain.  She said she has been taking the Xtampza and will continue taking it.  She said she should have enough of her medications and does not need a refill at this time.

## 2020-10-24 ENCOUNTER — Telehealth: Payer: Self-pay | Admitting: Internal Medicine

## 2020-10-24 ENCOUNTER — Other Ambulatory Visit: Payer: Self-pay | Admitting: Gynecologic Oncology

## 2020-10-24 ENCOUNTER — Telehealth: Payer: Self-pay | Admitting: Oncology

## 2020-10-24 DIAGNOSIS — C519 Malignant neoplasm of vulva, unspecified: Secondary | ICD-10-CM

## 2020-10-24 DIAGNOSIS — R300 Dysuria: Secondary | ICD-10-CM

## 2020-10-24 DIAGNOSIS — N390 Urinary tract infection, site not specified: Secondary | ICD-10-CM

## 2020-10-24 DIAGNOSIS — N9069 Other specified hypertrophy of vulva: Secondary | ICD-10-CM

## 2020-10-24 MED ORDER — CEPHALEXIN 500 MG PO CAPS: 500.0000 mg | ORAL_CAPSULE | Freq: Four times a day (QID) | ORAL | 0 refills | Status: DC

## 2020-10-24 NOTE — Telephone Encounter (Signed)
I spoke with the pt and she stated that an rx was called in for her by Oncologist. Pt stated that Cephalexin was prescribed.

## 2020-10-24 NOTE — Telephone Encounter (Signed)
Christina [name] regarding her [employer] and voicemail.  She said her symptoms are the same as she had last time she thought she had a UTI.  She was having trouble urinating until she took [place].  She is now able to urinate but is having burning.  She also threw up.  She is on her way to a family wedding on Friday.  She wants to be able to enjoy the wedding and is wondering if a prescription for cephalexin can be called in.  Advised her that we will review with Dr. Denman George and call her back.

## 2020-10-24 NOTE — Telephone Encounter (Signed)
Patient called because she has UTI and needs a prescription for antibiotic. She is currently out of state and on her way to [name] for a wedding. Patient states she is not in good shape and has pain. Wants the prescription sent to pharmacy in [name], did not specify.    Good callback number is [phone]

## 2020-10-24 NOTE — Telephone Encounter (Signed)
Christina [name] called back and was advised that a prescription for cephalexin has been sent to the [facility] as requested by [name], NP.  Also advised that [name] has sent her a MyChart message with additional information.  Christina [name] verbalized understanding and appreciation.

## 2020-10-24 NOTE — Progress Notes (Signed)
See patient mychart message. Per Dr. Denman George, plan to reorder cephalexin for UTI symptoms.

## 2020-10-25 NOTE — Telephone Encounter (Signed)
Christina [name] noted  .  Did she ever get a  urology appt regarding the frequent uti in setting of her cancer  treatments?  Can she make appt with dr Louis Meckel about these recurrent utis s  and radiation  hx .  Otherwise   have oncology advise  which specialist may be helpful in  her situation.

## 2020-10-25 NOTE — Telephone Encounter (Signed)
I spoke with the pt and she stated that she did not get urology appt in cancer treatment settings due to the office calling her late and she had more pressing concerns so she did not pursue. Pt stated that Dr. Louis Meckel was not in her network of coverage and she would like to be referred to provider in her network.

## 2020-10-25 NOTE — Telephone Encounter (Signed)
Okay thanks for checking on her   Please arrange  urology referral in her network.  Dx : Recurrent utis    Squamous cell ca of vulva  his  of radiation therapy  Thanks

## 2020-10-26 NOTE — Telephone Encounter (Signed)
I left a detailed message on the patient's cell phone voicemail informing her that a referral to  Urology has been placed and that someone should be contacting her in regards to an appt.

## 2020-10-30 ENCOUNTER — Encounter: Payer: Self-pay | Admitting: Gynecologic Oncology

## 2020-10-30 ENCOUNTER — Other Ambulatory Visit: Payer: Self-pay | Admitting: Oncology

## 2020-10-30 ENCOUNTER — Telehealth: Payer: Self-pay | Admitting: Oncology

## 2020-10-30 NOTE — Progress Notes (Signed)
Gynecologic Oncology Multi-Disciplinary Disposition Conference Note  Date of the Conference: 10/30/2020  Patient Name: Christina [name]  Referring Provider: Dr. Dellis Filbert Primary GYN Oncologist: Dr. 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 Disposition is for a PET scan followed by salvage therapy with carboplatin/Taxol or cisplatin versus pembrolizumab.  Patient is not a candidate for surgery (total pelvic exenteration) due to patient preference and  nonresponsive disease.  This Multidisciplinary conference took place involving physicians from [place], [place], Radiation Oncology, Pathology, Radiology along with the Gynecologic Oncology Nurse Practitioner and RN.  Comprehensive assessment of the patient's malignancy, staging, need for surgery, chemotherapy, radiation therapy, and need for further testing were reviewed. Supportive measures, both inpatient and following discharge were also discussed. The recommended plan of care is documented. Greater than 35 minutes were spent correlating and coordinating this patient's care.

## 2020-10-30 NOTE — Telephone Encounter (Signed)
Left a message to discuss MyChart message.  Requested a return call.

## 2020-10-31 ENCOUNTER — Other Ambulatory Visit: Payer: Self-pay | Admitting: Gynecologic Oncology

## 2020-10-31 ENCOUNTER — Other Ambulatory Visit: Payer: Self-pay | Admitting: Internal Medicine

## 2020-10-31 ENCOUNTER — Encounter: Payer: Self-pay | Admitting: Oncology

## 2020-10-31 DIAGNOSIS — C519 Malignant neoplasm of vulva, unspecified: Secondary | ICD-10-CM

## 2020-10-31 DIAGNOSIS — C774 Secondary and unspecified malignant neoplasm of inguinal and lower limb lymph nodes: Secondary | ICD-10-CM

## 2020-10-31 DIAGNOSIS — I898 Other specified noninfective disorders of lymphatic vessels and lymph nodes: Secondary | ICD-10-CM

## 2020-10-31 MED ORDER — OXYCODONE HCL 5 MG PO TABS: 5.0000 mg | ORAL_TABLET | ORAL | 0 refills | Status: DC | PRN

## 2020-10-31 NOTE — Progress Notes (Signed)
See RN navigator note. Pt requesting refill on pain medication. Unable to tolerate long acting. Per Dr. Denman George, patient can increase oxycodone use to every two hours as needed for severe pain related to cancer.

## 2020-10-31 NOTE — Telephone Encounter (Signed)
Christina [name] called back and said she has noticed that the swelling in her left side is not as hard as it was.  She is wondering if she still needs the procedure on Thursday.  Advised her that she should go to the procedure and that I will notify [name], NP.

## 2020-10-31 NOTE — Telephone Encounter (Signed)
[place] regarding her [employer].  Advised her that Dr. Denman George is recommending stopping the long acting oxycodone and taking the short acting oxycodone every 2 hours to see if that helps her pain.    Also advised her that radiation can't be done again and that Dr. Denman George will discuss other treatment options with her at her appointment on 11/08/20.    She verbalized agreement and requested a refill on the 5 mg oxy IR to be sent to the [facility] on [address].  She only has a few let.    She also mentioned she saw urology yesterday and they are having her start a new medication and she will let us know what it is.

## 2020-11-01 ENCOUNTER — Ambulatory Visit: Payer: 59 | Admitting: Orthopedic Surgery

## 2020-11-01 ENCOUNTER — Other Ambulatory Visit: Payer: Self-pay | Admitting: Radiology

## 2020-11-01 ENCOUNTER — Telehealth: Payer: Self-pay | Admitting: Oncology

## 2020-11-01 ENCOUNTER — Encounter: Payer: Self-pay | Admitting: Radiation Oncology

## 2020-11-01 NOTE — Telephone Encounter (Signed)
Called Christina [name] and advised her to keep her appointment with IR tomorrow even though the area is not as hard as it was.  They will evaluate the area and discuss it with her.  Also reviewed instructions for NPO after 7 am with her.

## 2020-11-02 ENCOUNTER — Other Ambulatory Visit: Payer: Self-pay

## 2020-11-02 ENCOUNTER — Ambulatory Visit (HOSPITAL_COMMUNITY)
Admission: RE | Admit: 2020-11-02 | Discharge: 2020-11-02 | Disposition: A | Discharge status: Home / Self Care | Payer: 59 | Source: Ambulatory Visit | Attending: Gynecologic Oncology | Admitting: Gynecologic Oncology

## 2020-11-02 DIAGNOSIS — Z87891 Personal history of nicotine dependence: Secondary | ICD-10-CM | POA: Diagnosis not present

## 2020-11-02 DIAGNOSIS — C774 Secondary and unspecified malignant neoplasm of inguinal and lower limb lymph nodes: Secondary | ICD-10-CM | POA: Diagnosis not present

## 2020-11-02 DIAGNOSIS — I898 Other specified noninfective disorders of lymphatic vessels and lymph nodes: Secondary | ICD-10-CM | POA: Diagnosis not present

## 2020-11-02 DIAGNOSIS — C519 Malignant neoplasm of vulva, unspecified: Secondary | ICD-10-CM | POA: Diagnosis not present

## 2020-11-02 LAB — CBC
HCT: 35 % — ABNORMAL LOW (ref 36.0–46.0)
Hemoglobin: 11.1 g/dL — ABNORMAL LOW (ref 12.0–15.0)
MCH: 31.1 pg (ref 26.0–34.0)
MCHC: 31.7 g/dL (ref 30.0–36.0)
MCV: 98 fL (ref 80.0–100.0)
Platelets: 424 10*3/uL — ABNORMAL HIGH (ref 150–400)
RBC: 3.57 MIL/uL — ABNORMAL LOW (ref 3.87–5.11)
RDW: 13.4 % (ref 11.5–15.5)
WBC: 11.6 10*3/uL — ABNORMAL HIGH (ref 4.0–10.5)
nRBC: 0 % (ref 0.0–0.2)

## 2020-11-02 LAB — PROTIME-INR
INR: 1 (ref 0.8–1.2)
Prothrombin Time: 13.4 seconds (ref 11.4–15.2)

## 2020-11-02 MED ORDER — FENTANYL CITRATE (PF) 100 MCG/2ML IJ SOLN
INTRAMUSCULAR | Status: DC | PRN
  Administered 2020-11-02 (×3): 25 ug via INTRAVENOUS

## 2020-11-02 MED ORDER — FENTANYL CITRATE (PF) 100 MCG/2ML IJ SOLN: 25.0000 ug | Freq: Once | INTRAMUSCULAR | Status: DC

## 2020-11-02 MED ORDER — SODIUM CHLORIDE 0.9 % IV SOLN: INTRAVENOUS | Status: DC

## 2020-11-02 MED ORDER — LIDOCAINE HCL (PF) 1 % IJ SOLN
INTRAMUSCULAR | Status: AC
  Filled 2020-11-02: qty 30

## 2020-11-02 MED ORDER — HYDROCODONE-ACETAMINOPHEN 5-325 MG PO TABS: 1.0000 | ORAL_TABLET | ORAL | Status: DC | PRN

## 2020-11-02 MED ORDER — FENTANYL CITRATE (PF) 100 MCG/2ML IJ SOLN
INTRAMUSCULAR | Status: AC
  Filled 2020-11-02: qty 2

## 2020-11-02 MED ORDER — MIDAZOLAM HCL 2 MG/2ML IJ SOLN
INTRAMUSCULAR | Status: AC
  Filled 2020-11-02: qty 2

## 2020-11-02 MED ORDER — MIDAZOLAM HCL 2 MG/2ML IJ SOLN
INTRAMUSCULAR | Status: DC | PRN
  Administered 2020-11-02: .5 mg via INTRAVENOUS
  Administered 2020-11-02: 1 mg via INTRAVENOUS

## 2020-11-02 NOTE — H&P (Signed)
Chief Complaint: Patient was seen in consultation today for left inguinal lymphocyst aspiration  Referring Physician(s): Rossi,Emma  Supervising Physician: Arne Cleveland  Patient Status: [facility] - Out-pt  History of Present Illness: Christina [name] is a 60 y.o. female with a medical history significant for vulvar cancer s/p bilateral inguinofemoral lymphadenectomy 07/11/20 and primary radiation to the vulva, groins and pelvis. During a recent visit with her oncologist she was noted to have a mass in the left inguinal region consistent with a lymphocyst.   Interventional Radiology has been asked to evaluate this patient for an image-guided lymphocyst aspiration. This case was reviewed and procedure approved by Dr. Serafina Royals.   Past Medical History:  Diagnosis Date   Arthritis    Chicken pox    Chronic back pain    History of radiation therapy    Pelvis 08/03/20-10/02/20- Dr. Gery Pray   HLD (hyperlipidemia)    diet controlled   Hypertension    Recurrent upper respiratory infection (URI)    Seasonal allergies    Vulvar cancer [facility])     Past Surgical History:  Procedure Laterality Date   BLADDER SUSPENSION     CHOLECYSTECTOMY     COLONOSCOPY     INGUINAL LYMPHADENECTOMY Bilateral 07/11/2020   Procedure: INGUINAL LYMPHADENECTOMY;  Surgeon: Everitt Amber, MD;  Location: [facility];  Service: Gynecology;  Laterality: Bilateral;   TOTAL KNEE ARTHROPLASTY Right 04/25/2020   Procedure: RIGHT TOTAL KNEE ARTHROPLASTY;  Surgeon: Meredith Pel, MD;  Location: [place];  Service: Orthopedics;  Laterality: Right;   WISDOM TOOTH EXTRACTION      Allergies: Patient has no known allergies.  Medications: Prior to Admission medications   Medication Sig Start Date End Date Taking? Authorizing Provider  Barberry-Oreg Grape-Goldenseal (BERBERINE COMPLEX PO) Take 1 capsule by mouth daily.   Yes [provider]  cetirizine (ZYRTEC) 10 MG tablet TAKE 1 TO 2 TABLETS BY MOUTH DAILY AS  NEEDED Patient taking differently: Take 10 mg by mouth daily. 03/30/20  Yes Valentina Shaggy, MD  Cholecalciferol (VITAMIN D) 50 MCG (2000 UT) tablet Take 2,000 Units by mouth daily.   Yes [provider]  doxylamine, Sleep, (UNISOM) 25 MG tablet Take 25 mg by mouth at bedtime as needed for sleep.   Yes [provider]  Emollient (AQUAPHOR EX) Apply 1 application topically daily.   Yes [provider]  ibuprofen (ADVIL) 600 MG tablet Take 1 tablet (600 mg total) by mouth every 6 (six) hours as needed for moderate pain. 10/03/20  Yes Gery Pray, MD  ipratropium (ATROVENT) 0.06 % nasal spray USE 2 SPRAYS IN EACH NOSTRIL THREE TIMES DAILY Patient taking differently: Place 2 sprays into both nostrils 3 (three) times daily as needed for rhinitis. 08/04/20  Yes Valentina Shaggy, MD  levocetirizine (XYZAL) 5 MG tablet Take 1 tablet (5 mg total) by mouth every evening. Patient taking differently: Take 5 mg by mouth daily as needed for allergies. 04/02/19  Yes Valentina Shaggy, MD  losartan (COZAAR) 50 MG tablet TAKE 1 TABLET(50 MG) BY MOUTH DAILY 09/19/20  Yes Panosh, Standley Brooking, MD  Probiotic Product (PROBIOTIC PO) Take 1 tablet by mouth daily.   Yes [provider]  TURMERIC CURCUMIN PO Take 300 mg by mouth daily.   Yes [provider]  vitamin E 180 MG (400 UNITS) capsule Take 400 Units by mouth daily.   Yes [provider]  cephALEXin (KEFLEX) 500 MG capsule Take 1 capsule (500 mg total) by mouth  4 (four) times daily. Patient not taking: No sig reported 10/24/20   [name] D, NP  lidocaine (XYLOCAINE) 2 % solution Apply small amount over sore lesion up to every 4 hours as needed for pain Patient not taking: No sig reported 08/17/20   Gery Pray, MD  lidocaine (XYLOCAINE) 5 % ointment Apply 1 application topically 4 (four) times daily as needed for moderate pain. Patient not taking: No sig reported 08/22/20   Gery Pray, MD   LORazepam (ATIVAN) 0.5 MG tablet Take 1 tablet (0.5 mg total) by mouth every 8 (eight) hours. As needed for nausea or anxiety Patient not taking: No sig reported 07/03/20   Gery Pray, MD  ondansetron (ZOFRAN ODT) 4 MG disintegrating tablet '4mg'$  ODT q4 hours prn nausea/vomit Patient not taking: No sig reported 09/26/20   Eppie Gibson, MD  oxyCODONE (OXY IR/ROXICODONE) 5 MG immediate release tablet Take 1 tablet (5 mg total) by mouth every 2 (two) hours as needed for severe pain. For cancer related pain, Do not take and drive [id number]   [name] D, NP  oxyCODONE ER (XTAMPZA ER) 9 MG C12A Take 9 mg by mouth 2 (two) times daily. 12 hours apart Patient not taking: No sig reported 08/29/20   Gery Pray, MD  phenazopyridine (PYRIDIUM) 200 MG tablet Take 1 tablet (200 mg total) by mouth 3 (three) times daily as needed for pain. 10/19/20   Gery Pray, MD  senna-docusate (SENOKOT-S) 8.6-50 MG tablet Take 2 tablets by mouth at bedtime. For AFTER surgery, do not take if having diarrhea Patient not taking: No sig reported 07/07/20   Dorothyann Gibbs, NP     Family History  Problem Relation Age of Onset   Hyperlipidemia Mother    Hypertension Mother    Stroke Mother    Breast cancer Mother    Prostate cancer Father    Hypertension Maternal Grandmother    Hypertension Paternal Grandmother    Stroke Paternal Grandmother    Allergic rhinitis Neg Hx    Asthma Neg Hx    Ovarian cancer Neg Hx    Uterine cancer Neg Hx    Colon cancer Neg Hx    Pancreatic cancer Neg Hx     Social History   Socioeconomic History   Marital status: Married    Spouse name: Not on file   Number of children: Not on file   Years of education: Not on file   Highest education level: Not on file  Occupational History   Occupation: retired  Tobacco Use   Smoking status: Former    Packs/day: 0.50    Years: 2.00    Pack years: 1.00    Types: Cigarettes   Smokeless tobacco: Never   Tobacco comments:    Smoke in  20's  Vaping Use   Vaping Use: Never used  Substance and Sexual Activity   Alcohol use: Not Currently   Drug use: No   Sexual activity: Yes    Partners: Male    Birth control/protection: Post-menopausal  Other Topics Concern   Not on file  Social History Narrative   Not on file   Social Determinants of Health   Financial Resource Strain: Not on file  Food Insecurity: Not on file  Transportation Needs: Not on file  Physical Activity: Not on file  Stress: Not on file  Social Connections: Not on file    Review of Systems: A 12 point ROS discussed and pertinent positives are indicated in the HPI above.  All other systems are negative.  Review of Systems  Constitutional:  Positive for fatigue. Negative for appetite change.  Respiratory:  Negative for cough and shortness of breath.   Cardiovascular:  Negative for chest pain and leg swelling.  Gastrointestinal:  Positive for rectal pain. Negative for abdominal pain, diarrhea, nausea and vomiting.  Genitourinary:  Positive for vaginal pain.  Neurological:  Negative for headaches.  Psychiatric/Behavioral:  The patient is nervous/anxious.    Vital Signs: BP (!) 153/82 (BP Location: Right Arm)   Pulse 77   Temp 98.4 F (36.9 C) (Oral)   Ht '5\' 1"'$  (1.549 m)   Wt 125 lb (56.7 kg)   SpO2 99%   BMI 23.62 kg/m   Physical Exam HENT:     Mouth/Throat:     Mouth: Mucous membranes are moist.     Pharynx: Oropharynx is clear.  Cardiovascular:     Rate and Rhythm: Normal rate and regular rhythm.  Pulmonary:     Effort: Pulmonary effort is normal.     Breath sounds: Normal breath sounds.  Abdominal:     General: Bowel sounds are normal.     Palpations: Abdomen is soft.     Tenderness: There is no abdominal tenderness.  Musculoskeletal:     Right lower leg: No edema.     Left lower leg: No edema.  Lymphadenopathy:     Comments: Large left groin mass. Area is non-tender and moderately firm.   Skin:    General: Skin is warm and  dry.  Neurological:     Mental Status: She is alert and oriented to person, place, and time.  Psychiatric:        Mood and Affect: Affect is tearful.    Imaging: No results found.  Labs:  CBC: Recent Labs    04/20/20 0946 07/07/20 1121 09/12/20 2141  WBC 7.5 8.5 5.5  HGB 14.4 13.1 10.6*  HCT 42.5 41.2 33.2*  PLT 409* 434* 384    COAGS: No results for input(s): INR, APTT in the last 8760 hours.  BMP: Recent Labs    04/20/20 0946 06/29/20 0954 07/07/20 1121 09/12/20 2141  NA 138 140 138 138  K 3.8 4.0 3.7 3.7  CL 103 103 104 103  CO2 '26 26 26 28  '$ GLUCOSE 98 84 98 113*  BUN '12 12 14 10  '$ CALCIUM 9.4 9.4 9.6 9.1  CREATININE 0.77 0.77 0.71 0.63  GFRNONAA >60 >60 >60 >60    LIVER FUNCTION TESTS: Recent Labs    07/07/20 1121 09/12/20 2141  BILITOT 0.6 1.3*  AST 20 13*  ALT 21 12  ALKPHOS 49 43  PROT 7.3 6.6  ALBUMIN 4.3 3.9    TUMOR MARKERS: No results for input(s): AFPTM, CEA, CA199, CHROMGRNA in the last 8760 hours.  Assessment and Plan:  Vulvar cancer s/p bilateral inguinofemoral lymphadenectomy now with left groin lymphocyst: Christina [name], 12/07/1960, presents today to the Interventional Radiology department for an image-guided lymphocyst aspiration. She was assessed in Short Stay and found to be crying and complaining of 10/10 vaginal/rectal pain. 25 mcg IV fentanyl ordered.   Risks and benefits discussed with the patient including bleeding, infection, damage to adjacent structures and sepsis.  All of the patient's questions were answered, patient is agreeable to proceed.  Consent signed and in chart. She has been NPO.   Thank you for this interesting consult.  I greatly enjoyed meeting Christina [name] and look forward to participating in their care.  A copy  of this report was sent to the requesting provider on this date.  Electronically Signed: Soyla Dryer, AGACNP-BC [phone] 11/02/2020, 12:48 PM   I spent a total of  30 Minutes   in face to  face in clinical consultation, greater than 50% of which was counseling/coordinating care for inguinal lymphocyst aspiration.

## 2020-11-02 NOTE — Progress Notes (Signed)
Client c/o pain and PA notified and order noted

## 2020-11-02 NOTE — Procedures (Signed)
  Procedure: US aspiration L groin collection 130m pale yellow clear fluid EBL:   minimal Complications:  none immediate  See full dictation in [facility]  DDillard CannonMD Main # [phone]  [phone]

## 2020-11-03 ENCOUNTER — Ambulatory Visit (HOSPITAL_COMMUNITY)
Admission: RE | Admit: 2020-11-03 | Discharge: 2020-11-03 | Disposition: A | Discharge status: Home / Self Care | Payer: 59 | Source: Ambulatory Visit | Attending: Gynecologic Oncology | Admitting: Gynecologic Oncology

## 2020-11-03 ENCOUNTER — Encounter: Payer: Self-pay | Admitting: Gynecologic Oncology

## 2020-11-03 DIAGNOSIS — C774 Secondary and unspecified malignant neoplasm of inguinal and lower limb lymph nodes: Secondary | ICD-10-CM

## 2020-11-03 DIAGNOSIS — C519 Malignant neoplasm of vulva, unspecified: Secondary | ICD-10-CM

## 2020-11-03 LAB — GLUCOSE, CAPILLARY: Glucose-Capillary: 117 mg/dL — ABNORMAL HIGH (ref 70–99)

## 2020-11-03 MED ORDER — FLUDEOXYGLUCOSE F - 18 (FDG) INJECTION
6.2800 | Freq: Once | INTRAVENOUS | Status: AC | PRN
  Administered 2020-11-03: 6.28 via INTRAVENOUS

## 2020-11-04 ENCOUNTER — Encounter: Payer: Self-pay | Admitting: Gynecologic Oncology

## 2020-11-05 ENCOUNTER — Encounter: Payer: Self-pay | Admitting: Physical Medicine and Rehabilitation

## 2020-11-05 NOTE — Progress Notes (Signed)
Christina [name] - 60 y.o. female MRN [id number]  Date of birth: 10-14-1960  Office Visit Note: Visit Date: 06/13/2020 PCP: Burnis Medin, MD Referred by: Burnis Medin, MD  Subjective: Chief Complaint  Patient presents with   Lower Back - Pain   HPI: Christina [name] is a 60 y.o. female who comes in today At the request of Dr. Anderson Malta for evaluation and management of chronic severe at times low back pain with some history of left radicular pain intermittently.  Her current pain level averages 2-3 out of 10 and is significantly better after radiofrequency ablation of the L4-5 and L5-S1 facet joints in January 2022.  Her pain is worse with standing and going from sit to stand and flexion to extension pretty consistent with facet type pain.  She does okay now with home exercise and medication.  She is not having any radicular complaints down the legs or paresthesias.  No focal weakness no bowel or bladder difficulty that is new.  She has been having some ongoing issues that she is looking at with her urology and gynecologist.  Recent MRI has been performed by Dr. Marlou Sa.  This is reviewed below in the notes and reviewed with the patient today with spine models and imaging.  This shows mainly facet arthropathy at L4-5 with very small listhesis and no high-grade stenosis.  She has facet arthropathy at L5-S1 as well.  There is a small central disc protrusion more to the left that could irritate the L5 nerve root at times but no compression.  Briefly her history is that she was having more radicular type complaints as far back as 2020 and was seeing Dr. Hulan Saas from an family practice and sports medicine standpoint.  She did well with initial epidural injections.  Those injections were repeated in 2021 without really much benefit.  Those were completed at the [facility] imaging center.  Those were L4-5 interlaminar injections.  Because of the more radicular nature she began having she was referred to  Dr. Phylliss Bob at [facility].  Dr. Lynann Bologna subsequently referred her to Dr. Normajean Glasgow in his office.  At that point she was having more axial back pain with still more left than right but no real referral down the leg or paresthesia.  It was felt that this may be facet mediated pain.  She went on to have diagnostic medial branch blocks at L3, L4 and L5 medial branches to effectively block the L4-5 and L5-S1 facet joints.  These were done in a double block paradigm.  These were done on 01/06/2020 and 01/25/2020.  She had undergone extensive physical therapy for a couple years as well during this time.  She had also tried medication management.  Ultimately radiofrequency ablation was performed of the same medial branch nerves.  This was 02/25/2020.  She reports getting really good relief with that ablation and doing well since then.  She did want to see if she could maintain care in the same facility along with Dr. Marlou Sa and Dr. Marlou Sa refer her to [place].  She did have a right total knee replacement by Dr. Marlou Sa in March of this year.  Review of Systems  Musculoskeletal:  Positive for back pain and joint pain.  All other systems reviewed and are negative. Otherwise per HPI.  Assessment & Plan: Visit Diagnoses:    ICD-10-CM   1. Spondylosis without myelopathy or radiculopathy, lumbar region  M47.816     2. Chronic  bilateral low back pain without sciatica  M54.50    G89.29     3. Pain in left hip  M25.552     4. Protrusion of lumbar intervertebral disc  M51.26        Plan: Findings:  History of chronic axial low back pain consistent with facet mediated pain and she did undergo double diagnostic medial branch blocks which are the gold standard for diagnosing facet mediated pain.  Subsequently she underwent ablation of the same medial branch nerves by Dr. Normajean Glasgow and did extremely well.  Typically those will last close to a year or so.  The last time this was performed was in January.  We will  continue to monitor in that regard would be happy to repeat those if we needed to.  She has had some level of off-and-on radicular type pain.  MRI that was done recently that showed very small paracentral disc at L4-5 that could irritate the L5 nerve root at times.  No frank compression etc.  No radicular complaints today.  We will continue to monitor her with her back pain.  She will continue to do home exercises and core strengthening.  All the notes from Dr. Lynann Bologna and Dr. Normajean Glasgow are in the chart and those were reviewed.   Meds & Orders: No orders of the defined types were placed in this encounter.  No orders of the defined types were placed in this encounter.   Follow-up: Return if symptoms worsen or fail to improve.   Procedures: No procedures performed      Clinical History: MRI LUMBAR SPINE WITHOUT CONTRAST   TECHNIQUE: Multiplanar, multisequence MR imaging of the lumbar spine was performed. No intravenous contrast was administered.   COMPARISON:  03/17/2018 and prior.   FINDINGS: Segmentation:  Standard.   Alignment:  Stepwise grade 1 anterolisthesis at L4-5, L5-S1 level.   Vertebrae: Minimal Modic type 2 endplate degenerative changes. No fracture or aggressive osseous lesion.   Conus medullaris and cauda equina: Conus extends to the L1 level. Conus and cauda equina appear normal.   Disc levels: Multilevel desiccation.   L1-2: No significant disc bulge. Patent spinal canal and neural foramen.   L2-3: No significant disc bulge. Patent spinal canal and neural foramen.   L3-4: Mild disc bulge.  Patent spinal canal and neural foramen.   L4-5: Grade 1 anterolisthesis with uncovered bulge grazing the descending left L5 nerve root. Shallow left foraminal protrusion. Bilateral facet hypertrophy. Mild spinal canal and bilateral neural foraminal narrowing.   L5-S1: Disc bulge and left predominant facet hypertrophy. Patent spinal canal. Mild bilateral neural foraminal  narrowing.   Paraspinal and other soft tissues: Negative.   IMPRESSION: L4-5 bulge abutting the descending left L5 nerve root with mild spinal canal and bilateral neural foraminal narrowing, grossly unchanged.   Mild bilateral L5-S1 neural foraminal narrowing.     Electronically Signed   By: Primitivo Gauze M.D.   On: 05/16/2020 11:02   She reports that she has quit smoking. Her smoking use included cigarettes. She has a 1.00 pack-year smoking history. She has never used smokeless tobacco. No results for input(s): HGBA1C, LABURIC in the last 8760 hours.  Objective:  VS:  HT:    WT:   BMI:     BP:137/88  HR:83bpm  TEMP: ( )  RESP:  Physical Exam Vitals and nursing note reviewed.  Constitutional:      General: She is not in acute distress.    Appearance: Normal appearance.  She is not ill-appearing.  HENT:     Head: Normocephalic and atraumatic.     Right Ear: External ear normal.     Left Ear: External ear normal.  Eyes:     Extraocular Movements: Extraocular movements intact.  Cardiovascular:     Rate and Rhythm: Normal rate.     Pulses: Normal pulses.  Pulmonary:     Effort: Pulmonary effort is normal. No respiratory distress.  Abdominal:     General: There is no distension.     Palpations: Abdomen is soft.  Musculoskeletal:        General: Tenderness present.     Cervical back: Neck supple.     Right lower leg: No edema.     Left lower leg: No edema.     Comments: Patient has good distal strength with no pain over the greater trochanters.  No clonus or focal weakness. Patient somewhat slow to rise from a seated position to full extension.  There is concordant low back pain with facet loading and lumbar spine extension rotation.  There are no definitive trigger points but the patient is somewhat tender across the lower back and PSIS.  There is no pain with hip rotation.   Skin:    Findings: No erythema, lesion or rash.  Neurological:     General: No focal  deficit present.     Mental Status: She is alert and oriented to person, place, and time.     Sensory: No sensory deficit.     Motor: No weakness or abnormal muscle tone.     Coordination: Coordination normal.  Psychiatric:        Mood and Affect: Mood normal.        Behavior: Behavior normal.    Ortho Exam  Imaging: No results found.  Past Medical/Family/Surgical/Social History: Medications & Allergies reviewed per EMR, new medications updated. 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   Arthritis of right knee    S/P total knee arthroplasty, 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   Degenerative arthritis of knee, bilateral 12/31/2019   Patellofemoral arthritis of left knee 03/10/2019   Acute medial meniscus tear of left knee 11/19/2018   Sprain of medial collateral ligament of left knee 09/01/2018   Neck strain, initial encounter 08/28/2018   Degenerative disc disease, cervical 08/28/2018   Left lumbar radiculopathy 03/05/2018   Right lateral epicondylitis 10/30/2017   SI (sacroiliac) joint dysfunction 10/30/2017   Nonallopathic lesion of sacral region 10/30/2017   Nonallopathic lesion of lumbar region 10/30/2017   Nonallopathic lesion of thoracic region 10/30/2017   History of recurrent UTIs 04/11/2017   Hypertension, essential, benign 10/29/2016   Allergic rhinitis 10/29/2016   Past Medical History:  Diagnosis Date   Arthritis    Chicken pox    Chronic back pain    History of radiation therapy    Pelvis 08/03/20-10/02/20- Dr. Gery Pray   HLD (hyperlipidemia)    diet controlled   Hypertension    Recurrent upper respiratory infection (URI)    Seasonal allergies    Vulvar cancer ([place])    Family History  Problem Relation Age of Onset   Hyperlipidemia Mother    Hypertension  Mother    Stroke Mother    Breast cancer Mother    Prostate cancer Father    Hypertension  Maternal Grandmother    Hypertension Paternal Grandmother    Stroke Paternal Grandmother    Allergic rhinitis Neg Hx    Asthma Neg Hx    Ovarian cancer Neg Hx    Uterine cancer Neg Hx    Colon cancer Neg Hx    Pancreatic cancer Neg Hx    Past Surgical History:  Procedure Laterality Date   BLADDER SUSPENSION     CHOLECYSTECTOMY     COLONOSCOPY     INGUINAL LYMPHADENECTOMY Bilateral 07/11/2020   Procedure: INGUINAL LYMPHADENECTOMY;  Surgeon: Everitt Amber, MD;  Location: [facility];  Service: Gynecology;  Laterality: Bilateral;   TOTAL KNEE ARTHROPLASTY Right 04/25/2020   Procedure: RIGHT TOTAL KNEE ARTHROPLASTY;  Surgeon: Meredith Pel, MD;  Location: [facility];  Service: Orthopedics;  Laterality: Right;   WISDOM TOOTH EXTRACTION     Social History   Occupational History   Occupation: retired  Tobacco Use   Smoking status: Former    Packs/day: 0.50    Years: 2.00    Pack years: 1.00    Types: Cigarettes   Smokeless tobacco: Never   Tobacco comments:    Smoke in 20's  Vaping Use   Vaping Use: Never used  Substance and Sexual Activity   Alcohol use: Not Currently   Drug use: No   Sexual activity: Yes    Partners: Male    Birth control/protection: Post-menopausal

## 2020-11-06 ENCOUNTER — Telehealth: Payer: Self-pay | Admitting: Oncology

## 2020-11-06 LAB — CYTOLOGY - NON PAP

## 2020-11-06 NOTE — Telephone Encounter (Signed)
Called Christina [name] and advised her that Christina [name] and Christina [name] are in the [place] today.  Christina [name] will review her message with Christina [name].  Christina [name] verbalized understanding and said she is willing to try anything to get her pain under control.  She is currently taking gabapentin 300 mg TID, Oxy IR 5 mg about every 2 hours (can sometimes can be longer than 2 hours) and ibuprofen 4 200 mg tablets four times a day.  She is wondering if a pain doctor would help.

## 2020-11-07 ENCOUNTER — Telehealth: Payer: Self-pay | Admitting: Oncology

## 2020-11-07 DIAGNOSIS — C519 Malignant neoplasm of vulva, unspecified: Secondary | ICD-10-CM

## 2020-11-07 NOTE — Telephone Encounter (Signed)
Received a message from Juliann Pulse, Therapist, sports at [employer].  She has been working with [facility] to get a medication approved from her urologist.  She is wondering if we could provide a palliative care referral to help with her vulvar cancer symptoms.  She is requested a return call at [phone] x [phone].

## 2020-11-08 ENCOUNTER — Encounter: Payer: Self-pay | Admitting: Gynecologic Oncology

## 2020-11-08 ENCOUNTER — Ambulatory Visit: Payer: 59 | Attending: Gynecologic Oncology | Admitting: Rehabilitation

## 2020-11-08 ENCOUNTER — Encounter: Payer: Self-pay | Admitting: Rehabilitation

## 2020-11-08 ENCOUNTER — Telehealth: Payer: Self-pay | Admitting: Oncology

## 2020-11-08 ENCOUNTER — Inpatient Hospital Stay: Payer: 59 | Attending: Gynecologic Oncology | Admitting: Gynecologic Oncology

## 2020-11-08 ENCOUNTER — Other Ambulatory Visit: Payer: Self-pay

## 2020-11-08 ENCOUNTER — Telehealth: Payer: Self-pay | Admitting: Internal Medicine

## 2020-11-08 VITALS — BP 139/75 | HR 89 | Temp 97.6°F | Resp 16 | Ht 61.0 in | Wt 127.0 lb

## 2020-11-08 DIAGNOSIS — R262 Difficulty in walking, not elsewhere classified: Secondary | ICD-10-CM | POA: Insufficient documentation

## 2020-11-08 DIAGNOSIS — C519 Malignant neoplasm of vulva, unspecified: Secondary | ICD-10-CM

## 2020-11-08 DIAGNOSIS — Z23 Encounter for immunization: Secondary | ICD-10-CM | POA: Insufficient documentation

## 2020-11-08 DIAGNOSIS — R630 Anorexia: Secondary | ICD-10-CM | POA: Diagnosis not present

## 2020-11-08 DIAGNOSIS — G893 Neoplasm related pain (acute) (chronic): Secondary | ICD-10-CM | POA: Diagnosis not present

## 2020-11-08 DIAGNOSIS — Z923 Personal history of irradiation: Secondary | ICD-10-CM | POA: Diagnosis not present

## 2020-11-08 DIAGNOSIS — C774 Secondary and unspecified malignant neoplasm of inguinal and lower limb lymph nodes: Secondary | ICD-10-CM | POA: Diagnosis not present

## 2020-11-08 DIAGNOSIS — I9789 Other postprocedural complications and disorders of the circulatory system, not elsewhere classified: Secondary | ICD-10-CM | POA: Diagnosis not present

## 2020-11-08 DIAGNOSIS — I890 Lymphedema, not elsewhere classified: Secondary | ICD-10-CM | POA: Insufficient documentation

## 2020-11-08 DIAGNOSIS — D649 Anemia, unspecified: Secondary | ICD-10-CM | POA: Insufficient documentation

## 2020-11-08 DIAGNOSIS — R634 Abnormal weight loss: Secondary | ICD-10-CM | POA: Diagnosis not present

## 2020-11-08 DIAGNOSIS — Z9221 Personal history of antineoplastic chemotherapy: Secondary | ICD-10-CM | POA: Diagnosis not present

## 2020-11-08 DIAGNOSIS — R102 Pelvic and perineal pain: Secondary | ICD-10-CM | POA: Insufficient documentation

## 2020-11-08 DIAGNOSIS — R600 Localized edema: Secondary | ICD-10-CM | POA: Insufficient documentation

## 2020-11-08 MED ORDER — OXYCODONE HCL 10 MG PO TABS: 10.0000 mg | ORAL_TABLET | ORAL | 0 refills | Status: DC | PRN

## 2020-11-08 MED ORDER — MORPHINE SULFATE ER 15 MG PO TBCR: 15.0000 mg | EXTENDED_RELEASE_TABLET | Freq: Two times a day (BID) | ORAL | 0 refills | Status: DC

## 2020-11-08 NOTE — Telephone Encounter (Signed)
Patient sent over [place] appointment request for flu shot and also asked if she is due for her second shingles shot.  Patient would like to know if she is due for a second shingles shot.  Please advise.

## 2020-11-08 NOTE — Therapy (Signed)
[place], [place], [address] Phone: [phone]   Fax:  [phone]  Physical Therapy Treatment  Patient Details  Name: Christina [name] MRN: [id number] Date of Birth: March 17, 1960 Referring Provider (PT): [name]   Encounter Date: 11/08/2020   PT End of Session - 11/08/20 1706     Visit Number 10    Number of Visits 33    Date for PT Re-Evaluation 12/15/20    PT Start Time 1500    PT Stop Time 1555    PT Time Calculation (min) 55 min    Activity Tolerance Patient tolerated treatment well    Behavior During Therapy [facility] for tasks assessed/performed             Past Medical History:  Diagnosis Date   Arthritis    Chicken pox    Chronic back pain    History of radiation therapy    Pelvis 08/03/20-10/02/20- Dr. Gery Pray   HLD (hyperlipidemia)    diet controlled   Hypertension    Recurrent upper respiratory infection (URI)    Seasonal allergies    Vulvar cancer ([place])     Past Surgical History:  Procedure Laterality Date   BLADDER SUSPENSION     CHOLECYSTECTOMY     COLONOSCOPY     INGUINAL LYMPHADENECTOMY Bilateral 07/11/2020   Procedure: INGUINAL LYMPHADENECTOMY;  Surgeon: Everitt Amber, MD;  Location: [facility];  Service: Gynecology;  Laterality: Bilateral;   TOTAL KNEE ARTHROPLASTY Right 04/25/2020   Procedure: RIGHT TOTAL KNEE ARTHROPLASTY;  Surgeon: Meredith Pel, MD;  Location: [facility];  Service: Orthopedics;  Laterality: Right;   WISDOM TOOTH EXTRACTION      There were no vitals filed for this visit.   Subjective Assessment - 11/08/20 1457     Subjective The lymphedema is on the left leg.  Is it usually the lower leg.  It is back down to no swelling when I wake up    Pertinent History hx of vulvar cancer and underwent bilateral inguinal lymphadenectomy 07/11/20, radiation ended 10/02/20 will be starting chemotherapy and palliation due to continued vulvar cancer after radiation,   04/25/20- R TKA, pt got an ablation in Jan 2022 has chronic back pain, L4/L5 are compressed Pt has had problems with incontinece in the past and has pelvic PT and surgery for that    Currently in Pain? Yes    Pain Score 3     Pain Location Vagina    Pain Descriptors / Indicators Aching    Pain Type Chronic pain    Pain Frequency Constant                   LYMPHEDEMA/ONCOLOGY QUESTIONNAIRE - 11/08/20 0001       Type   Cancer Type vulvar cancer      Treatment   Active Radiation Treatment No      Left Lower Extremity Lymphedema   20 cm Proximal to Suprapatella 52 cm    10 cm Proximal to Suprapatella 43.4 cm    At Midpatella/Popliteal Crease 32 cm    30 cm Proximal to Floor at Lateral Plantar Foot 34.8 cm    20 cm Proximal to Floor at Lateral Plantar Foot 28 cm    10 cm Proximal to Floor at Lateral Malleoli 21.5 cm    Circumference of ankle/heel 30.8 cm.    5 cm Proximal to 1st MTP Joint 22.5 cm    Across MTP Joint 21.7 cm  Around Proximal Great Toe 7.2 cm                        [facility] Adult PT Treatment/Exercise - 11/08/20 0001       Manual Therapy   Manual Lymphatic Drainage (MLD) in supine with legs elevated: short neck,left axillary node, left inguinal-axillary anastamosis. left lateral hip and thigh, medial hip to knee and return along pathways with deeper pressure lower leg foot and ankle due to pitting                          PT Long Term Goals - 09/14/20 1546       PT LONG TERM GOAL #1   Title Pt will be independent with self MLD for long term management of edema.    Time 12    Period Weeks    Status On-going      PT LONG TERM GOAL #2   Title Pt will report a 50% improvement in swelling surrounding left groin scar to allow improved comfort    Time 12    Period Weeks    Status On-going      PT LONG TERM GOAL #3   Title Pt will be independent in Strength ABC program to begin strengthening and stretching once she is  cleared by her surgeon.    Time 12    Period Weeks    Status On-going      PT LONG TERM GOAL #4   Title Pt will obtain appropriate compression garments for long term management of edema.    Status Achieved                   Plan - 11/08/20 1707     Clinical Impression Statement Pt returns to PT for treatment on the left Lower leg which was increased in circumferences around 2-3 cm 10 and 20cm proximal to the floor with pitting up to the same level.  Pt got bad news recently that the cancer remains and she will start palliative chemotherapy.  Pt is in alot of pain but comfortable seated on a pillow with the legs propped during MLD.  Will continue MLD for pain and symptom relief.    PT Frequency 2x / week    PT Duration 12 weeks    PT Treatment/Interventions ADLs/Self Care Home Management;Therapeutic exercise;Therapeutic activities;Patient/family education;Orthotic Fit/Training;Manual techniques;Manual lymph drainage;Compression bandaging;Scar mobilization;Taping;Vasopneumatic Device    PT Next Visit Plan cont  MLD to Lt LE - pt likes to sit on pillow for comfort    Consulted and Agree with Plan of Care Patient             Patient will benefit from skilled therapeutic intervention in order to improve the following deficits and impairments:     Visit Diagnosis: Localized edema  Difficulty walking     Problem List Patient Active Problem List   Diagnosis Date Noted   Vulvar pain 11/08/2020   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   Cellulitis, wound, post-operative 07/18/2020   Vulvar cancer ([place]) 07/11/2020   Vulva cancer ([place]) 07/11/2020   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   Arthritis of right knee    S/P total knee arthroplasty, 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   Degenerative arthritis of knee, bilateral 12/31/2019   Patellofemoral arthritis of left knee 03/10/2019   Acute medial meniscus tear of left knee 11/19/2018   Sprain of medial collateral ligament of left knee  09/01/2018  Neck strain, initial encounter 08/28/2018   Degenerative disc disease, cervical 08/28/2018   Left lumbar radiculopathy 03/05/2018   Right lateral epicondylitis 10/30/2017   SI (sacroiliac) joint dysfunction 10/30/2017   Nonallopathic lesion of sacral region 10/30/2017   Nonallopathic lesion of lumbar region 10/30/2017   Nonallopathic lesion of thoracic region 10/30/2017   History of recurrent UTIs 04/11/2017   Hypertension, essential, benign 10/29/2016   Allergic rhinitis 10/29/2016    Stark Bray, PT 11/08/2020, 5:12 PM  [place], [place], [address] Phone: [phone]   Fax:  [phone]  Name: Christina [name] MRN: [id number] Date of Birth: June 03, 1960

## 2020-11-08 NOTE — Telephone Encounter (Signed)
Called Christina [name] and scheduled new patient appointment with Dr. Alvy Bimler on 11/13/2020.  Advised her to arrive at 8:30 to check in and to bring a family member.  She verbalized understanding and agreement.  Oncology Nurse Navigator Documentation  Oncology Nurse Navigator Flowsheets 11/08/2020  Navigator Location [facility]  Navigator Encounter Type Telephone  Telephone Outgoing Call;Appt Confirmation/Clarification  Barriers/Navigation Needs -  Education -  Interventions Coordination of Care;Referrals  Acuity -  Referrals Medical Oncology;Palliative Care  Coordination of Care Appts  Time Spent with Patient 30

## 2020-11-08 NOTE — Progress Notes (Signed)
Radiation Oncology         [phone] ________________________________  Name: Christina [name] MRN: [id number]  Date: 11/09/2020  DOB: 06/26/1960  Follow-Up Visit Note  CC: Panosh, Standley Brooking, MD  Everitt Amber, MD    ICD-10-CM   1. 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 Vulvar cancer (HCC)  C51.9       Diagnosis: Clinical Stage: IB, Vulvar Cancer, Squamous cell  with involvement of the anal sphincter.    Interval Since Last Radiation: 1 month and 1 week   Intent: Curative  Radiation Treatment Dates: 08/03/2020 through 10/02/2020 Site Technique Total Dose (Gy) Dose per Fx (Gy) Completed Fx Beam Energies  Vulva: Pelvis IMRT 50.4/50.4 1.8 28/28 6X  Vulva: Pelvis_Bst IMRT 12.6/12.6 1.8 7/7 6X  Vulva: Pelvis_Bst_2 IMRT 10/10 2 5/5 6X    Cumulative dose to the vulvar region was 63.0 Gy.  Cumulative dose to the pelvis 50.4 Gy.  The inguinal areas were boosted to a cumulative dose of 60.4 Gy.  Narrative:  The patient returns today for routine follow-up.  She tolerated her recent radiation treatment relatively well, with the exception of pain and burning to the pelvic area near the end of her treatment, as well as an increase in nausea. Zofran worked well for combating her nausea, as well as Silvadene and Aquaphor for pelvic irritation.   Since the patients initial consultation this past May, the patient underwent biopsies of the right and left inguinal femoral lymph nodes on 07/11/20. 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  PET scan performed on 08/08/20 demonstrated intense activity midline along the perineum/external genitalia, with differentials of FDG urine contamination versus malignancy of the external genitalia. PET also revealed bilateral mildly enlarged,  intensely hypermetabolic inguinal lymph nodes; noted as concerning for metastatic inguinal adenopathy. (Also seen: large benign-appearing fluid collection in the left groin, noted presumably a post surgical seroma). No evidence of metastatic pelvic iliac lymphadenopathy,  retroperitoneal periaortic metastatic adenopathy, or evidence of visceral metastases visualized.   During a follow-up visit with Dr. Denman George on 10/13/20, Dr. Denman George expressed concern that the patient has had an incomplete clinical response at the vulva to therapy. After completing radiation, the patient began to experience regrowth of tumor at the perineum that was irritated and burning.  Additionally she noted that her left inguinal lymphocyst had become enlarged and somewhat firm. Vulvar/vaginal exam performed during this visit revealed a 6x4cm oval exophytic lesion on perineal body (crossing the midline) and extending to the left (more than right). Mass was noted as fixed to the external anal sphincter, but not involving the anal or rectal mucosa. The center appeared ulcerated and cavitating with a mounded/rolled edge (size dimensions noted similar to pre-treatment). Lymph node survey performed also revealed an approximately 12 cm firm, cystic, well circumscribed mass in the left inguinal region as consistent with lymphocyst. Mass was noted as minimally tender with with overlying erythema. Dr. Denman George advised giving an additional month for the radiation to take effect for follow-up exam of the mass as well as PET. Drainage of the lymphocyst was also recommended. If the cancer is still present in late September (and no disease is seen elsewhere) Dr. Denman George presented the option of total pelvic exenteration. An alternative of chemotherapy or targeted therapy (likely palliative) was also presented.  Cytology from drainage of lymphocyst reported on 11/02/20 revealed no malignant cells identified.   In recent history, the patient followed up with  Erik Obey FNP, ([facility]) on 10/30/20. During this visit, the patient presented with concerns of UTI symptoms with negative urine cultures (all past urine cultures obtained this year following UTI symptoms have been negative). When symptoms present, the patient reports dysuria, increased frequency, nocturia, straining to void, and sensations of incomplete emptying. She also reports daily episodes of incontinence. (The patient is unable to wear incontinence products due to pain related to vulvar tumor). UA obtained during this visit was positive for large leukocytes and moderate blood. Provider then consulted with Dr. Amalia Hailey regarding the patients UTI symptoms w/ negative urine cultures. Dr. Amalia Hailey spoke with patient and discussed radiation cystitis as the etiology for her symptoms, along with her known OAB. The patient opted to proceed with Elmiron and to restart Ditropan XL 15 mg daily.      Restaging PET performed on 11/03/20 demonstrated partial response with similar uptake at the vulva (compared with pretreatment), and a slight decrease in size and activity of the left inguinal nodes. However, a slight (20mm) increase in size and activity of the right inguinal nodes was visualized. Additionally, there were stable sized, but increasingly avid nodes in the pelvic basins. No other sites of disease were appreciated otherwise.   The patient followed up with Dr. Denman George on 11/08/20 for repeat exam of vulvar mass as planned. Disease appeared clinically persistent on examination, prompting biopsy to confirm histology. Dr. Denman George informed the patient that her treatment options of total pelvic exenteration vs chemotherapy will both likely be palliative in nature. The patient opted to proceed with perusing chemotherapy and will see Dr. Alvy Bimler in the near future to plan salvage systemic therapy. MS Contin was prescribed to the patient at this time for pain management.  In addition the patient will be set up to see  palliative care to help manage her pain medications.  Pathology from vulvar biopsy performed on 11/08/20 is pending at this time.  This was actually performed of a satellite lesion close to the primary site.                          Allergies:  has No Known Allergies.  Meds: Current Outpatient Medications  Medication Sig Dispense Refill   Barberry-Oreg Grape-Goldenseal (BERBERINE COMPLEX PO) Take 1 capsule by mouth daily.     bisacodyl (DULCOLAX) 5 MG EC tablet Take 5 mg by mouth daily as needed for moderate constipation.     cetirizine (ZYRTEC) 10 MG tablet TAKE 1 TO 2 TABLETS BY MOUTH DAILY AS NEEDED (Patient taking differently: Take 10 mg by mouth daily.) 60 tablet 5   Cholecalciferol (VITAMIN D) 50 MCG (2000 UT) tablet Take 2,000 Units by mouth daily.     doxylamine, Sleep, (UNISOM) 25 MG tablet Take 25 mg by mouth at bedtime as needed for sleep.     Emollient (AQUAPHOR EX) Apply 1 application topically daily.     ibuprofen (ADVIL) 600 MG tablet Take 1 tablet (600 mg total) by mouth every 6 (six) hours as needed for moderate pain. 30 tablet 0   LORazepam (ATIVAN) 0.5 MG tablet Take 1 tablet (0.5 mg total) by mouth every 8 (eight) hours. As needed for nausea or anxiety 30 tablet 0   losartan (COZAAR) 50 MG tablet TAKE 1 TABLET(50 MG) BY MOUTH DAILY 90 tablet 0   morphine (MS  CONTIN) 15 MG 12 hr tablet Take 1 tablet (15 mg total) by mouth every 12 (twelve) hours. 60 tablet 0   oxybutynin (DITROPAN XL) 15 MG 24 hr tablet Take 15 mg by mouth daily.     Oxycodone HCl 10 MG TABS Take 1 tablet (10 mg total) by mouth every 2 (two) hours as needed. 60 tablet 0   Pentosan Polysulfate Sodium (ELMIRON PO) Take 200 mg by mouth 2 (two) times daily.     polyethylene glycol (MIRALAX / GLYCOLAX) 17 g packet Take 17 g by mouth daily.     Probiotic Product (PROBIOTIC PO) Take 1 tablet by mouth daily.     TURMERIC CURCUMIN PO Take 300 mg by mouth daily.     vitamin E 180 MG (400 UNITS) capsule Take 400  Units by mouth daily.     cephALEXin (KEFLEX) 500 MG capsule Take 1 capsule (500 mg total) by mouth 4 (four) times daily. (Patient not taking: Reported on 11/09/2020) 28 capsule 0   ipratropium (ATROVENT) 0.06 % nasal spray USE 2 SPRAYS IN EACH NOSTRIL THREE TIMES DAILY (Patient not taking: Reported on 11/09/2020) 15 mL 3   levocetirizine (XYZAL) 5 MG tablet Take 1 tablet (5 mg total) by mouth every evening. (Patient not taking: Reported on 11/09/2020) 30 tablet 5   lidocaine (XYLOCAINE) 2 % solution Apply small amount over sore lesion up to every 4 hours as needed for pain (Patient not taking: Reported on 11/09/2020) 15 mL 3   lidocaine (XYLOCAINE) 5 % ointment Apply 1 application topically 4 (four) times daily as needed for moderate pain. (Patient not taking: Reported on 11/09/2020) 35.44 g 0   ondansetron (ZOFRAN ODT) 4 MG disintegrating tablet 4mg  ODT q4 hours prn nausea/vomit (Patient not taking: Reported on 11/09/2020) 15 tablet 1   oxyCODONE (OXY IR/ROXICODONE) 5 MG immediate release tablet Take 1 tablet (5 mg total) by mouth every 2 (two) hours as needed for severe pain. For cancer related pain, Do not take and drive (Patient not taking: Reported on 11/09/2020) 60 tablet 0   oxyCODONE ER (XTAMPZA ER) 9 MG C12A Take 9 mg by mouth 2 (two) times daily. 12 hours apart (Patient not taking: Reported on 11/09/2020) 30 capsule 0   phenazopyridine (PYRIDIUM) 200 MG tablet Take 1 tablet (200 mg total) by mouth 3 (three) times daily as needed for pain. (Patient not taking: Reported on 11/09/2020) 30 tablet 1   senna-docusate (SENOKOT-S) 8.6-50 MG tablet Take 2 tablets by mouth at bedtime. For AFTER surgery, do not take if having diarrhea (Patient not taking: Reported on 11/09/2020) 30 tablet 0   No current facility-administered medications for this encounter.    Physical Findings: The patient is in no acute distress. Patient is alert and oriented.  height is 5\' 1"  (1.549 m) and weight is 126 lb (57.2 kg). Her  temperature is 97.9 F (36.6 C). Her blood pressure is 99/72 and her pulse is 93. Her respiration is 20 and oxygen saturation is 96%. .  No significant changes. Lungs are clear to auscultation bilaterally. Heart has regular rate and rhythm. No palpable cervical, supraclavicular, or axillary adenopathy. Abdomen soft, non-tender, normal bowel sounds.  Pelvic exam not performed in light of recent exam and biopsy by Dr. Denman George yesterday.     Lab Findings: Lab Results  Component Value Date   WBC 11.6 (H) 11/02/2020   HGB 11.1 (L) 11/02/2020   HCT 35.0 (L) 11/02/2020   MCV 98.0 11/02/2020   PLT 424 (H)  11/02/2020    Radiographic Findings: NM PET Image Restage (PS) Skull Base to Thigh (F-18 FDG)  Result Date: 11/04/2020 CLINICAL DATA:  Subsequent treatment strategy for vulvar carcinoma. EXAM: NUCLEAR MEDICINE PET SKULL BASE TO THIGH TECHNIQUE: 6.28 mCi F-18 FDG was injected intravenously. Full-ring PET imaging was performed from the skull base to thigh after the radiotracer. CT data was obtained and used for attenuation correction and anatomic localization. Fasting blood glucose: 117 mg/dl COMPARISON:  PET-CT 08/08/2020 FINDINGS: Mediastinal blood pool activity: SUV max 1.76 Liver activity: SUV max NA NECK: No hypermetabolic lymph nodes in the neck. Incidental CT findings: none CHEST: No hypermetabolic mediastinal or hilar nodes. No suspicious pulmonary nodules on the CT scan. No breast masses, supraclavicular or axillary adenopathy. Incidental CT findings: Stable atherosclerotic calcifications involving the aorta and coronary arteries. ABDOMEN/PELVIS: No hepatic or adrenal gland lesions. No abdominal lymphadenopathy. The vulvar lesions is slightly less apparent on the CT scan but difficult to measure. SUV max is 17.76 and was previously 18.95. The large left inguinal fluid collection is markedly smaller. 15 mm left inguinal lymph node on image 160/4 has an SUV max of 10.85. Previously measured 16.5 mm  and had an SUV max of 11.35 The largest right inguinal node measures 13 mm on image 168/4. This previously measured 12 mm. SUV max is 12.65 and was previously 8.67. Two other smaller right inguinal nodes are hypermetabolic. Pelvic sidewall lymph nodes are stable in size. Right-sided node on image 155/4 has an SUV max of 5.71. This was previously 3.36. Foci of hypermetabolism near iliac arteries without definite CT correlate. This is likely due to the ureters. Incidental CT findings: Stable vascular calcifications. Status post cholecystectomy. SKELETON: No significant bony findings. Incidental CT findings: none IMPRESSION: 1. No significant PET-CT findings for regression of tumor or adenopathy. 2. No new chest, abdominal or osseous metastatic disease. Electronically Signed   By: Marijo Sanes M.D.   On: 11/04/2020 18:14    Impression: Clinical Stage: IB, Vulvar Cancer, Squamous cell  with involvement of the anal sphincter.    The patient is recovering from the effects of radiation.  Patient's bladder symptoms seem to be improving.  She was seen by Dr. Amalia Hailey at [facility] who prescribed Ditropan XL and elmiron which is often used for interstitial cystitis pain.  Patient initially had a response to her radiation therapy at the primary site but within 2 weeks of completing therapy had grown back to pretreatment levels suggesting a very aggressive tumor.   PET positive lymph nodes in the inguinal areas had not responded  to radiation therapy at a dose of 60 Gray.  Today the patient was given a vaginal dilator and instructions on its use.  She likely cannot use this at this time but if she has tumor shrinkage with upcoming chemotherapy or immunotherapy this may be a factor later on.  Plan: As needed follow-up in radiation oncology.  She will meet with Dr. Alvy Bimler early next week for discussion of chemotherapy.  At a later date the patient may be candidate for immunotherapy.  Palliative care consult pending.     _______________________  [name], PhD, MD   This document serves as a record of services personally performed by Gery Pray, MD. It was created on his behalf by Roney Mans, a trained medical scribe. The creation of this record is based on the scribe's personal observations and the provider's statements to them. This document has been checked and approved by the attending provider.

## 2020-11-08 NOTE — Progress Notes (Incomplete)
  Radiation Oncology         [phone] ________________________________  Patient Name: Christina [name] MRN: [id number] DOB: 1960-10-24 Referring Physician: Shanon Ace (Profile Not Attached) Date of Service: 10/02/2020 [facility], [place]  End Of Treatment Note  Diagnoses: C51.9-Malignant neoplasm of vulva, unspecified  Cancer Staging: Clinical Stage: IB, Vulvar Cancer, Squamous cell  with involvement of the anal sphincter.    Intent: Curative  Radiation Treatment Dates: 08/03/2020 through 10/02/2020 Site Technique Total Dose (Gy) Dose per Fx (Gy) Completed Fx Beam Energies  Vulva: Pelvis IMRT 50.4/50.4 1.8 28/28 6X  Vulva: Pelvis_Bst IMRT 12.6/12.6 1.8 7/7 6X  Vulva: Pelvis_Bst_2 IMRT 10/10 2 5/5 6X   Narrative: The patient tolerated radiation therapy relatively well though reports pain and burning to pelvic area this week. She has been taking oxycodone with some relief. Patient continues to have occasional diarrhea. Dysuria has improved. Nausea has increased but reports no vomiting. Patient reports occasional itching to pelvic area. Appetite remains poor but patient is able to eat slightly more than previously. Sitz bath helps a lot, as well as silvadene and Aquaphor.  (Zofran continues to help her a lot; placed refill on 09/26/20).   Plan: The patient will follow-up with radiation oncology in one month .  ________________________________________________ -----------------------------------  [name], PhD, MD  This document serves as a record of services personally performed by Gery Pray, MD. It was created on his behalf by Roney Mans, a trained medical scribe. The creation of this record is based on the scribe's personal observations and the provider's statements to them. This document has been checked and approved by the attending provider.

## 2020-11-08 NOTE — Progress Notes (Signed)
Follow-up Note: Gyn-Onc  Consult was requested by Dr. Dellis Filbert for the evaluation of Christina [name] 60 y.o. female  CC:  Chief Complaint  Patient presents with   Vulvar Cancer    Assessment/Plan:  [name]  is a 60 y.o.  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  S/p bilateral inguinal dissection/debulking and s/p definitive radiation to the vulva and pelvis and inguinal nodes (completed 10/02/20).   1/ Clinically persistent disease on exam today and post-treatment PEt. Will confirm this with today's biopsy. I again counseled Christina [name], her husband and daughter, about the poor prognosis associated with persistent, unresectable vulvar cancer. I explained that given the concerning appearance of pelvic and Rt inguinal lymph nodes, total pelvic exenteration would unlikely be curative. However, it could be contemplated for palliative purposes (pain relief). She is not interested in this surgical procedure given its radicality. I explained that goals of systemic therapy are palliative, however, partial response and stable disease can be achieved with this cancer, and enduring responses of many years can be experienced. Given that she is PDL1 positive (15% CPS), she is a good candidate for pembrolizumab, however, this would be unlikely to be used until after failed response to or tolerance of taxane/cisplatin chemotherapy.   She has agreed to see  Dr Heath Lark to discuss and plan salvage systemic therapy.  She will need to see my partner, Dr Berline Lopes after cycle 3, for evaluation of the primary site.    2/ poorly controlled vulvar pain I counseled Christina [name] regarding optimization of her pain medication regimen including better use of long-acting pain meds. I will prescribe MS Contin 55m BID as this may be more effective than oxycontin. She was recommended to continue the MS contin for at  least 3 days before letting [place] if she feels that an increased dose is necessary. We will refer her to palliative care to assist in pain management.   3/ left LE lymphedema and inguinal lymphocyst.  She is seeing PT for this.  Drainage of lymphocyst showed benign cytology in September, 2022.   He engaged in counseling regarding prognosis, end-of-life planning, and symptom management.   HPI: [name] is a 60year old P4 who was seen in consultation at the request of Dr LDellis Filbertfor evaluation of a vulvar cancer.  The patient began experiencing vulvar burning after her knee replacement in March 2022.  She mentioned this to her primary care physician who looked at it and was unclear about the diagnosis but recommended consultation with a gynecologist.  The patient did not have an established gynecologist and therefore made a new patient consultation with Dr. LDellis Filbert  Dr. LDellis Filbertsaw the patient on 06/16/2020 which revealed a squamous cell carcinoma of the vulva.  The tumor was well differentiated and keratinizing.  Her primary vulvar cancer was not felt to be amenable to primary resection as it was a 6cm lesion replacing the perineal body and adherent with the anal sphincter.  A staging CT scan on 06/30/20 showed no gross metastatic disease (no lymphadenopathy).  She was taken to the operating room on 07/11/20 for bilateral inguinofemoral lymphadenectomy to surgically evaluate the groins prior to proceeding with radiation to the vulva.  Final pathology from bilateral inguinofemoral lymphadenectomy revealed positive lymph nodes for metastatic squamous cell carcinoma in 1 of 3 right inguinal lymph nodes and 2/4 left inguinal  lymph nodes.  The size of the metastases were 5.5 mm and 11 mm.   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 Recommended therapy was primary radiation to the vulva with radiation to the groins and pelvis.  Plan would be for repeat biopsy of the vulva  6-12 weeks after completing radiation to confirm complete pathologic response. If there was a complete pathologic response, no further therapy would be necessary, however if there was gross or macroscopic residual disease, she would require a resection.   She developed a left inguinal lymphocyst postoperatively.  She received a pre-treatment PET scan on 08/08/2020 prior to commencing radiation therapy.  This revealed negative chest, intense activity in the location of her vulvar tumor.  There was evidence of enlarged hypermetabolic left inguinal lymph nodes adjacent to a biopsy clip.  It measured 17 mm in greatest dimension.  On the right side there was a 10 mm mildly PET avid inguinal lymph node adjacent to a biopsy clip.  There were no upper station lymphadenopathy PET avidity in the pelvic, iliac, or aortic chains.  There was no signs of visceral metastases.  She went on to receive definitive radiation to the vulva (perineum), inguinal lymph node region and pelvic basin.  Treatment dates were between 08/03/2020 through 10/02/2020.  Please review radiation oncology notes for specifics regarding location of and dosing.  Interval Hx:  She tolerated therapy fairly well and initially noted response that was dramatic at the vulva with significant improvement of symptoms of vulvar irritation.  However after completing radiation she began to experience regrowth of tumor at the perineum that was irritated and burning.  Additionally she noted that her left inguinal lymphocyst had become enlarged and somewhat firm.    On exam at 6 weeks post treatment there was persistent disease with a cavitating 6+cm oval exophytic lesion replacing the perineum and involving the anal sphincter.  Repeat PET at 3 months post treatment (11/03/20) showed partial response with similar uptake at the vulva (compared with pretreatment), and slightly decreased size and activity of the left inguinal nodes, but slightly (59m) increased size  and activity of the right inguinal nodes. Additionally there were stable size but increasingly avid nodes in the pelvic basins. No other sites of disease were seen.  The large left inguinal lymphocyst was drained in September, 2022 with negative cytology.  Her pain at the vulva was extreme. She attempted oxycontin with oxycodone for breakthrough but felt that the oxycodone didn't help and discontinued. Pain is her major symptom. As is edema.    Current Meds:  Outpatient Encounter Medications as of 11/08/2020  Medication Sig   Barberry-Oreg Grape-Goldenseal (BERBERINE COMPLEX PO) Take 1 capsule by mouth daily.   cetirizine (ZYRTEC) 10 MG tablet TAKE 1 TO 2 TABLETS BY MOUTH DAILY AS NEEDED (Patient taking differently: Take 10 mg by mouth daily.)   Cholecalciferol (VITAMIN D) 50 MCG (2000 UT) tablet Take 2,000 Units by mouth daily.   doxylamine, Sleep, (UNISOM) 25 MG tablet Take 25 mg by mouth at bedtime as needed for sleep.   Emollient (AQUAPHOR EX) Apply 1 application topically daily.   ibuprofen (ADVIL) 600 MG tablet Take 1 tablet (600 mg total) by mouth every 6 (six) hours as needed for moderate pain.   LORazepam (ATIVAN) 0.5 MG tablet Take 1 tablet (0.5 mg total) by mouth every 8 (eight) hours. As needed for nausea or anxiety   losartan (COZAAR) 50 MG tablet TAKE 1 TABLET(50 MG) BY MOUTH DAILY  morphine (MS CONTIN) 15 MG 12 hr tablet Take 1 tablet (15 mg total) by mouth every 12 (twelve) hours.   oxybutynin (DITROPAN XL) 15 MG 24 hr tablet Take 15 mg by mouth daily.   oxyCODONE (OXY IR/ROXICODONE) 5 MG immediate release tablet Take 1 tablet (5 mg total) by mouth every 2 (two) hours as needed for severe pain. For cancer related pain, Do not take and drive   Oxycodone HCl 10 MG TABS Take 1 tablet (10 mg total) by mouth every 2 (two) hours as needed.   Pentosan Polysulfate Sodium (ELMIRON PO) Take 200 mg by mouth 2 (two) times daily.   Probiotic Product (PROBIOTIC PO) Take 1 tablet by mouth  daily.   TURMERIC CURCUMIN PO Take 300 mg by mouth daily.   vitamin E 180 MG (400 UNITS) capsule Take 400 Units by mouth daily.   cephALEXin (KEFLEX) 500 MG capsule Take 1 capsule (500 mg total) by mouth 4 (four) times daily.   ipratropium (ATROVENT) 0.06 % nasal spray USE 2 SPRAYS IN EACH NOSTRIL THREE TIMES DAILY   levocetirizine (XYZAL) 5 MG tablet Take 1 tablet (5 mg total) by mouth every evening.   lidocaine (XYLOCAINE) 2 % solution Apply small amount over sore lesion up to every 4 hours as needed for pain   lidocaine (XYLOCAINE) 5 % ointment Apply 1 application topically 4 (four) times daily as needed for moderate pain.   ondansetron (ZOFRAN ODT) 4 MG disintegrating tablet 35m ODT q4 hours prn nausea/vomit   oxyCODONE ER (XTAMPZA ER) 9 MG C12A Take 9 mg by mouth 2 (two) times daily. 12 hours apart   phenazopyridine (PYRIDIUM) 200 MG tablet Take 1 tablet (200 mg total) by mouth 3 (three) times daily as needed for pain.   senna-docusate (SENOKOT-S) 8.6-50 MG tablet Take 2 tablets by mouth at bedtime. For AFTER surgery, do not take if having diarrhea   No facility-administered encounter medications on file as of 11/08/2020.    Allergy: No Known Allergies  Social Hx:   Social History   Socioeconomic History   Marital status: Married    Spouse name: Not on file   Number of children: Not on file   Years of education: Not on file   Highest education level: Not on file  Occupational History   Occupation: retired  Tobacco Use   Smoking status: Former    Packs/day: 0.50    Years: 2.00    Pack years: 1.00    Types: Cigarettes   Smokeless tobacco: Never   Tobacco comments:    Smoke in 20's  Vaping Use   Vaping Use: Never used  Substance and Sexual Activity   Alcohol use: Not Currently   Drug use: No   Sexual activity: Yes    Partners: Male    Birth control/protection: Post-menopausal  Other Topics Concern   Not on file  Social History Narrative   Not on file   Social  Determinants of Health   Financial Resource Strain: Not on file  Food Insecurity: Not on file  Transportation Needs: Not on file  Physical Activity: Not on file  Stress: Not on file  Social Connections: Not on file  Intimate Partner Violence: Not on file    Past Surgical Hx:  Past Surgical History:  Procedure Laterality Date   BLADDER SUSPENSION     CHOLECYSTECTOMY     COLONOSCOPY     INGUINAL LYMPHADENECTOMY Bilateral 07/11/2020   Procedure: INGUINAL LYMPHADENECTOMY;  Surgeon: REveritt Amber MD;  Location:  [facility];  Service: Gynecology;  Laterality: Bilateral;   TOTAL KNEE ARTHROPLASTY Right 04/25/2020   Procedure: RIGHT TOTAL KNEE ARTHROPLASTY;  Surgeon: Meredith Pel, MD;  Location: [facility];  Service: Orthopedics;  Laterality: Right;   WISDOM TOOTH EXTRACTION      Past Medical Hx:  Past Medical History:  Diagnosis Date   Arthritis    Chicken pox    Chronic back pain    History of radiation therapy    Pelvis 08/03/20-10/02/20- Dr. Gery Pray   HLD (hyperlipidemia)    diet controlled   Hypertension    Recurrent upper respiratory infection (URI)    Seasonal allergies    Vulvar cancer [facility])     Past Gynecological History:  SVD x 4. No hx of abnormal paps. No LMP recorded. Patient is postmenopausal.  Family Hx:  Family History  Problem Relation Age of Onset   Hyperlipidemia Mother    Hypertension Mother    Stroke Mother    Breast cancer Mother    Prostate cancer Father    Hypertension Maternal Grandmother    Hypertension Paternal Grandmother    Stroke Paternal Grandmother    Allergic rhinitis Neg Hx    Asthma Neg Hx    Ovarian cancer Neg Hx    Uterine cancer Neg Hx    Colon cancer Neg Hx    Pancreatic cancer Neg Hx     Review of Systems:  Constitutional  Reports weight loss  ENT Normal appearing ears and nares bilaterally Skin/Breast  No rash, sores, jaundice, itching, dryness Cardiovascular  No chest pain, shortness of breath, or edema  Pulmonary   No cough or wheeze.  Gastro Intestinal  No nausea, vomitting, or diarrhoea. No bright red blood per rectum, no abdominal pain, change in bowel movement, or constipation.  Genito Urinary  No frequency, urgency, dysuria, + vulvar pain and edema Musculo Skeletal  No myalgia, arthralgia, joint swelling or pain  Neurologic  No weakness, numbness, change in gait,  Psychology  No depression, anxiety, insomnia.   Vitals:  Blood pressure 139/75, pulse 89, temperature 97.6 F (36.4 C), temperature source Oral, resp. rate 16, height _0  (1.549 m), weight 127 lb (57.6 kg), SpO2 97 %.  Physical Exam: WD in NAD Neck  Supple NROM, without any enlargements.  Lymph Node Survey 12cm (approx) firm, cystic, well circumscribed mass in the left inguinal region consistent with lymphocyst. No overlying erythema. Minimally tender.  Cardiovascular  Well perfused peripheries.  Lungs  No increased WOB Skin  No rash/lesions/breakdown  Psychiatry  Alert and oriented to person, place, and time  Abdomen  Normoactive bowel sounds, abdomen soft, non-tender and nonobese without evidence of hernia. Back No CVA tenderness Genito Urinary  Vulva/vagina: 6x4cm oval exophytic lesion on perineal body and extending into the distal vagina (crossing the midline) and extending to the left (more than right). Mass is fixed to the external anal sphincter but does not involve the anal or rectal mucosa. The center is ulcerated and cavitating with a mounded/rolled edge. However the size dimensions are similar to pre-treatment. There is a new satellite 66m round lesion immediately lateral to the left mid aspect of the vulvar lesion consistent with dermal metastasis.  Rectal  No mucosal involvement. The tumor involves the external component of the sphincter.  Extremities  Mild lower extremity lymphedema L > R  Procedure Note:  Preop Dx: vulvar cancer Postop Dx: same Procedure: vulvar biopsy Surgeon: EDorann Ou MD EBL:  scant Specimens: vulva Complications: none Procedure  Details: the patient provided verbal consent and verbal time out was performed. The vulva was swabbed with betadine and infiltrated with 1cc of 2% lidocaine. A tischler forcep was used to take a representative biopsy of the left mid edge of tissue (nonnecrotic appearing). It was made hemostatic with silver nitrate and the specimen was sent for pathology.    Thereasa Solo, MD  11/08/2020, 3:11 PM

## 2020-11-08 NOTE — Patient Instructions (Signed)
It looks like the vulvar cancer remains after radiation. Today's biopsy will confirm this.  Dr Denman George is recommending chemotherapy and a meeting with Dr Alvy Bimler to discuss this.  She is recommending a meeting with palliative care for pain management.  She has prescribed MS Contin as long acting pain medication to take every 12 hours which should decrease the amount of short acting (oxycodone) pain tablets you need to take. It is important to take the MS Contin for at least 3 days, twice a day, before stopping. Dr Denman George will increase the dose of this if this does not help with the frequency with which you need the other medications.

## 2020-11-09 ENCOUNTER — Ambulatory Visit
Admission: RE | Admit: 2020-11-09 | Discharge: 2020-11-09 | Disposition: A | Discharge status: Home / Self Care | Payer: 59 | Source: Ambulatory Visit | Attending: Radiation Oncology | Admitting: Radiation Oncology

## 2020-11-09 ENCOUNTER — Encounter: Payer: Self-pay | Admitting: Radiation Oncology

## 2020-11-09 VITALS — BP 99/72 | HR 93 | Temp 97.9°F | Resp 20 | Ht 61.0 in | Wt 126.0 lb

## 2020-11-09 DIAGNOSIS — C519 Malignant neoplasm of vulva, unspecified: Secondary | ICD-10-CM | POA: Insufficient documentation

## 2020-11-09 HISTORY — DX: Personal history of irradiation: Z92.3

## 2020-11-09 NOTE — Progress Notes (Signed)
Christina [name] is here today for follow up post radiation to the vulva/pelvis.  They completed their radiation on: 10/02/2020   Does the patient complain of any of the following:  Pain:4/10 to vulva/perineum Abdominal bloating: denies Diarrhea/Constipation: constipation Nausea/Vomiting: denies Vaginal Discharge: denies Blood in Urine or Stool: denies Urinary Issues (dysuria/incomplete emptying/ incontinence/ increased frequency/urgency): mild dysuria, nocturia x1-2, incomplete bladder emptying, incontinence, frequency, urgency Post radiation skin changes: denies   Additional comments if applicable: none  Vitals:   11/09/20 0959  BP: 99/72  Pulse: 93  Resp: 20  Temp: 97.9 F (36.6 C)  SpO2: 96%  Weight: 126 lb (57.2 kg)  Height: 5\' 1"  (1.549 m)

## 2020-11-09 NOTE — Telephone Encounter (Signed)
Yes  can do shingrix 2# in office ( if not on medicare)

## 2020-11-09 NOTE — Telephone Encounter (Signed)
ATC unable to leave a message.

## 2020-11-10 ENCOUNTER — Other Ambulatory Visit: Payer: Self-pay | Admitting: Gynecologic Oncology

## 2020-11-10 DIAGNOSIS — F418 Other specified anxiety disorders: Secondary | ICD-10-CM

## 2020-11-10 LAB — SURGICAL PATHOLOGY

## 2020-11-10 MED ORDER — LORAZEPAM 0.5 MG PO TABS: 0.5000 mg | ORAL_TABLET | Freq: Three times a day (TID) | ORAL | 0 refills | Status: DC | PRN

## 2020-11-10 NOTE — Progress Notes (Signed)
See mychart message. Patient requesting refill on Ativan tablets. Refill sent in for patient to use over the weekend if needed to get her to her appt with Dr. Alvy Bimler on Monday. Advised of the risks with taking this medication with pain medications.

## 2020-11-13 ENCOUNTER — Encounter: Payer: Self-pay | Admitting: Hematology and Oncology

## 2020-11-13 ENCOUNTER — Telehealth: Payer: Self-pay | Admitting: Oncology

## 2020-11-13 ENCOUNTER — Other Ambulatory Visit: Payer: Self-pay

## 2020-11-13 ENCOUNTER — Other Ambulatory Visit (HOSPITAL_COMMUNITY): Payer: Self-pay

## 2020-11-13 ENCOUNTER — Inpatient Hospital Stay (HOSPITAL_BASED_OUTPATIENT_CLINIC_OR_DEPARTMENT_OTHER): Payer: 59 | Admitting: Hematology and Oncology

## 2020-11-13 ENCOUNTER — Inpatient Hospital Stay: Payer: 59

## 2020-11-13 VITALS — BP 118/75 | HR 87 | Temp 98.8°F | Resp 18 | Ht 61.0 in | Wt 126.2 lb

## 2020-11-13 DIAGNOSIS — Z23 Encounter for immunization: Secondary | ICD-10-CM

## 2020-11-13 DIAGNOSIS — G893 Neoplasm related pain (acute) (chronic): Secondary | ICD-10-CM

## 2020-11-13 DIAGNOSIS — C519 Malignant neoplasm of vulva, unspecified: Secondary | ICD-10-CM

## 2020-11-13 DIAGNOSIS — D638 Anemia in other chronic diseases classified elsewhere: Secondary | ICD-10-CM | POA: Diagnosis not present

## 2020-11-13 DIAGNOSIS — Z299 Encounter for prophylactic measures, unspecified: Secondary | ICD-10-CM | POA: Diagnosis not present

## 2020-11-13 MED ORDER — PROCHLORPERAZINE MALEATE 10 MG PO TABS
10.0000 mg | ORAL_TABLET | Freq: Four times a day (QID) | ORAL | 1 refills | Status: AC | PRN
  Filled 2020-11-13: qty 30, 8d supply, fill #0

## 2020-11-13 MED ORDER — INFLUENZA VAC SPLIT QUAD 0.5 ML IM SUSY
0.5000 mL | PREFILLED_SYRINGE | Freq: Once | INTRAMUSCULAR | Status: AC
  Administered 2020-11-13: 0.5 mL via INTRAMUSCULAR
  Filled 2020-11-13: qty 0.5

## 2020-11-13 MED ORDER — ONDANSETRON HCL 8 MG PO TABS
8.0000 mg | ORAL_TABLET | Freq: Three times a day (TID) | ORAL | 1 refills | Status: AC | PRN
  Filled 2020-11-13: qty 30, 10d supply, fill #0

## 2020-11-13 MED ORDER — LIDOCAINE-PRILOCAINE 2.5-2.5 % EX CREA
TOPICAL_CREAM | CUTANEOUS | 3 refills | Status: AC
  Filled 2020-11-13: qty 30, 30d supply, fill #0

## 2020-11-13 NOTE — Assessment & Plan Note (Signed)
This is due to recent side effects of treatment She denies recent bleeding Observe

## 2020-11-13 NOTE — Progress Notes (Signed)
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[name] We discussed the the approach with systemic treatment I reviewed the current NCCN guidelines with the patient and family The patient desires aggressive approach We discussed the risk, benefits, side effects of combination chemotherapy with cisplatin, paclitaxel and bevacizumab and she is in agreement to proceed I recommend port placement and chemo education class I will hold bevacizumab for cycle 1 and will add for future treatment to allow wound healing after port placement I plan to see her weekly in the first month of treatment for aggressive supportive care  Cancer associated pain She has severe, uncontrolled pain We discussed stepwise pain management I recommend the patient to refrain from taking too much ibuprofen I recommend oxycodone every 3 hours as needed I recommend the patient to document pain control with each dose I plan to escalate the dose of morphine sulfate in the future depending on how much oxycodone she is taking We discussed potential side effects and medication refill policy  Anemia, chronic disease This is due to recent side effects of treatment She denies recent bleeding Observe  Preventive measure We discussed the importance of preventive care and reviewed the vaccination programs. She does not have any prior allergic reactions to influenza vaccination. She agrees to proceed with influenza vaccination today and we will administer it today at the clinic.  Orders Placed This Encounter  Procedures   IR IMAGING GUIDED PORT  INSERTION    Standing Status:   Future    Standing Expiration Date:   11/13/2021    Order Specific Question:   Reason for Exam (SYMPTOM  OR DIAGNOSIS REQUIRED)    Answer:   need port for chemo to start in 1 week    Order Specific Question:   Preferred Imaging Location?    Answer:   [facility]   CBC with Differential (Cancer Center Only)    Standing Status:   Standing    Number of Occurrences:   20    Standing Expiration Date:   11/13/2021   CMP ([place] only)    Standing Status:   Standing    Number of Occurrences:   20    Standing Expiration Date:   11/13/2021   Magnesium    Standing Status:   Standing    Number of Occurrences:   20    Standing Expiration Date:   11/13/2021   Total Protein, Urine dipstick    Standing Status:   Standing    Number of Occurrences:   9    Standing Expiration Date:   11/13/2021    The total time spent in the appointment was 60 minutes encounter with patients including review of chart and various tests results, discussions about plan of care and coordination of care plan   All questions were answered. The patient knows to call the clinic with any problems, questions or concerns. No barriers to learning was detected.  Heath Lark, MD 9/26/20225:44 PM  CHIEF COMPLAINTS/PURPOSE OF CONSULTATION:  Recurrent vulvar cancer, for further evaluation and management  HISTORY OF PRESENTING ILLNESS:  Christina [name] 60 y.o. female is here because of recent diagnosis of persistent vulvar cancer  She is here accompanied by her daughter and husband Throughout the visit, the patient has significant pain The patient is very tearful She had a difficult weekend due to uncontrolled pain She rated her pain as 10 out of 10 pain She denies nausea She has poor appetite and has a lot of weight loss She has significant pressure and sensation in her perineum  I have reviewed her chart and materials related to her cancer extensively and collaborated history with the  patient. Summary of oncologic history is as follows: Oncology History Overview Note  PD-L1 CPS score is 15%   Squamous cell carcinoma of vulva ([place])  06/15/2020 Pathology Results   A. VULVA, BIOPSY:  -  Squamous cell carcinoma   06/16/2020 Initial Diagnosis   The patient began experiencing vulvar burning after her knee replacement in March 2022.  She mentioned this to her primary care physician who looked at it and was unclear about the diagnosis but recommended consultation with a gynecologist.  The patient did not have an established gynecologist and therefore made a new patient consultation with Dr. Dellis Filbert.  Dr. Dellis Filbert saw the patient on 06/16/2020 which revealed a squamous cell carcinoma of the vulva.  The tumor was well differentiated and keratinizing.  Her primary vulvar cancer was not felt to be amenable to primary resection as it was a 6cm lesion replacing the perineal body and adherent with the anal sphincter.     06/30/2020 Imaging   Ct chest, abdomen and pelvis  1. No evidence of distant metastatic disease in the chest, abdomen or pelvis. 2. The patient's known cervical cancer is not well visualized. No gross bladder or anorectal involvement. No pelvic adenopathy. 3. Tiny right middle lobe nodule, likely benign. Attention on follow-up. 4.  Aortic Atherosclerosis (ICD10-I70.0).   07/03/2020 Initial Diagnosis   Squamous cell carcinoma of vulva ([place])   07/11/2020 Surgery   Surgery: bilateral inguinofemoral lymphadenectomy.   Surgeons:  Donaciano Eva, MD   Pathology: right and left inguinofemoral lymph nodes.   Operative findings: clinically suspicious medial left inguinal lymph node. 6cm perineal vulvar tumor.     07/11/2020 Pathology Results   A. LYMPH NODE, RIGHT INGUINAL FEMORAL, RESECTION:  - Metastatic keratinizing squamous cell carcinoma involving one of three lymph nodes (1/3)  - No evidence of extranodal extension   B. LYMPH NODE, LEFT INGUINAL FEMORAL,  RESECTION:  - Metastatic keratinizing squamous cell carcinoma involving two of four lymph nodes (2/4)  - No evidence of extranodal extension    08/03/2020 - 10/02/2020 Radiation Therapy   Radiation Treatment Dates: 08/03/2020 through 10/02/2020 Site Technique Total Dose (Gy) Dose per Fx (Gy) Completed Fx Beam Energies  Vulva: Pelvis IMRT 50.4/50.4 1.8 28/28 6X  Vulva: Pelvis_Bst IMRT 12.6/12.6 1.8 7/7 6X  Vulva: Pelvis_Bst_2 IMRT 10/10 2 5/5 6X      Cumulative dose to the vulvar region was 63.0 Gy.  Cumulative dose to the pelvis 50.4 Gy.  The inguinal areas were boosted to a cumulative dose of 60.4 Gy.     08/08/2020 PET scan   1. Intense activity midline along the perineum/external genitalia with differential of FDG urine contamination versus malignancy of the external genitalia. 2. Bilateral mildly enlarged intensely hypermetabolic inguinal lymph nodes. Biopsy clips adjacent to these lymph nodes. Findings concerning for metastatic inguinal adenopathy(large benign-appearing fluid collection in the LEFT groin is presumably a post surgical seroma). 3. No evidence of metastatic pelvic iliac lymphadenopathy or retroperitoneal periaortic metastatic adenopathy. 4. No evidence of visceral metastasis. 5.  No skeletal metastasis.     08/15/2020 Imaging   Vascular US RIGHT:  - No evidence of common femoral vein obstruction.     LEFT:  - There is no evidence of deep vein thrombosis in the lower extremity.     - No cystic structure found in the popliteal fossa.  - Ultrasound characteristics of several enlarged lymph nodes, and a large anechoic area, are noted in the groin.    11/03/2020 PET scan   1. No significant PET-CT findings for regression of tumor or adenopathy. 2. No new chest, abdominal or osseous metastatic disease.     11/08/2020 Pathology Results   A. VULVA, LEFT POSTERIOR, BIOPSY:  - Invasive keratinizing squamous cell carcinoma.   11/13/2020 Cancer Staging   Staging form: Vulva,  AJCC 8th Edition - Clinical stage from 11/13/2020: Stage IVA (cT2, cN3, cM0) - Signed by Heath Lark, MD on 11/13/2020 Stage prefix: Initial diagnosis   11/22/2020 -  Chemotherapy   Patient is on Treatment Plan : Vulvar cancer Cisplatin + Paclitaxel q21d plus bevacizumab       MEDICAL HISTORY:  Past Medical History:  Diagnosis Date   Arthritis    Chicken pox    Chronic back pain    History of radiation therapy    Pelvis 08/03/20-10/02/20- Dr. Gery Pray   HLD (hyperlipidemia)    diet controlled   Hypertension    Recurrent upper respiratory infection (URI)    Seasonal allergies    Vulvar cancer ([place])     SURGICAL HISTORY: Past Surgical History:  Procedure Laterality Date   BLADDER SUSPENSION     CHOLECYSTECTOMY     COLONOSCOPY     INGUINAL LYMPHADENECTOMY Bilateral 07/11/2020   Procedure: INGUINAL LYMPHADENECTOMY;  Surgeon: Everitt Amber, MD;  Location: [facility];  Service: Gynecology;  Laterality: Bilateral;   TOTAL KNEE ARTHROPLASTY Right 04/25/2020   Procedure: RIGHT TOTAL KNEE ARTHROPLASTY;  Surgeon: Meredith Pel, MD;  Location: [place] [facility];  Service: Orthopedics;  Laterality: Right;   WISDOM TOOTH EXTRACTION      SOCIAL HISTORY: Social History   Socioeconomic History   Marital status: Married    Spouse name: Not on file   Number of children: Not on file   Years of education: Not on file   Highest education level: Not on file  Occupational History   Occupation: retired  Tobacco Use   Smoking status: Former    Packs/day: 0.50    Years: 2.00    Pack years: 1.00    Types: Cigarettes   Smokeless tobacco: Never   Tobacco comments:    Smoke in 20's  Vaping Use   Vaping Use: Never used  Substance and Sexual Activity   Alcohol use: Not Currently   Drug use: No   Sexual activity: Yes    Partners: Male    Birth control/protection: Post-menopausal  Other Topics Concern   Not on file  Social History Narrative   Not on file   Social Determinants of Health    Financial Resource Strain: Not on file  Food Insecurity: Not on file  Transportation Needs: Not on file  Physical Activity: Not on file  Stress: Not on file  Social Connections: Not on file  Intimate Partner Violence: Not on file    FAMILY HISTORY: Family History  Problem Relation Age of Onset   Hyperlipidemia Mother    Hypertension Mother    Stroke Mother    Breast cancer Mother    Prostate cancer Father    Hypertension  Maternal Grandmother    Hypertension Paternal Grandmother    Stroke Paternal Grandmother    Allergic rhinitis Neg Hx    Asthma Neg Hx    Ovarian cancer Neg Hx    Uterine cancer Neg Hx    Colon cancer Neg Hx    Pancreatic cancer Neg Hx     ALLERGIES:  has No Known Allergies.  MEDICATIONS:  Current Outpatient Medications  Medication Sig Dispense Refill   Barberry-Oreg Grape-Goldenseal (BERBERINE COMPLEX PO) Take 1 capsule by mouth daily.     bisacodyl (DULCOLAX) 5 MG EC tablet Take 5 mg by mouth daily as needed for moderate constipation.     cetirizine (ZYRTEC) 10 MG tablet TAKE 1 TO 2 TABLETS BY MOUTH DAILY AS NEEDED (Patient taking differently: Take 10 mg by mouth daily.) 60 tablet 5   Cholecalciferol (VITAMIN D) 50 MCG (2000 UT) tablet Take 2,000 Units by mouth daily.     doxylamine, Sleep, (UNISOM) 25 MG tablet Take 25 mg by mouth at bedtime as needed for sleep.     Emollient (AQUAPHOR EX) Apply 1 application topically daily.     ibuprofen (ADVIL) 600 MG tablet Take 1 tablet (600 mg total) by mouth every 6 (six) hours as needed for moderate pain. 30 tablet 0   ipratropium (ATROVENT) 0.06 % nasal spray USE 2 SPRAYS IN EACH NOSTRIL THREE TIMES DAILY (Patient not taking: Reported on 11/09/2020) 15 mL 3   levocetirizine (XYZAL) 5 MG tablet Take 1 tablet (5 mg total) by mouth every evening. (Patient not taking: Reported on 11/09/2020) 30 tablet 5   lidocaine (XYLOCAINE) 2 % solution Apply small amount over sore lesion up to every 4 hours as needed for pain  (Patient not taking: Reported on 11/09/2020) 15 mL 3   lidocaine (XYLOCAINE) 5 % ointment Apply 1 application topically 4 (four) times daily as needed for moderate pain. (Patient not taking: Reported on 11/09/2020) 35.44 g 0   lidocaine-prilocaine (EMLA) cream Apply to affected area once 30 g 3   LORazepam (ATIVAN) 0.5 MG tablet Take 1 tablet (0.5 mg total) by mouth every 8 (eight) hours as needed for anxiety. Do not take with immediate release pain meds, do not take and drive. As needed for anxiety 10 tablet 0   morphine (MS CONTIN) 15 MG 12 hr tablet Take 1 tablet (15 mg total) by mouth every 12 (twelve) hours. 60 tablet 0   ondansetron (ZOFRAN ODT) 4 MG disintegrating tablet 74m ODT q4 hours prn nausea/vomit (Patient not taking: Reported on 11/09/2020) 15 tablet 1   ondansetron (ZOFRAN) 8 MG tablet Take 1 tablet (8 mg total) by mouth every 8 (eight) hours as needed. Start on the third day after cisplatin chemotherapy. 30 tablet 1   oxybutynin (DITROPAN XL) 15 MG 24 hr tablet Take 15 mg by mouth daily.     Oxycodone HCl 10 MG TABS Take 1 tablet (10 mg total) by mouth every 2 (two) hours as needed. 60 tablet 0   Pentosan Polysulfate Sodium (ELMIRON PO) Take 200 mg by mouth 2 (two) times daily.     polyethylene glycol (MIRALAX / GLYCOLAX) 17 g packet Take 17 g by mouth daily.     Probiotic Product (PROBIOTIC PO) Take 1 tablet by mouth daily.     prochlorperazine (COMPAZINE) 10 MG tablet Take 1 tablet (10 mg total) by mouth every 6 (six) hours as needed (Nausea or vomiting). 30 tablet 1   senna-docusate (SENOKOT-S) 8.6-50 MG tablet Take 2 tablets by mouth  at bedtime. For AFTER surgery, do not take if having diarrhea (Patient not taking: Reported on 11/09/2020) 30 tablet 0   TURMERIC CURCUMIN PO Take 300 mg by mouth daily.     vitamin E 180 MG (400 UNITS) capsule Take 400 Units by mouth daily.     No current facility-administered medications for this visit.    REVIEW OF SYSTEMS:   Constitutional:  Denies fevers, chills or abnormal night sweats Eyes: Denies blurriness of vision, double vision or watery eyes Ears, nose, mouth, throat, and face: Denies mucositis or sore throat Respiratory: Denies cough, dyspnea or wheezes Cardiovascular: Denies palpitation, chest discomfort or lower extremity swelling Gastrointestinal:  Denies nausea, heartburn or change in bowel habits Skin: Denies abnormal skin rashes Lymphatics: Denies new lymphadenopathy or easy bruising Neurological:Denies numbness, tingling or new weaknesses Behavioral/Psych: Mood is stable, no new changes  All other systems were reviewed with the patient and are negative.  PHYSICAL EXAMINATION: ECOG PERFORMANCE STATUS: 2 - Symptomatic, <50% confined to bed  Vitals:   11/13/20 0852  BP: 118/75  Pulse: 87  Resp: 18  Temp: 98.8 F (37.1 C)  SpO2: 95%   Filed Weights   11/13/20 0852  Weight: 126 lb 3.2 oz (57.2 kg)    GENERAL:alert, no distress and comfortable SKIN: skin color, texture, turgor are normal, no rashes or significant lesions EYES: normal, conjunctiva are pink and non-injected, sclera clear OROPHARYNX:no exudate, no erythema and lips, buccal mucosa, and tongue normal  NECK: supple, thyroid normal size, non-tender, without nodularity LYMPH:  no palpable lymphadenopathy in the cervical, axillary or inguinal LUNGS: clear to auscultation and percussion with normal breathing effort HEART: regular rate & rhythm and no murmurs and no lower extremity edema ABDOMEN:abdomen soft, non-tender and normal bowel sounds Musculoskeletal:no cyanosis of digits and no clubbing  PSYCH: alert & oriented x 3 with fluent speech NEURO: no focal motor/sensory deficits  LABORATORY DATA:  I have reviewed the data as listed Lab Results  Component Value Date   WBC 11.6 (H) 11/02/2020   HGB 11.1 (L) 11/02/2020   HCT 35.0 (L) 11/02/2020   MCV 98.0 11/02/2020   PLT 424 (H) 11/02/2020   Recent Labs    06/29/20 0954  07/07/20 1121 09/12/20 2141  NA 140 138 138  K 4.0 3.7 3.7  CL 103 104 103  CO2 '26 26 28  ' GLUCOSE 84 98 113*  BUN '12 14 10  ' CREATININE 0.77 0.71 0.63  CALCIUM 9.4 9.6 9.1  GFRNONAA >60 >60 >60  PROT  --  7.3 6.6  ALBUMIN  --  4.3 3.9  AST  --  20 13*  ALT  --  21 12  ALKPHOS  --  49 43  BILITOT  --  0.6 1.3*    RADIOGRAPHIC STUDIES: I have personally reviewed the radiological images as listed and agreed with the findings in the report. NM PET Image Restage (PS) Skull Base to Thigh (F-18 FDG)  Result Date: 11/04/2020 CLINICAL DATA:  Subsequent treatment strategy for vulvar carcinoma. EXAM: NUCLEAR MEDICINE PET SKULL BASE TO THIGH TECHNIQUE: 6.28 mCi F-18 FDG was injected intravenously. Full-ring PET imaging was performed from the skull base to thigh after the radiotracer. CT data was obtained and used for attenuation correction and anatomic localization. Fasting blood glucose: 117 mg/dl COMPARISON:  PET-CT 08/08/2020 FINDINGS: Mediastinal blood pool activity: SUV max 1.76 Liver activity: SUV max NA NECK: No hypermetabolic lymph nodes in the neck. Incidental CT findings: none CHEST: No hypermetabolic mediastinal or hilar nodes.  No suspicious pulmonary nodules on the CT scan. No breast masses, supraclavicular or axillary adenopathy. Incidental CT findings: Stable atherosclerotic calcifications involving the aorta and coronary arteries. ABDOMEN/PELVIS: No hepatic or adrenal gland lesions. No abdominal lymphadenopathy. The vulvar lesions is slightly less apparent on the CT scan but difficult to measure. SUV max is 17.76 and was previously 18.95. The large left inguinal fluid collection is markedly smaller. 15 mm left inguinal lymph node on image 160/4 has an SUV max of 10.85. Previously measured 16.5 mm and had an SUV max of 11.35 The largest right inguinal node measures 13 mm on image 168/4. This previously measured 12 mm. SUV max is 12.65 and was previously 8.67. Two other smaller right inguinal  nodes are hypermetabolic. Pelvic sidewall lymph nodes are stable in size. Right-sided node on image 155/4 has an SUV max of 5.71. This was previously 3.36. Foci of hypermetabolism near iliac arteries without definite CT correlate. This is likely due to the ureters. Incidental CT findings: Stable vascular calcifications. Status post cholecystectomy. SKELETON: No significant bony findings. Incidental CT findings: none IMPRESSION: 1. No significant PET-CT findings for regression of tumor or adenopathy. 2. No new chest, abdominal or osseous metastatic disease. Electronically Signed   By: Marijo Sanes M.D.   On: 11/04/2020 18:14

## 2020-11-13 NOTE — Assessment & Plan Note (Signed)
She has severe, uncontrolled pain We discussed stepwise pain management I recommend the patient to refrain from taking too much ibuprofen I recommend oxycodone every 3 hours as needed I recommend the patient to document pain control with each dose I plan to escalate the dose of morphine sulfate in the future depending on how much oxycodone she is taking We discussed potential side effects and medication refill policy

## 2020-11-13 NOTE — Assessment & Plan Note (Signed)
We discussed the importance of preventive care and reviewed the vaccination programs. She does not have any prior allergic reactions to influenza vaccination. She agrees to proceed with influenza vaccination today and we will administer it today at the clinic.  

## 2020-11-13 NOTE — Progress Notes (Signed)
START OFF PATHWAY REGIMEN - Other   [id number]:Bevacizumab 15 mg/kg IV D1 + Cisplatin 50 mg/m2 IV D1 + Paclitaxel 175 mg/m2 IV D1 q21 Days:   A cycle is every 21 days:     Bevacizumab-xxxx      Paclitaxel      Cisplatin   **Always confirm dose/schedule in your pharmacy ordering system**  Patient Characteristics: Intent of Therapy: Curative Intent, Not Discussed with Patient

## 2020-11-13 NOTE — Telephone Encounter (Signed)
Left a message with port placement appointment and instructions for 11/15/20.  Requested a return call to confirm.

## 2020-11-13 NOTE — Assessment & Plan Note (Signed)
We discussed the the approach with systemic treatment I reviewed the current NCCN guidelines with the patient and family The patient desires aggressive approach We discussed the risk, benefits, side effects of combination chemotherapy with cisplatin, paclitaxel and bevacizumab and she is in agreement to proceed I recommend port placement and chemo education class I will hold bevacizumab for cycle 1 and will add for future treatment to allow wound healing after port placement I plan to see her weekly in the first month of treatment for aggressive supportive care

## 2020-11-13 NOTE — Telephone Encounter (Signed)
Pt has been scheduled for injection on 10/10.

## 2020-11-14 ENCOUNTER — Telehealth: Payer: Self-pay

## 2020-11-14 ENCOUNTER — Other Ambulatory Visit: Payer: Self-pay | Admitting: Radiology

## 2020-11-14 NOTE — Telephone Encounter (Signed)
Called regarding mychart message. She agreed to keep infusion appt as scheduled. In the future she would Friday appts.

## 2020-11-15 ENCOUNTER — Ambulatory Visit (HOSPITAL_COMMUNITY)
Admission: RE | Admit: 2020-11-15 | Discharge: 2020-11-15 | Disposition: A | Discharge status: Home / Self Care | Payer: 59 | Source: Ambulatory Visit | Attending: Diagnostic Radiology | Admitting: Diagnostic Radiology

## 2020-11-15 ENCOUNTER — Telehealth: Payer: Self-pay | Admitting: Hematology and Oncology

## 2020-11-15 ENCOUNTER — Encounter (HOSPITAL_COMMUNITY): Payer: Self-pay

## 2020-11-15 ENCOUNTER — Other Ambulatory Visit: Payer: Self-pay | Admitting: Hematology and Oncology

## 2020-11-15 DIAGNOSIS — Z79899 Other long term (current) drug therapy: Secondary | ICD-10-CM | POA: Insufficient documentation

## 2020-11-15 DIAGNOSIS — C519 Malignant neoplasm of vulva, unspecified: Secondary | ICD-10-CM | POA: Diagnosis not present

## 2020-11-15 DIAGNOSIS — I10 Essential (primary) hypertension: Secondary | ICD-10-CM | POA: Insufficient documentation

## 2020-11-15 DIAGNOSIS — E785 Hyperlipidemia, unspecified: Secondary | ICD-10-CM | POA: Insufficient documentation

## 2020-11-15 DIAGNOSIS — Z8249 Family history of ischemic heart disease and other diseases of the circulatory system: Secondary | ICD-10-CM | POA: Diagnosis not present

## 2020-11-15 DIAGNOSIS — Z96651 Presence of right artificial knee joint: Secondary | ICD-10-CM | POA: Insufficient documentation

## 2020-11-15 DIAGNOSIS — Z8616 Personal history of COVID-19: Secondary | ICD-10-CM | POA: Insufficient documentation

## 2020-11-15 DIAGNOSIS — Z9049 Acquired absence of other specified parts of digestive tract: Secondary | ICD-10-CM | POA: Insufficient documentation

## 2020-11-15 DIAGNOSIS — R102 Pelvic and perineal pain: Secondary | ICD-10-CM

## 2020-11-15 DIAGNOSIS — Z87891 Personal history of nicotine dependence: Secondary | ICD-10-CM | POA: Diagnosis not present

## 2020-11-15 HISTORY — PX: IR IMAGING GUIDED PORT INSERTION: IMG5740

## 2020-11-15 MED ORDER — LIDOCAINE HCL (PF) 1 % IJ SOLN
INTRAMUSCULAR | Status: DC | PRN
  Administered 2020-11-15: 15 mL via SUBCUTANEOUS

## 2020-11-15 MED ORDER — LIDOCAINE-EPINEPHRINE 1 %-1:100000 IJ SOLN
INTRAMUSCULAR | Status: AC
  Filled 2020-11-15: qty 1

## 2020-11-15 MED ORDER — MIDAZOLAM HCL 2 MG/2ML IJ SOLN
INTRAMUSCULAR | Status: AC
  Filled 2020-11-15: qty 4

## 2020-11-15 MED ORDER — HEPARIN SOD (PORK) LOCK FLUSH 100 UNIT/ML IV SOLN
INTRAVENOUS | Status: DC | PRN
  Administered 2020-11-15: 500 [IU] via INTRAVENOUS

## 2020-11-15 MED ORDER — FENTANYL CITRATE (PF) 100 MCG/2ML IJ SOLN
INTRAMUSCULAR | Status: DC | PRN
  Administered 2020-11-15: 25 ug via INTRAVENOUS
  Administered 2020-11-15 (×2): 50 ug via INTRAVENOUS

## 2020-11-15 MED ORDER — HEPARIN SOD (PORK) LOCK FLUSH 100 UNIT/ML IV SOLN
INTRAVENOUS | Status: AC
  Filled 2020-11-15: qty 5

## 2020-11-15 MED ORDER — MORPHINE SULFATE ER 15 MG PO TBCR: EXTENDED_RELEASE_TABLET | ORAL | 0 refills | Status: DC

## 2020-11-15 MED ORDER — MIDAZOLAM HCL 2 MG/2ML IJ SOLN
INTRAMUSCULAR | Status: DC | PRN
  Administered 2020-11-15 (×3): 1 mg via INTRAVENOUS

## 2020-11-15 MED ORDER — FENTANYL CITRATE (PF) 100 MCG/2ML IJ SOLN
INTRAMUSCULAR | Status: AC
  Filled 2020-11-15: qty 4

## 2020-11-15 MED ORDER — LIDOCAINE HCL 1 % IJ SOLN
INTRAMUSCULAR | Status: AC
  Filled 2020-11-15: qty 20

## 2020-11-15 MED ORDER — SODIUM CHLORIDE 0.9 % IV SOLN: INTRAVENOUS | Status: DC

## 2020-11-15 MED ORDER — LIDOCAINE-EPINEPHRINE 1 %-1:100000 IJ SOLN
INTRAMUSCULAR | Status: DC | PRN
  Administered 2020-11-15: 10 mL

## 2020-11-15 NOTE — Procedures (Signed)
Interventional Radiology Procedure:   Indications: Vulvar cancer  Procedure: Port placement  Findings: Right jugular port, tip in lower SVC.  Complications: None     EBL: Minimal, less than 10 ml  Plan: Discharge in one hour.  Keep port site and incisions dry for at least 24 hours.     Dalvin Clipper R. Anselm Pancoast, MD  Pager: [phone]

## 2020-11-15 NOTE — Telephone Encounter (Signed)
I spoke with the patient over the phone to review her pain management Her pain is getting slightly better controlled today She took 5 oxycodone yesterday, along with her schedule morphine sulfate and ibuprofen She felt that if she takes ibuprofen with her oxycodone, the benefit lasts a little longer She has a little bit of more sedation during daytime but still able to function She denies constipation She has about 24 pills of oxycodone left I discussed changes with the patient as follows 1) she will continue scheduled morphine but she will take 2 morphine sulfate tablets in the evening 2) we will resume 300 mg of gabapentin in the evening 3) I recommend scheduled Tylenol 1000 mg 4 times a day 4) I recommend she takes oxycodone as needed along with 600 mg of ibuprofen  On Friday, when she returns for chemo class, I will get my nursing staff to check on pill count to make sure that she has enough to last through the weekend If not, I will refill her oxycodone I will see her again next week for further follow-up

## 2020-11-15 NOTE — Progress Notes (Signed)
Pharmacist Chemotherapy Monitoring - Initial Assessment    Anticipated start date: 11/22/20   The following has been reviewed per standard work regarding the patient's treatment regimen: The patient's diagnosis, treatment plan and drug doses, and organ/hematologic function Lab orders and baseline tests specific to treatment regimen  The treatment plan start date, drug sequencing, and pre-medications Prior authorization status  Patient's documented medication list, including drug-drug interaction screen and prescriptions for anti-emetics and supportive care specific to the treatment regimen The drug concentrations, fluid compatibility, administration routes, and timing of the medications to be used The patient's access for treatment and lifetime cumulative dose history, if applicable  The patient's medication allergies and previous infusion related reactions, if applicable   Changes made to treatment plan:  Drug sequencing, drug offset times, and Bevacizumab and Taxol infusion times.   Follow up needed:  Pending authorization for treatment  Labs   Acquanetta Belling, [facility], BCPS, BCOP 11/15/2020  2:03 PM

## 2020-11-15 NOTE — H&P (Signed)
Chief Complaint: Patient was seen in consultation today for vulvar cancer  Referring Physician(s): [facility]  Supervising Physician: Markus Daft  Patient Status: [name] - Out-pt  History of Present Illness: Christina [name] is a 60 y.o. 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  She now has plans for chemo and radiation.  She is in need of durable venous access for chemo initiation and presents to [facility] today for Port-A-Cath placement at the request of Dr. Alvy Bimler.   Patient presents to [facility] today complaining of perineal pain.  She reports difficulty with pain management at home and did not take her scheduled Ibuprofen this AM due to procedure today.  She is otherwise stable and in her usual state of health. She has been NPO.  Her husband is available for transportation and care at home.   Past Medical History:  Diagnosis Date   Arthritis    Chicken pox    Chronic back pain    History of radiation therapy    Pelvis 08/03/20-10/02/20- Dr. Gery Pray   HLD (hyperlipidemia)    diet controlled   Hypertension    Recurrent upper respiratory infection (URI)    Seasonal allergies    Vulvar cancer [facility])     Past Surgical History:  Procedure Laterality Date   BLADDER SUSPENSION     CHOLECYSTECTOMY     COLONOSCOPY     INGUINAL LYMPHADENECTOMY Bilateral 07/11/2020   Procedure: INGUINAL LYMPHADENECTOMY;  Surgeon: Everitt Amber, MD;  Location: [facility];  Service: Gynecology;  Laterality: Bilateral;   TOTAL KNEE ARTHROPLASTY Right 04/25/2020   Procedure: RIGHT TOTAL KNEE ARTHROPLASTY;  Surgeon: Meredith Pel, MD;  Location: [place];  Service: Orthopedics;  Laterality: Right;   WISDOM TOOTH EXTRACTION      Allergies: Patient has no known allergies.  Medications: Prior to Admission medications   Medication Sig Start Date End Date Taking? Authorizing Provider  bisacodyl  (DULCOLAX) 5 MG EC tablet Take 5 mg by mouth daily.   Yes [provider]  cetirizine (ZYRTEC) 10 MG tablet TAKE 1 TO 2 TABLETS BY MOUTH DAILY AS NEEDED Patient taking differently: Take 10 mg by mouth daily. 03/30/20  Yes Valentina Shaggy, MD  Cholecalciferol (VITAMIN D) 50 MCG (2000 UT) tablet Take 2,000 Units by mouth daily.   Yes [provider]  Emollient (AQUAPHOR EX) Apply 1 application topically daily.   Yes [provider]  ibuprofen (ADVIL) 200 MG tablet Take 600 mg by mouth every 6 (six) hours as needed for moderate pain.   Yes [provider]  ipratropium (ATROVENT) 0.06 % nasal spray USE 2 SPRAYS IN EACH NOSTRIL THREE TIMES DAILY Patient taking differently: Place 2 sprays into both nostrils daily as needed for rhinitis. 08/04/20  Yes Valentina Shaggy, MD  levocetirizine (XYZAL) 5 MG tablet Take 1 tablet (5 mg total) by mouth every evening. Patient taking differently: Take 5 mg by mouth daily as needed for allergies. 04/02/19  Yes Valentina Shaggy, MD  lidocaine (XYLOCAINE) 5 % ointment Apply 1 application topically 4 (four) times daily as needed for moderate pain. 08/22/20  Yes Gery Pray, MD  lidocaine-prilocaine (EMLA) cream Apply to affected area once 11/13/20  Yes Alvy Bimler, Ni, MD  ondansetron (ZOFRAN ODT) 4 MG disintegrating tablet 4mg  ODT q4 hours prn nausea/vomit 09/26/20  Yes Eppie Gibson, MD  ondansetron (ZOFRAN) 8 MG tablet Take 1 tablet (8 mg total)  by mouth every 8 (eight) hours as needed. Start on the third day after cisplatin chemotherapy. 11/13/20  Yes Gorsuch, Ni, MD  oxybutynin (DITROPAN XL) 15 MG 24 hr tablet Take 15 mg by mouth daily. 10/30/20  Yes [provider]  Oxycodone HCl 10 MG TABS Take 1 tablet (10 mg total) by mouth every 2 (two) hours as needed. 11/08/20  Yes Everitt Amber, MD  polyethylene glycol (MIRALAX / GLYCOLAX) 17 g packet Take 17 g by mouth daily.   Yes [provider]  Probiotic Product  (PROBIOTIC PO) Take 1 capsule by mouth daily.   Yes [provider]  prochlorperazine (COMPAZINE) 10 MG tablet Take 1 tablet (10 mg total) by mouth every 6 (six) hours as needed (Nausea or vomiting). 11/13/20  Yes Gorsuch, Ni, MD  gabapentin (NEURONTIN) 300 MG capsule Take 300 mg by mouth at bedtime.    [provider]  ibuprofen (ADVIL) 600 MG tablet Take 1 tablet (600 mg total) by mouth every 6 (six) hours as needed for moderate pain. Patient not taking: Reported on 11/14/2020 10/03/20   Gery Pray, MD  lidocaine (XYLOCAINE) 2 % solution Apply small amount over sore lesion up to every 4 hours as needed for pain Patient not taking: No sig reported 08/17/20   Gery Pray, MD  LORazepam (ATIVAN) 0.5 MG tablet Take 1 tablet (0.5 mg total) by mouth every 8 (eight) hours as needed for anxiety. Do not take with immediate release pain meds, do not take and drive. As needed for anxiety Patient not taking: No sig reported 11/10/20   [name] D, NP  morphine (MS CONTIN) 15 MG 12 hr tablet Take 1 tablet in the morning and 2 tablets at night 11/15/20   Alvy Bimler, Ni, MD  senna-docusate (SENOKOT-S) 8.6-50 MG tablet Take 2 tablets by mouth at bedtime. For AFTER surgery, do not take if having diarrhea Patient not taking: No sig reported 07/07/20   Dorothyann Gibbs, NP     Family History  Problem Relation Age of Onset   Hyperlipidemia Mother    Hypertension Mother    Stroke Mother    Breast cancer Mother    Prostate cancer Father    Hypertension Maternal Grandmother    Hypertension Paternal Grandmother    Stroke Paternal Grandmother    Allergic rhinitis Neg Hx    Asthma Neg Hx    Ovarian cancer Neg Hx    Uterine cancer Neg Hx    Colon cancer Neg Hx    Pancreatic cancer Neg Hx     Social History   Socioeconomic History   Marital status: Married    Spouse name: Not on file   Number of children: Not on file   Years of education: Not on file   Highest education level: Not on  file  Occupational History   Occupation: retired  Tobacco Use   Smoking status: Former    Packs/day: 0.50    Years: 2.00    Pack years: 1.00    Types: Cigarettes   Smokeless tobacco: Never   Tobacco comments:    Smoke in 20's  Vaping Use   Vaping Use: Never used  Substance and Sexual Activity   Alcohol use: Not Currently   Drug use: No   Sexual activity: Yes    Partners: Male    Birth control/protection: Post-menopausal  Other Topics Concern   Not on file  Social History Narrative   Not on file   Social Determinants of Health  Financial Resource Strain: Not on file  Food Insecurity: Not on file  Transportation Needs: Not on file  Physical Activity: Not on file  Stress: Not on file  Social Connections: Not on file     Review of Systems: A 12 point ROS discussed and pertinent positives are indicated in the HPI above.  All other systems are negative.  Review of Systems  Constitutional:  Negative for fatigue and fever.  Respiratory:  Negative for cough and shortness of breath.   Cardiovascular:  Negative for chest pain.  Gastrointestinal:  Positive for rectal pain. Negative for abdominal pain, diarrhea, nausea and vomiting.  Genitourinary:  Positive for pelvic pain and vaginal pain.  Musculoskeletal:  Negative for back pain.  Psychiatric/Behavioral:  Negative for behavioral problems and confusion.    Vital Signs: BP (!) 158/80   Pulse 83   Temp 98.4 F (36.9 C) (Oral)   Resp 12   Ht 5\' 1"  (1.549 m)   Wt 126 lb (57.2 kg)   SpO2 99%   BMI 23.81 kg/m   Physical Exam Vitals and nursing note reviewed.  Constitutional:      General: She is not in acute distress.    Appearance: Normal appearance. She is not ill-appearing.  HENT:     Mouth/Throat:     Mouth: Mucous membranes are moist.     Pharynx: Oropharynx is clear.  Cardiovascular:     Rate and Rhythm: Normal rate and regular rhythm.  Pulmonary:     Effort: Pulmonary effort is normal. No respiratory  distress.     Breath sounds: Normal breath sounds.  Abdominal:     General: Abdomen is flat.     Palpations: Abdomen is soft.  Skin:    General: Skin is warm and dry.  Neurological:     General: No focal deficit present.     Mental Status: She is alert and oriented to person, place, and time. Mental status is at baseline.  Psychiatric:        Mood and Affect: Mood normal.        Behavior: Behavior normal.        Thought Content: Thought content normal.        Judgment: Judgment normal.     MD Evaluation Airway: WNL Heart: WNL Abdomen: WNL Chest/ Lungs: WNL ASA  Classification: 3 Mallampati/Airway Score: Two   Imaging: NM PET Image Restage (PS) Skull Base to Thigh (F-18 FDG)  Result Date: 11/04/2020 CLINICAL DATA:  Subsequent treatment strategy for vulvar carcinoma. EXAM: NUCLEAR MEDICINE PET SKULL BASE TO THIGH TECHNIQUE: 6.28 mCi F-18 FDG was injected intravenously. Full-ring PET imaging was performed from the skull base to thigh after the radiotracer. CT data was obtained and used for attenuation correction and anatomic localization. Fasting blood glucose: 117 mg/dl COMPARISON:  PET-CT 08/08/2020 FINDINGS: Mediastinal blood pool activity: SUV max 1.76 Liver activity: SUV max NA NECK: No hypermetabolic lymph nodes in the neck. Incidental CT findings: none CHEST: No hypermetabolic mediastinal or hilar nodes. No suspicious pulmonary nodules on the CT scan. No breast masses, supraclavicular or axillary adenopathy. Incidental CT findings: Stable atherosclerotic calcifications involving the aorta and coronary arteries. ABDOMEN/PELVIS: No hepatic or adrenal gland lesions. No abdominal lymphadenopathy. The vulvar lesions is slightly less apparent on the CT scan but difficult to measure. SUV max is 17.76 and was previously 18.95. The large left inguinal fluid collection is markedly smaller. 15 mm left inguinal lymph node on image 160/4 has an SUV max of 10.85. Previously measured  16.5 mm and  had an SUV max of 11.35 The largest right inguinal node measures 13 mm on image 168/4. This previously measured 12 mm. SUV max is 12.65 and was previously 8.67. Two other smaller right inguinal nodes are hypermetabolic. Pelvic sidewall lymph nodes are stable in size. Right-sided node on image 155/4 has an SUV max of 5.71. This was previously 3.36. Foci of hypermetabolism near iliac arteries without definite CT correlate. This is likely due to the ureters. Incidental CT findings: Stable vascular calcifications. Status post cholecystectomy. SKELETON: No significant bony findings. Incidental CT findings: none IMPRESSION: 1. No significant PET-CT findings for regression of tumor or adenopathy. 2. No new chest, abdominal or osseous metastatic disease. Electronically Signed   By: Marijo Sanes M.D.   On: 11/04/2020 18:14    Labs:  CBC: Recent Labs    04/20/20 0946 07/07/20 1121 09/12/20 2141 11/02/20 1228  WBC 7.5 8.5 5.5 11.6*  HGB 14.4 13.1 10.6* 11.1*  HCT 42.5 41.2 33.2* 35.0*  PLT 409* 434* 384 424*    COAGS: Recent Labs    11/02/20 1228  INR 1.0    BMP: Recent Labs    04/20/20 0946 06/29/20 0954 07/07/20 1121 09/12/20 2141  NA 138 140 138 138  K 3.8 4.0 3.7 3.7  CL 103 103 104 103  CO2 26 26 26 28   GLUCOSE 98 84 98 113*  BUN 12 12 14 10   CALCIUM 9.4 9.4 9.6 9.1  CREATININE 0.77 0.77 0.71 0.63  GFRNONAA >60 >60 >60 >60    LIVER FUNCTION TESTS: Recent Labs    07/07/20 1121 09/12/20 2141  BILITOT 0.6 1.3*  AST 20 13*  ALT 21 12  ALKPHOS 49 43  PROT 7.3 6.6  ALBUMIN 4.3 3.9    TUMOR MARKERS: No results for input(s): AFPTM, CEA, CA199, CHROMGRNA in the last 8760 hours.  Assessment and Plan: Patient with past medical history of vulvar cancer presents in need of durable venous access for upcoming chemotherapy.  IR consulted for Port-A-Cath placement at the request of Dr. Alvy Bimler. Case reviewed by Dr. Anselm Pancoast who approves patient for procedure.  Patient presents  today in their usual state of health.  She has been NPO and is not currently on blood thinners.   Risks and benefits of image guided port-a-catheter placement was discussed with the patient including, but not limited to bleeding, infection, pneumothorax, or fibrin sheath development and need for additional procedures.  All of the patient's questions were answered, patient is agreeable to proceed. Consent signed and in chart.   Thank you for this interesting consult.  I greatly enjoyed meeting Julliana Whitmyer Ninh and look forward to participating in their care.  A copy of this report was sent to the requesting provider on this date.  Electronically Signed: Docia Barrier, PA 11/15/2020, 12:14 PM   I spent a total of  30 Minutes   in face to face in clinical consultation, greater than 50% of which was counseling/coordinating care for vulvar cancer.

## 2020-11-17 ENCOUNTER — Other Ambulatory Visit (HOSPITAL_COMMUNITY): Payer: Self-pay

## 2020-11-17 ENCOUNTER — Other Ambulatory Visit: Payer: Self-pay | Admitting: Hematology and Oncology

## 2020-11-17 ENCOUNTER — Encounter: Payer: Self-pay | Admitting: Hematology and Oncology

## 2020-11-17 ENCOUNTER — Other Ambulatory Visit: Payer: Self-pay

## 2020-11-17 ENCOUNTER — Telehealth: Payer: Self-pay | Admitting: Oncology

## 2020-11-17 ENCOUNTER — Inpatient Hospital Stay: Payer: 59

## 2020-11-17 DIAGNOSIS — R102 Pelvic and perineal pain: Secondary | ICD-10-CM

## 2020-11-17 DIAGNOSIS — C519 Malignant neoplasm of vulva, unspecified: Secondary | ICD-10-CM

## 2020-11-17 MED ORDER — IBUPROFEN 600 MG PO TABS
600.0000 mg | ORAL_TABLET | Freq: Four times a day (QID) | ORAL | 0 refills | Status: DC | PRN
  Filled 2020-11-17: qty 30, 8d supply, fill #0

## 2020-11-17 MED ORDER — OXYCODONE HCL 15 MG PO TABS
15.0000 mg | ORAL_TABLET | ORAL | 0 refills | Status: DC | PRN
  Filled 2020-11-17: qty 60, 8d supply, fill #0

## 2020-11-17 NOTE — Telephone Encounter (Signed)
Per Dr. Alvy Bimler, increase morphine 15 mg tablets to 2 tablets BID and oxycodone to 15 mg.  Can continue taking ibuprofen 600 mg for now.  Prescriptions were sent to [facility] and Christina [name] has been given a printed copy of her new medication list.  Also advised her to bring all pill bottles to her next appointment.  She verbalized understanding and agreement.

## 2020-11-17 NOTE — Progress Notes (Signed)
Met with patient and daughter at registration to introduce myself as Arboriculturist and to offer available resources.  Discussed one-time $1000 Radio broadcast assistant to assist with personal expenses while going through treatment.  Gave her my card if interested  in applying and for any additional financial questions or concerns.

## 2020-11-17 NOTE — Telephone Encounter (Signed)
Called Christina [name] and she said she is feeling better.  She was able to sleep through the night last night. She said her pain was a 3-4 during the day and 7-8 last night.  She is taking oxycodone 10 mg q 4 hours and has 18 tablets left.  She thinks she may need a refill.   She is taking Morphine as directed - in the morning and 2 tablets in the evening.  She is also taking 300 mg of gabapentin in the evening.    She has tried alternating Tylenol with ibuprofen and said the Tylenol is not helping.  She notices it especially when she has to urinate or have a bowel movement.  The ibuprofen is more helpful.  She is requesting a prescription for 600 mg ibuprofen to be sent to [facility]. Dr. Alvy Bimler has been notified.

## 2020-11-20 ENCOUNTER — Inpatient Hospital Stay: Payer: 59 | Attending: Gynecologic Oncology

## 2020-11-20 ENCOUNTER — Encounter: Payer: Self-pay | Admitting: Gynecologic Oncology

## 2020-11-20 ENCOUNTER — Other Ambulatory Visit: Payer: Self-pay | Admitting: Hematology and Oncology

## 2020-11-20 ENCOUNTER — Inpatient Hospital Stay (HOSPITAL_BASED_OUTPATIENT_CLINIC_OR_DEPARTMENT_OTHER): Payer: 59 | Admitting: Hematology and Oncology

## 2020-11-20 ENCOUNTER — Other Ambulatory Visit: Payer: Self-pay

## 2020-11-20 ENCOUNTER — Encounter: Payer: Self-pay | Admitting: Hematology and Oncology

## 2020-11-20 ENCOUNTER — Other Ambulatory Visit (HOSPITAL_COMMUNITY): Payer: Self-pay

## 2020-11-20 ENCOUNTER — Telehealth: Payer: Self-pay | Admitting: Oncology

## 2020-11-20 DIAGNOSIS — R102 Pelvic and perineal pain unspecified side: Secondary | ICD-10-CM

## 2020-11-20 DIAGNOSIS — G893 Neoplasm related pain (acute) (chronic): Secondary | ICD-10-CM | POA: Insufficient documentation

## 2020-11-20 DIAGNOSIS — Z5111 Encounter for antineoplastic chemotherapy: Secondary | ICD-10-CM | POA: Insufficient documentation

## 2020-11-20 DIAGNOSIS — R634 Abnormal weight loss: Secondary | ICD-10-CM | POA: Diagnosis not present

## 2020-11-20 DIAGNOSIS — Z79899 Other long term (current) drug therapy: Secondary | ICD-10-CM | POA: Diagnosis not present

## 2020-11-20 DIAGNOSIS — N9089 Other specified noninflammatory disorders of vulva and perineum: Secondary | ICD-10-CM

## 2020-11-20 DIAGNOSIS — C519 Malignant neoplasm of vulva, unspecified: Secondary | ICD-10-CM

## 2020-11-20 LAB — CBC WITH DIFFERENTIAL (CANCER CENTER ONLY)
Abs Immature Granulocytes: 0.04 10*3/uL (ref 0.00–0.07)
Basophils Absolute: 0 10*3/uL (ref 0.0–0.1)
Basophils Relative: 0 %
Eosinophils Absolute: 0.2 10*3/uL (ref 0.0–0.5)
Eosinophils Relative: 2 %
HCT: 35.6 % — ABNORMAL LOW (ref 36.0–46.0)
Hemoglobin: 11.4 g/dL — ABNORMAL LOW (ref 12.0–15.0)
Immature Granulocytes: 0 %
Lymphocytes Relative: 6 %
Lymphs Abs: 0.6 10*3/uL — ABNORMAL LOW (ref 0.7–4.0)
MCH: 30.2 pg (ref 26.0–34.0)
MCHC: 32 g/dL (ref 30.0–36.0)
MCV: 94.4 fL (ref 80.0–100.0)
Monocytes Absolute: 0.9 10*3/uL (ref 0.1–1.0)
Monocytes Relative: 7 %
Neutro Abs: 9.8 10*3/uL — ABNORMAL HIGH (ref 1.7–7.7)
Neutrophils Relative %: 85 %
Platelet Count: 380 10*3/uL (ref 150–400)
RBC: 3.77 MIL/uL — ABNORMAL LOW (ref 3.87–5.11)
RDW: 12.9 % (ref 11.5–15.5)
WBC Count: 11.6 10*3/uL — ABNORMAL HIGH (ref 4.0–10.5)
nRBC: 0 % (ref 0.0–0.2)

## 2020-11-20 LAB — CMP (CANCER CENTER ONLY)
ALT: 16 U/L (ref 0–44)
AST: 15 U/L (ref 15–41)
Albumin: 3.5 g/dL (ref 3.5–5.0)
Alkaline Phosphatase: 62 U/L (ref 38–126)
Anion gap: 9 (ref 5–15)
BUN: 11 mg/dL (ref 8–23)
CO2: 27 mmol/L (ref 22–32)
Calcium: 9.6 mg/dL (ref 8.9–10.3)
Chloride: 105 mmol/L (ref 98–111)
Creatinine: 0.68 mg/dL (ref 0.44–1.00)
GFR, Estimated: >60 mL/min (ref >60)
Glucose, Bld: 105 mg/dL — ABNORMAL HIGH (ref 70–99)
Potassium: 4.4 mmol/L (ref 3.5–5.1)
Sodium: 141 mmol/L (ref 135–145)
Total Bilirubin: 0.3 mg/dL (ref 0.3–1.2)
Total Protein: 7.3 g/dL (ref 6.5–8.1)

## 2020-11-20 LAB — MAGNESIUM: Magnesium: 2 mg/dL (ref 1.7–2.4)

## 2020-11-20 MED ORDER — METRONIDAZOLE 250 MG PO TABS
250.0000 mg | ORAL_TABLET | Freq: Two times a day (BID) | ORAL | 0 refills | Status: AC
  Filled 2020-11-20: qty 20, 10d supply, fill #0

## 2020-11-20 MED ORDER — MORPHINE SULFATE ER 15 MG PO TBCR: EXTENDED_RELEASE_TABLET | ORAL | 0 refills | Status: DC

## 2020-11-20 NOTE — Telephone Encounter (Signed)
Called Christina [name] and discussed using peppermint oil on cotton balls and flagyl/charcoal for odor control. She verbalized understanding and agreement.

## 2020-11-20 NOTE — Assessment & Plan Note (Signed)
She has slightly better pain control with recent dose adjustment For now, she will continue MS Contin, 30 mg twice daily, on a scheduled basis In addition, she will use 15 mg oxycodone as needed along with ibuprofen or Tylenol for better pain control She will also continue to take gabapentin at nighttime So far, she does not have difficulties with constipation

## 2020-11-20 NOTE — Assessment & Plan Note (Signed)
This is due to her cancer I recommend her to use a spray bottle for personal hygiene I recommend activated charcoal to help suppress the overall We will continue supportive care

## 2020-11-20 NOTE — Assessment & Plan Note (Signed)
Clinically, she felt better Her pain is better controlled although not perfect I plan to slowly titrate up her pain medication as needed until we get her chemotherapy started I plan to see her weekly for the first month of treatment for aggressive supportive care She appears more motivated and optimistic today Her labs are satisfactory and we will proceed with treatment as scheduled this week

## 2020-11-20 NOTE — Progress Notes (Signed)
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[place]) Clinically, she felt better Her pain is better controlled although not perfect I plan to slowly titrate up her pain medication as needed until we get her chemotherapy started I plan to see her weekly for the first month of treatment for aggressive supportive care She appears more motivated and optimistic today Her labs are satisfactory and we will proceed with treatment as scheduled this week  Cancer associated pain She has slightly better pain control with recent dose adjustment For now, she will continue MS Contin, 30 mg twice daily, on a scheduled basis In addition, she will use 15 mg oxycodone as needed along with ibuprofen or Tylenol for better pain control She will also continue to take gabapentin at nighttime So far, she does not have difficulties with constipation  Vulvar odor This is due to her cancer I recommend her to use a spray bottle for personal hygiene I recommend activated charcoal to help suppress the overall We will continue supportive care  No orders of the defined types were placed in this encounter.   All questions were answered. The patient knows to call the clinic with any problems, questions or concerns. The total time spent in the appointment was 30 minutes encounter with patients including review of chart and various tests results, discussions about plan of care and coordination of care plan   Heath Lark, MD 11/20/2020 2:24 PM  INTERVAL HISTORY: Please see below for problem oriented charting. she returns for  follow-up and aggressive pain control, seen prior to chemotherapy She had successful port placement last week Her oral intake has somewhat improved Her pain control has slightly improved She denies constipation She is still bothered with difficulties with urination unless she takes ibuprofen She is trying to take less ibuprofen She show me pictures of her vulva region, consistent with disease She is bothered by vaginal discharge and odor  REVIEW OF SYSTEMS:   Constitutional: Denies fevers, chills or abnormal weight loss Eyes: Denies blurriness of vision Ears, nose, mouth, throat, and face: Denies mucositis or sore throat Respiratory: Denies cough, dyspnea or wheezes Cardiovascular: Denies palpitation, chest discomfort or lower extremity swelling Gastrointestinal:  Denies nausea, heartburn or change in bowel habits Skin: Denies abnormal skin rashes Lymphatics: Denies new lymphadenopathy or easy bruising Neurological:Denies numbness, tingling or new weaknesses Behavioral/Psych: Mood is stable, no new changes  All other systems were reviewed with the patient and are negative.  I have reviewed the past medical history, past surgical history, social history and family history with the patient and they are unchanged from previous note.  ALLERGIES:  has No Known Allergies.  MEDICATIONS:  Current Outpatient Medications  Medication Sig Dispense Refill   bisacodyl (DULCOLAX) 5 MG EC tablet Take 5 mg by mouth daily.     cetirizine (ZYRTEC) 10 MG tablet TAKE 1 TO 2 TABLETS BY MOUTH DAILY AS NEEDED (Patient taking differently: Take 10 mg by mouth daily.) 60 tablet 5   Cholecalciferol (VITAMIN D) 50 MCG (2000 UT) tablet Take 2,000 Units by mouth daily.     Emollient (AQUAPHOR EX) Apply 1 application topically daily.     gabapentin (NEURONTIN)  300 MG capsule Take 300 mg by mouth at bedtime.     ibuprofen (ADVIL) 600 MG tablet Take 1 tablet (600 mg total) by mouth every 6 (six) hours as needed for  moderate pain. 30 tablet 0   lidocaine-prilocaine (EMLA) cream Apply to affected area once 30 g 3   morphine (MS CONTIN) 15 MG 12 hr tablet Take 2 tablets in the morning and 2 tablets at night 60 tablet 0   ondansetron (ZOFRAN ODT) 4 MG disintegrating tablet 40m ODT q4 hours prn nausea/vomit 15 tablet 1   ondansetron (ZOFRAN) 8 MG tablet Take 1 tablet (8 mg total) by mouth every 8 (eight) hours as needed. Start on the third day after cisplatin chemotherapy. 30 tablet 1   oxybutynin (DITROPAN XL) 15 MG 24 hr tablet Take 15 mg by mouth daily.     oxyCODONE (ROXICODONE) 15 MG immediate release tablet Take 1 tablet (15 mg total) by mouth every 3 (three) hours as needed for severe pain. 60 tablet 0   polyethylene glycol (MIRALAX / GLYCOLAX) 17 g packet Take 17 g by mouth daily.     Probiotic Product (PROBIOTIC PO) Take 1 capsule by mouth daily.     prochlorperazine (COMPAZINE) 10 MG tablet Take 1 tablet (10 mg total) by mouth every 6 (six) hours as needed (Nausea or vomiting). 30 tablet 1   No current facility-administered medications for this visit.    SUMMARY OF ONCOLOGIC HISTORY: Oncology History Overview Note  PD-L1 CPS score is 15%   Squamous cell carcinoma of vulva ([place]  06/15/2020 Pathology Results   A. VULVA, BIOPSY:  -  Squamous cell carcinoma   06/16/2020 Initial Diagnosis   The patient began experiencing vulvar burning after her knee replacement in March 2022.  She mentioned this to her primary care physician who looked at it and was unclear about the diagnosis but recommended consultation with a gynecologist.  The patient did not have an established gynecologist and therefore made a new patient consultation with Dr. LDellis Filbert  Dr. LDellis Filbertsaw the patient on 06/16/2020 which revealed a squamous cell carcinoma of the vulva.  The tumor was well differentiated and keratinizing.  Her primary vulvar cancer was not felt to be amenable to primary resection as it was a 6cm lesion replacing the  perineal body and adherent with the anal sphincter.     06/30/2020 Imaging   Ct chest, abdomen and pelvis  1. No evidence of distant metastatic disease in the chest, abdomen or pelvis. 2. The patient's known cervical cancer is not well visualized. No gross bladder or anorectal involvement. No pelvic adenopathy. 3. Tiny right middle lobe nodule, likely benign. Attention on follow-up. 4.  Aortic Atherosclerosis (ICD10-I70.0).   07/03/2020 Initial Diagnosis   Squamous [place] carcinoma of [place]va ([place]   07/11/2020 Surgery   Surgery: bilateral inguinofemoral lymphadenectomy.   Surgeons:  RDonaciano Eva MD   Pathology: right and left inguinofemoral lymph nodes.   Operative findings: clinically suspicious medial left inguinal lymph node. 6cm perineal vulvar tumor.     07/11/2020 Pathology Results   A. LYMPH NODE, RIGHT INGUINAL FEMORAL, RESECTION:  - Metastatic keratinizing squamous cell carcinoma involving one of three lymph nodes (1/3)  - No evidence of extranodal extension   B. LYMPH NODE, LEFT INGUINAL FEMORAL, RESECTION:  - Metastatic keratinizing squamous cell carcinoma involving two of four lymph nodes (2/4)  - No evidence of extranodal extension    08/03/2020 - 10/02/2020 Radiation Therapy   Radiation Treatment Dates: 08/03/2020  through 10/02/2020 Site Technique Total Dose (Gy) Dose per Fx (Gy) Completed Fx Beam Energies  Vulva: Pelvis IMRT 50.4/50.4 1.8 28/28 6X  Vulva: Pelvis_Bst IMRT 12.6/12.6 1.8 7/7 6X  Vulva: Pelvis_Bst_2 IMRT 10/10 2 5/5 6X      Cumulative dose to the vulvar region was 63.0 Gy.  Cumulative dose to the pelvis 50.4 Gy.  The inguinal areas were boosted to a cumulative dose of 60.4 Gy.     08/08/2020 PET scan   1. Intense activity midline along the perineum/external genitalia with differential of FDG urine contamination versus malignancy of the external genitalia. 2. Bilateral mildly enlarged intensely hypermetabolic inguinal lymph nodes. Biopsy clips  adjacent to these lymph nodes. Findings concerning for metastatic inguinal adenopathy(large benign-appearing fluid collection in the LEFT groin is presumably a post surgical seroma). 3. No evidence of metastatic pelvic iliac lymphadenopathy or retroperitoneal periaortic metastatic adenopathy. 4. No evidence of visceral metastasis. 5. No skeletal metastasis.     08/15/2020 Imaging   Vascular US RIGHT:  - No evidence of common femoral vein obstruction.     LEFT:  - There is no evidence of deep vein thrombosis in the lower extremity.     - No cystic structure found in the popliteal fossa.  - Ultrasound characteristics of several enlarged lymph nodes, and a large anechoic area, are noted in the groin.    11/03/2020 PET scan   1. No significant PET-CT findings for regression of tumor or adenopathy. 2. No new chest, abdominal or osseous metastatic disease.     11/08/2020 Pathology Results   A. VULVA, LEFT POSTERIOR, BIOPSY:  - Invasive keratinizing squamous cell carcinoma.   11/13/2020 Cancer Staging   Staging form: Vulva, AJCC 8th Edition - Clinical stage from 11/13/2020: Stage IVA (cT2, cN3, cM0) - Signed by Heath Lark, MD on 11/13/2020 Stage prefix: Initial diagnosis   11/20/2020 Procedure   Placement of a subcutaneous power-injectable port device. Catheter tip in the lower SVC   11/22/2020 -  Chemotherapy   Patient is on Treatment Plan : Vulvar cancer Cisplatin + Paclitaxel q21d plus bevacizumab       PHYSICAL EXAMINATION: ECOG PERFORMANCE STATUS: 1 - Symptomatic but completely ambulatory  Vitals:   11/20/20 1141  BP: (!) 169/99  Pulse: 87  Resp: 18  Temp: (!) 96.8 F (36 C)  SpO2: 97%   Filed Weights   11/20/20 1141  Weight: 125 lb 4 oz (56.8 kg)    GENERAL:alert, no distress and comfortable NEURO: alert & oriented x 3 with fluent speech, no focal motor/sensory deficits  LABORATORY DATA:  I have reviewed the data as listed    Component Value Date/Time   NA 141  11/20/2020 1123   NA 142 02/18/2015 0000   K 4.4 11/20/2020 1123   CL 105 11/20/2020 1123   CO2 27 11/20/2020 1123   GLUCOSE 105 (H) 11/20/2020 1123   BUN 11 11/20/2020 1123   BUN 13 02/18/2015 0000   CREATININE 0.68 11/20/2020 1123   CALCIUM 9.6 11/20/2020 1123   PROT 7.3 11/20/2020 1123   ALBUMIN 3.5 11/20/2020 1123   AST 15 11/20/2020 1123   ALT 16 11/20/2020 1123   ALKPHOS 62 11/20/2020 1123   BILITOT 0.3 11/20/2020 1123   GFRNONAA >60 11/20/2020 1123    No results found for: SPEP, UPEP  Lab Results  Component Value Date   WBC 11.6 (H) 11/20/2020   NEUTROABS 9.8 (H) 11/20/2020   HGB 11.4 (L) 11/20/2020   HCT 35.6 (L) 11/20/2020  MCV 94.4 11/20/2020   PLT 380 11/20/2020      Chemistry      Component Value Date/Time   NA 141 11/20/2020 1123   NA 142 02/18/2015 0000   K 4.4 11/20/2020 1123   CL 105 11/20/2020 1123   CO2 27 11/20/2020 1123   BUN 11 11/20/2020 1123   BUN 13 02/18/2015 0000   CREATININE 0.68 11/20/2020 1123   GLU 95 02/18/2015 0000      Component Value Date/Time   CALCIUM 9.6 11/20/2020 1123   ALKPHOS 62 11/20/2020 1123   AST 15 11/20/2020 1123   ALT 16 11/20/2020 1123   BILITOT 0.3 11/20/2020 1123

## 2020-11-21 ENCOUNTER — Telehealth: Payer: Self-pay | Admitting: Oncology

## 2020-11-21 ENCOUNTER — Ambulatory Visit: Payer: 59

## 2020-11-21 MED FILL — Fosaprepitant Dimeglumine For IV Infusion 150 MG (Base Eq): INTRAVENOUS | Qty: 5 | Status: AC

## 2020-11-21 MED FILL — Dexamethasone Sodium Phosphate Inj 100 MG/10ML: INTRAMUSCULAR | Qty: 1 | Status: AC

## 2020-11-21 NOTE — Telephone Encounter (Signed)
[place] regarding her [employer].  Advised her she can use activated charcoal tablets, capsules or the powder she found at [facility].   She also reports having more constipation.  She is taking Miralax once in the mornings and dulcolax at night.  Advised her she can take Miralax twice a day (morning and night).  She verbalized understanding.

## 2020-11-22 ENCOUNTER — Inpatient Hospital Stay: Payer: 59

## 2020-11-22 ENCOUNTER — Other Ambulatory Visit: Payer: Self-pay

## 2020-11-22 VITALS — BP 135/76 | HR 77 | Temp 98.5°F | Resp 16

## 2020-11-22 DIAGNOSIS — C519 Malignant neoplasm of vulva, unspecified: Secondary | ICD-10-CM

## 2020-11-22 DIAGNOSIS — Z5111 Encounter for antineoplastic chemotherapy: Secondary | ICD-10-CM | POA: Diagnosis not present

## 2020-11-22 LAB — TOTAL PROTEIN, URINE DIPSTICK: Protein, ur: <30 mg/dL — AB

## 2020-11-22 MED ORDER — FAMOTIDINE 20 MG IN NS 100 ML IVPB
20.0000 mg | Freq: Once | INTRAVENOUS | Status: AC
  Administered 2020-11-22: 20 mg via INTRAVENOUS
  Filled 2020-11-22: qty 100

## 2020-11-22 MED ORDER — MAGNESIUM SULFATE 2 GM/50ML IV SOLN
2.0000 g | Freq: Once | INTRAVENOUS | Status: AC
  Administered 2020-11-22: 2 g via INTRAVENOUS
  Filled 2020-11-22: qty 50

## 2020-11-22 MED ORDER — DIPHENHYDRAMINE HCL 50 MG/ML IJ SOLN
25.0000 mg | Freq: Once | INTRAMUSCULAR | Status: AC
  Administered 2020-11-22: 25 mg via INTRAVENOUS
  Filled 2020-11-22: qty 1

## 2020-11-22 MED ORDER — SODIUM CHLORIDE 0.9 % IV SOLN
10.0000 mg | Freq: Once | INTRAVENOUS | Status: AC
  Administered 2020-11-22: 10 mg via INTRAVENOUS
  Filled 2020-11-22: qty 10

## 2020-11-22 MED ORDER — HEPARIN SOD (PORK) LOCK FLUSH 100 UNIT/ML IV SOLN
500.0000 [IU] | Freq: Once | INTRAVENOUS | Status: AC | PRN
  Administered 2020-11-22: 500 [IU]

## 2020-11-22 MED ORDER — SODIUM CHLORIDE 0.9 % IV SOLN
150.0000 mg | Freq: Once | INTRAVENOUS | Status: AC
  Administered 2020-11-22: 150 mg via INTRAVENOUS
  Filled 2020-11-22: qty 150

## 2020-11-22 MED ORDER — SODIUM CHLORIDE 0.9 % IV SOLN
175.0000 mg/m2 | Freq: Once | INTRAVENOUS | Status: AC
  Administered 2020-11-22: 276 mg via INTRAVENOUS
  Filled 2020-11-22: qty 46

## 2020-11-22 MED ORDER — SODIUM CHLORIDE 0.9 % IV SOLN: Freq: Once | INTRAVENOUS | Status: AC

## 2020-11-22 MED ORDER — SODIUM CHLORIDE 0.9 % IV SOLN
40.0000 mg/m2 | Freq: Once | INTRAVENOUS | Status: AC
  Administered 2020-11-22: 63 mg via INTRAVENOUS
  Filled 2020-11-22: qty 63

## 2020-11-22 MED ORDER — SODIUM CHLORIDE 0.9% FLUSH
10.0000 mL | INTRAVENOUS | Status: DC | PRN
  Administered 2020-11-22: 10 mL

## 2020-11-22 MED ORDER — PALONOSETRON HCL INJECTION 0.25 MG/5ML
0.2500 mg | Freq: Once | INTRAVENOUS | Status: AC
  Administered 2020-11-22: 0.25 mg via INTRAVENOUS
  Filled 2020-11-22: qty 5

## 2020-11-22 MED ORDER — POTASSIUM CHLORIDE IN NACL 20-0.9 MEQ/L-% IV SOLN
Freq: Once | INTRAVENOUS | Status: AC
  Filled 2020-11-22: qty 1000

## 2020-11-22 NOTE — Progress Notes (Signed)
Per Dr. Lindi Adie we can administer post-hydration fluids at the same time as the Cisplatin today.

## 2020-11-22 NOTE — Patient Instructions (Signed)
[facility]  Discharge Instructions: Thank you for choosing [facility] to provide your oncology and hematology care.   If you have a lab appointment with the [facility], please go directly to the [facility] and check in at the registration area.   Wear comfortable clothing and clothing appropriate for easy access to any Portacath or PICC line.   We strive to give you quality time with your provider. You may need to reschedule your appointment if you arrive late (15 or more minutes).  Arriving late affects you and other patients whose appointments are after yours.  Also, if you miss three or more appointments without notifying the office, you may be dismissed from the clinic at the provider's discretion.      For prescription refill requests, have your pharmacy contact our office and allow 72 hours for refills to be completed.    Today you received the following chemotherapy and/or immunotherapy agents: Taxol & Cisplatin   To help prevent nausea and vomiting after your treatment, we encourage you to take your nausea medication as directed.  BELOW ARE SYMPTOMS THAT SHOULD BE REPORTED IMMEDIATELY: *FEVER GREATER THAN 100.4 F (38 C) OR HIGHER *CHILLS OR SWEATING *NAUSEA AND VOMITING THAT IS NOT CONTROLLED WITH YOUR NAUSEA MEDICATION *UNUSUAL SHORTNESS OF BREATH *UNUSUAL BRUISING OR BLEEDING *URINARY PROBLEMS (pain or burning when urinating, or frequent urination) *BOWEL PROBLEMS (unusual diarrhea, constipation, pain near the anus) TENDERNESS IN MOUTH AND THROAT WITH OR WITHOUT PRESENCE OF ULCERS (sore throat, sores in mouth, or a toothache) UNUSUAL RASH, SWELLING OR PAIN  UNUSUAL VAGINAL DISCHARGE OR ITCHING   Items with * indicate a potential emergency and should be followed up as soon as possible or go to the Emergency Department if any problems should occur.  Please show the CHEMOTHERAPY ALERT CARD or IMMUNOTHERAPY ALERT CARD at  check-in to the Emergency Department and triage nurse.  Should you have questions after your visit or need to cancel or reschedule your appointment, please contact [facility]  Dept: [phone]  and follow the prompts.  Office hours are 8:00 a.m. to 4:30 p.m. Monday - Friday. Please note that voicemails left after 4:00 p.m. may not be returned until the following business day.  We are closed weekends and major holidays. You have access to a nurse at all times for urgent questions. Please call the main number to the clinic Dept: [phone] and follow the prompts.   For any non-urgent questions, you may also contact your provider using MyChart. We now offer e-Visits for anyone 30 and older to request care online for non-urgent symptoms. For details visit [email or web address].   Also download the MyChart app! Go to the app store, search "MyChart", open the app, select [facility], and log in with your MyChart username and password.  Due to Covid, a mask is required upon entering the hospital/clinic. If you do not have a mask, one will be given to you upon arrival. For doctor visits, patients may have 1 support person aged 27 or older with them. For treatment visits, patients cannot have anyone with them due to current Covid guidelines and our immunocompromised population.   Paclitaxel injection What is this medication? PACLITAXEL (PAK li TAX el) is a chemotherapy drug. It targets fast dividing cells, like cancer cells, and causes these cells to die. This medicine is used to treat ovarian cancer, breast cancer, lung cancer, Kaposi's sarcoma, and other cancers. This medicine may be  used for other purposes; ask your health care provider or pharmacist if you have questions. COMMON BRAND NAME(S): Onxol, Taxol What should I tell my care team before I take this medication? They need to know if you have any of these conditions: history of irregular heartbeat liver  disease low blood counts, like low white cell, platelet, or red cell counts lung or breathing disease, like asthma tingling of the fingers or toes, or other nerve disorder an unusual or allergic reaction to paclitaxel, alcohol, polyoxyethylated castor oil, other chemotherapy, other medicines, foods, dyes, or preservatives pregnant or trying to get pregnant breast-feeding How should I use this medication? This drug is given as an infusion into a vein. It is administered in a hospital or clinic by a specially trained health care professional. Talk to your pediatrician regarding the use of this medicine in children. Special care may be needed. Overdosage: If you think you have taken too much of this medicine contact a poison control center or emergency room at once. NOTE: This medicine is only for you. Do not share this medicine with others. What if I miss a dose? It is important not to miss your dose. Call your doctor or health care professional if you are unable to keep an appointment. What may interact with this medication? Do not take this medicine with any of the following medications: live virus vaccines This medicine may also interact with the following medications: antiviral medicines for hepatitis, HIV or AIDS certain antibiotics like erythromycin and clarithromycin certain medicines for fungal infections like ketoconazole and itraconazole certain medicines for seizures like carbamazepine, phenobarbital, phenytoin gemfibrozil nefazodone rifampin St. John's wort This list may not describe all possible interactions. Give your health care provider a list of all the medicines, herbs, non-prescription drugs, or dietary supplements you use. Also tell them if you smoke, drink alcohol, or use illegal drugs. Some items may interact with your medicine. What should I watch for while using this medication? Your condition will be monitored carefully while you are receiving this medicine. You  will need important blood work done while you are taking this medicine. This medicine can cause serious allergic reactions. To reduce your risk you will need to take other medicine(s) before treatment with this medicine. If you experience allergic reactions like skin rash, itching or hives, swelling of the face, lips, or tongue, tell your doctor or health care professional right away. In some cases, you may be given additional medicines to help with side effects. Follow all directions for their use. This drug may make you feel generally unwell. This is not uncommon, as chemotherapy can affect healthy cells as well as cancer cells. Report any side effects. Continue your course of treatment even though you feel ill unless your doctor tells you to stop. Call your doctor or health care professional for advice if you get a fever, chills or sore throat, or other symptoms of a cold or flu. Do not treat yourself. This drug decreases your body's ability to fight infections. Try to avoid being around people who are sick. This medicine may increase your risk to bruise or bleed. Call your doctor or health care professional if you notice any unusual bleeding. Be careful brushing and flossing your teeth or using a toothpick because you may get an infection or bleed more easily. If you have any dental work done, tell your dentist you are receiving this medicine. Avoid taking products that contain aspirin, acetaminophen, ibuprofen, naproxen, or ketoprofen unless instructed by your doctor.  These medicines may hide a fever. Do not become pregnant while taking this medicine. Women should inform their doctor if they wish to become pregnant or think they might be pregnant. There is a potential for serious side effects to an unborn child. Talk to your health care professional or pharmacist for more information. Do not breast-feed an infant while taking this medicine. Men are advised not to father a child while receiving this  medicine. This product may contain alcohol. Ask your pharmacist or healthcare provider if this medicine contains alcohol. Be sure to tell all healthcare providers you are taking this medicine. Certain medicines, like metronidazole and disulfiram, can cause an unpleasant reaction when taken with alcohol. The reaction includes flushing, headache, nausea, vomiting, sweating, and increased thirst. The reaction can last from 30 minutes to several hours. What side effects may I notice from receiving this medication? Side effects that you should report to your doctor or health care professional as soon as possible: allergic reactions like skin rash, itching or hives, swelling of the face, lips, or tongue breathing problems changes in vision fast, irregular heartbeat high or low blood pressure mouth sores pain, tingling, numbness in the hands or feet signs of decreased platelets or bleeding - bruising, pinpoint red spots on the skin, black, tarry stools, blood in the urine signs of decreased red blood cells - unusually weak or tired, feeling faint or lightheaded, falls signs of infection - fever or chills, cough, sore throat, pain or difficulty passing urine signs and symptoms of liver injury like dark yellow or brown urine; general ill feeling or flu-like symptoms; light-colored stools; loss of appetite; nausea; right upper belly pain; unusually weak or tired; yellowing of the eyes or skin swelling of the ankles, feet, hands unusually slow heartbeat Side effects that usually do not require medical attention (report to your doctor or health care professional if they continue or are bothersome): diarrhea hair loss loss of appetite muscle or joint pain nausea, vomiting pain, redness, or irritation at site where injected tiredness This list may not describe all possible side effects. Call your doctor for medical advice about side effects. You may report side effects to FDA at 1-800-FDA-1088. Where  should I keep my medication? This drug is given in a hospital or clinic and will not be stored at home. NOTE: This sheet is a summary. It may not cover all possible information. If you have questions about this medicine, talk to your doctor, pharmacist, or health care provider.  2022 Elsevier/Gold Standard (2019-01-06 13:37:23)  Cisplatin injection What is this medication? CISPLATIN (SIS pla tin) is a chemotherapy drug. It targets fast dividing cells, like cancer cells, and causes these cells to die. This medicine is used to treat many types of cancer like bladder, ovarian, and testicular cancers. This medicine may be used for other purposes; ask your health care provider or pharmacist if you have questions. COMMON BRAND NAME(S): Platinol, Platinol -AQ What should I tell my care team before I take this medication? They need to know if you have any of these conditions: eye disease, vision problems hearing problems kidney disease low blood counts, like white cells, platelets, or red blood cells tingling of the fingers or toes, or other nerve disorder an unusual or allergic reaction to cisplatin, carboplatin, oxaliplatin, other medicines, foods, dyes, or preservatives pregnant or trying to get pregnant breast-feeding How should I use this medication? This drug is given as an infusion into a vein. It is administered in a hospital or  clinic by a specially trained health care professional. Talk to your pediatrician regarding the use of this medicine in children. Special care may be needed. Overdosage: If you think you have taken too much of this medicine contact a poison control center or emergency room at once. NOTE: This medicine is only for you. Do not share this medicine with others. What if I miss a dose? It is important not to miss a dose. Call your doctor or health care professional if you are unable to keep an appointment. What may interact with this medication? This medicine may  interact with the following medications: foscarnet certain antibiotics like amikacin, gentamicin, neomycin, polymyxin B, streptomycin, tobramycin, vancomycin This list may not describe all possible interactions. Give your health care provider a list of all the medicines, herbs, non-prescription drugs, or dietary supplements you use. Also tell them if you smoke, drink alcohol, or use illegal drugs. Some items may interact with your medicine. What should I watch for while using this medication? Your condition will be monitored carefully while you are receiving this medicine. You will need important blood work done while you are taking this medicine. This drug may make you feel generally unwell. This is not uncommon, as chemotherapy can affect healthy cells as well as cancer cells. Report any side effects. Continue your course of treatment even though you feel ill unless your doctor tells you to stop. This medicine may increase your risk of getting an infection. Call your healthcare professional for advice if you get a fever, chills, or sore throat, or other symptoms of a cold or flu. Do not treat yourself. Try to avoid being around people who are sick. Avoid taking medicines that contain aspirin, acetaminophen, ibuprofen, naproxen, or ketoprofen unless instructed by your healthcare professional. These medicines may hide a fever. This medicine may increase your risk to bruise or bleed. Call your doctor or health care professional if you notice any unusual bleeding. Be careful brushing and flossing your teeth or using a toothpick because you may get an infection or bleed more easily. If you have any dental work done, tell your dentist you are receiving this medicine. Do not become pregnant while taking this medicine or for 14 months after stopping it. Women should inform their healthcare professional if they wish to become pregnant or think they might be pregnant. Men should not father a child while taking  this medicine and for 11 months after stopping it. There is potential for serious side effects to an unborn child. Talk to your healthcare professional for more information. Do not breast-feed an infant while taking this medicine. This medicine has caused ovarian failure in some women. This medicine may make it more difficult to get pregnant. Talk to your healthcare professional if you are concerned about your fertility. This medicine has caused decreased sperm counts in some men. This may make it more difficult to father a child. Talk to your healthcare professional if you are concerned about your fertility. Drink fluids as directed while you are taking this medicine. This will help protect your kidneys. Call your doctor or health care professional if you get diarrhea. Do not treat yourself. What side effects may I notice from receiving this medication? Side effects that you should report to your doctor or health care professional as soon as possible: allergic reactions like skin rash, itching or hives, swelling of the face, lips, or tongue blurred vision changes in vision decreased hearing or ringing of the ears nausea, vomiting pain, redness,  or irritation at site where injected pain, tingling, numbness in the hands or feet signs and symptoms of bleeding such as bloody or black, tarry stools; red or dark brown urine; spitting up blood or brown material that looks like coffee grounds; red spots on the skin; unusual bruising or bleeding from the eyes, gums, or nose signs and symptoms of infection like fever; chills; cough; sore throat; pain or trouble passing urine signs and symptoms of kidney injury like trouble passing urine or change in the amount of urine signs and symptoms of low red blood cells or anemia such as unusually weak or tired; feeling faint or lightheaded; falls; breathing problems Side effects that usually do not require medical attention (report to your doctor or health care  professional if they continue or are bothersome): loss of appetite mouth sores muscle cramps This list may not describe all possible side effects. Call your doctor for medical advice about side effects. You may report side effects to FDA at 1-800-FDA-1088. Where should I keep my medication? This drug is given in a hospital or clinic and will not be stored at home. NOTE: This sheet is a summary. It may not cover all possible information. If you have questions about this medicine, talk to your doctor, pharmacist, or health care provider.  2022 Elsevier/Gold Standard (2018-01-30 15:59:17)

## 2020-11-23 ENCOUNTER — Other Ambulatory Visit: Payer: Self-pay | Admitting: Hematology and Oncology

## 2020-11-24 ENCOUNTER — Telehealth (HOSPITAL_BASED_OUTPATIENT_CLINIC_OR_DEPARTMENT_OTHER): Payer: 59 | Admitting: Hematology and Oncology

## 2020-11-24 ENCOUNTER — Other Ambulatory Visit (HOSPITAL_COMMUNITY): Payer: Self-pay

## 2020-11-24 ENCOUNTER — Other Ambulatory Visit: Payer: Self-pay

## 2020-11-24 ENCOUNTER — Encounter: Payer: Self-pay | Admitting: Hematology and Oncology

## 2020-11-24 ENCOUNTER — Telehealth: Payer: Self-pay | Admitting: Oncology

## 2020-11-24 DIAGNOSIS — C519 Malignant neoplasm of vulva, unspecified: Secondary | ICD-10-CM

## 2020-11-24 DIAGNOSIS — K5909 Other constipation: Secondary | ICD-10-CM

## 2020-11-24 DIAGNOSIS — G893 Neoplasm related pain (acute) (chronic): Secondary | ICD-10-CM | POA: Diagnosis not present

## 2020-11-24 DIAGNOSIS — R102 Pelvic and perineal pain: Secondary | ICD-10-CM | POA: Diagnosis not present

## 2020-11-24 MED ORDER — MORPHINE SULFATE ER 15 MG PO TBCR: EXTENDED_RELEASE_TABLET | ORAL | 0 refills | Status: DC

## 2020-11-24 MED ORDER — LACTULOSE 10 GM/15ML PO SOLN
20.0000 g | Freq: Three times a day (TID) | ORAL | 3 refills | Status: AC
  Filled 2020-11-24: qty 473, 5d supply, fill #0

## 2020-11-24 MED ORDER — MORPHINE SULFATE ER 30 MG PO TBCR
30.0000 mg | EXTENDED_RELEASE_TABLET | Freq: Two times a day (BID) | ORAL | 0 refills | Status: DC
  Filled 2020-11-24: qty 30, 15d supply, fill #0

## 2020-11-24 NOTE — Assessment & Plan Note (Signed)
Overall, she tolerated treatment well except for constipation and persistent severe pain I have a long discussion with the patient about aggressive pain management and additional laxatives for constipation I will see her next week for further follow-up

## 2020-11-24 NOTE — Progress Notes (Signed)
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[place]) Overall, she tolerated treatment well except for constipation and persistent severe pain I have a long discussion with the patient about aggressive pain management and additional laxatives for constipation I will see her next week for further follow-up  Cancer associated pain On average, she is taking breakthrough oxycodone 4 times a day along with morphine sulfate 30 mg twice daily for adequate pain control I refilled her prescription morphine sulfate 30 mg twice daily She will continue to monitor her breakthrough pain medicine intake  Other constipation She has severe constipation likely due to side effects of medications I recommend she increase MiraLAX to twice daily, keep Dulcolax at nighttime, add Senokot 2 pills twice daily today Tomorrow, if she has no bowel movement, she will add lactulose on top of the prescribed regimen above   No orders of the defined types were placed in this encounter.   INTERVAL HISTORY: Please see below for problem oriented charting. The purpose of today's visit is to address her cancer associated pain and constipation She has no bowel movement for 2 days She is taking daily MiraLAX and Dulcolax at nighttime Denies nausea Her pain is rated at 4 out of 10 Last night, she woke up in the middle of the night with significant pain but did not take any breakthrough pain medicine She subsequently went back to bed and slept until late this  morning She continues to have frequent small meals She had difficulties with urination but stable overall  SUMMARY OF ONCOLOGIC HISTORY: Oncology History Overview Note  PD-L1 CPS score is 15%   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 VULVA, BIOPSY:  -  Squamous cell carcinoma   06/16/2020 Initial Diagnosis   The patient began experiencing vulvar burning after her knee replacement in March 2022.  She mentioned this to her primary care physician who looked at it and was unclear about the diagnosis but recommended consultation with a gynecologist.  The patient did not have an established gynecologist and therefore made a new patient consultation with Dr. Dellis Filbert.  Dr. Dellis Filbert saw the patient on 06/16/2020 which revealed a squamous cell carcinoma of the vulva.  The tumor was well differentiated and keratinizing.  Her primary vulvar cancer was not felt to be amenable to primary resection as it was a 6cm lesion replacing the perineal body and adherent with the anal sphincter.     06/30/2020 Imaging   Ct chest, abdomen and pelvis  1. No evidence of distant metastatic disease in the chest, abdomen or pelvis. 2. The patient's known cervical cancer is not well visualized. No gross bladder or anorectal involvement. No pelvic adenopathy. 3. Tiny right middle lobe nodule, likely benign. Attention on follow-up. 4.  Aortic Atherosclerosis (ICD10-I70.0).   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   Surgeons:  Donaciano Eva, MD   Pathology: right and left inguinofemoral lymph nodes.   Operative findings: clinically suspicious medial left inguinal lymph node. 6cm perineal  vulvar tumor.     07/11/2020 Pathology Results   A. LYMPH NODE, RIGHT INGUINAL FEMORAL, RESECTION:  - Metastatic keratinizing squamous cell carcinoma involving one of three lymph nodes (1/3)   - No evidence of extranodal extension   B. LYMPH NODE, LEFT INGUINAL FEMORAL, RESECTION:  - Metastatic keratinizing squamous cell carcinoma involving two of four lymph nodes (2/4)  - No evidence of extranodal extension    08/03/2020 - 10/02/2020 Radiation Therapy   Radiation Treatment Dates: 08/03/2020 through 10/02/2020 Site Technique Total Dose (Gy) Dose per Fx (Gy) Completed Fx Beam Energies  Vulva: Pelvis IMRT 50.4/50.4 1.8 28/28 6X  Vulva: Pelvis_Bst IMRT 12.6/12.6 1.8 7/7 6X  Vulva: Pelvis_Bst_2 IMRT 10/10 2 5/5 6X      Cumulative dose to the vulvar region was 63.0 Gy.  Cumulative dose to the pelvis 50.4 Gy.  The inguinal areas were boosted to a cumulative dose of 60.4 Gy.     08/08/2020 PET scan   1. Intense activity midline along the perineum/external genitalia with differential of FDG urine contamination versus malignancy of the external genitalia. 2. Bilateral mildly enlarged intensely hypermetabolic inguinal lymph nodes. Biopsy clips adjacent to these lymph nodes. Findings concerning for metastatic inguinal adenopathy(large benign-appearing fluid collection in the LEFT groin is presumably a post surgical seroma). 3. No evidence of metastatic pelvic iliac lymphadenopathy or retroperitoneal periaortic metastatic adenopathy. 4. No evidence of visceral metastasis. 5. No skeletal metastasis.     08/15/2020 Imaging   Vascular US RIGHT:  - No evidence of common femoral vein obstruction.     LEFT:  - There is no evidence of deep vein thrombosis in the lower extremity.     - No cystic structure found in the popliteal fossa.  - Ultrasound characteristics of several enlarged lymph nodes, and a large anechoic area, are noted in the groin.    11/03/2020 PET scan   1. No significant PET-CT findings for regression of tumor or adenopathy. 2. No new chest, abdominal or osseous metastatic disease.     11/08/2020 Pathology Results   A. VULVA, LEFT POSTERIOR, BIOPSY:  - Invasive  keratinizing squamous cell carcinoma.   11/13/2020 Cancer Staging   Staging form: Vulva, AJCC 8th Edition - Clinical stage from 11/13/2020: Stage IVA (cT2, cN3, cM0) - Signed by Heath Lark, MD on 11/13/2020 Stage prefix: Initial diagnosis   11/20/2020 Procedure   Placement of a subcutaneous power-injectable port device. Catheter tip in the lower SVC   11/22/2020 -  Chemotherapy   Patient is on Treatment Plan : Vulvar cancer Cisplatin + Paclitaxel q21d plus bevacizumab       REVIEW OF SYSTEMS:   Constitutional: Denies fevers, chills or abnormal weight loss Eyes: Denies blurriness of vision Ears, nose, mouth, throat, and face: Denies mucositis or sore throat Respiratory: Denies cough, dyspnea or wheezes Cardiovascular: Denies palpitation, chest discomfort Skin: Denies abnormal skin rashes Lymphatics: Denies new lymphadenopathy or easy bruising Neurological:Denies numbness, tingling or new weaknesses Behavioral/Psych: Mood is stable, no new changes  Extremities: No lower extremity edema All other systems were reviewed with the patient and are negative.  I have reviewed the past medical history, past surgical history, social history and family history with the patient and they are unchanged from previous note.  ALLERGIES:  has No Known Allergies.  MEDICATIONS:  Current Outpatient Medications  Medication Sig Dispense Refill   lactulose (CHRONULAC) 10 GM/15ML solution Take 30 mLs (20 g total) by mouth 3 (three) times daily. 473 mL 3   morphine (MS  CONTIN) 30 MG 12 hr tablet Take 1 tablet (30 mg total) by mouth every 12 (twelve) hours. 30 tablet 0   bisacodyl (DULCOLAX) 5 MG EC tablet Take 5 mg by mouth daily.     cetirizine (ZYRTEC) 10 MG tablet TAKE 1 TO 2 TABLETS BY MOUTH DAILY AS NEEDED (Patient taking differently: Take 10 mg by mouth daily.) 60 tablet 5   Cholecalciferol (VITAMIN D) 50 MCG (2000 UT) tablet Take 2,000 Units by mouth daily.     Emollient (AQUAPHOR EX) Apply 1  application topically daily.     gabapentin (NEURONTIN) 300 MG capsule Take 300 mg by mouth at bedtime.     ibuprofen (ADVIL) 600 MG tablet Take 1 tablet (600 mg total) by mouth every 6 (six) hours as needed for moderate pain. 30 tablet 0   lidocaine-prilocaine (EMLA) cream Apply to affected area once 30 g 3   metroNIDAZOLE (FLAGYL) 250 MG tablet Take 1 tablet (250 mg total) by mouth 2 (two) times daily. 20 tablet 0   ondansetron (ZOFRAN ODT) 4 MG disintegrating tablet 45m ODT q4 hours prn nausea/vomit 15 tablet 1   ondansetron (ZOFRAN) 8 MG tablet Take 1 tablet (8 mg total) by mouth every 8 (eight) hours as needed. Start on the third day after cisplatin chemotherapy. 30 tablet 1   oxybutynin (DITROPAN XL) 15 MG 24 hr tablet Take 15 mg by mouth daily.     oxyCODONE (ROXICODONE) 15 MG immediate release tablet Take 1 tablet (15 mg total) by mouth every 3 (three) hours as needed for severe pain. 60 tablet 0   polyethylene glycol (MIRALAX / GLYCOLAX) 17 g packet Take 17 g by mouth daily.     Probiotic Product (PROBIOTIC PO) Take 1 capsule by mouth daily.     prochlorperazine (COMPAZINE) 10 MG tablet Take 1 tablet (10 mg total) by mouth every 6 (six) hours as needed (Nausea or vomiting). 30 tablet 1   No current facility-administered medications for this visit.    PHYSICAL EXAMINATION: ECOG PERFORMANCE STATUS: 1 - Symptomatic but completely ambulatory  LABORATORY DATA:  I have reviewed the data as listed CMP Latest Ref Rng & Units 11/20/2020 09/12/2020 07/07/2020  Glucose 70 - 99 mg/dL 105(H) 113(H) 98  BUN 8 - 23 mg/dL _0 Creatinine 0.44 - 1.00 mg/dL 0.68 0.63 0.71  Sodium 135 - 145 mmol/L 141 138 138  Potassium 3.5 - 5.1 mmol/L 4.4 3.7 3.7  Chloride 98 - 111 mmol/L 105 103 104  CO2 22 - 32 mmol/L _1 Calcium 8.9 - 10.3 mg/dL 9.6 9.1 9.6  Total Protein 6.5 - 8.1 g/dL 7.3 6.6 7.3  Total Bilirubin 0.3 - 1.2 mg/dL 0.3 1.3(H) 0.6  Alkaline Phos 38 - 126 U/L 62 43 49  AST 15 - 41  U/L 15 13(L) 20  ALT 0 - 44 U/L _2 Lab Results  Component Value Date   WBC 11.6 (H) 11/20/2020   HGB 11.4 (L) 11/20/2020   HCT 35.6 (L) 11/20/2020   MCV 94.4 11/20/2020   PLT 380 11/20/2020   NEUTROABS 9.8 (H) 11/20/2020     RADIOGRAPHIC STUDIES: I have personally reviewed the radiological images as listed and agreed with the findings in the report. NM PET Image Restage (PS) Skull Base to Thigh (F-18 FDG)  Result Date: 11/04/2020 CLINICAL DATA:  Subsequent treatment strategy for vulvar carcinoma. EXAM: NUCLEAR MEDICINE PET SKULL BASE TO THIGH TECHNIQUE: 6.28 mCi F-18 FDG was injected  intravenously. Full-ring PET imaging was performed from the skull base to thigh after the radiotracer. CT data was obtained and used for attenuation correction and anatomic localization. Fasting blood glucose: 117 mg/dl COMPARISON:  PET-CT 08/08/2020 FINDINGS: Mediastinal blood pool activity: SUV max 1.76 Liver activity: SUV max NA NECK: No hypermetabolic lymph nodes in the neck. Incidental CT findings: none CHEST: No hypermetabolic mediastinal or hilar nodes. No suspicious pulmonary nodules on the CT scan. No breast masses, supraclavicular or axillary adenopathy. Incidental CT findings: Stable atherosclerotic calcifications involving the aorta and coronary arteries. ABDOMEN/PELVIS: No hepatic or adrenal gland lesions. No abdominal lymphadenopathy. The vulvar lesions is slightly less apparent on the CT scan but difficult to measure. SUV max is 17.76 and was previously 18.95. The large left inguinal fluid collection is markedly smaller. 15 mm left inguinal lymph node on image 160/4 has an SUV max of 10.85. Previously measured 16.5 mm and had an SUV max of 11.35 The largest right inguinal node measures 13 mm on image 168/4. This previously measured 12 mm. SUV max is 12.65 and was previously 8.67. Two other smaller right inguinal nodes are hypermetabolic. Pelvic sidewall lymph nodes are stable in size.  Right-sided node on image 155/4 has an SUV max of 5.71. This was previously 3.36. Foci of hypermetabolism near iliac arteries without definite CT correlate. This is likely due to the ureters. Incidental CT findings: Stable vascular calcifications. Status post cholecystectomy. SKELETON: No significant bony findings. Incidental CT findings: none IMPRESSION: 1. No significant PET-CT findings for regression of tumor or adenopathy. 2. No new chest, abdominal or osseous metastatic disease. Electronically Signed   By: Marijo Sanes M.D.   On: 11/04/2020 18:14   IR IMAGING GUIDED PORT INSERTION  Result Date: 11/15/2020 INDICATION: 60 year old with squamous cell carcinoma of the vulva. Port-A-Cath needed for chemotherapy. EXAM: FLUOROSCOPIC AND ULTRASOUND GUIDED PLACEMENT OF A SUBCUTANEOUS PORT COMPARISON:  None. MEDICATIONS: Moderate sedation ANESTHESIA/SEDATION: Versed 3.0 mg IV; Fentanyl 125 mcg IV; Moderate Sedation Time:  23 minutes The patient was continuously monitored during the procedure by the interventional radiology nurse under my direct supervision. FLUOROSCOPY TIME:  36 seconds, 1 mGy COMPLICATIONS: None immediate. PROCEDURE: The procedure, risks, benefits, and alternatives were explained to the patient. Questions regarding the procedure were encouraged and answered. The patient understands and consents to the procedure. Patient was placed supine on the interventional table. Ultrasound confirmed a patent right internal jugular vein. Ultrasound image was saved for documentation. The right chest and neck were cleaned with a skin antiseptic and a sterile drape was placed. Maximal barrier sterile technique was utilized including caps, mask, sterile gowns, sterile gloves, sterile drape, hand hygiene and skin antiseptic. The right neck was anesthetized with 1% lidocaine. Small incision was made in the right neck with a blade. Micropuncture set was placed in the right internal jugular vein with ultrasound  guidance. The micropuncture wire was used for measurement purposes. The right chest was anesthetized with 1% lidocaine with epinephrine. #15 blade was used to make an incision and a subcutaneous port pocket was formed. [place] was assembled. Subcutaneous tunnel was formed with a stiff tunneling device. The port catheter was brought through the subcutaneous tunnel. The port was placed in the subcutaneous pocket and sutured in place. The micropuncture set was exchanged for a peel-away sheath. The catheter was placed through the peel-away sheath and the tip was positioned in the lower SVC. Catheter placement was confirmed with fluoroscopy. The port was accessed and flushed with heparinized  saline. The port pocket was closed using two layers of absorbable sutures and Dermabond. The vein skin site was closed using a single layer of absorbable suture and Dermabond. Sterile dressings were applied. Patient tolerated the procedure well without an immediate complication. Ultrasound and fluoroscopic images were taken and saved for this procedure. IMPRESSION: Placement of a subcutaneous power-injectable port device. Catheter tip in the lower SVC. Electronically Signed   By: Markus Daft M.D.   On: 11/15/2020 17:39    I discussed the assessment and treatment plan with the patient. The patient was provided an opportunity to ask questions and all were answered. The patient agreed with the plan and demonstrated an understanding of the instructions. The patient was advised to call back or seek an in-person evaluation if the symptoms worsen or if the condition fails to improve as anticipated.    I spent 20 minutes for the appointment reviewing test results, discuss management and coordination of care.  Heath Lark, MD 11/24/2020 1:13 PM

## 2020-11-24 NOTE — Telephone Encounter (Signed)
Left a message regarding pain medication.  Requested a return call.

## 2020-11-24 NOTE — Assessment & Plan Note (Signed)
On average, she is taking breakthrough oxycodone 4 times a day along with morphine sulfate 30 mg twice daily for adequate pain control I refilled her prescription morphine sulfate 30 mg twice daily She will continue to monitor her breakthrough pain medicine intake

## 2020-11-24 NOTE — Assessment & Plan Note (Signed)
She has severe constipation likely due to side effects of medications I recommend she increase MiraLAX to twice daily, keep Dulcolax at nighttime, add Senokot 2 pills twice daily today Tomorrow, if she has no bowel movement, she will add lactulose on top of the prescribed regimen above

## 2020-11-24 NOTE — Telephone Encounter (Signed)
Emilyann Ann's daughter, Sonia Baller, left a message that the only refill that they would need would be the extended release Morphine.  She has enough to last until Monday night but weren't sure if we would be open on Monday.    She also said Terrica Duecker has not had a bowel movement since Wednesday and was up an hour with pain during the night.  She is taking Miralax once a day and dulcolax at bedtime.  They wanted to let Dr. Alvy Bimler know and to see if she needs to do anything else.

## 2020-11-27 ENCOUNTER — Encounter: Payer: Self-pay | Admitting: Hematology and Oncology

## 2020-11-27 ENCOUNTER — Ambulatory Visit: Payer: 59

## 2020-11-28 ENCOUNTER — Telehealth: Payer: Self-pay | Admitting: Nurse Practitioner

## 2020-11-28 ENCOUNTER — Encounter: Payer: Self-pay | Admitting: Hematology and Oncology

## 2020-11-28 ENCOUNTER — Inpatient Hospital Stay (HOSPITAL_BASED_OUTPATIENT_CLINIC_OR_DEPARTMENT_OTHER): Payer: 59 | Admitting: Hematology and Oncology

## 2020-11-28 ENCOUNTER — Other Ambulatory Visit: Payer: Self-pay

## 2020-11-28 DIAGNOSIS — G893 Neoplasm related pain (acute) (chronic): Secondary | ICD-10-CM

## 2020-11-28 DIAGNOSIS — R634 Abnormal weight loss: Secondary | ICD-10-CM | POA: Diagnosis not present

## 2020-11-28 DIAGNOSIS — C519 Malignant neoplasm of vulva, unspecified: Secondary | ICD-10-CM | POA: Diagnosis not present

## 2020-11-28 DIAGNOSIS — Z5111 Encounter for antineoplastic chemotherapy: Secondary | ICD-10-CM | POA: Diagnosis not present

## 2020-11-28 DIAGNOSIS — K5909 Other constipation: Secondary | ICD-10-CM

## 2020-11-28 MED ORDER — GABAPENTIN 300 MG PO CAPS: 300.0000 mg | ORAL_CAPSULE | Freq: Every day | ORAL | 3 refills | Status: AC

## 2020-11-28 MED ORDER — MORPHINE SULFATE ER 30 MG PO TBCR: 30.0000 mg | EXTENDED_RELEASE_TABLET | Freq: Every day | ORAL | 0 refills | Status: DC

## 2020-11-28 NOTE — Telephone Encounter (Signed)
Appt has been scheduled and message was left on pts voicemail

## 2020-11-28 NOTE — Assessment & Plan Note (Signed)
She has unintentional weight loss due to poor oral intake We discussed importance of frequent small meals and high calorie intake

## 2020-11-28 NOTE — Progress Notes (Signed)
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[place]) Overall, she tolerated treatment well except it was complicated by slight delayed nausea as well as constipation and poor appetite She has lost some weight She is needing less pain medicine I recommend aggressive supportive care with changes in her pain medicine and frequent oral intake I will schedule virtual visit next week for further follow-up  Cancer associated pain She is needing less pain medicine I recommend she keeps MS Contin at nighttime only In the future, when she is due for refill, I plan to reduce the dose to 15 mg pill She also needs less breakthrough oxycodone She still have some lower strength 10 mg oxycodone to take as needed She can continue to take acetaminophen with oxycodone We will call her again on Friday in case she need prescription refill  Other constipation Her constipation has resolved She will continue daily MiraLAX along with Dulcolax at nighttime  Weight loss, non-intentional She has unintentional weight loss due to poor oral intake We discussed importance of frequent small meals and high calorie intake  No orders of the defined types were placed in this encounter.   All questions were answered. The patient knows to call the clinic with any problems, questions or concerns. The total time spent in the appointment was 30 minutes encounter with patients including review of chart and various tests results, discussions about plan of care and coordination of care plan   Heath Lark, MD 11/28/2020 4:49  PM  INTERVAL HISTORY: Please see below for problem oriented charting. she returns for treatment follow-up with her husband and son She had received cycle 1 of cisplatin and paclitaxel recently for recurrent/persistent vulvar cancer Since last time I saw her, the first few days after chemotherapy she has reduced appetite and severe constipation Since then, her appetite has improved She has lost some weight She needed less pain medicine over the past 7 days Her constipation has resolved She denies peripheral neuropathy from treatment  REVIEW OF SYSTEMS:   Constitutional: Denies fevers, chills  Eyes: Denies blurriness of vision Ears, nose, mouth, throat, and face: Denies mucositis or sore throat Respiratory: Denies cough, dyspnea or wheezes Cardiovascular: Denies palpitation, chest discomfort or lower extremity swelling Skin: Denies abnormal skin rashes Lymphatics: Denies new lymphadenopathy or easy bruising Neurological:Denies numbness, tingling or new weaknesses Behavioral/Psych: Mood is stable, no new changes  All other systems were reviewed with the patient and are negative.  I have reviewed the past medical history, past surgical history, social history and family history with the patient and they are unchanged from previous note.  ALLERGIES:  has No Known Allergies.  MEDICATIONS:  Current Outpatient Medications  Medication Sig Dispense Refill   bisacodyl (DULCOLAX) 5 MG EC tablet Take 5 mg by mouth daily.     cetirizine (ZYRTEC) 10 MG tablet TAKE 1 TO 2 TABLETS BY MOUTH DAILY AS NEEDED (Patient taking differently: Take 10 mg by mouth daily.) 60 tablet 5   Cholecalciferol (VITAMIN D) 50 MCG (2000 UT) tablet Take 2,000 Units by mouth daily.     Emollient (AQUAPHOR EX) Apply 1 application topically daily.     gabapentin (NEURONTIN) 300  MG capsule Take 1 capsule (300 mg total) by mouth at bedtime. 30 capsule 3   ibuprofen (ADVIL) 600 MG tablet Take 1 tablet (600 mg total) by mouth  every 6 (six) hours as needed for moderate pain. 30 tablet 0   lactulose (CHRONULAC) 10 GM/15ML solution Take 30 mLs (20 g total) by mouth 3 (three) times daily. 473 mL 3   lidocaine-prilocaine (EMLA) cream Apply to affected area once 30 g 3   metroNIDAZOLE (FLAGYL) 250 MG tablet Take 1 tablet (250 mg total) by mouth 2 (two) times daily. 20 tablet 0   morphine (MS CONTIN) 30 MG 12 hr tablet Take 1 tablet (30 mg total) by mouth at bedtime. 30 tablet 0   ondansetron (ZOFRAN) 8 MG tablet Take 1 tablet (8 mg total) by mouth every 8 (eight) hours as needed. Start on the third day after cisplatin chemotherapy. 30 tablet 1   oxybutynin (DITROPAN XL) 15 MG 24 hr tablet Take 15 mg by mouth daily.     oxyCODONE (ROXICODONE) 15 MG immediate release tablet Take 1 tablet (15 mg total) by mouth every 3 (three) hours as needed for severe pain. 60 tablet 0   polyethylene glycol (MIRALAX / GLYCOLAX) 17 g packet Take 17 g by mouth daily.     Probiotic Product (PROBIOTIC PO) Take 1 capsule by mouth daily.     prochlorperazine (COMPAZINE) 10 MG tablet Take 1 tablet (10 mg total) by mouth every 6 (six) hours as needed (Nausea or vomiting). 30 tablet 1   No current facility-administered medications for this visit.    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 VULVA, BIOPSY:  -  Squamous cell carcinoma   06/16/2020 Initial Diagnosis   The patient began experiencing vulvar burning after her knee replacement in March 2022.  She mentioned this to her primary care physician who looked at it and was unclear about the diagnosis but recommended consultation with a gynecologist.  The patient did not have an established gynecologist and therefore made a new patient consultation with Dr. Dellis Filbert.  Dr. Dellis Filbert saw the patient on 06/16/2020 which revealed a squamous cell carcinoma of the vulva.  The tumor was well  differentiated and keratinizing.  Her primary vulvar cancer was not felt to be amenable to primary resection as it was a 6cm lesion replacing the perineal body and adherent with the anal sphincter.     06/30/2020 Imaging   Ct chest, abdomen and pelvis  1. No evidence of distant metastatic disease in the chest, abdomen or pelvis. 2. The patient's known cervical cancer is not well visualized. No gross bladder or anorectal involvement. No pelvic adenopathy. 3. Tiny right middle lobe nodule, likely benign. Attention on follow-up. 4.  Aortic Atherosclerosis (ICD10-I70.0).   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   Surgeons:  Donaciano Eva, MD   Pathology: right and left inguinofemoral lymph nodes.   Operative findings: clinically suspicious medial left inguinal lymph node. 6cm perineal vulvar tumor.     07/11/2020 Pathology Results   A. LYMPH NODE, RIGHT INGUINAL FEMORAL, RESECTION:  - Metastatic keratinizing squamous cell carcinoma involving one of three lymph nodes (1/3)  - No evidence of extranodal extension   B. LYMPH NODE, LEFT INGUINAL FEMORAL, RESECTION:  - Metastatic keratinizing squamous cell carcinoma  involving two of four lymph nodes (2/4)  - No evidence of extranodal extension    08/03/2020 - 10/02/2020 Radiation Therapy   Radiation Treatment Dates: 08/03/2020 through 10/02/2020 Site Technique Total Dose (Gy) Dose per Fx (Gy) Completed Fx Beam Energies  Vulva: Pelvis IMRT 50.4/50.4 1.8 28/28 6X  Vulva: Pelvis_Bst IMRT 12.6/12.6 1.8 7/7 6X  Vulva: Pelvis_Bst_2 IMRT 10/10 2 5/5 6X      Cumulative dose to the vulvar region was 63.0 Gy.  Cumulative dose to the pelvis 50.4 Gy.  The inguinal areas were boosted to a cumulative dose of 60.4 Gy.     08/08/2020 PET scan   1. Intense activity midline along the perineum/external genitalia with differential of FDG urine  contamination versus malignancy of the external genitalia. 2. Bilateral mildly enlarged intensely hypermetabolic inguinal lymph nodes. Biopsy clips adjacent to these lymph nodes. Findings concerning for metastatic inguinal adenopathy(large benign-appearing fluid collection in the LEFT groin is presumably a post surgical seroma). 3. No evidence of metastatic pelvic iliac lymphadenopathy or retroperitoneal periaortic metastatic adenopathy. 4. No evidence of visceral metastasis. 5. No skeletal metastasis.     08/15/2020 Imaging   Vascular US RIGHT:  - No evidence of common femoral vein obstruction.     LEFT:  - There is no evidence of deep vein thrombosis in the lower extremity.     - No cystic structure found in the popliteal fossa.  - Ultrasound characteristics of several enlarged lymph nodes, and a large anechoic area, are noted in the groin.    11/03/2020 PET scan   1. No significant PET-CT findings for regression of tumor or adenopathy. 2. No new chest, abdominal or osseous metastatic disease.     11/08/2020 Pathology Results   A. VULVA, LEFT POSTERIOR, BIOPSY:  - Invasive keratinizing squamous cell carcinoma.   11/13/2020 Cancer Staging   Staging form: Vulva, AJCC 8th Edition - Clinical stage from 11/13/2020: Stage IVA (cT2, cN3, cM0) - Signed by Heath Lark, MD on 11/13/2020 Stage prefix: Initial diagnosis   11/20/2020 Procedure   Placement of a subcutaneous power-injectable port device. Catheter tip in the lower SVC   11/22/2020 -  Chemotherapy   Patient is on Treatment Plan : Vulvar cancer Cisplatin + Paclitaxel q21d plus bevacizumab       PHYSICAL EXAMINATION: ECOG PERFORMANCE STATUS: 1 - Symptomatic but completely ambulatory  Vitals:   11/28/20 0938  BP: 107/74  Pulse: (!) 101  Resp: 18  Temp: 97.6 F (36.4 C)  SpO2: 98%   Filed Weights   11/28/20 0938  Weight: 118 lb 3.2 oz (53.6 kg)    GENERAL:alert, no distress and comfortable NEURO: alert & oriented x 3  with fluent speech, no focal motor/sensory deficits  LABORATORY DATA:  I have reviewed the data as listed    Component Value Date/Time   NA 141 11/20/2020 1123   NA 142 02/18/2015 0000   K 4.4 11/20/2020 1123   CL 105 11/20/2020 1123   CO2 27 11/20/2020 1123   GLUCOSE 105 (H) 11/20/2020 1123   BUN 11 11/20/2020 1123   BUN 13 02/18/2015 0000   CREATININE 0.68 11/20/2020 1123   CALCIUM 9.6 11/20/2020 1123   PROT 7.3 11/20/2020 1123   ALBUMIN 3.5 11/20/2020 1123   AST 15 11/20/2020 1123   ALT 16 11/20/2020 1123   ALKPHOS 62 11/20/2020 1123   BILITOT 0.3 11/20/2020 1123   GFRNONAA >60 11/20/2020 1123    No results found for: SPEP, UPEP  Lab Results  Component  Value Date   WBC 11.6 (H) 11/20/2020   NEUTROABS 9.8 (H) 11/20/2020   HGB 11.4 (L) 11/20/2020   HCT 35.6 (L) 11/20/2020   MCV 94.4 11/20/2020   PLT 380 11/20/2020      Chemistry      Component Value Date/Time   NA 141 11/20/2020 1123   NA 142 02/18/2015 0000   K 4.4 11/20/2020 1123   CL 105 11/20/2020 1123   CO2 27 11/20/2020 1123   BUN 11 11/20/2020 1123   BUN 13 02/18/2015 0000   CREATININE 0.68 11/20/2020 1123   GLU 95 02/18/2015 0000      Component Value Date/Time   CALCIUM 9.6 11/20/2020 1123   ALKPHOS 62 11/20/2020 1123   AST 15 11/20/2020 1123   ALT 16 11/20/2020 1123   BILITOT 0.3 11/20/2020 1123

## 2020-11-28 NOTE — Assessment & Plan Note (Signed)
Overall, she tolerated treatment well except it was complicated by slight delayed nausea as well as constipation and poor appetite She has lost some weight She is needing less pain medicine I recommend aggressive supportive care with changes in her pain medicine and frequent oral intake I will schedule virtual visit next week for further follow-up

## 2020-11-28 NOTE — Assessment & Plan Note (Signed)
Her constipation has resolved She will continue daily MiraLAX along with Dulcolax at nighttime

## 2020-11-28 NOTE — Assessment & Plan Note (Signed)
She is needing less pain medicine I recommend she keeps MS Contin at nighttime only In the future, when she is due for refill, I plan to reduce the dose to 15 mg pill She also needs less breakthrough oxycodone She still have some lower strength 10 mg oxycodone to take as needed She can continue to take acetaminophen with oxycodone We will call her again on Friday in case she need prescription refill

## 2020-11-30 ENCOUNTER — Ambulatory Visit (INDEPENDENT_AMBULATORY_CARE_PROVIDER_SITE_OTHER): Payer: 59 | Admitting: Physician Assistant

## 2020-11-30 ENCOUNTER — Telehealth: Payer: Self-pay

## 2020-11-30 ENCOUNTER — Encounter: Payer: Self-pay | Admitting: Physician Assistant

## 2020-11-30 ENCOUNTER — Ambulatory Visit: Payer: Self-pay

## 2020-11-30 ENCOUNTER — Other Ambulatory Visit: Payer: Self-pay

## 2020-11-30 DIAGNOSIS — M722 Plantar fascial fibromatosis: Secondary | ICD-10-CM | POA: Diagnosis not present

## 2020-11-30 DIAGNOSIS — M79672 Pain in left foot: Secondary | ICD-10-CM | POA: Diagnosis not present

## 2020-11-30 MED ORDER — METHYLPREDNISOLONE ACETATE 40 MG/ML IJ SUSP
30.0000 mg | INTRAMUSCULAR | Status: AC | PRN
  Administered 2020-11-30: 30 mg

## 2020-11-30 MED ORDER — LIDOCAINE HCL 1 % IJ SOLN
1.0000 mL | INTRAMUSCULAR | Status: AC | PRN
  Administered 2020-11-30: 1 mL

## 2020-11-30 NOTE — Progress Notes (Signed)
Office Visit Note   Patient: Christina [name]           Date of Birth: March 05, 1960           MRN: [id number] Visit Date: 11/30/2020              Requested by: Burnis Medin, MD [address] [place],  [place] [address] PCP: Burnis Medin, MD  Chief Complaint  Patient presents with  . Left Heel - Follow-up      HPI: Patient is a pleasant 60 year old woman with a chief complaint of left heel pain.  She states she has a long history of plantar fasciitis and is treated this with some immobilization with a brace as well as custom orthotics and good sneakers.  She has recently undergoing treatment for a gynecological cancer.  She said she has not been as careful about wearing her sneakers and arch supports and has been on a lot of hard surfaces bare feet.  She did start wearing her brace and her sneakers.  Unfortunately she woke up this morning with more pain in her heel.  She did take an oxycodone which has helped. Visit Diagnoses:  1. Pain of left heel     Plan: Findings consistent with plantar fasciitis.  Patient's had had a injection rarely in the past which have helped.  Last injection was over 4 years ago.  I offered her an injection today she would like to go forward with this.  Also discussed of her that her sneakers may be better replaced by a pair of [employer] sneakers.  She is familiar with these as her children have them.  Also recommended updating her orthotics and I have given her a prescription to do this.  Follow-up as needed  Follow-Up Instructions: No follow-ups on file.   Ortho Exam  Patient is alert, oriented, no adenopathy, well-dressed, normal affect, normal respiratory effort. Examination she has no redness no swelling examination of her left foot and ankle she has palpable dorsalis pedis pulse no redness no cellulitis she has strong active dorsiflexion plantarflexion eversion and inversion without pain.  No pain and good range of motion through the ankle and subtalar  joint arch is maintained.  She does not really have any tenderness like she did this morning but as she told me she did take an oxycodone.  She points to the area of the medial plantar insertion of the plantar fascia as the area that was bothering her there is no swelling associated in this area  Imaging: No results found. No images are attached to the encounter.  Labs: Lab Results  Component Value Date   HGBA1C 5.7 05/18/2019   HGBA1C 5.7 12/09/2018   REPTSTATUS 09/20/2020 FINAL 09/19/2020   CULT  09/19/2020    NO GROWTH Performed at [facility], [place] [address] [place],  [address]    LABORGA ESCHERICHIA COLI (A) 02/01/2019     Lab Results  Component Value Date   ALBUMIN 3.5 11/20/2020   ALBUMIN 3.9 09/12/2020   ALBUMIN 4.3 07/07/2020    Lab Results  Component Value Date   MG 2.0 11/20/2020   No results found for: VD25OH  No results found for: PREALBUMIN CBC EXTENDED Latest Ref Rng & Units 11/20/2020 11/02/2020 09/12/2020  WBC 4.0 - 10.5 K/uL 11.6(H) 11.6(H) 5.5  RBC 3.87 - 5.11 MIL/uL 3.77(L) 3.57(L) 3.63(L)  HGB 12.0 - 15.0 g/dL 11.4(L) 11.1(L) 10.6(L)  HCT 36.0 - 46.0 % 35.6(L) 35.0(L) 33.2(L)  PLT 150 - 400 K/uL 380 424(H) 384  NEUTROABS 1.7 - 7.7 K/uL 9.8(H) - 4.1  LYMPHSABS 0.7 - 4.0 K/uL 0.6(L) - 0.3(L)     There is no height or weight on file to calculate BMI.  Orders:  Orders Placed This Encounter  Procedures  . XR Os Calcis Left   No orders of the defined types were placed in this encounter.    Procedures: Foot Inj  Date/Time: 11/30/2020 9:41 AM Performed by: Nioka Thorington, Bevely Palmer, PA Authorized by: Mcguire Gasparyan, Bevely Palmer, [place]   Consent Given by:  Patient Site marked: the procedure site was marked   Timeout: prior to procedure the correct patient, procedure, and site was verified   Indications:  Fasciitis and pain Condition: Plantar Fasciitis   Location: left plantar fascia muscle   Prep: patient was prepped and draped in usual sterile  fashion   Needle Size:  22 G Approach:  Plantar Medications:  1 mL lidocaine 1 %; 30 mg methylPREDNISolone acetate 40 MG/ML Patient Tolerance:  Patient tolerated the procedure well with no immediate complications  Clinical Data: No additional findings.  ROS:  All other systems negative, except as noted in the HPI. Review of Systems  Objective: Vital Signs: There were no vitals taken for this visit.  Specialty Comments:  No specialty comments available.  PMFS History: Patient Active Problem List   Diagnosis Date Noted  . Weight loss, non-intentional 11/28/2020  . Other constipation 11/24/2020  . Vulvar odor 11/20/2020  . Cancer associated pain 11/13/2020  . Anemia, chronic disease 11/13/2020  . Preventive measure 11/13/2020  . Vulvar pain 11/08/2020  . 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  . Cellulitis, wound, post-operative 07/18/2020  . 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 Arthritis of right knee   . 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 Degenerative arthritis of knee, bilateral 12/31/2019  . Patellofemoral arthritis of left knee 03/10/2019  . Acute medial meniscus tear of left knee 11/19/2018  . Sprain of medial collateral ligament of left knee 09/01/2018  . Neck strain, initial encounter 08/28/2018  . Degenerative disc disease, cervical 08/28/2018  . Left lumbar radiculopathy 03/05/2018  . Right lateral epicondylitis 10/30/2017  . SI (sacroiliac) joint dysfunction 10/30/2017  . Nonallopathic lesion of sacral region 10/30/2017  . Nonallopathic lesion of lumbar region 10/30/2017  . Nonallopathic lesion of thoracic region 10/30/2017  . History of recurrent UTIs 04/11/2017  . 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 Allergic rhinitis 10/29/2016   Past Medical History:  Diagnosis Date  . Arthritis   . Chicken pox   . Chronic back pain   . History of radiation therapy    Pelvis 08/03/20-10/02/20- Dr. Gery Pray  . HLD (hyperlipidemia)     diet controlled  . Hypertension   . Recurrent upper respiratory infection (URI)   . Seasonal allergies   . Vulvar cancer ([place])     Family History  Problem Relation Age of Onset  . Hyperlipidemia Mother   . Hypertension Mother   . Stroke Mother   . Breast cancer Mother   . Prostate cancer Father   . Hypertension Maternal Grandmother   . Hypertension Paternal Grandmother   . Stroke Paternal Grandmother   . Allergic rhinitis Neg Hx   . Asthma Neg Hx   . Ovarian cancer Neg Hx   . Uterine cancer Neg Hx   . Colon cancer Neg Hx   . Pancreatic cancer Neg Hx     Past Surgical History:  Procedure Laterality  Date  . BLADDER SUSPENSION    . CHOLECYSTECTOMY    . COLONOSCOPY    . INGUINAL LYMPHADENECTOMY Bilateral 07/11/2020   Procedure: INGUINAL LYMPHADENECTOMY;  Surgeon: Everitt Amber, MD;  Location: [facility];  Service: Gynecology;  Laterality: Bilateral;  . IR IMAGING GUIDED PORT INSERTION  11/15/2020  . TOTAL KNEE ARTHROPLASTY Right 04/25/2020   Procedure: RIGHT TOTAL KNEE ARTHROPLASTY;  Surgeon: Meredith Pel, MD;  Location: [facility];  Service: Orthopedics;  Laterality: Right;  . WISDOM TOOTH EXTRACTION     Social History   Occupational History  . Occupation: retired  Tobacco Use  . Smoking status: Former    Packs/day: 0.50    Years: 2.00    Pack years: 1.00    Types: Cigarettes  . Smokeless tobacco: Never  . Tobacco comments:    Smoke in 20's  Vaping Use  . Vaping Use: Never used  Substance and Sexual Activity  . Alcohol use: Not Currently  . Drug use: No  . Sexual activity: Yes    Partners: Male    Birth control/protection: Post-menopausal

## 2020-11-30 NOTE — Telephone Encounter (Signed)
Yes she may proceed

## 2020-11-30 NOTE — Telephone Encounter (Signed)
Called and given below message. She verbalized understanding and appreciated the call . °

## 2020-11-30 NOTE — Telephone Encounter (Signed)
She called and left a message. Is it okay to get a cortisone injection? Her plantar fascitis has flared up and she is going to MD now.

## 2020-12-01 ENCOUNTER — Telehealth: Payer: Self-pay

## 2020-12-01 NOTE — Telephone Encounter (Signed)
-----   Message from Heath Lark, MD sent at 12/01/2020  9:34 AM EDT ----- Can you call her and verify if she has enough pain medications for the weekend?

## 2020-12-01 NOTE — Telephone Encounter (Signed)
Called and given below message. She verbalized understanding. She has enough pain medication for the weekend.

## 2020-12-04 ENCOUNTER — Encounter: Payer: Self-pay | Admitting: Hematology and Oncology

## 2020-12-04 ENCOUNTER — Other Ambulatory Visit: Payer: Self-pay

## 2020-12-04 ENCOUNTER — Inpatient Hospital Stay (HOSPITAL_BASED_OUTPATIENT_CLINIC_OR_DEPARTMENT_OTHER): Payer: 59 | Admitting: Hematology and Oncology

## 2020-12-04 DIAGNOSIS — C519 Malignant neoplasm of vulva, unspecified: Secondary | ICD-10-CM

## 2020-12-04 DIAGNOSIS — G893 Neoplasm related pain (acute) (chronic): Secondary | ICD-10-CM

## 2020-12-04 DIAGNOSIS — R634 Abnormal weight loss: Secondary | ICD-10-CM

## 2020-12-04 MED ORDER — OXYCODONE HCL 15 MG PO TABS: 15.0000 mg | ORAL_TABLET | ORAL | 0 refills | Status: DC | PRN

## 2020-12-04 NOTE — Assessment & Plan Note (Signed)
She has slight worsening pain due to recent omission of morning morphine sulfate I refilled her prescription oxycodone today I reassured the patient it is not unusual to require more breakthrough pain medicine

## 2020-12-04 NOTE — Progress Notes (Signed)
HEMATOLOGY-ONCOLOGY ELECTRONIC VISIT PROGRESS NOTE  Patient Care Team: Panosh, Standley Brooking, MD as PCP - General (Internal Medicine) Ardis Hughs, MD as Attending Physician (Urology) Valentina Shaggy, MD as Consulting Physician (Allergy and Immunology) Susa Day, MD as Consulting Physician (Orthopedic Surgery) Gery Pray, MD as Consulting Physician (Radiation Oncology)  I connected with the patient via telephone visit today for supportive care.   ASSESSMENT & PLAN:  Squamous cell carcinoma of vulva (HCC) Overall, she is still in the path of recovery from recent treatment Continue supportive care I will see her next week as scheduled  Cancer associated pain She has slight worsening pain due to recent omission of morning morphine sulfate I refilled her prescription oxycodone today I reassured the patient it is not unusual to require more breakthrough pain medicine  Weight loss, non-intentional She is able to eat better and got her weight up back to around 120 pounds I encouraged the patient to increase oral intake as tolerated  No orders of the defined types were placed in this encounter.   INTERVAL HISTORY: Please see below for problem oriented charting. The purpose of today's visit is to evaluate pain management Overall, her pain control is fair She noticed she is requiring slightly more frequent oxycodone during daytime for breakthrough pain medicine Denies nausea or constipation Her appetite is fair and she is able to improve her weight Otherwise, she have no new symptoms  SUMMARY OF ONCOLOGIC HISTORY: Oncology History Overview Note  PD-L1 CPS score is 15%   Squamous cell carcinoma of vulva ([place])  06/15/2020 Pathology Results   A. VULVA, BIOPSY:  -  Squamous cell carcinoma   06/16/2020 Initial Diagnosis   The patient began experiencing vulvar burning after her knee replacement in March 2022.  She mentioned this to her primary care physician who looked  at it and was unclear about the diagnosis but recommended consultation with a gynecologist.  The patient did not have an established gynecologist and therefore made a new patient consultation with Dr. Dellis Filbert.  Dr. Dellis Filbert saw the patient on 06/16/2020 which revealed a squamous cell carcinoma of the vulva.  The tumor was well differentiated and keratinizing.  Her primary vulvar cancer was not felt to be amenable to primary resection as it was a 6cm lesion replacing the perineal body and adherent with the anal sphincter.     06/30/2020 Imaging   Ct chest, abdomen and pelvis  1. No evidence of distant metastatic disease in the chest, abdomen or pelvis. 2. The patient's known cervical cancer is not well visualized. No gross bladder or anorectal involvement. No pelvic adenopathy. 3. Tiny right middle lobe nodule, likely benign. Attention on follow-up. 4.  Aortic Atherosclerosis (ICD10-I70.0).   07/03/2020 Initial Diagnosis   Squamous cell carcinoma of vulva ([place])   07/11/2020 Surgery   Surgery: bilateral inguinofemoral lymphadenectomy.   Surgeons:  Donaciano Eva, MD   Pathology: right and left inguinofemoral lymph nodes.   Operative findings: clinically suspicious medial left inguinal lymph node. 6cm perineal vulvar tumor.     07/11/2020 Pathology Results   A. LYMPH NODE, RIGHT INGUINAL FEMORAL, RESECTION:  - Metastatic keratinizing squamous cell carcinoma involving one of three lymph nodes (1/3)  - No evidence of extranodal extension   B. LYMPH NODE, LEFT INGUINAL FEMORAL, RESECTION:  - Metastatic keratinizing squamous cell carcinoma involving two of four lymph nodes (2/4)  - No evidence of extranodal extension    08/03/2020 - 10/02/2020 Radiation Therapy   Radiation Treatment Dates: 08/03/2020  through 10/02/2020 Site Technique Total Dose (Gy) Dose per Fx (Gy) Completed Fx Beam Energies  Vulva: Pelvis IMRT 50.4/50.4 1.8 28/28 6X  Vulva: Pelvis_Bst IMRT 12.6/12.6 1.8 7/7 6X  Vulva:  Pelvis_Bst_2 IMRT 10/10 2 5/5 6X      Cumulative dose to the vulvar region was 63.0 Gy.  Cumulative dose to the pelvis 50.4 Gy.  The inguinal areas were boosted to a cumulative dose of 60.4 Gy.     08/08/2020 PET scan   1. Intense activity midline along the perineum/external genitalia with differential of FDG urine contamination versus malignancy of the external genitalia. 2. Bilateral mildly enlarged intensely hypermetabolic inguinal lymph nodes. Biopsy clips adjacent to these lymph nodes. Findings concerning for metastatic inguinal adenopathy(large benign-appearing fluid collection in the LEFT groin is presumably a post surgical seroma). 3. No evidence of metastatic pelvic iliac lymphadenopathy or retroperitoneal periaortic metastatic adenopathy. 4. No evidence of visceral metastasis. 5. No skeletal metastasis.     08/15/2020 Imaging   Vascular US RIGHT:  - No evidence of common femoral vein obstruction.     LEFT:  - There is no evidence of deep vein thrombosis in the lower extremity.     - No cystic structure found in the popliteal fossa.  - Ultrasound characteristics of several enlarged lymph nodes, and a large anechoic area, are noted in the groin.    11/03/2020 PET scan   1. No significant PET-CT findings for regression of tumor or adenopathy. 2. No new chest, abdominal or osseous metastatic disease.     11/08/2020 Pathology Results   A. VULVA, LEFT POSTERIOR, BIOPSY:  - Invasive keratinizing squamous cell carcinoma.   11/13/2020 Cancer Staging   Staging form: Vulva, AJCC 8th Edition - Clinical stage from 11/13/2020: Stage IVA (cT2, cN3, cM0) - Signed by Heath Lark, MD on 11/13/2020 Stage prefix: Initial diagnosis   11/20/2020 Procedure   Placement of a subcutaneous power-injectable port device. Catheter tip in the lower SVC   11/22/2020 -  Chemotherapy   Patient is on Treatment Plan : Vulvar cancer Cisplatin + Paclitaxel q21d plus bevacizumab       REVIEW OF SYSTEMS:    Constitutional: Denies fevers, chills or abnormal weight loss Eyes: Denies blurriness of vision Ears, nose, mouth, throat, and face: Denies mucositis or sore throat Respiratory: Denies cough, dyspnea or wheezes Cardiovascular: Denies palpitation, chest discomfort Gastrointestinal:  Denies nausea, heartburn or change in bowel habits Skin: Denies abnormal skin rashes Lymphatics: Denies new lymphadenopathy or easy bruising Neurological:Denies numbness, tingling or new weaknesses Behavioral/Psych: Mood is stable, no new changes  Extremities: No lower extremity edema All other systems were reviewed with the patient and are negative.  I have reviewed the past medical history, past surgical history, social history and family history with the patient and they are unchanged from previous note.  ALLERGIES:  has No Known Allergies.  MEDICATIONS:  Current Outpatient Medications  Medication Sig Dispense Refill   bisacodyl (DULCOLAX) 5 MG EC tablet Take 5 mg by mouth daily.     cetirizine (ZYRTEC) 10 MG tablet TAKE 1 TO 2 TABLETS BY MOUTH DAILY AS NEEDED (Patient taking differently: Take 10 mg by mouth daily.) 60 tablet 5   Cholecalciferol (VITAMIN D) 50 MCG (2000 UT) tablet Take 2,000 Units by mouth daily.     Emollient (AQUAPHOR EX) Apply 1 application topically daily.     gabapentin (NEURONTIN) 300 MG capsule Take 1 capsule (300 mg total) by mouth at bedtime. 30 capsule 3   ibuprofen (ADVIL)  600 MG tablet Take 1 tablet (600 mg total) by mouth every 6 (six) hours as needed for moderate pain. 30 tablet 0   lactulose (CHRONULAC) 10 GM/15ML solution Take 30 mLs (20 g total) by mouth 3 (three) times daily. 473 mL 3   lidocaine-prilocaine (EMLA) cream Apply to affected area once 30 g 3   metroNIDAZOLE (FLAGYL) 250 MG tablet Take 1 tablet (250 mg total) by mouth 2 (two) times daily. 20 tablet 0   morphine (MS CONTIN) 30 MG 12 hr tablet Take 1 tablet (30 mg total) by mouth at bedtime. 30 tablet 0    ondansetron (ZOFRAN) 8 MG tablet Take 1 tablet (8 mg total) by mouth every 8 (eight) hours as needed. Start on the third day after cisplatin chemotherapy. 30 tablet 1   oxybutynin (DITROPAN XL) 15 MG 24 hr tablet Take 15 mg by mouth daily.     oxyCODONE (ROXICODONE) 15 MG immediate release tablet Take 1 tablet (15 mg total) by mouth every 3 (three) hours as needed. 90 tablet 0   polyethylene glycol (MIRALAX / GLYCOLAX) 17 g packet Take 17 g by mouth daily.     Probiotic Product (PROBIOTIC PO) Take 1 capsule by mouth daily.     prochlorperazine (COMPAZINE) 10 MG tablet Take 1 tablet (10 mg total) by mouth every 6 (six) hours as needed (Nausea or vomiting). 30 tablet 1   No current facility-administered medications for this visit.    PHYSICAL EXAMINATION: ECOG PERFORMANCE STATUS: 1 - Symptomatic but completely ambulatory  LABORATORY DATA:  I have reviewed the data as listed CMP Latest Ref Rng & Units 11/20/2020 09/12/2020 07/07/2020  Glucose 70 - 99 mg/dL 105(H) 113(H) 98  BUN 8 - 23 mg/dL 11 10 14   Creatinine 0.44 - 1.00 mg/dL 0.68 0.63 0.71  Sodium 135 - 145 mmol/L 141 138 138  Potassium 3.5 - 5.1 mmol/L 4.4 3.7 3.7  Chloride 98 - 111 mmol/L 105 103 104  CO2 22 - 32 mmol/L 27 28 26   Calcium 8.9 - 10.3 mg/dL 9.6 9.1 9.6  Total Protein 6.5 - 8.1 g/dL 7.3 6.6 7.3  Total Bilirubin 0.3 - 1.2 mg/dL 0.3 1.3(H) 0.6  Alkaline Phos 38 - 126 U/L 62 43 49  AST 15 - 41 U/L 15 13(L) 20  ALT 0 - 44 U/L 16 12 21     Lab Results  Component Value Date   WBC 11.6 (H) 11/20/2020   HGB 11.4 (L) 11/20/2020   HCT 35.6 (L) 11/20/2020   MCV 94.4 11/20/2020   PLT 380 11/20/2020   NEUTROABS 9.8 (H) 11/20/2020    I discussed the assessment and treatment plan with the patient. The patient was provided an opportunity to ask questions and all were answered. The patient agreed with the plan and demonstrated an understanding of the instructions. The patient was advised to call back or seek an in-person  evaluation if the symptoms worsen or if the condition fails to improve as anticipated.    I spent 20 minutes for the appointment reviewing test results, discuss management and coordination of care.  Heath Lark, MD 12/04/2020 2:03 PM

## 2020-12-04 NOTE — Assessment & Plan Note (Signed)
She is able to eat better and got her weight up back to around 120 pounds I encouraged the patient to increase oral intake as tolerated

## 2020-12-04 NOTE — Assessment & Plan Note (Signed)
Overall, she is still in the path of recovery from recent treatment Continue supportive care I will see her next week as scheduled

## 2020-12-05 ENCOUNTER — Inpatient Hospital Stay (HOSPITAL_BASED_OUTPATIENT_CLINIC_OR_DEPARTMENT_OTHER): Payer: 59 | Admitting: Nurse Practitioner

## 2020-12-05 ENCOUNTER — Other Ambulatory Visit: Payer: Self-pay

## 2020-12-05 ENCOUNTER — Other Ambulatory Visit: Payer: Self-pay | Admitting: *Deleted

## 2020-12-05 VITALS — BP 169/94 | HR 94 | Temp 98.1°F | Wt 122.0 lb

## 2020-12-05 DIAGNOSIS — Z515 Encounter for palliative care: Secondary | ICD-10-CM | POA: Diagnosis not present

## 2020-12-05 DIAGNOSIS — G893 Neoplasm related pain (acute) (chronic): Secondary | ICD-10-CM | POA: Diagnosis not present

## 2020-12-05 DIAGNOSIS — C519 Malignant neoplasm of vulva, unspecified: Secondary | ICD-10-CM | POA: Diagnosis not present

## 2020-12-05 DIAGNOSIS — Z5111 Encounter for antineoplastic chemotherapy: Secondary | ICD-10-CM | POA: Diagnosis not present

## 2020-12-05 MED ORDER — CETIRIZINE HCL 10 MG PO TABS: ORAL_TABLET | ORAL | 6 refills | Status: AC

## 2020-12-05 MED ORDER — LIDOCAINE 5 % EX OINT: 1.0000 "application " | TOPICAL_OINTMENT | CUTANEOUS | 1 refills | Status: AC | PRN

## 2020-12-05 NOTE — Progress Notes (Signed)
[place]  [phone] [phone]) [phone]   Name: Christina [name] Date: 12/05/2020 MRN: [id number]  DOB: 07-31-1960  Patient Care Team: Burnis Medin, MD as PCP - General (Internal Medicine) Ardis Hughs, MD as Attending Physician (Urology) Valentina Shaggy, MD as Consulting Physician (Allergy and Immunology) Susa Day, MD as Consulting Physician (Orthopedic Surgery) Gery Pray, MD as Consulting Physician (Radiation Oncology)    REASON FOR CONSULTATION: Christina [name] is a 60 y.o. 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 Palliative Medicine consulted for symptom management and ongoing support.    SOCIAL HISTORY:     reports that she has quit smoking. Her smoking use included cigarettes. She has a 1.00 pack-year smoking history. She has never used smokeless tobacco. She reports that she does not currently use alcohol. She reports that she does not use drugs.  ADVANCE DIRECTIVES:  Patient currently has documents with plans to complete later this week.   CODE STATUS:   PAST MEDICAL HISTORY: Past Medical History:  Diagnosis Date   Arthritis    Chicken pox    Chronic back pain    History of radiation therapy    Pelvis 08/03/20-10/02/20- Dr. Gery Pray   HLD (hyperlipidemia)    diet controlled   Hypertension    Recurrent upper respiratory infection (URI)    Seasonal allergies    Vulvar cancer ([place])     PAST SURGICAL HISTORY:  Past Surgical History:  Procedure Laterality Date   BLADDER SUSPENSION     CHOLECYSTECTOMY     COLONOSCOPY     INGUINAL LYMPHADENECTOMY Bilateral 07/11/2020   Procedure: INGUINAL LYMPHADENECTOMY;  Surgeon: Everitt Amber, MD;  Location: [facility];  Service: Gynecology;  Laterality: Bilateral;   IR IMAGING GUIDED PORT INSERTION   11/15/2020   TOTAL KNEE ARTHROPLASTY Right 04/25/2020   Procedure: RIGHT TOTAL KNEE ARTHROPLASTY;  Surgeon: Meredith Pel, MD;  Location: [place];  Service: Orthopedics;  Laterality: Right;   WISDOM TOOTH EXTRACTION      HEMATOLOGY/ONCOLOGY HISTORY:  Oncology History Overview Note  PD-L1 CPS score is 15%   Squamous cell carcinoma of vulva ([place])  06/15/2020 Pathology Results   A. VULVA, BIOPSY:  -  Squamous cell carcinoma   06/16/2020 Initial Diagnosis   The patient began experiencing vulvar burning after her knee replacement in March 2022.  She mentioned this to her primary care physician who looked at it and was unclear about the diagnosis but recommended consultation with a gynecologist.  The patient did not have an established gynecologist and therefore made a new patient consultation with Dr. Dellis Filbert.  Dr. Dellis Filbert saw the patient on 06/16/2020 which revealed a squamous cell carcinoma of the vulva.  The tumor was well differentiated and keratinizing.  Her primary vulvar cancer was not felt to be amenable to primary resection as it was a 6cm lesion replacing the perineal body and adherent with the anal sphincter.     06/30/2020 Imaging   Ct chest, abdomen and pelvis  1. No evidence of distant metastatic disease in the chest, abdomen or pelvis. 2. The patient's known cervical cancer is not well visualized. No gross bladder or anorectal involvement. No pelvic adenopathy. 3. Tiny right middle lobe nodule, likely benign. Attention on follow-up. 4.  Aortic Atherosclerosis (ICD10-I70.0).   07/03/2020 Initial Diagnosis   Squamous cell carcinoma of vulva ([place])  07/11/2020 Surgery   Surgery: bilateral inguinofemoral lymphadenectomy.   Surgeons:  Donaciano Eva, MD   Pathology: right and left inguinofemoral lymph nodes.   Operative findings: clinically suspicious medial left inguinal lymph node. 6cm perineal vulvar tumor.     07/11/2020 Pathology Results   A. LYMPH NODE, RIGHT  INGUINAL FEMORAL, RESECTION:  - Metastatic keratinizing squamous cell carcinoma involving one of three lymph nodes (1/3)  - No evidence of extranodal extension   B. LYMPH NODE, LEFT INGUINAL FEMORAL, RESECTION:  - Metastatic keratinizing squamous cell carcinoma involving two of four lymph nodes (2/4)  - No evidence of extranodal extension    08/03/2020 - 10/02/2020 Radiation Therapy   Radiation Treatment Dates: 08/03/2020 through 10/02/2020 Site Technique Total Dose (Gy) Dose per Fx (Gy) Completed Fx Beam Energies  Vulva: Pelvis IMRT 50.4/50.4 1.8 28/28 6X  Vulva: Pelvis_Bst IMRT 12.6/12.6 1.8 7/7 6X  Vulva: Pelvis_Bst_2 IMRT 10/10 2 5/5 6X      Cumulative dose to the vulvar region was 63.0 Gy.  Cumulative dose to the pelvis 50.4 Gy.  The inguinal areas were boosted to a cumulative dose of 60.4 Gy.     08/08/2020 PET scan   1. Intense activity midline along the perineum/external genitalia with differential of FDG urine contamination versus malignancy of the external genitalia. 2. Bilateral mildly enlarged intensely hypermetabolic inguinal lymph nodes. Biopsy clips adjacent to these lymph nodes. Findings concerning for metastatic inguinal adenopathy(large benign-appearing fluid collection in the LEFT groin is presumably a post surgical seroma). 3. No evidence of metastatic pelvic iliac lymphadenopathy or retroperitoneal periaortic metastatic adenopathy. 4. No evidence of visceral metastasis. 5. No skeletal metastasis.     08/15/2020 Imaging   Vascular US RIGHT:  - No evidence of common femoral vein obstruction.     LEFT:  - There is no evidence of deep vein thrombosis in the lower extremity.     - No cystic structure found in the popliteal fossa.  - Ultrasound characteristics of several enlarged lymph nodes, and a large anechoic area, are noted in the groin.    11/03/2020 PET scan   1. No significant PET-CT findings for regression of tumor or adenopathy. 2. No new chest, abdominal or  osseous metastatic disease.     11/08/2020 Pathology Results   A. VULVA, LEFT POSTERIOR, BIOPSY:  - Invasive keratinizing squamous cell carcinoma.   11/13/2020 Cancer Staging   Staging form: Vulva, AJCC 8th Edition - Clinical stage from 11/13/2020: Stage IVA (cT2, cN3, cM0) - Signed by Heath Lark, MD on 11/13/2020 Stage prefix: Initial diagnosis   11/20/2020 Procedure   Placement of a subcutaneous power-injectable port device. Catheter tip in the lower SVC   11/22/2020 -  Chemotherapy   Patient is on Treatment Plan : Vulvar cancer Cisplatin + Paclitaxel q21d plus bevacizumab       ALLERGIES:  has No Known Allergies.  MEDICATIONS:  Current Outpatient Medications  Medication Sig Dispense Refill   bisacodyl (DULCOLAX) 5 MG EC tablet Take 5 mg by mouth daily.     cetirizine (ZYRTEC) 10 MG tablet TAKE 1 TO 2 TABLETS BY MOUTH DAILY AS NEEDED 60 tablet 6   Cholecalciferol (VITAMIN D) 50 MCG (2000 UT) tablet Take 2,000 Units by mouth daily.     Emollient (AQUAPHOR EX) Apply 1 application topically daily.     gabapentin (NEURONTIN) 300 MG capsule Take 1 capsule (300 mg total) by mouth at bedtime. 30 capsule 3   ibuprofen (ADVIL) 600 MG tablet Take 1 tablet (600  mg total) by mouth every 6 (six) hours as needed for moderate pain. 30 tablet 0   lactulose (CHRONULAC) 10 GM/15ML solution Take 30 mLs (20 g total) by mouth 3 (three) times daily. 473 mL 3   lidocaine-prilocaine (EMLA) cream Apply to affected area once 30 g 3   metroNIDAZOLE (FLAGYL) 250 MG tablet Take 1 tablet (250 mg total) by mouth 2 (two) times daily. 20 tablet 0   morphine (MS CONTIN) 30 MG 12 hr tablet Take 1 tablet (30 mg total) by mouth at bedtime. 30 tablet 0   ondansetron (ZOFRAN) 8 MG tablet Take 1 tablet (8 mg total) by mouth every 8 (eight) hours as needed. Start on the third day after cisplatin chemotherapy. 30 tablet 1   oxybutynin (DITROPAN XL) 15 MG 24 hr tablet Take 15 mg by mouth daily.     oxyCODONE (ROXICODONE) 15  MG immediate release tablet Take 1 tablet (15 mg total) by mouth every 3 (three) hours as needed. 90 tablet 0   polyethylene glycol (MIRALAX / GLYCOLAX) 17 g packet Take 17 g by mouth daily.     Probiotic Product (PROBIOTIC PO) Take 1 capsule by mouth daily.     prochlorperazine (COMPAZINE) 10 MG tablet Take 1 tablet (10 mg total) by mouth every 6 (six) hours as needed (Nausea or vomiting). 30 tablet 1   No current facility-administered medications for this visit.    VITAL SIGNS: BP (!) 169/94 (BP Location: Left Arm, Patient Position: Sitting)   Pulse 94   Temp 98.1 F (36.7 C) (Oral)   Wt 122 lb (55.3 kg)   SpO2 95%   BMI 23.05 kg/m  Filed Weights   12/05/20 1311  Weight: 122 lb (55.3 kg)    Estimated body mass index is 23.05 kg/m as calculated from the following:   Height as of 11/28/20: '5\' 1"'  (1.549 m).   Weight as of this encounter: 122 lb (55.3 kg).  LABS: CBC:    Component Value Date/Time   WBC 11.6 (H) 11/20/2020 1123   WBC 11.6 (H) 11/02/2020 1228   HGB 11.4 (L) 11/20/2020 1123   HCT 35.6 (L) 11/20/2020 1123   PLT 380 11/20/2020 1123   MCV 94.4 11/20/2020 1123   NEUTROABS 9.8 (H) 11/20/2020 1123   LYMPHSABS 0.6 (L) 11/20/2020 1123   MONOABS 0.9 11/20/2020 1123   EOSABS 0.2 11/20/2020 1123   BASOSABS 0.0 11/20/2020 1123   Comprehensive Metabolic Panel:    Component Value Date/Time   NA 141 11/20/2020 1123   NA 142 02/18/2015 0000   K 4.4 11/20/2020 1123   CL 105 11/20/2020 1123   CO2 27 11/20/2020 1123   BUN 11 11/20/2020 1123   BUN 13 02/18/2015 0000   CREATININE 0.68 11/20/2020 1123   GLUCOSE 105 (H) 11/20/2020 1123   CALCIUM 9.6 11/20/2020 1123   AST 15 11/20/2020 1123   ALT 16 11/20/2020 1123   ALKPHOS 62 11/20/2020 1123   BILITOT 0.3 11/20/2020 1123   PROT 7.3 11/20/2020 1123   ALBUMIN 3.5 11/20/2020 1123    RADIOGRAPHIC STUDIES: IR IMAGING GUIDED PORT INSERTION  Result Date: 11/15/2020 INDICATION: 60 year old with squamous cell carcinoma  of the vulva. Port-A-Cath needed for chemotherapy. EXAM: FLUOROSCOPIC AND ULTRASOUND GUIDED PLACEMENT OF A SUBCUTANEOUS PORT COMPARISON:  None. MEDICATIONS: Moderate sedation ANESTHESIA/SEDATION: Versed 3.0 mg IV; Fentanyl 125 mcg IV; Moderate Sedation Time:  23 minutes The patient was continuously monitored during the procedure by the interventional radiology nurse under my direct supervision. FLUOROSCOPY TIME:  36 seconds, 1 mGy COMPLICATIONS: None immediate. PROCEDURE: The procedure, risks, benefits, and alternatives were explained to the patient. Questions regarding the procedure were encouraged and answered. The patient understands and consents to the procedure. Patient was placed supine on the interventional table. Ultrasound confirmed a patent right internal jugular vein. Ultrasound image was saved for documentation. The right chest and neck were cleaned with a skin antiseptic and a sterile drape was placed. Maximal barrier sterile technique was utilized including caps, mask, sterile gowns, sterile gloves, sterile drape, hand hygiene and skin antiseptic. The right neck was anesthetized with 1% lidocaine. Small incision was made in the right neck with a blade. Micropuncture set was placed in the right internal jugular vein with ultrasound guidance. The micropuncture wire was used for measurement purposes. The right chest was anesthetized with 1% lidocaine with epinephrine. #15 blade was used to make an incision and a subcutaneous port pocket was formed. Superior was assembled. Subcutaneous tunnel was formed with a stiff tunneling device. The port catheter was brought through the subcutaneous tunnel. The port was placed in the subcutaneous pocket and sutured in place. The micropuncture set was exchanged for a peel-away sheath. The catheter was placed through the peel-away sheath and the tip was positioned in the lower SVC. Catheter placement was confirmed with fluoroscopy. The port was accessed and  flushed with heparinized saline. The port pocket was closed using two layers of absorbable sutures and Dermabond. The vein skin site was closed using a single layer of absorbable suture and Dermabond. Sterile dressings were applied. Patient tolerated the procedure well without an immediate complication. Ultrasound and fluoroscopic images were taken and saved for this procedure. IMPRESSION: Placement of a subcutaneous power-injectable port device. Catheter tip in the lower SVC. Electronically Signed   By: Markus Daft M.D.   On: 11/15/2020 17:39    PERFORMANCE STATUS (ECOG) : 1 - Symptomatic but completely ambulatory  Review of Systems  Genitourinary:  Positive for vaginal pain.  Unless otherwise noted, a complete review of systems is negative.  Physical Exam General: NAD, pain with sitting GU: vulvovaginal drainage,  tenderness Neurological: AAO x3, mood appropriate   IMPRESSION:   Today is my first encounter with Mrs. Quattrone. I introduced myself and the role of Palliative support in collaboration with her Oncologist and care team. Patient verbalized understanding and appreciation.   Mrs. Junkins is tearful on today. Shares as she was brushing her hair this morning noticed it was coming out due to her treatments. Emotional support provided. We discussed possible cutting it shorter vs. Completely shaving off. She has an appointment with her beautician to further discuss. She also shares changes to her quality of life. She once was an avid Pickle ball and tennis player however have been unable to do so since her cancer diagnosis. She plays cards with a group of close lady friends who have been very supportive to her during this journey however speaks to her inability to enjoy due to pain with sitting for long periods of time.   Mrs. Sivley acknowledges her treatment options and care is her choice and she is remaining hopeful for some improvement in pain and quality of life with treatment. Support provided. She  feels her current pain regimen is providing some relief to her pain. We discussed use of lidocaine cream/ointment to vulvar areas for additional comfort. Patient states she has used this in past and it provided some temporary relief. She would like to try the ointment again  in addition to oral medications with hopes of maximizing her pain and discomfort.   Patient shares she has advance care documents with plans to complete later this week. She has no further questions regarding content of the documents.   All questions answered and support provided.     PLAN: Continue current pain regimen as prescribed by Dr. Alvy Bimler  Patient planning to complete advanced directives this week.  Lidocaine ointment to vulvovaginal area with a goal of maximizing discomfort/pain relief in addition to oral medications. I will plan to follow-up with Mrs. Sassaman in a month in collaboration with oncology visits/treatment schedule.    Patient expressed understanding and was in agreement with this plan. She also understands that She can call the clinic at any time with any questions, concerns, or complaints.     Time Total: 45 min.   Visit consisted of counseling and education dealing with the complex and emotionally intense issues of symptom management and palliative care in the setting of serious and potentially life-threatening illness.Greater than 50%  of this time was spent counseling and coordinating care related to the above assessment and plan.  Signed by: Alda Lea, AGPCNP-BC Palliative Medicine Team  Pager: [phone] Amion: Bjorn Pippin

## 2020-12-06 ENCOUNTER — Encounter: Payer: Self-pay | Admitting: Hematology and Oncology

## 2020-12-06 ENCOUNTER — Telehealth: Payer: Self-pay | Admitting: Nurse Practitioner

## 2020-12-06 ENCOUNTER — Encounter: Payer: Self-pay | Admitting: Nurse Practitioner

## 2020-12-06 NOTE — Telephone Encounter (Signed)
Scheduled per 10/18 sch msg, message was left with pt

## 2020-12-07 ENCOUNTER — Telehealth: Payer: Self-pay | Admitting: Oncology

## 2020-12-07 NOTE — Telephone Encounter (Signed)
Let's stop all laxatives and see what happens Tell her to take supplemental drink or smoothies after meals twice daily

## 2020-12-07 NOTE — Telephone Encounter (Signed)
Christina [name] called and wanted to let Dr. Alvy Bimler know that she is having problems eating.  Her weight is down to 117 lbs. She said she takes a few bites and then has to have a bowel movement. She is having several bowel movements a day  She is taking Miralax in the morning and dulcolax in the evening.

## 2020-12-07 NOTE — Telephone Encounter (Signed)
Called Christina [name] and advised her of recommendations from Dr. Alvy Bimler.  She verbalized understanding and will call tomorrow to let us know how she is doing.

## 2020-12-11 ENCOUNTER — Encounter: Payer: Self-pay | Admitting: Hematology and Oncology

## 2020-12-11 ENCOUNTER — Telehealth: Payer: Self-pay | Admitting: Oncology

## 2020-12-11 NOTE — Telephone Encounter (Signed)
Christina [name] called and said she has been struggling with constipation and thinks her tumor is spreading back towards her anus.  She is going to send a picture.  She said she started taking Miralax in the mornings again and [place] in the afternoons.  She has had a bowel movement this morning and said she will feel good for a while but then will start having pressure and feeling like she needs to have a bowel movement again.  She is afraid to eat and said she really can't do anything.  She is very tearful.  Advised her that I will notify Dr. Alvy Bimler and call her back with any recommendations.

## 2020-12-11 NOTE — Telephone Encounter (Signed)
Called Christina [name] back and advised her of message below from Dr. Alvy Bimler.  She verbalized understanding and would like to keep her scheduled appointment times tomorrow.

## 2020-12-11 NOTE — Telephone Encounter (Signed)
Please remind her to bring her documented intake and bowel movement chart with her in the next visit The pressure sensation is related to disease but I felt majority of the cause of constipation is from side-effects from pain medication As long as she continues to have bowel movement is a good thing Unfortunately I do not have availability to add her on today but since she is coming in tomorrow, I can potential see her earlier at 29 am before her scheduled appointment or keep it at the same as scheduled

## 2020-12-12 ENCOUNTER — Other Ambulatory Visit: Payer: Self-pay | Admitting: Hematology and Oncology

## 2020-12-12 ENCOUNTER — Inpatient Hospital Stay: Payer: 59

## 2020-12-12 ENCOUNTER — Inpatient Hospital Stay (HOSPITAL_BASED_OUTPATIENT_CLINIC_OR_DEPARTMENT_OTHER): Payer: 59 | Admitting: Hematology and Oncology

## 2020-12-12 ENCOUNTER — Other Ambulatory Visit: Payer: Self-pay

## 2020-12-12 ENCOUNTER — Encounter: Payer: Self-pay | Admitting: Hematology and Oncology

## 2020-12-12 ENCOUNTER — Other Ambulatory Visit (HOSPITAL_COMMUNITY): Payer: Self-pay

## 2020-12-12 VITALS — BP 140/66 | HR 86 | Resp 16

## 2020-12-12 VITALS — BP 152/89 | HR 95 | Temp 97.6°F | Resp 18 | Ht 61.0 in | Wt 118.6 lb

## 2020-12-12 DIAGNOSIS — R634 Abnormal weight loss: Secondary | ICD-10-CM

## 2020-12-12 DIAGNOSIS — C519 Malignant neoplasm of vulva, unspecified: Secondary | ICD-10-CM

## 2020-12-12 DIAGNOSIS — G893 Neoplasm related pain (acute) (chronic): Secondary | ICD-10-CM

## 2020-12-12 DIAGNOSIS — I10 Essential (primary) hypertension: Secondary | ICD-10-CM

## 2020-12-12 DIAGNOSIS — K5909 Other constipation: Secondary | ICD-10-CM | POA: Diagnosis not present

## 2020-12-12 DIAGNOSIS — Z5111 Encounter for antineoplastic chemotherapy: Secondary | ICD-10-CM | POA: Diagnosis not present

## 2020-12-12 LAB — CBC WITH DIFFERENTIAL (CANCER CENTER ONLY)
Abs Immature Granulocytes: 0.18 10*3/uL — ABNORMAL HIGH (ref 0.00–0.07)
Basophils Absolute: 0.1 10*3/uL (ref 0.0–0.1)
Basophils Relative: 1 %
Eosinophils Absolute: 0.1 10*3/uL (ref 0.0–0.5)
Eosinophils Relative: 0 %
HCT: 31.3 % — ABNORMAL LOW (ref 36.0–46.0)
Hemoglobin: 10.6 g/dL — ABNORMAL LOW (ref 12.0–15.0)
Immature Granulocytes: 1 %
Lymphocytes Relative: 3 %
Lymphs Abs: 0.5 10*3/uL — ABNORMAL LOW (ref 0.7–4.0)
MCH: 29.9 pg (ref 26.0–34.0)
MCHC: 33.9 g/dL (ref 30.0–36.0)
MCV: 88.2 fL (ref 80.0–100.0)
Monocytes Absolute: 0.8 10*3/uL (ref 0.1–1.0)
Monocytes Relative: 5 %
Neutro Abs: 15.6 10*3/uL — ABNORMAL HIGH (ref 1.7–7.7)
Neutrophils Relative %: 90 %
Platelet Count: 440 10*3/uL — ABNORMAL HIGH (ref 150–400)
RBC: 3.55 MIL/uL — ABNORMAL LOW (ref 3.87–5.11)
RDW: 13 % (ref 11.5–15.5)
WBC Count: 17.3 10*3/uL — ABNORMAL HIGH (ref 4.0–10.5)
nRBC: 0 % (ref 0.0–0.2)

## 2020-12-12 LAB — CMP (CANCER CENTER ONLY)
ALT: 24 U/L (ref 0–44)
AST: 22 U/L (ref 15–41)
Albumin: 3.1 g/dL — ABNORMAL LOW (ref 3.5–5.0)
Alkaline Phosphatase: 73 U/L (ref 38–126)
Anion gap: 12 (ref 5–15)
BUN: 13 mg/dL (ref 8–23)
CO2: 28 mmol/L (ref 22–32)
Calcium: 9.5 mg/dL (ref 8.9–10.3)
Chloride: 95 mmol/L — ABNORMAL LOW (ref 98–111)
Creatinine: 0.72 mg/dL (ref 0.44–1.00)
GFR, Estimated: >60 mL/min (ref >60)
Glucose, Bld: 122 mg/dL — ABNORMAL HIGH (ref 70–99)
Potassium: 3.4 mmol/L — ABNORMAL LOW (ref 3.5–5.1)
Sodium: 135 mmol/L (ref 135–145)
Total Bilirubin: 0.4 mg/dL (ref 0.3–1.2)
Total Protein: 7.2 g/dL (ref 6.5–8.1)

## 2020-12-12 LAB — TOTAL PROTEIN, URINE DIPSTICK: Protein, ur: <30 mg/dL — AB

## 2020-12-12 LAB — MAGNESIUM: Magnesium: 1.5 mg/dL — ABNORMAL LOW (ref 1.7–2.4)

## 2020-12-12 MED ORDER — OXYCODONE HCL 30 MG PO TABS
30.0000 mg | ORAL_TABLET | ORAL | 0 refills | Status: DC | PRN
  Filled 2020-12-12: qty 90, 12d supply, fill #0

## 2020-12-12 MED ORDER — SODIUM CHLORIDE 0.9% FLUSH
10.0000 mL | Freq: Once | INTRAVENOUS | Status: AC | PRN
  Administered 2020-12-12: 10 mL

## 2020-12-12 MED ORDER — DEXAMETHASONE 4 MG PO TABS: 4.0000 mg | ORAL_TABLET | Freq: Once | ORAL | Status: DC

## 2020-12-12 MED ORDER — HEPARIN SOD (PORK) LOCK FLUSH 100 UNIT/ML IV SOLN
500.0000 [IU] | Freq: Once | INTRAVENOUS | Status: AC
  Administered 2020-12-12: 500 [IU]

## 2020-12-12 MED ORDER — DEXAMETHASONE 4 MG PO TABS
4.0000 mg | ORAL_TABLET | Freq: Once | ORAL | Status: AC
  Administered 2020-12-12: 4 mg via ORAL
  Filled 2020-12-12: qty 1

## 2020-12-12 MED ORDER — OXYCODONE HCL 5 MG PO TABS
30.0000 mg | ORAL_TABLET | Freq: Once | ORAL | Status: DC
Start: 2020-12-12 — End: 2020-12-12

## 2020-12-12 MED ORDER — OXYCODONE HCL 5 MG PO TABS
30.0000 mg | ORAL_TABLET | Freq: Once | ORAL | Status: AC
  Administered 2020-12-12: 30 mg via ORAL
  Filled 2020-12-12: qty 6

## 2020-12-12 MED ORDER — DEXAMETHASONE 4 MG PO TABS
4.0000 mg | ORAL_TABLET | Freq: Every day | ORAL | 3 refills | Status: DC
  Filled 2020-12-12: qty 30, 30d supply, fill #0

## 2020-12-12 MED ORDER — SODIUM CHLORIDE 0.9% FLUSH
10.0000 mL | Freq: Once | INTRAVENOUS | Status: AC
  Administered 2020-12-12: 10 mL

## 2020-12-12 MED ORDER — SODIUM CHLORIDE 0.9 % IV SOLN: Freq: Once | INTRAVENOUS | Status: AC

## 2020-12-12 MED ORDER — OXYCODONE HCL 5 MG PO TABS: 30.0000 mg | ORAL_TABLET | Freq: Once | ORAL | Status: DC

## 2020-12-12 MED ORDER — HEPARIN SOD (PORK) LOCK FLUSH 100 UNIT/ML IV SOLN
500.0000 [IU] | Freq: Once | INTRAVENOUS | Status: AC | PRN
  Administered 2020-12-12: 500 [IU]

## 2020-12-12 MED ORDER — ALTEPLASE 2 MG IJ SOLR: 2.0000 mg | Freq: Once | INTRAMUSCULAR | Status: DC | PRN

## 2020-12-12 MED ORDER — MAGNESIUM OXIDE -MG SUPPLEMENT 400 (240 MG) MG PO TABS
400.0000 mg | ORAL_TABLET | Freq: Every day | ORAL | 1 refills | Status: AC
  Filled 2020-12-12: qty 30, 30d supply, fill #0

## 2020-12-12 MED FILL — Fosaprepitant Dimeglumine For IV Infusion 150 MG (Base Eq): INTRAVENOUS | Qty: 5 | Status: AC

## 2020-12-12 MED FILL — Dexamethasone Sodium Phosphate Inj 100 MG/10ML: INTRAMUSCULAR | Qty: 1 | Status: AC

## 2020-12-12 NOTE — Assessment & Plan Note (Addendum)
She has poorly controlled pain We have adjusted her pain medicine several times She tolerated MS Contin better only once daily during nighttime I plan to increase breakthrough oxycodone to 30 mg as needed and will continue to assess pain control over the next few days We did discuss potential admission to the hospital for IV pain medicine to get better control of pain I reassessed her twice in the infusion room after giving her oral oxycodone 30 mg dose Her pain level has improved and hence we decided to forego admission and continue to manage pain in the outpatient setting I did warn her about risk of sedation and constipation with treatment and we will monitor that closely for now

## 2020-12-12 NOTE — Addendum Note (Signed)
Addended by: Dicie Beam D on: 12/12/2020 10:21 AM   Modules accepted: Orders

## 2020-12-12 NOTE — Progress Notes (Unsigned)
a 

## 2020-12-12 NOTE — Patient Instructions (Signed)

## 2020-12-12 NOTE — Assessment & Plan Note (Signed)
Her blood pressure is profoundly elevated likely due to uncontrolled pain I recommend close observation for now We will proceed with additional bevacizumab as scheduled tomorrow

## 2020-12-12 NOTE — Assessment & Plan Note (Signed)
This is due to side effects of treatment I will start her on daily magnesium oxide

## 2020-12-12 NOTE — Progress Notes (Signed)
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[facility]) She has profound, uncontrolled pain again even though she had 1-1/2 weeks of good days after recent chemotherapy We will proceed with cycle 2 of treatment tomorrow along with aggressive supportive care I reminded the patient and family to give her daily dexamethasone starting day 4 of treatment for several days to improve her energy and appetite I will check on her again tomorrow for additional supportive care I will also schedule IV fluids for Thursday and Friday as well as next week I recommend minimum 3 cycles of chemotherapy before repeating imaging studies  Cancer associated pain She has poorly controlled pain We have adjusted her pain medicine several times She tolerated MS Contin better only once daily during nighttime I plan to increase breakthrough oxycodone to 30 mg as needed and will continue to assess pain control over the next few days We did discuss potential admission to the hospital for IV pain medicine to get better control of pain I reassessed her twice in the infusion room after giving her oral oxycodone 30 mg dose Her pain level has improved and hence we decided to forego admission and continue to manage pain in the outpatient setting I did warn her about risk of sedation and constipation with treatment and we will monitor that closely for now  Other constipation She has recurrent constipation likely secondary to changes in medications We discussed importance of regular laxative therapy  Hypomagnesemia This is due to side  effects of treatment I will start her on daily magnesium oxide  Weight loss, non-intentional She has lost weight due to recent nausea and uncontrolled pain We discussed importance of frequent small meals She appears dehydrated I recommend daily IV fluid and she is in agreement  Hypertension, essential, benign Her blood pressure is profoundly elevated likely due to uncontrolled pain I recommend close observation for now We will proceed with additional bevacizumab as scheduled tomorrow  No orders of the defined types were placed in this encounter.   All questions were answered. The patient knows to call the clinic with any problems, questions or concerns. The total time spent in the appointment was 80 minutes encounter with patients including review of chart and various tests results, discussions about plan of care and coordination of care plan   Heath Lark, MD 12/12/2020 1:43 PM  INTERVAL HISTORY: Please see below for problem oriented charting. she returns for treatment follow-up with her daughter and husband Last week, she was seen by palliative care service team and thought that I was involved in her care at that time She was crying in tears with severe pain since last week She is almost out of oxycodone 15 mg tablets and that barely controls the pain She have lost weight due to uncontrolled pain that affected her appetite Initially, she had diarrhea.  Since we back off from laxative, she become constipated She missed bowel movement last Saturday.  Since then, with laxative, she had small frequent bowel movement and is extremely concerned that this is due to her cancer progressing She is weak and mentally felt like she is given up of fighting cancer I reviewed the patient's  several times throughout the day.  She was subsequently brought into the infusion room for IV fluids and pain management and at the end of an hour after pain medicine, she has clinically improved  REVIEW OF  SYSTEMS:   Constitutional: Denies fevers, chills  Eyes: Denies blurriness of vision Ears, nose, mouth, throat, and face: Denies mucositis or sore throat Respiratory: Denies cough, dyspnea or wheezes Cardiovascular: Denies palpitation, chest discomfort or lower extremity swelling Skin: Denies abnormal skin rashes Lymphatics: Denies new lymphadenopathy or easy bruising Neurological:Denies numbness, tingling or new weaknesses Behavioral/Psych: Mood is stable, no new changes  All other systems were reviewed with the patient and are negative.  I have reviewed the past medical history, past surgical history, social history and family history with the patient and they are unchanged from previous note.  ALLERGIES:  has No Known Allergies.  MEDICATIONS:  Current Outpatient Medications  Medication Sig Dispense Refill   dexamethasone (DECADRON) 4 MG tablet Take 1 tablet (4 mg total) by mouth daily. 30 tablet 3   bisacodyl (DULCOLAX) 5 MG EC tablet Take 5 mg by mouth daily.     cetirizine (ZYRTEC) 10 MG tablet TAKE 1 TO 2 TABLETS BY MOUTH DAILY AS NEEDED 60 tablet 6   Cholecalciferol (VITAMIN D) 50 MCG (2000 UT) tablet Take 2,000 Units by mouth daily.     Emollient (AQUAPHOR EX) Apply 1 application topically daily.     gabapentin (NEURONTIN) 300 MG capsule Take 1 capsule (300 mg total) by mouth at bedtime. 30 capsule 3   lactulose (CHRONULAC) 10 GM/15ML solution Take 30 mLs (20 g total) by mouth 3 (three) times daily. 473 mL 3   lidocaine (XYLOCAINE) 5 % ointment Apply 1 application topically as needed for mild pain or moderate pain. 35.44 g 1   lidocaine-prilocaine (EMLA) cream Apply to affected area once 30 g 3   magnesium oxide (MAG-OX) 400 (240 Mg) MG tablet Take 1 tablet (400 mg total) by mouth daily. 30 tablet 1   metroNIDAZOLE (FLAGYL) 250 MG tablet Take 1 tablet (250 mg total) by mouth 2 (two) times daily. 20 tablet 0   morphine (MS CONTIN) 30 MG 12 hr tablet Take 1 tablet (30 mg total) by  mouth at bedtime. 30 tablet 0   ondansetron (ZOFRAN) 8 MG tablet Take 1 tablet (8 mg total) by mouth every 8 (eight) hours as needed. Start on the third day after cisplatin chemotherapy. 30 tablet 1   oxybutynin (DITROPAN XL) 15 MG 24 hr tablet Take 15 mg by mouth daily.     oxycodone (ROXICODONE) 30 MG immediate release tablet Take 1 tablet (30 mg total) by mouth every 3 (three) hours as needed. 90 tablet 0   polyethylene glycol (MIRALAX / GLYCOLAX) 17 g packet Take 17 g by mouth daily.     Probiotic Product (PROBIOTIC PO) Take 1 capsule by mouth daily.     prochlorperazine (COMPAZINE) 10 MG tablet Take 1 tablet (10 mg total) by mouth every 6 (six) hours as needed (Nausea or vomiting). 30 tablet 1   No current facility-administered medications for this visit.   Facility-Administered Medications Ordered in Other Visits  Medication Dose Route Frequency Provider Last Rate Last Admin   alteplase (CATHFLO ACTIVASE) injection 2 mg  2 mg Intracatheter Once PRN Heath Lark, MD        SUMMARY OF ONCOLOGIC HISTORY: Oncology History Overview Note  PD-L1 CPS score is 15%   Squamous cell carcinoma of vulva ([place])  06/15/2020 Pathology Results  A. VULVA, BIOPSY:  -  Squamous cell carcinoma   06/16/2020 Initial Diagnosis   The patient began experiencing vulvar burning after her knee replacement in March 2022.  She mentioned this to her primary care physician who looked at it and was unclear about the diagnosis but recommended consultation with a gynecologist.  The patient did not have an established gynecologist and therefore made a new patient consultation with Dr. Dellis Filbert.  Dr. Dellis Filbert saw the patient on 06/16/2020 which revealed a squamous cell carcinoma of the vulva.  The tumor was well differentiated and keratinizing.  Her primary vulvar cancer was not felt to be amenable to primary resection as it was a 6cm lesion replacing the perineal body and adherent with the anal sphincter.     06/30/2020  Imaging   Ct chest, abdomen and pelvis  1. No evidence of distant metastatic disease in the chest, abdomen or pelvis. 2. The patient's known cervical cancer is not well visualized. No gross bladder or anorectal involvement. No pelvic adenopathy. 3. Tiny right middle lobe nodule, likely benign. Attention on follow-up. 4.  Aortic Atherosclerosis (ICD10-I70.0).   07/03/2020 Initial Diagnosis   Squamous cell carcinoma of vulva ([place])   07/11/2020 Surgery   Surgery: bilateral inguinofemoral lymphadenectomy.   Surgeons:  Donaciano Eva, MD   Pathology: right and left inguinofemoral lymph nodes.   Operative findings: clinically suspicious medial left inguinal lymph node. 6cm perineal vulvar tumor.     07/11/2020 Pathology Results   A. LYMPH NODE, RIGHT INGUINAL FEMORAL, RESECTION:  - Metastatic keratinizing squamous cell carcinoma involving one of three lymph nodes (1/3)  - No evidence of extranodal extension   B. LYMPH NODE, LEFT INGUINAL FEMORAL, RESECTION:  - Metastatic keratinizing squamous cell carcinoma involving two of four lymph nodes (2/4)  - No evidence of extranodal extension    08/03/2020 - 10/02/2020 Radiation Therapy   Radiation Treatment Dates: 08/03/2020 through 10/02/2020 Site Technique Total Dose (Gy) Dose per Fx (Gy) Completed Fx Beam Energies  Vulva: Pelvis IMRT 50.4/50.4 1.8 28/28 6X  Vulva: Pelvis_Bst IMRT 12.6/12.6 1.8 7/7 6X  Vulva: Pelvis_Bst_2 IMRT 10/10 2 5/5 6X      Cumulative dose to the vulvar region was 63.0 Gy.  Cumulative dose to the pelvis 50.4 Gy.  The inguinal areas were boosted to a cumulative dose of 60.4 Gy.     08/08/2020 PET scan   1. Intense activity midline along the perineum/external genitalia with differential of FDG urine contamination versus malignancy of the external genitalia. 2. Bilateral mildly enlarged intensely hypermetabolic inguinal lymph nodes. Biopsy clips adjacent to these lymph nodes. Findings concerning for metastatic  inguinal adenopathy(large benign-appearing fluid collection in the LEFT groin is presumably a post surgical seroma). 3. No evidence of metastatic pelvic iliac lymphadenopathy or retroperitoneal periaortic metastatic adenopathy. 4. No evidence of visceral metastasis. 5. No skeletal metastasis.     08/15/2020 Imaging   Vascular US RIGHT:  - No evidence of common femoral vein obstruction.     LEFT:  - There is no evidence of deep vein thrombosis in the lower extremity.     - No cystic structure found in the popliteal fossa.  - Ultrasound characteristics of several enlarged lymph nodes, and a large anechoic area, are noted in the groin.    11/03/2020 PET scan   1. No significant PET-CT findings for regression of tumor or adenopathy. 2. No new chest, abdominal or osseous metastatic disease.     11/08/2020 Pathology Results   A. VULVA, LEFT  POSTERIOR, BIOPSY:  - Invasive keratinizing squamous cell carcinoma.   11/13/2020 Cancer Staging   Staging form: Vulva, AJCC 8th Edition - Clinical stage from 11/13/2020: Stage IVA (cT2, cN3, cM0) - Signed by Heath Lark, MD on 11/13/2020 Stage prefix: Initial diagnosis    11/20/2020 Procedure   Placement of a subcutaneous power-injectable port device. Catheter tip in the lower SVC   11/22/2020 -  Chemotherapy   Patient is on Treatment Plan : Vulvar cancer Cisplatin + Paclitaxel q21d plus bevacizumab       PHYSICAL EXAMINATION: ECOG PERFORMANCE STATUS: 2 - Symptomatic, <50% confined to bed  Vitals:   12/12/20 0953  BP: (!) 152/89  Pulse: 95  Resp: 18  Temp: 97.6 F (36.4 C)  SpO2: 99%   Filed Weights   12/12/20 0953  Weight: 118 lb 9.6 oz (53.8 kg)    GENERAL:alert, no distress and comfortable SKIN: skin color, texture, turgor are normal, no rashes or significant lesions EYES: normal, Conjunctiva are pink and non-injected, sclera clear OROPHARYNX:no exudate, no erythema and lips, buccal mucosa, and tongue normal  NECK: supple,  thyroid normal size, non-tender, without nodularity LYMPH:  no palpable lymphadenopathy in the cervical, axillary or inguinal LUNGS: clear to auscultation and percussion with normal breathing effort HEART: regular rate & rhythm and no murmurs and no lower extremity edema ABDOMEN:abdomen soft, non-tender and normal bowel sounds Musculoskeletal:no cyanosis of digits and no clubbing  NEURO: alert & oriented x 3 with fluent speech, no focal motor/sensory deficits  LABORATORY DATA:  I have reviewed the data as listed    Component Value Date/Time   NA 135 12/12/2020 0922   NA 142 02/18/2015 0000   K 3.4 (L) 12/12/2020 0922   CL 95 (L) 12/12/2020 0922   CO2 28 12/12/2020 0922   GLUCOSE 122 (H) 12/12/2020 0922   BUN 13 12/12/2020 0922   BUN 13 02/18/2015 0000   CREATININE 0.72 12/12/2020 0922   CALCIUM 9.5 12/12/2020 0922   PROT 7.2 12/12/2020 0922   ALBUMIN 3.1 (L) 12/12/2020 0922   AST 22 12/12/2020 0922   ALT 24 12/12/2020 0922   ALKPHOS 73 12/12/2020 0922   BILITOT 0.4 12/12/2020 0922   GFRNONAA >60 12/12/2020 0922    No results found for: SPEP, UPEP  Lab Results  Component Value Date   WBC 17.3 (H) 12/12/2020   NEUTROABS 15.6 (H) 12/12/2020   HGB 10.6 (L) 12/12/2020   HCT 31.3 (L) 12/12/2020   MCV 88.2 12/12/2020   PLT 440 (H) 12/12/2020      Chemistry      Component Value Date/Time   NA 135 12/12/2020 0922   NA 142 02/18/2015 0000   K 3.4 (L) 12/12/2020 0922   CL 95 (L) 12/12/2020 0922   CO2 28 12/12/2020 0922   BUN 13 12/12/2020 0922   BUN 13 02/18/2015 0000   CREATININE 0.72 12/12/2020 0922   GLU 95 02/18/2015 0000      Component Value Date/Time   CALCIUM 9.5 12/12/2020 0922   ALKPHOS 73 12/12/2020 0922   AST 22 12/12/2020 0922   ALT 24 12/12/2020 0922   BILITOT 0.4 12/12/2020 0922       RADIOGRAPHIC STUDIES: I have personally reviewed the radiological images as listed and agreed with the findings in the report. IR IMAGING GUIDED PORT  INSERTION  Result Date: 11/15/2020 INDICATION: 60 year old with squamous cell carcinoma of the vulva. Port-A-Cath needed for chemotherapy. EXAM: FLUOROSCOPIC AND ULTRASOUND GUIDED PLACEMENT OF A SUBCUTANEOUS PORT COMPARISON:  None.  MEDICATIONS: Moderate sedation ANESTHESIA/SEDATION: Versed 3.0 mg IV; Fentanyl 125 mcg IV; Moderate Sedation Time:  23 minutes The patient was continuously monitored during the procedure by the interventional radiology nurse under my direct supervision. FLUOROSCOPY TIME:  36 seconds, 1 mGy COMPLICATIONS: None immediate. PROCEDURE: The procedure, risks, benefits, and alternatives were explained to the patient. Questions regarding the procedure were encouraged and answered. The patient understands and consents to the procedure. Patient was placed supine on the interventional table. Ultrasound confirmed a patent right internal jugular vein. Ultrasound image was saved for documentation. The right chest and neck were cleaned with a skin antiseptic and a sterile drape was placed. Maximal barrier sterile technique was utilized including caps, mask, sterile gowns, sterile gloves, sterile drape, hand hygiene and skin antiseptic. The right neck was anesthetized with 1% lidocaine. Small incision was made in the right neck with a blade. Micropuncture set was placed in the right internal jugular vein with ultrasound guidance. The micropuncture wire was used for measurement purposes. The right chest was anesthetized with 1% lidocaine with epinephrine. #15 blade was used to make an incision and a subcutaneous port pocket was formed. Thayne was assembled. Subcutaneous tunnel was formed with a stiff tunneling device. The port catheter was brought through the subcutaneous tunnel. The port was placed in the subcutaneous pocket and sutured in place. The micropuncture set was exchanged for a peel-away sheath. The catheter was placed through the peel-away sheath and the tip was positioned in the  lower SVC. Catheter placement was confirmed with fluoroscopy. The port was accessed and flushed with heparinized saline. The port pocket was closed using two layers of absorbable sutures and Dermabond. The vein skin site was closed using a single layer of absorbable suture and Dermabond. Sterile dressings were applied. Patient tolerated the procedure well without an immediate complication. Ultrasound and fluoroscopic images were taken and saved for this procedure. IMPRESSION: Placement of a subcutaneous power-injectable port device. Catheter tip in the lower SVC. Electronically Signed   By: Markus Daft M.D.   On: 11/15/2020 17:39

## 2020-12-12 NOTE — Assessment & Plan Note (Signed)
She has lost weight due to recent nausea and uncontrolled pain We discussed importance of frequent small meals She appears dehydrated I recommend daily IV fluid and she is in agreement

## 2020-12-12 NOTE — Assessment & Plan Note (Signed)
She has recurrent constipation likely secondary to changes in medications We discussed importance of regular laxative therapy

## 2020-12-12 NOTE — Assessment & Plan Note (Signed)
She has profound, uncontrolled pain again even though she had 1-1/2 weeks of good days after recent chemotherapy We will proceed with cycle 2 of treatment tomorrow along with aggressive supportive care I reminded the patient and family to give her daily dexamethasone starting day 4 of treatment for several days to improve her energy and appetite I will check on her again tomorrow for additional supportive care I will also schedule IV fluids for Thursday and Friday as well as next week I recommend minimum 3 cycles of chemotherapy before repeating imaging studies

## 2020-12-13 ENCOUNTER — Other Ambulatory Visit (HOSPITAL_COMMUNITY): Payer: Self-pay

## 2020-12-13 ENCOUNTER — Encounter: Payer: Self-pay | Admitting: Hematology and Oncology

## 2020-12-13 ENCOUNTER — Inpatient Hospital Stay: Payer: 59

## 2020-12-13 VITALS — BP 160/86 | HR 85 | Temp 98.2°F | Resp 18

## 2020-12-13 DIAGNOSIS — Z5111 Encounter for antineoplastic chemotherapy: Secondary | ICD-10-CM | POA: Diagnosis not present

## 2020-12-13 DIAGNOSIS — C519 Malignant neoplasm of vulva, unspecified: Secondary | ICD-10-CM

## 2020-12-13 MED ORDER — SODIUM CHLORIDE 0.9 % IV SOLN
40.0000 mg/m2 | Freq: Once | INTRAVENOUS | Status: AC
  Administered 2020-12-13: 61 mg via INTRAVENOUS
  Filled 2020-12-13: qty 61

## 2020-12-13 MED ORDER — MAGNESIUM SULFATE 2 GM/50ML IV SOLN
2.0000 g | Freq: Once | INTRAVENOUS | Status: AC
  Administered 2020-12-13: 2 g via INTRAVENOUS
  Filled 2020-12-13: qty 50

## 2020-12-13 MED ORDER — SODIUM CHLORIDE 0.9 % IV SOLN: INTRAVENOUS | Status: AC

## 2020-12-13 MED ORDER — HEPARIN SOD (PORK) LOCK FLUSH 100 UNIT/ML IV SOLN
500.0000 [IU] | Freq: Once | INTRAVENOUS | Status: AC | PRN
  Administered 2020-12-13: 500 [IU]

## 2020-12-13 MED ORDER — DIPHENHYDRAMINE HCL 50 MG/ML IJ SOLN
25.0000 mg | Freq: Once | INTRAMUSCULAR | Status: AC
  Administered 2020-12-13: 25 mg via INTRAVENOUS
  Filled 2020-12-13: qty 1

## 2020-12-13 MED ORDER — SODIUM CHLORIDE 0.9 % IV SOLN
175.0000 mg/m2 | Freq: Once | INTRAVENOUS | Status: AC
  Administered 2020-12-13: 264 mg via INTRAVENOUS
  Filled 2020-12-13: qty 44

## 2020-12-13 MED ORDER — SODIUM CHLORIDE 0.9 % IV SOLN
15.0000 mg/kg | Freq: Once | INTRAVENOUS | Status: AC
  Administered 2020-12-13: 800 mg via INTRAVENOUS
  Filled 2020-12-13: qty 32

## 2020-12-13 MED ORDER — FAMOTIDINE 20 MG IN NS 100 ML IVPB
20.0000 mg | Freq: Once | INTRAVENOUS | Status: AC
  Administered 2020-12-13: 20 mg via INTRAVENOUS
  Filled 2020-12-13: qty 100

## 2020-12-13 MED ORDER — SODIUM CHLORIDE 0.9 % IV SOLN
150.0000 mg | Freq: Once | INTRAVENOUS | Status: AC
  Administered 2020-12-13: 150 mg via INTRAVENOUS
  Filled 2020-12-13: qty 150

## 2020-12-13 MED ORDER — SODIUM CHLORIDE 0.9 % IV SOLN
10.0000 mg | Freq: Once | INTRAVENOUS | Status: AC
  Administered 2020-12-13: 10 mg via INTRAVENOUS
  Filled 2020-12-13: qty 10

## 2020-12-13 MED ORDER — SODIUM CHLORIDE 0.9% FLUSH
10.0000 mL | INTRAVENOUS | Status: DC | PRN
  Administered 2020-12-13: 10 mL

## 2020-12-13 MED ORDER — PALONOSETRON HCL INJECTION 0.25 MG/5ML
0.2500 mg | Freq: Once | INTRAVENOUS | Status: AC
  Administered 2020-12-13: 0.25 mg via INTRAVENOUS
  Filled 2020-12-13: qty 5

## 2020-12-14 ENCOUNTER — Inpatient Hospital Stay: Payer: 59

## 2020-12-14 ENCOUNTER — Other Ambulatory Visit: Payer: Self-pay

## 2020-12-14 VITALS — BP 158/81 | HR 69 | Temp 98.1°F | Resp 20

## 2020-12-14 DIAGNOSIS — Z5111 Encounter for antineoplastic chemotherapy: Secondary | ICD-10-CM | POA: Diagnosis not present

## 2020-12-14 DIAGNOSIS — C519 Malignant neoplasm of vulva, unspecified: Secondary | ICD-10-CM

## 2020-12-14 MED ORDER — SODIUM CHLORIDE 0.9 % IV SOLN: Freq: Once | INTRAVENOUS | Status: AC

## 2020-12-14 NOTE — Patient Instructions (Signed)

## 2020-12-15 ENCOUNTER — Ambulatory Visit: Payer: 59

## 2020-12-15 ENCOUNTER — Telehealth: Payer: Self-pay

## 2020-12-15 ENCOUNTER — Other Ambulatory Visit: Payer: Self-pay | Admitting: Internal Medicine

## 2020-12-15 ENCOUNTER — Inpatient Hospital Stay: Payer: 59 | Admitting: General Practice

## 2020-12-15 DIAGNOSIS — I10 Essential (primary) hypertension: Secondary | ICD-10-CM

## 2020-12-15 NOTE — Telephone Encounter (Signed)
Called to see how she is feeling. She is feeling better and canceled IV fluid appt for today. Reviewed upcoming appts. She verbalized understanding and requested to cancel 10 am appt today with [employer] officer, [employer]. Appt canceled.  Instructed to call the office back if needed. She verbalized understanding.

## 2020-12-18 ENCOUNTER — Inpatient Hospital Stay: Payer: 59

## 2020-12-18 ENCOUNTER — Encounter: Payer: Self-pay | Admitting: Hematology and Oncology

## 2020-12-18 ENCOUNTER — Inpatient Hospital Stay (HOSPITAL_BASED_OUTPATIENT_CLINIC_OR_DEPARTMENT_OTHER): Payer: 59 | Admitting: Hematology and Oncology

## 2020-12-18 ENCOUNTER — Other Ambulatory Visit: Payer: Self-pay

## 2020-12-18 ENCOUNTER — Other Ambulatory Visit: Payer: Self-pay | Admitting: Hematology and Oncology

## 2020-12-18 DIAGNOSIS — C519 Malignant neoplasm of vulva, unspecified: Secondary | ICD-10-CM

## 2020-12-18 DIAGNOSIS — G893 Neoplasm related pain (acute) (chronic): Secondary | ICD-10-CM

## 2020-12-18 DIAGNOSIS — Z5111 Encounter for antineoplastic chemotherapy: Secondary | ICD-10-CM | POA: Diagnosis not present

## 2020-12-18 DIAGNOSIS — R634 Abnormal weight loss: Secondary | ICD-10-CM

## 2020-12-18 MED ORDER — SODIUM CHLORIDE 0.9 % IV SOLN: Freq: Once | INTRAVENOUS | Status: AC

## 2020-12-18 NOTE — Assessment & Plan Note (Signed)
She continues to struggle with oral intake We discussed importance of getting her pain adequately controlled and frequent small meals Recommend minimum 3 cycles of treatment before repeating imaging studies We will continue aggressive supportive care on a weekly basis I plan to see her again at the end of the week for further follow-up

## 2020-12-18 NOTE — Progress Notes (Signed)
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[facility]) She continues to struggle with oral intake We discussed importance of getting her pain adequately controlled and frequent small meals Recommend minimum 3 cycles of treatment before repeating imaging studies We will continue aggressive supportive care on a weekly basis I plan to see her again at the end of the week for further follow-up  Cancer associated pain According to the patient, she has poorly controlled pain However, the documentation of oral pain medicine intake did not support that Over the last few days, on average, she only took 2 oxycodone for breakthrough pain We discussed importance of consistent documentation of pain medicine intake so that I can get a better accurate picture before I adjust her medicine  Weight loss, non-intentional I am very concerned about her progressive weight loss We discussed the importance of adequate nutritional intake for wound healing We discussed importance of frequent small meals I felt the IV fluids will help reduce her oral fluid intake so that she can focus on food intake I will schedule IV fluids today and Friday  No orders of the defined types were placed in this encounter.   All questions were answered. The patient knows to call the clinic with any problems, questions or concerns. The total time spent in the appointment was 20 minutes encounter with patients including review of chart and various tests results, discussions about plan of care and coordination of care plan   Heath Lark,  MD 12/18/2020 1:21 PM  INTERVAL HISTORY: Please see below for problem oriented charting. she returns for treatment follow-up with her husband The patient stated that she was in a lot of pain over the weekend but then slept a lot at the same time She only documented oxycodone breakthrough for Friday and Saturday  She has lost 5 more pounds since last week due to poor oral intake She denies constipation  REVIEW OF SYSTEMS:   Constitutional: Denies fevers, chills  Eyes: Denies blurriness of vision Ears, nose, mouth, throat, and face: Denies mucositis or sore throat Respiratory: Denies cough, dyspnea or wheezes Cardiovascular: Denies palpitation, chest discomfort or lower extremity swelling Gastrointestinal:  Denies nausea, heartburn or change in bowel habits Skin: Denies abnormal skin rashes Lymphatics: Denies new lymphadenopathy or easy bruising Neurological:Denies numbness, tingling or new weaknesses Behavioral/Psych: Mood is stable, no new changes  All other systems were reviewed with the patient and are negative.  I have reviewed the past medical history, past surgical history, social history and family history with the patient and they are unchanged from previous note.  ALLERGIES:  has No Known Allergies.  MEDICATIONS:  Current Outpatient Medications  Medication Sig Dispense Refill   bisacodyl (DULCOLAX) 5 MG EC tablet Take 5 mg by mouth daily.     cetirizine (ZYRTEC) 10 MG tablet TAKE 1 TO 2 TABLETS BY MOUTH DAILY AS NEEDED 60 tablet 6   Cholecalciferol (VITAMIN D) 50 MCG (2000 UT) tablet Take 2,000 Units by mouth daily.     dexamethasone (DECADRON) 4 MG tablet Take 1 tablet (4 mg total) by mouth daily. 30 tablet 3   Emollient (AQUAPHOR EX) Apply 1 application topically daily.  gabapentin (NEURONTIN) 300 MG capsule Take 1 capsule (300 mg total) by mouth at bedtime. 30 capsule 3   lactulose (CHRONULAC) 10 GM/15ML solution Take 30 mLs (20 g total) by mouth 3 (three) times  daily. 473 mL 3   lidocaine (XYLOCAINE) 5 % ointment Apply 1 application topically as needed for mild pain or moderate pain. 35.44 g 1   lidocaine-prilocaine (EMLA) cream Apply to affected area once 30 g 3   magnesium oxide (MAG-OX) 400 (240 Mg) MG tablet Take 1 tablet (400 mg total) by mouth daily. 30 tablet 1   metroNIDAZOLE (FLAGYL) 250 MG tablet Take 1 tablet (250 mg total) by mouth 2 (two) times daily. 20 tablet 0   morphine (MS CONTIN) 30 MG 12 hr tablet Take 1 tablet (30 mg total) by mouth at bedtime. 30 tablet 0   ondansetron (ZOFRAN) 8 MG tablet Take 1 tablet (8 mg total) by mouth every 8 (eight) hours as needed. Start on the third day after cisplatin chemotherapy. 30 tablet 1   oxybutynin (DITROPAN XL) 15 MG 24 hr tablet Take 15 mg by mouth daily.     oxycodone (ROXICODONE) 30 MG immediate release tablet Take 1 tablet (30 mg total) by mouth every 3 (three) hours as needed. 90 tablet 0   polyethylene glycol (MIRALAX / GLYCOLAX) 17 g packet Take 17 g by mouth daily.     Probiotic Product (PROBIOTIC PO) Take 1 capsule by mouth daily.     prochlorperazine (COMPAZINE) 10 MG tablet Take 1 tablet (10 mg total) by mouth every 6 (six) hours as needed (Nausea or vomiting). 30 tablet 1   No current facility-administered medications for this visit.   Facility-Administered Medications Ordered in Other Visits  Medication Dose Route Frequency Provider Last Rate Last Admin   0.9 %  sodium chloride infusion   Intravenous Once Alvy Bimler, Madesyn Ast, MD        SUMMARY OF ONCOLOGIC HISTORY: Oncology History Overview Note  PD-L1 CPS score is 15%   Squamous cell carcinoma of vulva ([place])  06/15/2020 Pathology Results   A. VULVA, BIOPSY:  -  Squamous cell carcinoma   06/16/2020 Initial Diagnosis   The patient began experiencing vulvar burning after her knee replacement in March 2022.  She mentioned this to her primary care physician who looked at it and was unclear about the diagnosis but recommended  consultation with a gynecologist.  The patient did not have an established gynecologist and therefore made a new patient consultation with Dr. Dellis Filbert.  Dr. Dellis Filbert saw the patient on 06/16/2020 which revealed a squamous cell carcinoma of the vulva.  The tumor was well differentiated and keratinizing.  Her primary vulvar cancer was not felt to be amenable to primary resection as it was a 6cm lesion replacing the perineal body and adherent with the anal sphincter.     06/30/2020 Imaging   Ct chest, abdomen and pelvis  1. No evidence of distant metastatic disease in the chest, abdomen or pelvis. 2. The patient's known cervical cancer is not well visualized. No gross bladder or anorectal involvement. No pelvic adenopathy. 3. Tiny right middle lobe nodule, likely benign. Attention on follow-up. 4.  Aortic Atherosclerosis (ICD10-I70.0).   07/03/2020 Initial Diagnosis   Squamous cell carcinoma of vulva ([place])   07/11/2020 Surgery   Surgery: bilateral inguinofemoral lymphadenectomy.   Surgeons:  Donaciano Eva, MD   Pathology: right and left inguinofemoral lymph nodes.   Operative findings: clinically suspicious medial left inguinal lymph node. 6cm perineal vulvar  tumor.     07/11/2020 Pathology Results   A. LYMPH NODE, RIGHT INGUINAL FEMORAL, RESECTION:  - Metastatic keratinizing squamous cell carcinoma involving one of three lymph nodes (1/3)  - No evidence of extranodal extension   B. LYMPH NODE, LEFT INGUINAL FEMORAL, RESECTION:  - Metastatic keratinizing squamous cell carcinoma involving two of four lymph nodes (2/4)  - No evidence of extranodal extension    08/03/2020 - 10/02/2020 Radiation Therapy   Radiation Treatment Dates: 08/03/2020 through 10/02/2020 Site Technique Total Dose (Gy) Dose per Fx (Gy) Completed Fx Beam Energies  Vulva: Pelvis IMRT 50.4/50.4 1.8 28/28 6X  Vulva: Pelvis_Bst IMRT 12.6/12.6 1.8 7/7 6X  Vulva: Pelvis_Bst_2 IMRT 10/10 2 5/5 6X      Cumulative dose to  the vulvar region was 63.0 Gy.  Cumulative dose to the pelvis 50.4 Gy.  The inguinal areas were boosted to a cumulative dose of 60.4 Gy.     08/08/2020 PET scan   1. Intense activity midline along the perineum/external genitalia with differential of FDG urine contamination versus malignancy of the external genitalia. 2. Bilateral mildly enlarged intensely hypermetabolic inguinal lymph nodes. Biopsy clips adjacent to these lymph nodes. Findings concerning for metastatic inguinal adenopathy(large benign-appearing fluid collection in the LEFT groin is presumably a post surgical seroma). 3. No evidence of metastatic pelvic iliac lymphadenopathy or retroperitoneal periaortic metastatic adenopathy. 4. No evidence of visceral metastasis. 5. No skeletal metastasis.     08/15/2020 Imaging   Vascular US RIGHT:  - No evidence of common femoral vein obstruction.     LEFT:  - There is no evidence of deep vein thrombosis in the lower extremity.     - No cystic structure found in the popliteal fossa.  - Ultrasound characteristics of several enlarged lymph nodes, and a large anechoic area, are noted in the groin.    11/03/2020 PET scan   1. No significant PET-CT findings for regression of tumor or adenopathy. 2. No new chest, abdominal or osseous metastatic disease.     11/08/2020 Pathology Results   A. VULVA, LEFT POSTERIOR, BIOPSY:  - Invasive keratinizing squamous cell carcinoma.   11/13/2020 Cancer Staging   Staging form: Vulva, AJCC 8th Edition - Clinical stage from 11/13/2020: Stage IVA (cT2, cN3, cM0) - Signed by Heath Lark, MD on 11/13/2020 Stage prefix: Initial diagnosis    11/20/2020 Procedure   Placement of a subcutaneous power-injectable port device. Catheter tip in the lower SVC   11/22/2020 -  Chemotherapy   Patient is on Treatment Plan : Vulvar cancer Cisplatin + Paclitaxel q21d plus bevacizumab       PHYSICAL EXAMINATION: ECOG PERFORMANCE STATUS: 1 - Symptomatic but completely  ambulatory  Vitals:   12/18/20 1213  BP: 125/73  Pulse: 98  Resp: 18  Temp: (!) 96.4 F (35.8 C)  SpO2: 98%   Filed Weights   12/18/20 1213  Weight: 114 lb (51.7 kg)    GENERAL:alert, no distress and comfortable Musculoskeletal:no cyanosis of digits and no clubbing  NEURO: alert & oriented x 3 with fluent speech, no focal motor/sensory deficits  LABORATORY DATA:  I have reviewed the data as listed    Component Value Date/Time   NA 135 12/12/2020 0922   NA 142 02/18/2015 0000   K 3.4 (L) 12/12/2020 0922   CL 95 (L) 12/12/2020 0922   CO2 28 12/12/2020 0922   GLUCOSE 122 (H) 12/12/2020 0922   BUN 13 12/12/2020 0922   BUN 13 02/18/2015 0000   CREATININE 0.72 12/12/2020  1287   CALCIUM 9.5 12/12/2020 0922   PROT 7.2 12/12/2020 0922   ALBUMIN 3.1 (L) 12/12/2020 0922   AST 22 12/12/2020 0922   ALT 24 12/12/2020 0922   ALKPHOS 73 12/12/2020 0922   BILITOT 0.4 12/12/2020 0922   GFRNONAA >60 12/12/2020 0922    No results found for: SPEP, UPEP  Lab Results  Component Value Date   WBC 17.3 (H) 12/12/2020   NEUTROABS 15.6 (H) 12/12/2020   HGB 10.6 (L) 12/12/2020   HCT 31.3 (L) 12/12/2020   MCV 88.2 12/12/2020   PLT 440 (H) 12/12/2020      Chemistry      Component Value Date/Time   NA 135 12/12/2020 0922   NA 142 02/18/2015 0000   K 3.4 (L) 12/12/2020 0922   CL 95 (L) 12/12/2020 0922   CO2 28 12/12/2020 0922   BUN 13 12/12/2020 0922   BUN 13 02/18/2015 0000   CREATININE 0.72 12/12/2020 0922   GLU 95 02/18/2015 0000      Component Value Date/Time   CALCIUM 9.5 12/12/2020 0922   ALKPHOS 73 12/12/2020 0922   AST 22 12/12/2020 0922   ALT 24 12/12/2020 0922   BILITOT 0.4 12/12/2020 8676

## 2020-12-18 NOTE — Assessment & Plan Note (Signed)
According to the patient, she has poorly controlled pain However, the documentation of oral pain medicine intake did not support that Over the last few days, on average, she only took 2 oxycodone for breakthrough pain We discussed importance of consistent documentation of pain medicine intake so that I can get a better accurate picture before I adjust her medicine

## 2020-12-18 NOTE — Assessment & Plan Note (Signed)
I am very concerned about her progressive weight loss We discussed the importance of adequate nutritional intake for wound healing We discussed importance of frequent small meals I felt the IV fluids will help reduce her oral fluid intake so that she can focus on food intake I will schedule IV fluids today and Friday

## 2020-12-19 ENCOUNTER — Encounter: Payer: Self-pay | Admitting: Hematology and Oncology

## 2020-12-21 ENCOUNTER — Telehealth: Payer: Self-pay | Admitting: Oncology

## 2020-12-21 ENCOUNTER — Other Ambulatory Visit (HOSPITAL_COMMUNITY): Payer: Self-pay

## 2020-12-21 ENCOUNTER — Other Ambulatory Visit: Payer: Self-pay | Admitting: Hematology and Oncology

## 2020-12-21 ENCOUNTER — Encounter: Payer: Self-pay | Admitting: Hematology and Oncology

## 2020-12-21 MED ORDER — MORPHINE SULFATE ER 30 MG PO TBCR
30.0000 mg | EXTENDED_RELEASE_TABLET | Freq: Every day | ORAL | 0 refills | Status: DC
  Filled 2020-12-21: qty 30, 30d supply, fill #0

## 2020-12-21 NOTE — Telephone Encounter (Signed)
Called Christina [name] and explained that 30 tablets of the Morphine will be covered by her insurance.  If she wants 60 tablets, she would need to pay $45.75 out of pocket.  She said she would like the 30 tablets.  Also asked how she doing.  She said she is feeling much better and is on a "good track."  She has more of an understanding on how to take her pain medication and is less worried about constipation.  Advised her to call if she needs anything.

## 2020-12-21 NOTE — Telephone Encounter (Signed)
Christina [name] called and requested a refill on morphine 30 mg tablets (has 4 left). She would like it sent to [facility].

## 2020-12-21 NOTE — Telephone Encounter (Signed)
Done Please ask also how she is doing

## 2020-12-22 ENCOUNTER — Inpatient Hospital Stay: Payer: 59 | Attending: Gynecologic Oncology

## 2020-12-22 ENCOUNTER — Encounter: Payer: Self-pay | Admitting: Oncology

## 2020-12-22 ENCOUNTER — Inpatient Hospital Stay (HOSPITAL_BASED_OUTPATIENT_CLINIC_OR_DEPARTMENT_OTHER): Payer: 59 | Admitting: Hematology and Oncology

## 2020-12-22 ENCOUNTER — Other Ambulatory Visit: Payer: Self-pay

## 2020-12-22 ENCOUNTER — Encounter: Payer: Self-pay | Admitting: Hematology and Oncology

## 2020-12-22 ENCOUNTER — Telehealth: Payer: Self-pay | Admitting: Physical Medicine and Rehabilitation

## 2020-12-22 VITALS — BP 148/66 | HR 89 | Temp 98.6°F | Resp 16

## 2020-12-22 DIAGNOSIS — Z923 Personal history of irradiation: Secondary | ICD-10-CM | POA: Insufficient documentation

## 2020-12-22 DIAGNOSIS — E785 Hyperlipidemia, unspecified: Secondary | ICD-10-CM | POA: Diagnosis not present

## 2020-12-22 DIAGNOSIS — Z5111 Encounter for antineoplastic chemotherapy: Secondary | ICD-10-CM | POA: Diagnosis present

## 2020-12-22 DIAGNOSIS — Z79891 Long term (current) use of opiate analgesic: Secondary | ICD-10-CM | POA: Diagnosis not present

## 2020-12-22 DIAGNOSIS — Z79899 Other long term (current) drug therapy: Secondary | ICD-10-CM | POA: Diagnosis not present

## 2020-12-22 DIAGNOSIS — Z66 Do not resuscitate: Secondary | ICD-10-CM | POA: Insufficient documentation

## 2020-12-22 DIAGNOSIS — R634 Abnormal weight loss: Secondary | ICD-10-CM | POA: Insufficient documentation

## 2020-12-22 DIAGNOSIS — G893 Neoplasm related pain (acute) (chronic): Secondary | ICD-10-CM | POA: Insufficient documentation

## 2020-12-22 DIAGNOSIS — C519 Malignant neoplasm of vulva, unspecified: Secondary | ICD-10-CM

## 2020-12-22 DIAGNOSIS — R531 Weakness: Secondary | ICD-10-CM | POA: Diagnosis not present

## 2020-12-22 DIAGNOSIS — M545 Low back pain, unspecified: Secondary | ICD-10-CM | POA: Diagnosis not present

## 2020-12-22 DIAGNOSIS — Z7952 Long term (current) use of systemic steroids: Secondary | ICD-10-CM | POA: Diagnosis not present

## 2020-12-22 DIAGNOSIS — E860 Dehydration: Secondary | ICD-10-CM | POA: Insufficient documentation

## 2020-12-22 DIAGNOSIS — R309 Painful micturition, unspecified: Secondary | ICD-10-CM | POA: Insufficient documentation

## 2020-12-22 DIAGNOSIS — I10 Essential (primary) hypertension: Secondary | ICD-10-CM | POA: Diagnosis not present

## 2020-12-22 DIAGNOSIS — K5909 Other constipation: Secondary | ICD-10-CM | POA: Diagnosis not present

## 2020-12-22 MED ORDER — SODIUM CHLORIDE 0.9% FLUSH
10.0000 mL | Freq: Once | INTRAVENOUS | Status: AC | PRN
  Administered 2020-12-22: 10 mL

## 2020-12-22 MED ORDER — HEPARIN SOD (PORK) LOCK FLUSH 100 UNIT/ML IV SOLN
500.0000 [IU] | Freq: Once | INTRAVENOUS | Status: AC | PRN
  Administered 2020-12-22: 500 [IU]

## 2020-12-22 MED ORDER — SODIUM CHLORIDE 0.9 % IV SOLN: Freq: Once | INTRAVENOUS | Status: AC

## 2020-12-22 NOTE — Progress Notes (Signed)
Pt tolerated IVF infusion without difficulty.  Dr. Alvy Bimler saw pt during infusion.  Pt was stable at discharge via self ambulation.

## 2020-12-22 NOTE — Assessment & Plan Note (Signed)
According to the patient, her weight loss is stable We discussed importance of frequent small meals

## 2020-12-22 NOTE — Assessment & Plan Note (Signed)
She has excellent pain control She is getting somewhat sedated from her long-acting morphine sulfate I recommend consideration to stop using the nighttime MS Contin and see how she does with breakthrough oxycodone only

## 2020-12-22 NOTE — Patient Instructions (Signed)

## 2020-12-22 NOTE — Progress Notes (Signed)
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[place]) Clinically, I believe she is improving She looks strong today We will proceed with IV fluid support I recommend even consideration to not take her nighttime MS Contin and see how she does with oxycodone as needed for pain management I will see her again next week for further follow-up  Weight loss, non-intentional According to the patient, her weight loss is stable We discussed importance of frequent small meals  Cancer associated pain She has excellent pain control She is getting somewhat sedated from her long-acting morphine sulfate I recommend consideration to stop using the nighttime MS Contin and see how she does with breakthrough oxycodone only  No orders of the defined types were placed in this encounter.   All questions were answered. The patient knows to call the clinic with any problems, questions or concerns. The total time spent in the appointment was 20 minutes encounter with patients including review of chart and various tests results, discussions about plan of care and coordination of care plan   Heath Lark, MD 12/22/2020 9:45 AM  INTERVAL HISTORY: Please see below for problem oriented charting. she returns for treatment follow-up and she is seen in the infusion room She looks great and feels great Her pain is well controlled She is attempting to eat whenever she can and her documented weight at home is around 118 pounds She denies nausea or constipation She is physically active and started raking leaves in her garden She  sleeps many hours daily  REVIEW OF SYSTEMS:   Constitutional: Denies fevers, chills or abnormal weight loss Eyes: Denies blurriness of vision Ears, nose, mouth, throat, and face: Denies mucositis or sore throat Respiratory: Denies cough, dyspnea or wheezes Cardiovascular: Denies palpitation, chest discomfort or lower extremity swelling Gastrointestinal:  Denies nausea, heartburn or change in bowel habits Skin: Denies abnormal skin rashes Lymphatics: Denies new lymphadenopathy or easy bruising Neurological:Denies numbness, tingling or new weaknesses Behavioral/Psych: Mood is stable, no new changes  All other systems were reviewed with the patient and are negative.  I have reviewed the past medical history, past surgical history, social history and family history with the patient and they are unchanged from previous note.  ALLERGIES:  has No Known Allergies.  MEDICATIONS:  Current Outpatient Medications  Medication Sig Dispense Refill   bisacodyl (DULCOLAX) 5 MG EC tablet Take 5 mg by mouth daily.     cetirizine (ZYRTEC) 10 MG tablet TAKE 1 TO 2 TABLETS BY MOUTH DAILY AS NEEDED 60 tablet 6   Cholecalciferol (VITAMIN D) 50 MCG (2000 UT) tablet Take 2,000 Units by mouth daily.     dexamethasone (DECADRON) 4 MG tablet Take 1 tablet (4 mg total) by mouth daily. 30 tablet 3   Emollient (AQUAPHOR EX) Apply 1 application topically daily.     gabapentin (NEURONTIN) 300 MG capsule Take 1 capsule (300 mg total) by mouth at bedtime. 30 capsule 3   lactulose (CHRONULAC) 10 GM/15ML solution Take 30 mLs (20 g total) by mouth 3 (three) times daily. 473 mL 3   lidocaine (XYLOCAINE) 5 % ointment Apply 1 application topically as needed for mild pain or moderate pain.  35.44 g 1   lidocaine-prilocaine (EMLA) cream Apply to affected area once 30 g 3   magnesium oxide (MAG-OX) 400 (240 Mg) MG tablet Take 1 tablet (400 mg total) by mouth daily. 30 tablet 1   metroNIDAZOLE (FLAGYL) 250 MG tablet Take 1 tablet  (250 mg total) by mouth 2 (two) times daily. 20 tablet 0   morphine (MS CONTIN) 30 MG 12 hr tablet Take 1 tablet (30 mg total) by mouth at bedtime. 60 tablet 0   ondansetron (ZOFRAN) 8 MG tablet Take 1 tablet (8 mg total) by mouth every 8 (eight) hours as needed. Start on the third day after cisplatin chemotherapy. 30 tablet 1   oxybutynin (DITROPAN XL) 15 MG 24 hr tablet Take 15 mg by mouth daily.     oxycodone (ROXICODONE) 30 MG immediate release tablet Take 1 tablet (30 mg total) by mouth every 3 (three) hours as needed. 90 tablet 0   polyethylene glycol (MIRALAX / GLYCOLAX) 17 g packet Take 17 g by mouth daily.     Probiotic Product (PROBIOTIC PO) Take 1 capsule by mouth daily.     prochlorperazine (COMPAZINE) 10 MG tablet Take 1 tablet (10 mg total) by mouth every 6 (six) hours as needed (Nausea or vomiting). 30 tablet 1   No current facility-administered medications for this visit.   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 VULVA, BIOPSY:  -  Squamous cell carcinoma   06/16/2020 Initial Diagnosis   The patient began experiencing vulvar burning after her knee replacement in March 2022.  She mentioned this to her primary care physician who looked at it and was unclear about the diagnosis but recommended consultation with a gynecologist.  The patient did not have an established gynecologist and therefore made a new patient consultation with Dr. Dellis Filbert.  Dr. Dellis Filbert saw the patient on 06/16/2020 which revealed a squamous cell carcinoma of the vulva.  The tumor was  well differentiated and keratinizing.  Her primary vulvar cancer was not felt to be amenable to primary resection as it was a 6cm lesion replacing the perineal body and adherent with the anal sphincter.     06/30/2020 Imaging   Ct chest, abdomen and pelvis  1. No evidence of distant metastatic disease in the chest, abdomen or pelvis. 2. The patient's known cervical cancer is not well visualized. No gross bladder or anorectal involvement. No pelvic adenopathy. 3. Tiny right middle lobe nodule, likely benign. Attention on follow-up. 4.  Aortic Atherosclerosis (ICD10-I70.0).   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   Surgeons:  Donaciano Eva, MD   Pathology: right and left inguinofemoral lymph nodes.   Operative findings: clinically suspicious medial left inguinal lymph node. 6cm perineal vulvar tumor.     07/11/2020 Pathology Results   A. LYMPH NODE, RIGHT INGUINAL FEMORAL, RESECTION:  - Metastatic keratinizing squamous cell carcinoma involving one of three lymph nodes (1/3)  - No  evidence of extranodal extension   B. LYMPH NODE, LEFT INGUINAL FEMORAL, RESECTION:  - Metastatic keratinizing squamous cell carcinoma involving two of four lymph nodes (2/4)  - No evidence of extranodal extension    08/03/2020 - 10/02/2020 Radiation Therapy   Radiation Treatment Dates: 08/03/2020 through 10/02/2020 Site Technique Total Dose (Gy) Dose per Fx (Gy) Completed Fx Beam Energies  Vulva: Pelvis IMRT 50.4/50.4 1.8 28/28 6X  Vulva: Pelvis_Bst IMRT 12.6/12.6 1.8 7/7 6X  Vulva: Pelvis_Bst_2 IMRT 10/10 2 5/5 6X      Cumulative dose to the vulvar region was 63.0 Gy.  Cumulative dose to the pelvis 50.4 Gy.  The inguinal areas were boosted to a cumulative dose of 60.4 Gy.     08/08/2020 PET scan   1. Intense activity midline along the perineum/external genitalia with differential of FDG urine  contamination versus malignancy of the external genitalia. 2. Bilateral mildly enlarged intensely hypermetabolic inguinal lymph nodes. Biopsy clips adjacent to these lymph nodes. Findings concerning for metastatic inguinal adenopathy(large benign-appearing fluid collection in the LEFT groin is presumably a post surgical seroma). 3. No evidence of metastatic pelvic iliac lymphadenopathy or retroperitoneal periaortic metastatic adenopathy. 4. No evidence of visceral metastasis. 5. No skeletal metastasis.     08/15/2020 Imaging   Vascular US RIGHT:  - No evidence of common femoral vein obstruction.     LEFT:  - There is no evidence of deep vein thrombosis in the lower extremity.     - No cystic structure found in the popliteal fossa.  - Ultrasound characteristics of several enlarged lymph nodes, and a large anechoic area, are noted in the groin.    11/03/2020 PET scan   1. No significant PET-CT findings for regression of tumor or adenopathy. 2. No new chest, abdominal or osseous metastatic disease.     11/08/2020 Pathology Results   A. VULVA, LEFT POSTERIOR, BIOPSY:  - Invasive keratinizing squamous cell carcinoma.   11/13/2020 Cancer Staging   Staging form: Vulva, AJCC 8th Edition - Clinical stage from 11/13/2020: Stage IVA (cT2, cN3, cM0) - Signed by Heath Lark, MD on 11/13/2020 Stage prefix: Initial diagnosis    11/20/2020 Procedure   Placement of a subcutaneous power-injectable port device. Catheter tip in the lower SVC   11/22/2020 -  Chemotherapy   Patient is on Treatment Plan : Vulvar cancer Cisplatin + Paclitaxel q21d plus bevacizumab       PHYSICAL EXAMINATION: ECOG PERFORMANCE STATUS: 1 - Symptomatic but completely ambulatory GENERAL:alert, no distress and comfortable NEURO: alert & oriented x 3 with fluent speech, no focal motor/sensory deficits  LABORATORY DATA:  I have reviewed the data as listed    Component Value Date/Time   NA 135 12/12/2020 0922   NA 142  02/18/2015 0000   K 3.4 (L) 12/12/2020 0922   CL 95 (L) 12/12/2020 0922   CO2 28 12/12/2020 0922   GLUCOSE 122 (H) 12/12/2020 0922   BUN 13 12/12/2020 0922   BUN 13 02/18/2015 0000   CREATININE 0.72 12/12/2020 0922   CALCIUM 9.5 12/12/2020 0922   PROT 7.2 12/12/2020 0922   ALBUMIN 3.1 (L) 12/12/2020 0922   AST 22 12/12/2020 0922   ALT 24 12/12/2020 0922   ALKPHOS 73 12/12/2020 0922   BILITOT 0.4 12/12/2020 0922   GFRNONAA >60 12/12/2020 0922    No results found for: SPEP, UPEP  Lab Results  Component Value Date   WBC 17.3 (H) 12/12/2020   NEUTROABS 15.6 (H) 12/12/2020   HGB 10.6 (L) 12/12/2020  HCT 31.3 (L) 12/12/2020   MCV 88.2 12/12/2020   PLT 440 (H) 12/12/2020      Chemistry      Component Value Date/Time   NA 135 12/12/2020 0922   NA 142 02/18/2015 0000   K 3.4 (L) 12/12/2020 0922   CL 95 (L) 12/12/2020 0922   CO2 28 12/12/2020 0922   BUN 13 12/12/2020 0922   BUN 13 02/18/2015 0000   CREATININE 0.72 12/12/2020 0922   GLU 95 02/18/2015 0000      Component Value Date/Time   CALCIUM 9.5 12/12/2020 0922   ALKPHOS 73 12/12/2020 0922   AST 22 12/12/2020 0922   ALT 24 12/12/2020 0922   BILITOT 0.4 12/12/2020 4239

## 2020-12-22 NOTE — Assessment & Plan Note (Signed)
Clinically, I believe she is improving She looks strong today We will proceed with IV fluid support I recommend even consideration to not take her nighttime MS Contin and see how she does with oxycodone as needed for pain management I will see her again next week for further follow-up

## 2020-12-22 NOTE — Telephone Encounter (Signed)
Pt called requesting a call to set an appt. Please call pt at [phone].

## 2020-12-22 NOTE — Progress Notes (Signed)
Notified Christina [name] of appointment with Dr. Berline Lopes on 12/25/20 at 3:00.  She verbalized understanding and agreement.

## 2020-12-25 ENCOUNTER — Other Ambulatory Visit: Payer: Self-pay

## 2020-12-25 ENCOUNTER — Encounter: Payer: Self-pay | Admitting: Gynecologic Oncology

## 2020-12-25 ENCOUNTER — Inpatient Hospital Stay (HOSPITAL_BASED_OUTPATIENT_CLINIC_OR_DEPARTMENT_OTHER): Payer: 59 | Admitting: Gynecologic Oncology

## 2020-12-25 VITALS — BP 149/86 | HR 84 | Temp 98.3°F | Resp 16 | Ht 61.0 in | Wt 116.0 lb

## 2020-12-25 DIAGNOSIS — Z5111 Encounter for antineoplastic chemotherapy: Secondary | ICD-10-CM | POA: Diagnosis not present

## 2020-12-25 DIAGNOSIS — C519 Malignant neoplasm of vulva, unspecified: Secondary | ICD-10-CM | POA: Diagnosis not present

## 2020-12-25 NOTE — Progress Notes (Signed)
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 VULVA, BIOPSY:  -  Squamous cell carcinoma   06/16/2020 Initial Diagnosis   The patient began experiencing vulvar burning after her knee replacement in March 2022.  She mentioned this to her primary care physician who looked at it and was unclear about the diagnosis but recommended consultation with a gynecologist.  The patient did not have an established gynecologist and therefore made a new patient consultation with Dr. Dellis Filbert.  Dr. Dellis Filbert saw the patient on 06/16/2020 which revealed a squamous cell carcinoma of the vulva.  The tumor was well differentiated and keratinizing.  Her primary vulvar cancer was not felt to be amenable to primary resection as it was a 6cm lesion replacing the perineal body and adherent with the anal sphincter.     06/30/2020 Imaging   Ct chest, abdomen and pelvis  1. No evidence of distant metastatic disease in the chest, abdomen or pelvis. 2. The patient's known cervical cancer is not well visualized. No gross bladder or anorectal involvement. No pelvic adenopathy. 3. Tiny right middle lobe nodule, likely benign. Attention on follow-up. 4.  Aortic Atherosclerosis (ICD10-I70.0).   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   Surgeons:  Donaciano Eva, MD   Pathology: right and left inguinofemoral lymph nodes.   Operative findings: clinically suspicious medial left inguinal lymph node. 6cm perineal vulvar tumor.     07/11/2020 Pathology Results   A. LYMPH NODE, RIGHT INGUINAL FEMORAL, RESECTION:  - Metastatic keratinizing squamous cell carcinoma involving one of three lymph nodes (1/3)  - No evidence  of extranodal extension   B. LYMPH NODE, LEFT INGUINAL FEMORAL, RESECTION:  - Metastatic keratinizing squamous cell carcinoma involving two of four lymph nodes (2/4)  - No evidence of extranodal extension    08/03/2020 - 10/02/2020 Radiation Therapy   Radiation Treatment Dates: 08/03/2020 through 10/02/2020 Site Technique Total Dose (Gy) Dose per Fx (Gy) Completed Fx Beam Energies  Vulva: Pelvis IMRT 50.4/50.4 1.8 28/28 6X  Vulva: Pelvis_Bst IMRT 12.6/12.6 1.8 7/7 6X  Vulva: Pelvis_Bst_2 IMRT 10/10 2 5/5 6X      Cumulative dose to the vulvar region was 63.0 Gy.  Cumulative dose to the pelvis 50.4 Gy.  The inguinal areas were boosted to a cumulative dose of 60.4 Gy.     08/08/2020 PET scan   1. Intense activity midline along the perineum/external genitalia with differential of FDG urine contamination versus malignancy of the external genitalia. 2. Bilateral mildly enlarged intensely hypermetabolic inguinal lymph nodes. Biopsy clips adjacent to these lymph nodes. Findings concerning for metastatic inguinal adenopathy(large benign-appearing fluid collection in the LEFT groin is presumably a post surgical seroma). 3. No evidence of metastatic pelvic iliac lymphadenopathy or retroperitoneal periaortic metastatic adenopathy. 4. No evidence of visceral metastasis. 5. No skeletal metastasis.     08/15/2020 Imaging   Vascular US RIGHT:  - No evidence of common femoral vein obstruction.     LEFT:  - There is no evidence of deep vein thrombosis in the lower extremity.     - No cystic structure found in the popliteal fossa.  - Ultrasound characteristics of several enlarged lymph nodes, and a large anechoic area, are noted in the groin.  11/03/2020 PET scan   1. No significant PET-CT findings for regression of tumor or adenopathy. 2. No new chest, abdominal or osseous metastatic disease.     11/08/2020 Pathology Results   A. VULVA, LEFT POSTERIOR, BIOPSY:  - Invasive keratinizing squamous  cell carcinoma.   11/13/2020 Cancer Staging   Staging form: Vulva, AJCC 8th Edition - Clinical stage from 11/13/2020: Stage IVA (cT2, cN3, cM0) - Signed by Heath Lark, MD on 11/13/2020 Stage prefix: Initial diagnosis    11/20/2020 Procedure   Placement of a subcutaneous power-injectable port device. Catheter tip in the lower SVC   11/22/2020 -  Chemotherapy   Patient is on Treatment Plan : Vulvar cancer Cisplatin + Paclitaxel q21d plus bevacizumab       Interval History: Patient presents today for discussion about surgery.  She is status post 2 cycles of systemic therapy in the setting of recurrent disease.  She notes that her vulvar pain is significantly better controlled now with Aloxi and morphine.  She continues to have ongoing bothersome vulvar discharge requiring her to change her underpants 6-7 times a day.  She denies any bleeding.  Her appetite has improved and on her home scale she has been gaining weight.  She is continuing to eat smaller more frequent snacks and meals.  She reports regular bowel function as well as bladder function.  She saw Dr. Rhodia Albright at [facility] last week.  Past Medical/Surgical History: Past Medical History:  Diagnosis Date   Arthritis    Chicken pox    Chronic back pain    History of radiation therapy    Pelvis 08/03/20-10/02/20- Dr. Gery Pray   HLD (hyperlipidemia)    diet controlled   Hypertension    Recurrent upper respiratory infection (URI)    Seasonal allergies    Vulvar cancer [facility])     Past Surgical History:  Procedure Laterality Date   BLADDER SUSPENSION     CHOLECYSTECTOMY     COLONOSCOPY     INGUINAL LYMPHADENECTOMY Bilateral 07/11/2020   Procedure: INGUINAL LYMPHADENECTOMY;  Surgeon: Everitt Amber, MD;  Location: [facility];  Service: Gynecology;  Laterality: Bilateral;   IR IMAGING GUIDED PORT INSERTION  11/15/2020   TOTAL KNEE ARTHROPLASTY Right 04/25/2020   Procedure: RIGHT TOTAL KNEE ARTHROPLASTY;  Surgeon: Meredith Pel,  MD;  Location: [facility];  Service: Orthopedics;  Laterality: Right;   WISDOM TOOTH EXTRACTION      Family History  Problem Relation Age of Onset   Hyperlipidemia Mother    Hypertension Mother    Stroke Mother    Breast cancer Mother    Prostate cancer Father    Hypertension Maternal Grandmother    Hypertension Paternal Grandmother    Stroke Paternal Grandmother    Allergic rhinitis Neg Hx    Asthma Neg Hx    Ovarian cancer Neg Hx    Uterine cancer Neg Hx    Colon cancer Neg Hx    Pancreatic cancer Neg Hx     Social History   Socioeconomic History   Marital status: Married    Spouse name: Not on file   Number of children: Not on file   Years of education: Not on file   Highest education level: Not on file  Occupational History   Occupation: retired  Tobacco Use   Smoking status: Former    Packs/day: 0.50    Years: 2.00    Pack years: 1.00    Types: Cigarettes   Smokeless tobacco: Never   Tobacco  comments:    Smoke in 20's  Vaping Use   Vaping Use: Never used  Substance and Sexual Activity   Alcohol use: Not Currently   Drug use: No   Sexual activity: Yes    Partners: Male    Birth control/protection: Post-menopausal  Other Topics Concern   Not on file  Social History Narrative   Not on file   Social Determinants of Health   Financial Resource Strain: Not on file  Food Insecurity: Not on file  Transportation Needs: Not on file  Physical Activity: Not on file  Stress: Not on file  Social Connections: Not on file    Current Medications:  Current Outpatient Medications:    bisacodyl (DULCOLAX) 5 MG EC tablet, Take 5 mg by mouth daily., Disp: , Rfl:    cetirizine (ZYRTEC) 10 MG tablet, TAKE 1 TO 2 TABLETS BY MOUTH DAILY AS NEEDED, Disp: 60 tablet, Rfl: 6   Cholecalciferol (VITAMIN D) 50 MCG (2000 UT) tablet, Take 2,000 Units by mouth daily., Disp: , Rfl:    dexamethasone (DECADRON) 4 MG tablet, Take 1 tablet (4 mg total) by mouth daily., Disp: 30 tablet,  Rfl: 3   Emollient (AQUAPHOR EX), Apply 1 application topically daily., Disp: , Rfl:    gabapentin (NEURONTIN) 300 MG capsule, Take 1 capsule (300 mg total) by mouth at bedtime., Disp: 30 capsule, Rfl: 3   lactulose (CHRONULAC) 10 GM/15ML solution, Take 30 mLs (20 g total) by mouth 3 (three) times daily., Disp: 473 mL, Rfl: 3   lidocaine (XYLOCAINE) 5 % ointment, Apply 1 application topically as needed for mild pain or moderate pain., Disp: 35.44 g, Rfl: 1   lidocaine-prilocaine (EMLA) cream, Apply to affected area once, Disp: 30 g, Rfl: 3   magnesium oxide (MAG-OX) 400 (240 Mg) MG tablet, Take 1 tablet (400 mg total) by mouth daily., Disp: 30 tablet, Rfl: 1   metroNIDAZOLE (FLAGYL) 250 MG tablet, Take 1 tablet (250 mg total) by mouth 2 (two) times daily., Disp: 20 tablet, Rfl: 0   morphine (MS CONTIN) 30 MG 12 hr tablet, Take 1 tablet (30 mg total) by mouth at bedtime., Disp: 60 tablet, Rfl: 0   ondansetron (ZOFRAN) 8 MG tablet, Take 1 tablet (8 mg total) by mouth every 8 (eight) hours as needed. Start on the third day after cisplatin chemotherapy., Disp: 30 tablet, Rfl: 1   oxybutynin (DITROPAN XL) 15 MG 24 hr tablet, Take 15 mg by mouth daily., Disp: , Rfl:    oxycodone (ROXICODONE) 30 MG immediate release tablet, Take 1 tablet (30 mg total) by mouth every 3 (three) hours as needed., Disp: 90 tablet, Rfl: 0   polyethylene glycol (MIRALAX / GLYCOLAX) 17 g packet, Take 17 g by mouth daily., Disp: , Rfl:    Probiotic Product (PROBIOTIC PO), Take 1 capsule by mouth daily., Disp: , Rfl:    prochlorperazine (COMPAZINE) 10 MG tablet, Take 1 tablet (10 mg total) by mouth every 6 (six) hours as needed (Nausea or vomiting)., Disp: 30 tablet, Rfl: 1  Review of Systems: Pertinent positives as per HPI Denies chills, fatigue, unexplained weight changes. Denies hearing loss, neck lumps or masses, mouth sores, ringing in ears or voice changes. Denies cough or wheezing.  Denies shortness of breath. Denies  chest pain or palpitations. Denies leg swelling. Denies abdominal distention, pain, blood in stools, constipation, diarrhea, nausea, vomiting, or early satiety. Denies pain with intercourse, dysuria, frequency, hematuria or incontinence. Denies joint pain, back pain or muscle pain/cramps. Denies  itching, rash. Denies dizziness, headaches, numbness or seizures. Denies easy bruising or bleeding. Denies confusion, or decreased concentration.  Physical Exam: BP (!) 149/86 (BP Location: Left Arm, Patient Position: Sitting)   Pulse 84   Temp 98.3 F (36.8 C) (Oral)   Resp 16   Ht '5\' 1"'  (1.549 m)   Wt 116 lb (52.6 kg)   SpO2 97%   BMI 21.92 kg/m  General: Alert, oriented, no acute distress. HEENT: Normocephalic, atraumatic sclera anicteric. Chest: Unlabored breathing on room air. Extremities: Grossly normal range of motion.  Warm, well perfused.  No edema bilaterally. GU: Limited exam performed given patient's discomfort with the exam.  External vulva notable for necrotic appearing tumor replacing the posterior aspect of the vulva and vagina, more on the left, extending down to the anus although the anus itself does not look directly involved.  Laboratory & Radiologic Studies: None new  Assessment & Plan: Christina [name] is a 60 y.o. 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  Patient has had significant improvement in her pain on new pain medication regimen.  She and her husband are curious about the possibility of any sort of palliative surgery to decrease her pain and thus her pain medication requirement.  We discussed in detail that given the extensive nature of her tumor as well as its proximity to her rectum, that a surgery to excise her tumor is not feasible.  She had had previous discussion with Dr. Denman George regarding pelvic exoneration, which is often the only procedure in patients with a tumor like hers.  I also share my prior partner's  concerns about other imaging findings that I think make her a poor candidate for consideration of exenteration.  The patient voices again not being interested in such a surgery.  I agreed with plan to undergo 3 cycles of chemotherapy followed by interval imaging to reassess for response.  We discussed in the setting of her disease that the goal is not curative but rather disease control and palliation of symptoms.  Patient has PD-L1 positive tumor and would be a candidate for pembrolizumab if she does not have response to platinum/taxane chemotherapy.  38 minutes of total time was spent for this patient encounter, including preparation, face-to-face counseling with the patient and coordination of care, and documentation of the encounter.  Jeral Pinch, MD  Division of Gynecologic Oncology  Department of Obstetrics and Gynecology  [facility]

## 2020-12-25 NOTE — Telephone Encounter (Signed)
Scheduled for OV. 

## 2020-12-26 ENCOUNTER — Inpatient Hospital Stay (HOSPITAL_BASED_OUTPATIENT_CLINIC_OR_DEPARTMENT_OTHER): Payer: 59 | Admitting: Hematology and Oncology

## 2020-12-26 ENCOUNTER — Encounter: Payer: Self-pay | Admitting: Hematology and Oncology

## 2020-12-26 ENCOUNTER — Ambulatory Visit: Payer: 59 | Admitting: Hematology and Oncology

## 2020-12-26 ENCOUNTER — Inpatient Hospital Stay: Payer: 59

## 2020-12-26 DIAGNOSIS — C519 Malignant neoplasm of vulva, unspecified: Secondary | ICD-10-CM | POA: Diagnosis not present

## 2020-12-26 DIAGNOSIS — R634 Abnormal weight loss: Secondary | ICD-10-CM | POA: Diagnosis not present

## 2020-12-26 DIAGNOSIS — G893 Neoplasm related pain (acute) (chronic): Secondary | ICD-10-CM

## 2020-12-26 DIAGNOSIS — Z5111 Encounter for antineoplastic chemotherapy: Secondary | ICD-10-CM | POA: Diagnosis not present

## 2020-12-26 NOTE — Progress Notes (Signed)
Per Dr. Alvy Bimler - patient does not require IV fluids today. Appointment canceled and message sent to charge nurse.

## 2020-12-26 NOTE — Assessment & Plan Note (Signed)
Her cancer associated pain is well controlled with current prescribed pain medicine She will continue the same

## 2020-12-26 NOTE — Progress Notes (Signed)
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[place]) Clinically, she is improving Her pain is well controlled She is eating better and gaining weight The plan will be to cancel her supportive care IV fluids today since she is doing better I will see her next week to schedule prior to cycle 3 of chemotherapy I plan to repeat CT imaging after cycle 3 of treatment  Cancer associated pain Her cancer associated pain is well controlled with current prescribed pain medicine She will continue the same  Weight loss, non-intentional Overall, she is eating better and gaining weight She does not need IV fluids today I continue to encourage the patient to increase oral intake as tolerated  No orders of the defined types were placed in this encounter.   All questions were answered. The patient knows to call the clinic with any problems, questions or concerns. The total time spent in the appointment was 20 minutes encounter with patients including review of chart and various tests results, discussions about plan of care and coordination of care plan   Christina Lark, MD 12/26/2020 9:34 AM  INTERVAL HISTORY: Please see below for problem oriented charting. she returns for treatment follow-up She is doing better She brought with her documentation of oral intake for the past week On average, she eats 2-3 meals per day She has gained some weight since I saw her On average, she takes breakthrough pain medicine 3-4 times a day Denies recent nausea or constipation  REVIEW OF SYSTEMS:   Constitutional: Denies  fevers, chills or abnormal weight loss Eyes: Denies blurriness of vision Ears, nose, mouth, throat, and face: Denies mucositis or sore throat Respiratory: Denies cough, dyspnea or wheezes Cardiovascular: Denies palpitation, chest discomfort or lower extremity swelling Gastrointestinal:  Denies nausea, heartburn or change in bowel habits Skin: Denies abnormal skin rashes Lymphatics: Denies new lymphadenopathy or easy bruising Neurological:Denies numbness, tingling or new weaknesses Behavioral/Psych: Mood is stable, no new changes  All other systems were reviewed with the patient and are negative.  I have reviewed the past medical history, past surgical history, social history and family history with the patient and they are unchanged from previous note.  ALLERGIES:  has No Known Allergies.  MEDICATIONS:  Current Outpatient Medications  Medication Sig Dispense Refill   bisacodyl (DULCOLAX) 5 MG EC tablet Take 5 mg by mouth daily.     cetirizine (ZYRTEC) 10 MG tablet TAKE 1 TO 2 TABLETS BY MOUTH DAILY AS NEEDED 60 tablet 6   Cholecalciferol (VITAMIN D) 50 MCG (2000 UT) tablet Take 2,000 Units by mouth daily.     dexamethasone (DECADRON) 4 MG tablet Take 1 tablet (4 mg total) by mouth daily. 30 tablet 3   Emollient (AQUAPHOR EX) Apply 1 application topically daily.     gabapentin (NEURONTIN) 300 MG capsule Take 1 capsule (300 mg total) by mouth at bedtime. 30 capsule 3   lactulose (CHRONULAC) 10 GM/15ML solution Take 30 mLs (20 g total) by mouth 3 (three) times daily. 473 mL 3   lidocaine (XYLOCAINE) 5 % ointment Apply 1 application topically as needed for mild pain or moderate pain. 35.44 g 1   lidocaine-prilocaine (EMLA)  cream Apply to affected area once 30 g 3   magnesium oxide (MAG-OX) 400 (240 Mg) MG tablet Take 1 tablet (400 mg total) by mouth daily. 30 tablet 1   metroNIDAZOLE (FLAGYL) 250 MG tablet Take 1 tablet (250 mg total) by mouth 2 (two) times daily. 20 tablet 0   morphine (MS  CONTIN) 30 MG 12 hr tablet Take 1 tablet (30 mg total) by mouth at bedtime. 60 tablet 0   ondansetron (ZOFRAN) 8 MG tablet Take 1 tablet (8 mg total) by mouth every 8 (eight) hours as needed. Start on the third day after cisplatin chemotherapy. 30 tablet 1   oxybutynin (DITROPAN XL) 15 MG 24 hr tablet Take 15 mg by mouth daily.     oxycodone (ROXICODONE) 30 MG immediate release tablet Take 1 tablet (30 mg total) by mouth every 3 (three) hours as needed. 90 tablet 0   polyethylene glycol (MIRALAX / GLYCOLAX) 17 g packet Take 17 g by mouth daily.     Probiotic Product (PROBIOTIC PO) Take 1 capsule by mouth daily.     prochlorperazine (COMPAZINE) 10 MG tablet Take 1 tablet (10 mg total) by mouth every 6 (six) hours as needed (Nausea or vomiting). 30 tablet 1   No current facility-administered medications for this visit.    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 VULVA, BIOPSY:  -  Squamous cell carcinoma   06/16/2020 Initial Diagnosis   The patient began experiencing vulvar burning after her knee replacement in March 2022.  She mentioned this to her primary care physician who looked at it and was unclear about the diagnosis but recommended consultation with a gynecologist.  The patient did not have an established gynecologist and therefore made a new patient consultation with Dr. Dellis Filbert.  Dr. Dellis Filbert saw the patient on 06/16/2020 which revealed a squamous cell carcinoma of the vulva.  The tumor was well differentiated and keratinizing.  Her primary vulvar cancer was not felt to be amenable to primary resection as it was a 6cm lesion replacing the perineal body and adherent with the anal sphincter.     06/30/2020 Imaging   Ct chest, abdomen and pelvis  1. No evidence of distant metastatic disease in the chest, abdomen or pelvis. 2. The patient's known cervical cancer is not  well visualized. No gross bladder or anorectal involvement. No pelvic adenopathy. 3. Tiny right middle lobe nodule, likely benign. Attention on follow-up. 4.  Aortic Atherosclerosis (ICD10-I70.0).   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   Surgeons:  Donaciano Eva, MD   Pathology: right and left inguinofemoral lymph nodes.   Operative findings: clinically suspicious medial left inguinal lymph node. 6cm perineal vulvar tumor.     07/11/2020 Pathology Results   A. LYMPH NODE, RIGHT INGUINAL FEMORAL, RESECTION:  - Metastatic keratinizing squamous cell carcinoma involving one of three lymph nodes (1/3)  - No evidence of extranodal extension   B. LYMPH NODE, LEFT INGUINAL FEMORAL, RESECTION:  - Metastatic keratinizing squamous cell carcinoma involving two of four lymph nodes (2/4)  - No evidence of extranodal extension    08/03/2020 - 10/02/2020 Radiation Therapy   Radiation Treatment Dates: 08/03/2020 through 10/02/2020 Site Technique Total Dose (Gy) Dose per Fx (Gy) Completed Fx Beam Energies  Vulva: Pelvis IMRT 50.4/50.4 1.8  28/28 6X  Vulva: Pelvis_Bst IMRT 12.6/12.6 1.8 7/7 6X  Vulva: Pelvis_Bst_2 IMRT 10/10 2 5/5 6X      Cumulative dose to the vulvar region was 63.0 Gy.  Cumulative dose to the pelvis 50.4 Gy.  The inguinal areas were boosted to a cumulative dose of 60.4 Gy.     08/08/2020 PET scan   1. Intense activity midline along the perineum/external genitalia with differential of FDG urine contamination versus malignancy of the external genitalia. 2. Bilateral mildly enlarged intensely hypermetabolic inguinal lymph nodes. Biopsy clips adjacent to these lymph nodes. Findings concerning for metastatic inguinal adenopathy(large benign-appearing fluid collection in the LEFT groin is presumably a post surgical seroma). 3. No evidence of metastatic pelvic iliac  lymphadenopathy or retroperitoneal periaortic metastatic adenopathy. 4. No evidence of visceral metastasis. 5. No skeletal metastasis.     08/15/2020 Imaging   Vascular US RIGHT:  - No evidence of common femoral vein obstruction.     LEFT:  - There is no evidence of deep vein thrombosis in the lower extremity.     - No cystic structure found in the popliteal fossa.  - Ultrasound characteristics of several enlarged lymph nodes, and a large anechoic area, are noted in the groin.    11/03/2020 PET scan   1. No significant PET-CT findings for regression of tumor or adenopathy. 2. No new chest, abdominal or osseous metastatic disease.     11/08/2020 Pathology Results   A. VULVA, LEFT POSTERIOR, BIOPSY:  - Invasive keratinizing squamous cell carcinoma.   11/13/2020 Cancer Staging   Staging form: Vulva, AJCC 8th Edition - Clinical stage from 11/13/2020: Stage IVA (cT2, cN3, cM0) - Signed by Christina Lark, MD on 11/13/2020 Stage prefix: Initial diagnosis    11/20/2020 Procedure   Placement of a subcutaneous power-injectable port device. Catheter tip in the lower SVC   11/22/2020 -  Chemotherapy   Patient is on Treatment Plan : Vulvar cancer Cisplatin + Paclitaxel q21d plus bevacizumab       PHYSICAL EXAMINATION: ECOG PERFORMANCE STATUS: 1 - Symptomatic but completely ambulatory  Vitals:   12/26/20 0804  BP: (!) 155/89  Pulse: 93  Resp: 18  Temp: (!) 97.5 F (36.4 C)  SpO2: 98%   Filed Weights   12/26/20 0804  Weight: 118 lb 12.8 oz (53.9 kg)    GENERAL:alert, no distress and comfortable NEURO: alert & oriented x 3 with fluent speech, no focal motor/sensory deficits  LABORATORY DATA:  I have reviewed the data as listed    Component Value Date/Time   NA 135 12/12/2020 0922   NA 142 02/18/2015 0000   K 3.4 (L) 12/12/2020 0922   CL 95 (L) 12/12/2020 0922   CO2 28 12/12/2020 0922   GLUCOSE 122 (H) 12/12/2020 0922   BUN 13 12/12/2020 0922   BUN 13 02/18/2015 0000    CREATININE 0.72 12/12/2020 0922   CALCIUM 9.5 12/12/2020 0922   PROT 7.2 12/12/2020 0922   ALBUMIN 3.1 (L) 12/12/2020 0922   AST 22 12/12/2020 0922   ALT 24 12/12/2020 0922   ALKPHOS 73 12/12/2020 0922   BILITOT 0.4 12/12/2020 0922   GFRNONAA >60 12/12/2020 0922    No results found for: SPEP, UPEP  Lab Results  Component Value Date   WBC 17.3 (H) 12/12/2020   NEUTROABS 15.6 (H) 12/12/2020   HGB 10.6 (L) 12/12/2020   HCT 31.3 (L) 12/12/2020   MCV 88.2 12/12/2020   PLT 440 (H) 12/12/2020      Chemistry  Component Value Date/Time   NA 135 12/12/2020 0922   NA 142 02/18/2015 0000   K 3.4 (L) 12/12/2020 0922   CL 95 (L) 12/12/2020 0922   CO2 28 12/12/2020 0922   BUN 13 12/12/2020 0922   BUN 13 02/18/2015 0000   CREATININE 0.72 12/12/2020 0922   GLU 95 02/18/2015 0000      Component Value Date/Time   CALCIUM 9.5 12/12/2020 0922   ALKPHOS 73 12/12/2020 0922   AST 22 12/12/2020 0922   ALT 24 12/12/2020 0922   BILITOT 0.4 12/12/2020 0981

## 2020-12-26 NOTE — Assessment & Plan Note (Signed)
Clinically, she is improving Her pain is well controlled She is eating better and gaining weight The plan will be to cancel her supportive care IV fluids today since she is doing better I will see her next week to schedule prior to cycle 3 of chemotherapy I plan to repeat CT imaging after cycle 3 of treatment

## 2020-12-26 NOTE — Assessment & Plan Note (Signed)
Overall, she is eating better and gaining weight She does not need IV fluids today I continue to encourage the patient to increase oral intake as tolerated

## 2020-12-27 ENCOUNTER — Ambulatory Visit (INDEPENDENT_AMBULATORY_CARE_PROVIDER_SITE_OTHER): Payer: 59 | Admitting: Physical Medicine and Rehabilitation

## 2020-12-27 ENCOUNTER — Encounter: Payer: Self-pay | Admitting: Physical Medicine and Rehabilitation

## 2020-12-27 ENCOUNTER — Other Ambulatory Visit: Payer: Self-pay

## 2020-12-27 VITALS — BP 117/82 | HR 92

## 2020-12-27 DIAGNOSIS — M47816 Spondylosis without myelopathy or radiculopathy, lumbar region: Secondary | ICD-10-CM | POA: Diagnosis not present

## 2020-12-27 DIAGNOSIS — G8929 Other chronic pain: Secondary | ICD-10-CM

## 2020-12-27 DIAGNOSIS — M545 Low back pain, unspecified: Secondary | ICD-10-CM

## 2020-12-27 DIAGNOSIS — M48061 Spinal stenosis, lumbar region without neurogenic claudication: Secondary | ICD-10-CM | POA: Diagnosis not present

## 2020-12-27 NOTE — Progress Notes (Signed)
Pt state lower back pain. Pt state walking, sitting and laying down makes the pain worse. Pt state she takes pain meds to help ease the pain.  Numeric Pain Rating Scale and Functional Assessment Average Pain 9 Pain Right Now 9 My pain is constant and sharp Pain is worse with: walking, sitting, some activites, and laying down Pain improves with: medication   In the last MONTH (on 0-10 scale) has pain interfered with the following?  1. General activity like being  able to carry out your everyday physical activities such as walking, climbing stairs, carrying groceries, or moving a chair?  Rating(7)  2. Relation with others like being able to carry out your usual social activities and roles such as  activities at home, at work and in your community. Rating(8)  3. Enjoyment of life such that you have  been bothered by emotional problems such as feeling anxious, depressed or irritable?  Rating(9)

## 2020-12-27 NOTE — Progress Notes (Signed)
Christina [name] - 60 y.o. female MRN [id number]  Date of birth: [birth year]  Office Visit Note: Visit Date: 12/27/2020 PCP: Burnis Medin, MD Referred by: Burnis Medin, MD  Subjective: Chief Complaint  Patient presents with   Lower Back - Pain   HPI: Christina [name] is a 60 y.o. female who comes in today for evaluation of chronic, worsening and severe left-sided predominant bilateral axial lower back pain.  Patient had bilateral L4-L5 and L5-S1 radiofrequency ablation performed on 02/25/2020 by Dr.Hao Mina Marble at [facility] and reports good and sustained pain relief until the past couple of months.  Patient states pain is exacerbated by movement and activity, currently describes as soreness and aching sensation, rates as 7 out of 10.  Patient reports some relief of pain with home exercise regimen, rest and use of medications as needed.  Patient's recent lumbar MRI exhibits grade 1 anterolisthesis with uncovered bulge grazing the descending left L5 nerve root at L4-L5, there is also mild spinal canal and bilateral foraminal narrowing at this level. There is a disc bulge and left predominant facet hypertrophy at L5-S1.  No high-grade spinal canal stenosis noted.  Patient states she is a very active person and routinely performs Dr. Tressie Ellis McGill's "Big 3" exercises at home.  Patient states she feels like it is time to repeat the radiofrequency ablation and would like to do this soon if all possible.  She feels like the pain she is having at this point is very similar to what she had with Dr. Mina Marble patient denies focal weakness, numbness and tingling.  Patient denies recent trauma or falls.  Patient's course is complicated by squamous cell carcinoma of the vulva requiring IV chemotherapy treatment.  Patient is currently being treated by Dr. Heath Lark at [facility].  Patient states she feels like her cancer associated pain is under control, she is now eating better and  becoming more active.   Review of Systems  Musculoskeletal:  Positive for back pain.  Neurological:  Negative for tingling, sensory change, focal weakness and weakness.  All other systems reviewed and are negative. Otherwise per HPI.  Assessment & Plan: Visit Diagnoses:    ICD-10-CM   1. Spondylosis without myelopathy or radiculopathy, lumbar region  M47.816     2. Chronic left-sided low back pain without sciatica  M54.50    G89.29     3. Spinal stenosis of lumbar region without neurogenic claudication  M48.061     4. Facet hypertrophy of lumbar region  M47.816        Plan: Findings:  Chronic, worsening and severe left sided predominant bilateral axial back pain. Patient continues to have excruciating pain despite good conservative therapies such as home exercise program, rest and use of medications as needed. Patient's clinical presentation and exam are consistent with facet mediated pain. No radicular symptoms noted. We feel the next step is to repeat bilateral L4-L5 and L5-S1 radiofrequency ablation under fluoroscopic guidance. Patient encouraged to remain active and perform home exercises as tolerated. No red flag symptoms noted upon exam today.    Meds & Orders: No orders of the defined types were placed in this encounter.  No orders of the defined types were placed in this encounter.   Follow-up: Return in about 1 week (around 01/03/2021) for Bilateral L4-L5 and L5-S1 radiofrequency ablation. .   Procedures: No procedures performed      Clinical History: MRI LUMBAR SPINE WITHOUT CONTRAST  TECHNIQUE: Multiplanar, multisequence MR imaging of the lumbar spine was performed. No intravenous contrast was administered.   COMPARISON:  03/17/2018 and prior.   FINDINGS: Segmentation:  Standard.   Alignment:  Stepwise grade 1 anterolisthesis at L4-5, L5-S1 level.   Vertebrae: Minimal Modic type 2 endplate degenerative changes. No fracture or aggressive osseous lesion.    Conus medullaris and cauda equina: Conus extends to the L1 level. Conus and cauda equina appear normal.   Disc levels: Multilevel desiccation.   L1-2: No significant disc bulge. Patent spinal canal and neural foramen.   L2-3: No significant disc bulge. Patent spinal canal and neural foramen.   L3-4: Mild disc bulge.  Patent spinal canal and neural foramen.   L4-5: Grade 1 anterolisthesis with uncovered bulge grazing the descending left L5 nerve root. Shallow left foraminal protrusion. Bilateral facet hypertrophy. Mild spinal canal and bilateral neural foraminal narrowing.   L5-S1: Disc bulge and left predominant facet hypertrophy. Patent spinal canal. Mild bilateral neural foraminal narrowing.   Paraspinal and other soft tissues: Negative.   IMPRESSION: L4-5 bulge abutting the descending left L5 nerve root with mild spinal canal and bilateral neural foraminal narrowing, grossly unchanged.   Mild bilateral L5-S1 neural foraminal narrowing.     Electronically Signed   By: Primitivo Gauze M.D.   On: 05/16/2020 11:02   She reports that she has quit smoking. Her smoking use included cigarettes. She has a 1.00 pack-year smoking history. She has never used smokeless tobacco. No results for input(s): HGBA1C, LABURIC in the last 8760 hours.  Objective:  VS:  HT:    WT:   BMI:     BP:117/82  HR:92bpm  TEMP: ( )  RESP:  Physical Exam Vitals and nursing note reviewed.  HENT:     Head: Normocephalic and atraumatic.     Right Ear: External ear normal.     Left Ear: External ear normal.     Nose: Nose normal.     Mouth/Throat:     Mouth: Mucous membranes are dry.  Eyes:     Extraocular Movements: Extraocular movements intact.  Cardiovascular:     Rate and Rhythm: Normal rate.     Pulses: Normal pulses.  Pulmonary:     Effort: Pulmonary effort is normal.  Abdominal:     General: Abdomen is flat. There is no distension.  Musculoskeletal:        General: Tenderness  present.     Cervical back: Normal range of motion.     Right lower leg: No edema.     Left lower leg: No edema.     Comments: Pt is slow to rise from seated position to standing. Concordant low back pain with facet loading, lumbar spine extension and rotation. Strong distal strength without clonus, no pain upon palpation of greater trochanters. Sensation intact bilaterally. Walks independently, gait steady.      Skin:    General: Skin is warm and dry.     Capillary Refill: Capillary refill takes less than 2 seconds.  Neurological:     General: No focal deficit present.     Mental Status: She is alert and oriented to person, place, and time.     Sensory: No sensory deficit.     Motor: No weakness.     Gait: Gait normal.  Psychiatric:        Mood and Affect: Mood normal.        Behavior: Behavior normal.    Ortho Exam  Imaging: No results  found.  Past Medical/Family/Surgical/Social History: Medications & Allergies reviewed per EMR, new medications updated. 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   Degenerative arthritis of knee, bilateral 12/31/2019   Patellofemoral arthritis of left knee 03/10/2019   Acute medial meniscus tear of left knee 11/19/2018   Sprain of medial collateral ligament of left knee 09/01/2018   Neck strain, initial encounter 08/28/2018   Degenerative disc disease, cervical 08/28/2018   Left lumbar radiculopathy 03/05/2018   Right lateral epicondylitis 10/30/2017   SI (sacroiliac)  joint dysfunction 10/30/2017   Nonallopathic lesion of sacral region 10/30/2017   Nonallopathic lesion of lumbar region 10/30/2017   Nonallopathic lesion of thoracic region 10/30/2017   History of recurrent UTIs 04/11/2017   Hypertension, essential, benign 10/29/2016   Allergic rhinitis 10/29/2016   Past Medical History:  Diagnosis Date   Arthritis    Chicken pox    Chronic back pain    History of radiation therapy    Pelvis 08/03/20-10/02/20- Dr. Gery Pray   HLD (hyperlipidemia)    diet controlled   Hypertension    Recurrent upper respiratory infection (URI)    Seasonal allergies    Vulvar cancer ([place])    Family History  Problem Relation Age of Onset   Hyperlipidemia Mother    Hypertension Mother    Stroke Mother    Breast cancer Mother    Prostate cancer Father    Hypertension Maternal Grandmother    Hypertension Paternal Grandmother    Stroke Paternal Grandmother    Allergic rhinitis Neg Hx    Asthma Neg Hx    Ovarian cancer Neg Hx    Uterine cancer Neg Hx    Colon cancer Neg Hx    Pancreatic cancer Neg Hx    Past Surgical History:  Procedure Laterality Date   BLADDER SUSPENSION     CHOLECYSTECTOMY     COLONOSCOPY     INGUINAL LYMPHADENECTOMY Bilateral 07/11/2020   Procedure: INGUINAL LYMPHADENECTOMY;  Surgeon: Everitt Amber, MD;  Location: [facility];  Service: Gynecology;  Laterality: Bilateral;   IR IMAGING GUIDED PORT INSERTION  11/15/2020   TOTAL KNEE ARTHROPLASTY Right 04/25/2020   Procedure: RIGHT TOTAL KNEE ARTHROPLASTY;  Surgeon: Meredith Pel, MD;  Location: [facility];  Service: Orthopedics;  Laterality: Right;   WISDOM TOOTH EXTRACTION     Social History   Occupational History   Occupation: retired  Tobacco Use   Smoking status: Former    Packs/day: 0.50    Years: 2.00    Pack years: 1.00    Types: Cigarettes   Smokeless tobacco: Never   Tobacco comments:    Smoke in 20's  Vaping Use   Vaping Use: Never used  Substance and Sexual Activity    Alcohol use: Not Currently   Drug use: No   Sexual activity: Yes    Partners: Male    Birth control/protection: Post-menopausal

## 2020-12-28 ENCOUNTER — Telehealth: Payer: Self-pay | Admitting: Oncology

## 2020-12-28 NOTE — Telephone Encounter (Signed)
Let me see her first, if not needed we can cancel it We can change her labs to labs only without port access

## 2020-12-28 NOTE — Telephone Encounter (Signed)
Called Sya Nestler back and advised her of message below.  She verbalized agreement and is ok with keeping her port flush apt.

## 2020-12-28 NOTE — Telephone Encounter (Signed)
Allex Madia left a message asking if Dr. Alvy Bimler thinks she needs the fluid appointment on Monday.  She said she is feeling fine and great and hydrated.

## 2021-01-01 ENCOUNTER — Encounter: Payer: Self-pay | Admitting: Hematology and Oncology

## 2021-01-01 ENCOUNTER — Inpatient Hospital Stay: Payer: 59

## 2021-01-01 ENCOUNTER — Inpatient Hospital Stay (HOSPITAL_BASED_OUTPATIENT_CLINIC_OR_DEPARTMENT_OTHER): Payer: 59 | Admitting: Hematology and Oncology

## 2021-01-01 ENCOUNTER — Other Ambulatory Visit: Payer: 59

## 2021-01-01 ENCOUNTER — Inpatient Hospital Stay: Payer: 59 | Admitting: *Deleted

## 2021-01-01 ENCOUNTER — Other Ambulatory Visit: Payer: Self-pay

## 2021-01-01 VITALS — BP 143/94 | HR 98 | Temp 99.0°F | Resp 18 | Ht 61.0 in | Wt 114.0 lb

## 2021-01-01 DIAGNOSIS — K5909 Other constipation: Secondary | ICD-10-CM | POA: Diagnosis not present

## 2021-01-01 DIAGNOSIS — C519 Malignant neoplasm of vulva, unspecified: Secondary | ICD-10-CM

## 2021-01-01 DIAGNOSIS — Z5111 Encounter for antineoplastic chemotherapy: Secondary | ICD-10-CM | POA: Diagnosis not present

## 2021-01-01 DIAGNOSIS — G893 Neoplasm related pain (acute) (chronic): Secondary | ICD-10-CM | POA: Diagnosis not present

## 2021-01-01 LAB — CMP (CANCER CENTER ONLY)
ALT: 25 U/L (ref 0–44)
AST: 19 U/L (ref 15–41)
Albumin: 3.6 g/dL (ref 3.5–5.0)
Alkaline Phosphatase: 67 U/L (ref 38–126)
Anion gap: 10 (ref 5–15)
BUN: 19 mg/dL (ref 8–23)
CO2: 26 mmol/L (ref 22–32)
Calcium: 9.8 mg/dL (ref 8.9–10.3)
Chloride: 100 mmol/L (ref 98–111)
Creatinine: 0.79 mg/dL (ref 0.44–1.00)
GFR, Estimated: >60 mL/min (ref >60)
Glucose, Bld: 127 mg/dL — ABNORMAL HIGH (ref 70–99)
Potassium: 4.3 mmol/L (ref 3.5–5.1)
Sodium: 136 mmol/L (ref 135–145)
Total Bilirubin: 0.6 mg/dL (ref 0.3–1.2)
Total Protein: 7.4 g/dL (ref 6.5–8.1)

## 2021-01-01 LAB — CBC WITH DIFFERENTIAL (CANCER CENTER ONLY)
Abs Immature Granulocytes: 0.26 10*3/uL — ABNORMAL HIGH (ref 0.00–0.07)
Basophils Absolute: 0.1 10*3/uL (ref 0.0–0.1)
Basophils Relative: 0 %
Eosinophils Absolute: 0 10*3/uL (ref 0.0–0.5)
Eosinophils Relative: 0 %
HCT: 36.3 % (ref 36.0–46.0)
Hemoglobin: 12.1 g/dL (ref 12.0–15.0)
Immature Granulocytes: 1 %
Lymphocytes Relative: 3 %
Lymphs Abs: 0.6 10*3/uL — ABNORMAL LOW (ref 0.7–4.0)
MCH: 29.4 pg (ref 26.0–34.0)
MCHC: 33.3 g/dL (ref 30.0–36.0)
MCV: 88.3 fL (ref 80.0–100.0)
Monocytes Absolute: 1.1 10*3/uL — ABNORMAL HIGH (ref 0.1–1.0)
Monocytes Relative: 5 %
Neutro Abs: 18.5 10*3/uL — ABNORMAL HIGH (ref 1.7–7.7)
Neutrophils Relative %: 91 %
Platelet Count: 414 10*3/uL — ABNORMAL HIGH (ref 150–400)
RBC: 4.11 MIL/uL (ref 3.87–5.11)
RDW: 14.9 % (ref 11.5–15.5)
WBC Count: 20.5 10*3/uL — ABNORMAL HIGH (ref 4.0–10.5)
nRBC: 0 % (ref 0.0–0.2)

## 2021-01-01 LAB — MAGNESIUM: Magnesium: 1.7 mg/dL (ref 1.7–2.4)

## 2021-01-01 MED ORDER — SODIUM CHLORIDE 0.9 % IV SOLN
Freq: Once | INTRAVENOUS | Status: AC
Start: 2021-01-01 — End: 2021-01-01

## 2021-01-01 MED ORDER — SODIUM CHLORIDE 0.9% FLUSH
10.0000 mL | Freq: Once | INTRAVENOUS | Status: AC
  Administered 2021-01-01: 10 mL

## 2021-01-01 MED ORDER — HEPARIN SOD (PORK) LOCK FLUSH 100 UNIT/ML IV SOLN
500.0000 [IU] | Freq: Once | INTRAVENOUS | Status: AC | PRN
  Administered 2021-01-01: 500 [IU]

## 2021-01-01 MED ORDER — SODIUM CHLORIDE 0.9% FLUSH
10.0000 mL | Freq: Once | INTRAVENOUS | Status: AC | PRN
  Administered 2021-01-01: 10 mL

## 2021-01-01 NOTE — Patient Instructions (Signed)

## 2021-01-01 NOTE — Progress Notes (Signed)
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[facility]) The patient have recurrent recent pain and constipation She will proceed with treatment as scheduled on Wednesday for cycle 3 I plan to order CT imaging next week for objective assessment of response to therapy I recommend she proceed with IV fluid support today due to signs of dehydration  Cancer associated pain Her pain is well controlled According to her documentation, she takes approximately 4 breakthrough pain medicine per day   Other constipation She battle with significant recurrent constipation recently We discussed importance of regular laxatives  Orders Placed This Encounter  Procedures   CT ABDOMEN PELVIS W CONTRAST    Standing Status:   Future    Standing Expiration Date:   01/01/2022    Order Specific Question:   If indicated for the ordered procedure, I authorize the administration of contrast media per Radiology protocol    Answer:   Yes    Order Specific Question:   Preferred imaging location?    Answer:   [facility]    Order Specific Question:   Radiology Contrast Protocol - do NOT remove file path    Answer:   [email or web address]    All questions were answered. The patient knows to call the clinic with any problems, questions or concerns. The total time spent in the appointment was 20 minutes encounter with patients including review of chart and various tests results, discussions about plan of care and coordination of care plan   Heath Lark, MD 01/01/2021 10:43  AM  INTERVAL HISTORY: Please see below for problem oriented charting. she returns for treatment follow-up to be seen prior to cycle 3 of chemotherapy Her pain is well controlled She had severe constipation for 2 days and then have frequent follow-up bowel movement afterwards No peripheral neuropathy  REVIEW OF SYSTEMS:   Constitutional: Denies fevers, chills or abnormal weight loss Eyes: Denies blurriness of vision Ears, nose, mouth, throat, and face: Denies mucositis or sore throat Respiratory: Denies cough, dyspnea or wheezes Cardiovascular: Denies palpitation, chest discomfort or lower extremity swelling Gastrointestinal:  Denies nausea, heartburn or change in bowel habits Skin: Denies abnormal skin rashes Lymphatics: Denies new lymphadenopathy or easy bruising Neurological:Denies numbness, tingling or new weaknesses Behavioral/Psych: Mood is stable, no new changes  All other systems were reviewed with the patient and are negative.  I have reviewed the past medical history, past surgical history, social history and family history with the patient and they are unchanged from previous note.  ALLERGIES:  has No Known Allergies.  MEDICATIONS:  Current Outpatient Medications  Medication Sig Dispense Refill   bisacodyl (DULCOLAX) 5 MG EC tablet Take 5 mg by mouth daily.     cetirizine (ZYRTEC) 10 MG tablet TAKE 1 TO 2 TABLETS BY MOUTH DAILY AS NEEDED 60 tablet 6   Cholecalciferol (VITAMIN D) 50 MCG (2000 UT) tablet Take 2,000 Units by mouth daily.     dexamethasone (DECADRON) 4 MG tablet Take 1 tablet (4 mg total) by mouth daily. 30 tablet 3   Emollient (AQUAPHOR EX) Apply 1 application topically daily.     gabapentin (NEURONTIN) 300 MG capsule Take 1 capsule (300 mg total)  by mouth at bedtime. 30 capsule 3   lactulose (CHRONULAC) 10 GM/15ML solution Take 30 mLs (20 g total) by mouth 3 (three) times daily. 473 mL 3   lidocaine (XYLOCAINE) 5 % ointment Apply 1 application topically as  needed for mild pain or moderate pain. 35.44 g 1   lidocaine-prilocaine (EMLA) cream Apply to affected area once 30 g 3   magnesium oxide (MAG-OX) 400 (240 Mg) MG tablet Take 1 tablet (400 mg total) by mouth daily. 30 tablet 1   metroNIDAZOLE (FLAGYL) 250 MG tablet Take 1 tablet (250 mg total) by mouth 2 (two) times daily. 20 tablet 0   morphine (MS CONTIN) 30 MG 12 hr tablet Take 1 tablet (30 mg total) by mouth at bedtime. 60 tablet 0   ondansetron (ZOFRAN) 8 MG tablet Take 1 tablet (8 mg total) by mouth every 8 (eight) hours as needed. Start on the third day after cisplatin chemotherapy. 30 tablet 1   oxybutynin (DITROPAN XL) 15 MG 24 hr tablet Take 15 mg by mouth daily.     oxycodone (ROXICODONE) 30 MG immediate release tablet Take 1 tablet (30 mg total) by mouth every 3 (three) hours as needed. 90 tablet 0   polyethylene glycol (MIRALAX / GLYCOLAX) 17 g packet Take 17 g by mouth daily.     Probiotic Product (PROBIOTIC PO) Take 1 capsule by mouth daily.     prochlorperazine (COMPAZINE) 10 MG tablet Take 1 tablet (10 mg total) by mouth every 6 (six) hours as needed (Nausea or vomiting). 30 tablet 1   No current facility-administered medications for this visit.   Facility-Administered Medications Ordered in Other Visits  Medication Dose Route Frequency Provider Last Rate Last Admin   heparin lock flush 100 unit/mL  500 Units Intracatheter Once PRN Alvy Bimler, Maudene Stotler, MD       sodium chloride flush (NS) 0.9 % injection 10 mL  10 mL Intracatheter Once PRN Heath Lark, MD        SUMMARY OF ONCOLOGIC HISTORY: Oncology History Overview Note  PD-L1 CPS score is 15%   Squamous cell carcinoma of vulva ([place])  06/15/2020 Pathology Results   A. VULVA, BIOPSY:  -  Squamous cell carcinoma   06/16/2020 Initial Diagnosis   The patient began experiencing vulvar burning after her knee replacement in March 2022.  She mentioned this to her primary care physician who looked at it and was unclear about the  diagnosis but recommended consultation with a gynecologist.  The patient did not have an established gynecologist and therefore made a new patient consultation with Dr. Dellis Filbert.  Dr. Dellis Filbert saw the patient on 06/16/2020 which revealed a squamous cell carcinoma of the vulva.  The tumor was well differentiated and keratinizing.  Her primary vulvar cancer was not felt to be amenable to primary resection as it was a 6cm lesion replacing the perineal body and adherent with the anal sphincter.     06/30/2020 Imaging   Ct chest, abdomen and pelvis  1. No evidence of distant metastatic disease in the chest, abdomen or pelvis. 2. The patient's known cervical cancer is not well visualized. No gross bladder or anorectal involvement. No pelvic adenopathy. 3. Tiny right middle lobe nodule, likely benign. Attention on follow-up. 4.  Aortic Atherosclerosis (ICD10-I70.0).   07/03/2020 Initial Diagnosis   Squamous cell carcinoma of vulva ([place])   07/11/2020 Surgery   Surgery: bilateral inguinofemoral lymphadenectomy.   Surgeons:  Donaciano Eva, MD   Pathology: right and left inguinofemoral lymph nodes.  Operative findings: clinically suspicious medial left inguinal lymph node. 6cm perineal vulvar tumor.     07/11/2020 Pathology Results   A. LYMPH NODE, RIGHT INGUINAL FEMORAL, RESECTION:  - Metastatic keratinizing squamous cell carcinoma involving one of three lymph nodes (1/3)  - No evidence of extranodal extension   B. LYMPH NODE, LEFT INGUINAL FEMORAL, RESECTION:  - Metastatic keratinizing squamous cell carcinoma involving two of four lymph nodes (2/4)  - No evidence of extranodal extension    08/03/2020 - 10/02/2020 Radiation Therapy   Radiation Treatment Dates: 08/03/2020 through 10/02/2020 Site Technique Total Dose (Gy) Dose per Fx (Gy) Completed Fx Beam Energies  Vulva: Pelvis IMRT 50.4/50.4 1.8 28/28 6X  Vulva: Pelvis_Bst IMRT 12.6/12.6 1.8 7/7 6X  Vulva: Pelvis_Bst_2 IMRT 10/10 2 5/5 6X       Cumulative dose to the vulvar region was 63.0 Gy.  Cumulative dose to the pelvis 50.4 Gy.  The inguinal areas were boosted to a cumulative dose of 60.4 Gy.     08/08/2020 PET scan   1. Intense activity midline along the perineum/external genitalia with differential of FDG urine contamination versus malignancy of the external genitalia. 2. Bilateral mildly enlarged intensely hypermetabolic inguinal lymph nodes. Biopsy clips adjacent to these lymph nodes. Findings concerning for metastatic inguinal adenopathy(large benign-appearing fluid collection in the LEFT groin is presumably a post surgical seroma). 3. No evidence of metastatic pelvic iliac lymphadenopathy or retroperitoneal periaortic metastatic adenopathy. 4. No evidence of visceral metastasis. 5. No skeletal metastasis.     08/15/2020 Imaging   Vascular US RIGHT:  - No evidence of common femoral vein obstruction.     LEFT:  - There is no evidence of deep vein thrombosis in the lower extremity.     - No cystic structure found in the popliteal fossa.  - Ultrasound characteristics of several enlarged lymph nodes, and a large anechoic area, are noted in the groin.    11/03/2020 PET scan   1. No significant PET-CT findings for regression of tumor or adenopathy. 2. No new chest, abdominal or osseous metastatic disease.     11/08/2020 Pathology Results   A. VULVA, LEFT POSTERIOR, BIOPSY:  - Invasive keratinizing squamous cell carcinoma.   11/13/2020 Cancer Staging   Staging form: Vulva, AJCC 8th Edition - Clinical stage from 11/13/2020: Stage IVA (cT2, cN3, cM0) - Signed by Heath Lark, MD on 11/13/2020 Stage prefix: Initial diagnosis    11/20/2020 Procedure   Placement of a subcutaneous power-injectable port device. Catheter tip in the lower SVC   11/22/2020 -  Chemotherapy   Patient is on Treatment Plan : Vulvar cancer Cisplatin + Paclitaxel q21d plus bevacizumab       PHYSICAL EXAMINATION: ECOG PERFORMANCE STATUS: 1 -  Symptomatic but completely ambulatory  Vitals:   01/01/21 0834  BP: (!) 143/94  Pulse: 98  Resp: 18  Temp: 99 F (37.2 C)  SpO2: 98%   Filed Weights   01/01/21 0834  Weight: 114 lb (51.7 kg)    GENERAL:alert, no distress and comfortable NEURO: alert & oriented x 3 with fluent speech, no focal motor/sensory deficits  LABORATORY DATA:  I have reviewed the data as listed    Component Value Date/Time   NA 136 01/01/2021 0905   NA 142 02/18/2015 0000   K 4.3 01/01/2021 0905   CL 100 01/01/2021 0905   CO2 26 01/01/2021 0905   GLUCOSE 127 (H) 01/01/2021 0905   BUN 19 01/01/2021 0905   BUN 13 02/18/2015 0000   CREATININE  0.79 01/01/2021 0905   CALCIUM 9.8 01/01/2021 0905   PROT 7.4 01/01/2021 0905   ALBUMIN 3.6 01/01/2021 0905   AST 19 01/01/2021 0905   ALT 25 01/01/2021 0905   ALKPHOS 67 01/01/2021 0905   BILITOT 0.6 01/01/2021 0905   GFRNONAA >60 01/01/2021 0905    No results found for: SPEP, UPEP  Lab Results  Component Value Date   WBC 20.5 (H) 01/01/2021   NEUTROABS 18.5 (H) 01/01/2021   HGB 12.1 01/01/2021   HCT 36.3 01/01/2021   MCV 88.3 01/01/2021   PLT 414 (H) 01/01/2021      Chemistry      Component Value Date/Time   NA 136 01/01/2021 0905   NA 142 02/18/2015 0000   K 4.3 01/01/2021 0905   CL 100 01/01/2021 0905   CO2 26 01/01/2021 0905   BUN 19 01/01/2021 0905   BUN 13 02/18/2015 0000   CREATININE 0.79 01/01/2021 0905   GLU 95 02/18/2015 0000      Component Value Date/Time   CALCIUM 9.8 01/01/2021 0905   ALKPHOS 67 01/01/2021 0905   AST 19 01/01/2021 0905   ALT 25 01/01/2021 0905   BILITOT 0.6 01/01/2021 0905

## 2021-01-01 NOTE — Assessment & Plan Note (Signed)
She battle with significant recurrent constipation recently We discussed importance of regular laxatives

## 2021-01-01 NOTE — Progress Notes (Signed)
Patient could not produce urine sample at this time. She reports when she is unable to do so she takes the specimen cup home and will bring a sample back to the lab when she is able.

## 2021-01-01 NOTE — Assessment & Plan Note (Signed)
The patient have recurrent recent pain and constipation She will proceed with treatment as scheduled on Wednesday for cycle 3 I plan to order CT imaging next week for objective assessment of response to therapy I recommend she proceed with IV fluid support today due to signs of dehydration

## 2021-01-01 NOTE — Assessment & Plan Note (Signed)
Her pain is well controlled According to her documentation, she takes approximately 4 breakthrough pain medicine per day

## 2021-01-02 ENCOUNTER — Inpatient Hospital Stay: Payer: 59

## 2021-01-02 ENCOUNTER — Other Ambulatory Visit: Payer: Self-pay

## 2021-01-02 ENCOUNTER — Ambulatory Visit: Payer: 59 | Admitting: Nurse Practitioner

## 2021-01-02 ENCOUNTER — Inpatient Hospital Stay: Payer: 59 | Admitting: Nurse Practitioner

## 2021-01-02 ENCOUNTER — Telehealth: Payer: Self-pay | Admitting: Oncology

## 2021-01-02 ENCOUNTER — Inpatient Hospital Stay: Payer: 59 | Admitting: Hematology and Oncology

## 2021-01-02 DIAGNOSIS — Z5111 Encounter for antineoplastic chemotherapy: Secondary | ICD-10-CM | POA: Diagnosis not present

## 2021-01-02 DIAGNOSIS — C519 Malignant neoplasm of vulva, unspecified: Secondary | ICD-10-CM

## 2021-01-02 LAB — TOTAL PROTEIN, URINE DIPSTICK: Protein, ur: <30 mg/dL — AB

## 2021-01-02 MED FILL — Dexamethasone Sodium Phosphate Inj 100 MG/10ML: INTRAMUSCULAR | Qty: 1 | Status: AC

## 2021-01-02 MED FILL — Fosaprepitant Dimeglumine For IV Infusion 150 MG (Base Eq): INTRAVENOUS | Qty: 5 | Status: AC

## 2021-01-02 NOTE — Telephone Encounter (Signed)
MIRALAX BID, Lactulose TID, sennokor 2 pills TID

## 2021-01-02 NOTE — Telephone Encounter (Signed)
Christina [name] left a message saying that she is constipated today.  She is going to take lactulose but would like a return call with any suggestions.  Called her back and left a message.

## 2021-01-02 NOTE — Telephone Encounter (Signed)
Called Christina [name] back and advised her of recommendations below from Dr. Alvy Bimler.  She verbalized understanding and agreement.

## 2021-01-02 NOTE — Telephone Encounter (Signed)
Christina [name] called back and said she did not have a bowel movement yesterday or today.  She took Miralax yesterday am and today.  She also took [employer] yesterday afternoon.  She did take a dose of lactulose this morning.  She had pain this morning from constipation which has subsided.  She has taken two oxycodone tablets (15 mg early this morning and 30 mg a few minutes ago) for pain so far this morning. She wanted to let Dr. Alvy Bimler know and is wondering if there is anything else she should be doing.

## 2021-01-03 ENCOUNTER — Inpatient Hospital Stay: Payer: 59

## 2021-01-03 ENCOUNTER — Other Ambulatory Visit: Payer: Self-pay | Admitting: Hematology and Oncology

## 2021-01-03 ENCOUNTER — Other Ambulatory Visit: Payer: Self-pay

## 2021-01-03 VITALS — BP 170/85 | HR 84 | Temp 98.2°F | Resp 18

## 2021-01-03 DIAGNOSIS — C519 Malignant neoplasm of vulva, unspecified: Secondary | ICD-10-CM

## 2021-01-03 DIAGNOSIS — Z5111 Encounter for antineoplastic chemotherapy: Secondary | ICD-10-CM | POA: Diagnosis not present

## 2021-01-03 MED ORDER — SODIUM CHLORIDE 0.9 % IV SOLN
40.0000 mg/m2 | Freq: Once | INTRAVENOUS | Status: AC
  Administered 2021-01-03: 60 mg via INTRAVENOUS
  Filled 2021-01-03: qty 60

## 2021-01-03 MED ORDER — SODIUM CHLORIDE 0.9 % IV SOLN
15.0000 mg/kg | Freq: Once | INTRAVENOUS | Status: AC
  Administered 2021-01-03: 800 mg via INTRAVENOUS
  Filled 2021-01-03: qty 32

## 2021-01-03 MED ORDER — SODIUM CHLORIDE 0.9 % IV SOLN
10.0000 mg | Freq: Once | INTRAVENOUS | Status: AC
  Administered 2021-01-03: 10 mg via INTRAVENOUS
  Filled 2021-01-03: qty 10

## 2021-01-03 MED ORDER — SODIUM CHLORIDE 0.9 % IV SOLN
175.0000 mg/m2 | Freq: Once | INTRAVENOUS | Status: AC
  Administered 2021-01-03: 258 mg via INTRAVENOUS
  Filled 2021-01-03: qty 43

## 2021-01-03 MED ORDER — SODIUM CHLORIDE 0.9 % IV SOLN: INTRAVENOUS | Status: AC

## 2021-01-03 MED ORDER — FAMOTIDINE 20 MG IN NS 100 ML IVPB
20.0000 mg | Freq: Once | INTRAVENOUS | Status: AC
  Administered 2021-01-03: 20 mg via INTRAVENOUS
  Filled 2021-01-03: qty 100

## 2021-01-03 MED ORDER — PALONOSETRON HCL INJECTION 0.25 MG/5ML
0.2500 mg | Freq: Once | INTRAVENOUS | Status: AC
  Administered 2021-01-03: 0.25 mg via INTRAVENOUS
  Filled 2021-01-03: qty 5

## 2021-01-03 MED ORDER — SODIUM CHLORIDE 0.9 % IV SOLN
150.0000 mg | Freq: Once | INTRAVENOUS | Status: AC
  Administered 2021-01-03: 150 mg via INTRAVENOUS
  Filled 2021-01-03: qty 150

## 2021-01-03 MED ORDER — SODIUM CHLORIDE 0.9 % IV SOLN: Freq: Once | INTRAVENOUS | Status: AC

## 2021-01-03 MED ORDER — HEPARIN SOD (PORK) LOCK FLUSH 100 UNIT/ML IV SOLN
500.0000 [IU] | Freq: Once | INTRAVENOUS | Status: AC | PRN
  Administered 2021-01-03: 500 [IU]

## 2021-01-03 MED ORDER — SODIUM CHLORIDE 0.9% FLUSH
10.0000 mL | INTRAVENOUS | Status: DC | PRN
  Administered 2021-01-03: 10 mL

## 2021-01-03 MED ORDER — DIPHENHYDRAMINE HCL 50 MG/ML IJ SOLN
25.0000 mg | Freq: Once | INTRAMUSCULAR | Status: AC
  Administered 2021-01-03: 25 mg via INTRAVENOUS
  Filled 2021-01-03: qty 1

## 2021-01-03 MED ORDER — MAGNESIUM SULFATE 2 GM/50ML IV SOLN
2.0000 g | Freq: Once | INTRAVENOUS | Status: AC
  Administered 2021-01-03: 2 g via INTRAVENOUS
  Filled 2021-01-03: qty 50

## 2021-01-03 NOTE — Patient Instructions (Signed)
[facility]  Discharge Instructions: Thank you for choosing [facility] to provide your oncology and hematology care.   If you have a lab appointment with the [facility], please go directly to the [facility] and check in at the registration area.   Wear comfortable clothing and clothing appropriate for easy access to any Portacath or PICC line.   We strive to give you quality time with your provider. You may need to reschedule your appointment if you arrive late (15 or more minutes).  Arriving late affects you and other patients whose appointments are after yours.  Also, if you miss three or more appointments without notifying the office, you may be dismissed from the clinic at the provider's discretion.      For prescription refill requests, have your pharmacy contact our office and allow 72 hours for refills to be completed.    Today you received the following chemotherapy and/or immunotherapy agents: Bevacizumab, Taxol & Cisplatin   To help prevent nausea and vomiting after your treatment, we encourage you to take your nausea medication as directed.  BELOW ARE SYMPTOMS THAT SHOULD BE REPORTED IMMEDIATELY: *FEVER GREATER THAN 100.4 F (38 C) OR HIGHER *CHILLS OR SWEATING *NAUSEA AND VOMITING THAT IS NOT CONTROLLED WITH YOUR NAUSEA MEDICATION *UNUSUAL SHORTNESS OF BREATH *UNUSUAL BRUISING OR BLEEDING *URINARY PROBLEMS (pain or burning when urinating, or frequent urination) *BOWEL PROBLEMS (unusual diarrhea, constipation, pain near the anus) TENDERNESS IN MOUTH AND THROAT WITH OR WITHOUT PRESENCE OF ULCERS (sore throat, sores in mouth, or a toothache) UNUSUAL RASH, SWELLING OR PAIN  UNUSUAL VAGINAL DISCHARGE OR ITCHING   Items with * indicate a potential emergency and should be followed up as soon as possible or go to the Emergency Department if any problems should occur.  Please show the CHEMOTHERAPY ALERT CARD or IMMUNOTHERAPY ALERT  CARD at check-in to the Emergency Department and triage nurse.  Should you have questions after your visit or need to cancel or reschedule your appointment, please contact [facility]  Dept: [phone]  and follow the prompts.  Office hours are 8:00 a.m. to 4:30 p.m. Monday - Friday. Please note that voicemails left after 4:00 p.m. may not be returned until the following business day.  We are closed weekends and major holidays. You have access to a nurse at all times for urgent questions. Please call the main number to the clinic Dept: [phone] and follow the prompts.   For any non-urgent questions, you may also contact your provider using MyChart. We now offer e-Visits for anyone 49 and older to request care online for non-urgent symptoms. For details visit [email or web address].   Also download the MyChart app! Go to the app store, search "MyChart", open the app, select [facility], and log in with your MyChart username and password.  Due to Covid, a mask is required upon entering the hospital/clinic. If you do not have a mask, one will be given to you upon arrival. For doctor visits, patients may have 1 support person aged 92 or older with them. For treatment visits, patients cannot have anyone with them due to current Covid guidelines and our immunocompromised population.   Paclitaxel injection What is this medication? PACLITAXEL (PAK li TAX el) is a chemotherapy drug. It targets fast dividing cells, like cancer cells, and causes these cells to die. This medicine is used to treat ovarian cancer, breast cancer, lung cancer, Kaposi's sarcoma, and other cancers. This medicine may  be used for other purposes; ask your health care provider or pharmacist if you have questions. COMMON BRAND NAME(S): Onxol, Taxol What should I tell my care team before I take this medication? They need to know if you have any of these conditions: history of irregular heartbeat liver  disease low blood counts, like low white cell, platelet, or red cell counts lung or breathing disease, like asthma tingling of the fingers or toes, or other nerve disorder an unusual or allergic reaction to paclitaxel, alcohol, polyoxyethylated castor oil, other chemotherapy, other medicines, foods, dyes, or preservatives pregnant or trying to get pregnant breast-feeding How should I use this medication? This drug is given as an infusion into a vein. It is administered in a hospital or clinic by a specially trained health care professional. Talk to your pediatrician regarding the use of this medicine in children. Special care may be needed. Overdosage: If you think you have taken too much of this medicine contact a poison control center or emergency room at once. NOTE: This medicine is only for you. Do not share this medicine with others. What if I miss a dose? It is important not to miss your dose. Call your doctor or health care professional if you are unable to keep an appointment. What may interact with this medication? Do not take this medicine with any of the following medications: live virus vaccines This medicine may also interact with the following medications: antiviral medicines for hepatitis, HIV or AIDS certain antibiotics like erythromycin and clarithromycin certain medicines for fungal infections like ketoconazole and itraconazole certain medicines for seizures like carbamazepine, phenobarbital, phenytoin gemfibrozil nefazodone rifampin St. John's wort This list may not describe all possible interactions. Give your health care provider a list of all the medicines, herbs, non-prescription drugs, or dietary supplements you use. Also tell them if you smoke, drink alcohol, or use illegal drugs. Some items may interact with your medicine. What should I watch for while using this medication? Your condition will be monitored carefully while you are receiving this medicine. You  will need important blood work done while you are taking this medicine. This medicine can cause serious allergic reactions. To reduce your risk you will need to take other medicine(s) before treatment with this medicine. If you experience allergic reactions like skin rash, itching or hives, swelling of the face, lips, or tongue, tell your doctor or health care professional right away. In some cases, you may be given additional medicines to help with side effects. Follow all directions for their use. This drug may make you feel generally unwell. This is not uncommon, as chemotherapy can affect healthy cells as well as cancer cells. Report any side effects. Continue your course of treatment even though you feel ill unless your doctor tells you to stop. Call your doctor or health care professional for advice if you get a fever, chills or sore throat, or other symptoms of a cold or flu. Do not treat yourself. This drug decreases your body's ability to fight infections. Try to avoid being around people who are sick. This medicine may increase your risk to bruise or bleed. Call your doctor or health care professional if you notice any unusual bleeding. Be careful brushing and flossing your teeth or using a toothpick because you may get an infection or bleed more easily. If you have any dental work done, tell your dentist you are receiving this medicine. Avoid taking products that contain aspirin, acetaminophen, ibuprofen, naproxen, or ketoprofen unless instructed by your  doctor. These medicines may hide a fever. Do not become pregnant while taking this medicine. Women should inform their doctor if they wish to become pregnant or think they might be pregnant. There is a potential for serious side effects to an unborn child. Talk to your health care professional or pharmacist for more information. Do not breast-feed an infant while taking this medicine. Men are advised not to father a child while receiving this  medicine. This product may contain alcohol. Ask your pharmacist or healthcare provider if this medicine contains alcohol. Be sure to tell all healthcare providers you are taking this medicine. Certain medicines, like metronidazole and disulfiram, can cause an unpleasant reaction when taken with alcohol. The reaction includes flushing, headache, nausea, vomiting, sweating, and increased thirst. The reaction can last from 30 minutes to several hours. What side effects may I notice from receiving this medication? Side effects that you should report to your doctor or health care professional as soon as possible: allergic reactions like skin rash, itching or hives, swelling of the face, lips, or tongue breathing problems changes in vision fast, irregular heartbeat high or low blood pressure mouth sores pain, tingling, numbness in the hands or feet signs of decreased platelets or bleeding - bruising, pinpoint red spots on the skin, black, tarry stools, blood in the urine signs of decreased red blood cells - unusually weak or tired, feeling faint or lightheaded, falls signs of infection - fever or chills, cough, sore throat, pain or difficulty passing urine signs and symptoms of liver injury like dark yellow or brown urine; general ill feeling or flu-like symptoms; light-colored stools; loss of appetite; nausea; right upper belly pain; unusually weak or tired; yellowing of the eyes or skin swelling of the ankles, feet, hands unusually slow heartbeat Side effects that usually do not require medical attention (report to your doctor or health care professional if they continue or are bothersome): diarrhea hair loss loss of appetite muscle or joint pain nausea, vomiting pain, redness, or irritation at site where injected tiredness This list may not describe all possible side effects. Call your doctor for medical advice about side effects. You may report side effects to FDA at 1-800-FDA-1088. Where  should I keep my medication? This drug is given in a hospital or clinic and will not be stored at home. NOTE: This sheet is a summary. It may not cover all possible information. If you have questions about this medicine, talk to your doctor, pharmacist, or health care provider.  2022 Elsevier/Gold Standard (2019-01-06 13:37:23)  Cisplatin injection What is this medication? CISPLATIN (SIS pla tin) is a chemotherapy drug. It targets fast dividing cells, like cancer cells, and causes these cells to die. This medicine is used to treat many types of cancer like bladder, ovarian, and testicular cancers. This medicine may be used for other purposes; ask your health care provider or pharmacist if you have questions. COMMON BRAND NAME(S): Platinol, Platinol -AQ What should I tell my care team before I take this medication? They need to know if you have any of these conditions: eye disease, vision problems hearing problems kidney disease low blood counts, like white cells, platelets, or red blood cells tingling of the fingers or toes, or other nerve disorder an unusual or allergic reaction to cisplatin, carboplatin, oxaliplatin, other medicines, foods, dyes, or preservatives pregnant or trying to get pregnant breast-feeding How should I use this medication? This drug is given as an infusion into a vein. It is administered in a hospital  or clinic by a specially trained health care professional. Talk to your pediatrician regarding the use of this medicine in children. Special care may be needed. Overdosage: If you think you have taken too much of this medicine contact a poison control center or emergency room at once. NOTE: This medicine is only for you. Do not share this medicine with others. What if I miss a dose? It is important not to miss a dose. Call your doctor or health care professional if you are unable to keep an appointment. What may interact with this medication? This medicine may  interact with the following medications: foscarnet certain antibiotics like amikacin, gentamicin, neomycin, polymyxin B, streptomycin, tobramycin, vancomycin This list may not describe all possible interactions. Give your health care provider a list of all the medicines, herbs, non-prescription drugs, or dietary supplements you use. Also tell them if you smoke, drink alcohol, or use illegal drugs. Some items may interact with your medicine. What should I watch for while using this medication? Your condition will be monitored carefully while you are receiving this medicine. You will need important blood work done while you are taking this medicine. This drug may make you feel generally unwell. This is not uncommon, as chemotherapy can affect healthy cells as well as cancer cells. Report any side effects. Continue your course of treatment even though you feel ill unless your doctor tells you to stop. This medicine may increase your risk of getting an infection. Call your healthcare professional for advice if you get a fever, chills, or sore throat, or other symptoms of a cold or flu. Do not treat yourself. Try to avoid being around people who are sick. Avoid taking medicines that contain aspirin, acetaminophen, ibuprofen, naproxen, or ketoprofen unless instructed by your healthcare professional. These medicines may hide a fever. This medicine may increase your risk to bruise or bleed. Call your doctor or health care professional if you notice any unusual bleeding. Be careful brushing and flossing your teeth or using a toothpick because you may get an infection or bleed more easily. If you have any dental work done, tell your dentist you are receiving this medicine. Do not become pregnant while taking this medicine or for 14 months after stopping it. Women should inform their healthcare professional if they wish to become pregnant or think they might be pregnant. Men should not father a child while taking  this medicine and for 11 months after stopping it. There is potential for serious side effects to an unborn child. Talk to your healthcare professional for more information. Do not breast-feed an infant while taking this medicine. This medicine has caused ovarian failure in some women. This medicine may make it more difficult to get pregnant. Talk to your healthcare professional if you are concerned about your fertility. This medicine has caused decreased sperm counts in some men. This may make it more difficult to father a child. Talk to your healthcare professional if you are concerned about your fertility. Drink fluids as directed while you are taking this medicine. This will help protect your kidneys. Call your doctor or health care professional if you get diarrhea. Do not treat yourself. What side effects may I notice from receiving this medication? Side effects that you should report to your doctor or health care professional as soon as possible: allergic reactions like skin rash, itching or hives, swelling of the face, lips, or tongue blurred vision changes in vision decreased hearing or ringing of the ears nausea, vomiting pain,  redness, or irritation at site where injected pain, tingling, numbness in the hands or feet signs and symptoms of bleeding such as bloody or black, tarry stools; red or dark brown urine; spitting up blood or brown material that looks like coffee grounds; red spots on the skin; unusual bruising or bleeding from the eyes, gums, or nose signs and symptoms of infection like fever; chills; cough; sore throat; pain or trouble passing urine signs and symptoms of kidney injury like trouble passing urine or change in the amount of urine signs and symptoms of low red blood cells or anemia such as unusually weak or tired; feeling faint or lightheaded; falls; breathing problems Side effects that usually do not require medical attention (report to your doctor or health care  professional if they continue or are bothersome): loss of appetite mouth sores muscle cramps This list may not describe all possible side effects. Call your doctor for medical advice about side effects. You may report side effects to FDA at 1-800-FDA-1088. Where should I keep my medication? This drug is given in a hospital or clinic and will not be stored at home. NOTE: This sheet is a summary. It may not cover all possible information. If you have questions about this medicine, talk to your doctor, pharmacist, or health care provider.  2022 Elsevier/Gold Standard (2018-01-30 15:59:17)

## 2021-01-09 ENCOUNTER — Other Ambulatory Visit: Payer: Self-pay

## 2021-01-09 ENCOUNTER — Ambulatory Visit (HOSPITAL_COMMUNITY)
Admission: RE | Admit: 2021-01-09 | Discharge: 2021-01-09 | Disposition: A | Discharge status: Home / Self Care | Payer: 59 | Source: Ambulatory Visit | Attending: Hematology and Oncology | Admitting: Hematology and Oncology

## 2021-01-09 DIAGNOSIS — C519 Malignant neoplasm of vulva, unspecified: Secondary | ICD-10-CM | POA: Insufficient documentation

## 2021-01-09 MED ORDER — IOHEXOL 350 MG/ML SOLN
60.0000 mL | Freq: Once | INTRAVENOUS | Status: AC | PRN
  Administered 2021-01-09: 60 mL via INTRAVENOUS

## 2021-01-09 MED ORDER — SODIUM CHLORIDE (PF) 0.9 % IJ SOLN
INTRAMUSCULAR | Status: AC
  Filled 2021-01-09: qty 50

## 2021-01-15 ENCOUNTER — Other Ambulatory Visit: Payer: Self-pay

## 2021-01-15 ENCOUNTER — Telehealth: Payer: Self-pay | Admitting: Oncology

## 2021-01-15 ENCOUNTER — Other Ambulatory Visit (HOSPITAL_COMMUNITY): Payer: Self-pay

## 2021-01-15 ENCOUNTER — Inpatient Hospital Stay (HOSPITAL_BASED_OUTPATIENT_CLINIC_OR_DEPARTMENT_OTHER): Payer: 59 | Admitting: Hematology and Oncology

## 2021-01-15 DIAGNOSIS — R309 Painful micturition, unspecified: Secondary | ICD-10-CM

## 2021-01-15 DIAGNOSIS — C519 Malignant neoplasm of vulva, unspecified: Secondary | ICD-10-CM

## 2021-01-15 DIAGNOSIS — Z7189 Other specified counseling: Secondary | ICD-10-CM

## 2021-01-15 DIAGNOSIS — G893 Neoplasm related pain (acute) (chronic): Secondary | ICD-10-CM | POA: Diagnosis not present

## 2021-01-15 DIAGNOSIS — Z5111 Encounter for antineoplastic chemotherapy: Secondary | ICD-10-CM | POA: Diagnosis not present

## 2021-01-15 MED ORDER — OXYCODONE HCL 30 MG PO TABS
30.0000 mg | ORAL_TABLET | ORAL | 0 refills | Status: AC | PRN
Start: 2021-01-15 — End: ?
  Filled 2021-01-15: qty 90, 12d supply, fill #0

## 2021-01-15 MED ORDER — MORPHINE SULFATE ER 30 MG PO TBCR
30.0000 mg | EXTENDED_RELEASE_TABLET | Freq: Three times a day (TID) | ORAL | 0 refills | Status: AC
  Filled 2021-01-15: qty 90, 30d supply, fill #0

## 2021-01-15 MED ORDER — DEXAMETHASONE 4 MG PO TABS
4.0000 mg | ORAL_TABLET | Freq: Two times a day (BID) | ORAL | 3 refills | Status: AC
  Filled 2021-01-15: qty 60, 30d supply, fill #0

## 2021-01-15 NOTE — Telephone Encounter (Signed)
Called hospice referral to [facility].  They will reach out to the patient.

## 2021-01-16 ENCOUNTER — Telehealth: Payer: Self-pay | Admitting: Oncology

## 2021-01-16 ENCOUNTER — Encounter: Payer: Self-pay | Admitting: Hematology and Oncology

## 2021-01-16 DIAGNOSIS — R309 Painful micturition, unspecified: Secondary | ICD-10-CM | POA: Insufficient documentation

## 2021-01-16 DIAGNOSIS — Z7189 Other specified counseling: Secondary | ICD-10-CM | POA: Insufficient documentation

## 2021-01-16 NOTE — Assessment & Plan Note (Signed)
I have reviewed multiple imaging studies with the patient and her husband Unfortunately, she has disease progression After first dose of chemotherapy, she found significant benefit with complete resolution of her pain but throughout cycle 2 and 3, it appears that she started to progress through those treatment She is very weak and spent more than 12 hours lying down due to uncontrolled pain We reviewed the guidelines We discussed some of the standard options available including immunotherapy versus clinical trials Given the aggressive nature of her disease, I believe even with standard treatment, the expected response rate will be low as she has progressed on triple therapy After a lot of discussion, she is in agreement to stop palliative chemotherapy and focus on supportive care and quality of life She is in agreement to be referred for palliative care with hospice services

## 2021-01-16 NOTE — Assessment & Plan Note (Signed)
She has significant pain with urination I am hopeful that hospice service can place urinary catheter to alleviate pain if possible

## 2021-01-16 NOTE — Assessment & Plan Note (Signed)
We discussed prognosis, likely in less than 3 months without treatment We discussed the role of palliative care with hospice services We discussed CODE STATUS and she is in agreement for DNR and I gave her an order

## 2021-01-16 NOTE — Progress Notes (Signed)
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[place]) I have reviewed multiple imaging studies with the patient and her husband Unfortunately, she has disease progression After first dose of chemotherapy, she found significant benefit with complete resolution of her pain but throughout cycle 2 and 3, it appears that she started to progress through those treatment She is very weak and spent more than 12 hours lying down due to uncontrolled pain We reviewed the guidelines We discussed some of the standard options available including immunotherapy versus clinical trials Given the aggressive nature of her disease, I believe even with standard treatment, the expected response rate will be low as she has progressed on triple therapy After a lot of discussion, she is in agreement to stop palliative chemotherapy and focus on supportive care and quality of life She is in agreement to be referred for palliative care with hospice services  Cancer associated pain She has severe, uncontrolled pain We discussed the risk and benefits of hospitalization to get her pain under control versus escalation of home based treatment Ultimately, she is in agreement to try to increase MS Contin to 3 times a day and to continue to take oxycodone for breakthrough pain She will continue very low-dose dexamethasone as well  Urination pain She has significant pain with urination I am hopeful that hospice service can place urinary catheter to alleviate pain if possible  Goals of care, counseling/discussion We discussed prognosis,  likely in less than 3 months without treatment We discussed the role of palliative care with hospice services We discussed CODE STATUS and she is in agreement for DNR and I gave her an order  No orders of the defined types were placed in this encounter.   All questions were answered. The patient knows to call the clinic with any problems, questions or concerns. The total time spent in the appointment was 40 minutes encounter with patients including review of chart and various tests results, discussions about plan of care and coordination of care plan   Heath Lark, MD 01/16/2021 10:14 AM  INTERVAL HISTORY: Please see below for problem oriented charting. she returns for treatment follow-up with her husband to review test results She is getting weak She felt that the last cycle of treatment was not helpful She has poorly controlled pain She has significant difficulties with urination due to pain She has some mild constipation but alleviated by laxatives  REVIEW OF SYSTEMS:   Constitutional: Denies fevers, chills or abnormal weight loss Eyes: Denies blurriness of vision Ears, nose, mouth, throat, and face: Denies mucositis or sore throat Respiratory: Denies cough, dyspnea or wheezes Cardiovascular: Denies palpitation, chest discomfort or lower extremity swelling Gastrointestinal:  Denies nausea, heartburn or change in bowel habits Skin: Denies abnormal skin rashes Lymphatics: Denies new lymphadenopathy or easy bruising Neurological:Denies numbness, tingling or new weaknesses Behavioral/Psych: Mood is stable, no new changes  All other systems were reviewed with the patient and are negative.  I have reviewed the past medical history, past surgical history, social history and family history with the patient and they are unchanged from previous note.  ALLERGIES:  has No Known Allergies.  MEDICATIONS:  Current Outpatient Medications  Medication Sig Dispense Refill   bisacodyl  (DULCOLAX) 5 MG EC tablet Take 5 mg by mouth daily.     cetirizine (ZYRTEC) 10 MG tablet TAKE 1 TO 2 TABLETS BY MOUTH DAILY AS NEEDED 60 tablet 6   Cholecalciferol (VITAMIN D) 50 MCG (2000 UT) tablet Take 2,000 Units by mouth daily.     dexamethasone (DECADRON) 4 MG tablet Take 1 tablet (4 mg total) by mouth 2 (two) times daily. 60 tablet 3   Emollient (AQUAPHOR EX) Apply 1 application topically daily.     gabapentin (NEURONTIN) 300 MG capsule Take 1 capsule (300 mg total) by mouth at bedtime. 30 capsule 3   lactulose (CHRONULAC) 10 GM/15ML solution Take 30 mLs (20 g total) by mouth 3 (three) times daily. 473 mL 3   lidocaine (XYLOCAINE) 5 % ointment Apply 1 application topically as needed for mild pain or moderate pain. 35.44 g 1   lidocaine-prilocaine (EMLA) cream Apply to affected area once 30 g 3   magnesium oxide (MAG-OX) 400 (240 Mg) MG tablet Take 1 tablet (400 mg total) by mouth daily. 30 tablet 1   metroNIDAZOLE (FLAGYL) 250 MG tablet Take 1 tablet (250 mg total) by mouth 2 (two) times daily. 20 tablet 0   morphine (MS CONTIN) 30 MG 12 hr tablet Take 1 tablet (30 mg total) by mouth 3 (three) times daily. 90 tablet 0   ondansetron (ZOFRAN) 8 MG tablet Take 1 tablet (8 mg total) by mouth every 8 (eight) hours as needed. Start on the third day after cisplatin chemotherapy. 30 tablet 1   oxybutynin (DITROPAN XL) 15 MG 24 hr tablet Take 15 mg by mouth daily.     oxycodone (ROXICODONE) 30 MG immediate release tablet Take 1 tablet (30 mg total) by mouth every 3 (three) hours as needed. 90 tablet 0   polyethylene glycol (MIRALAX / GLYCOLAX) 17 g packet Take 17 g by mouth daily.     Probiotic Product (PROBIOTIC PO) Take 1 capsule by mouth daily.     prochlorperazine (COMPAZINE) 10 MG tablet Take 1 tablet (10 mg total) by mouth every 6 (six) hours as needed (Nausea or vomiting). 30 tablet 1   No current facility-administered medications for this visit.    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 VULVA, BIOPSY:  -  Squamous cell carcinoma   06/16/2020 Initial Diagnosis   The patient began experiencing vulvar burning after her knee replacement in March 2022.  She mentioned this to her primary care physician who looked at it and was unclear about the diagnosis but recommended consultation with a gynecologist.  The patient did not have an established gynecologist and therefore made a new patient consultation with Dr. Dellis Filbert.  Dr. Dellis Filbert saw the patient on 06/16/2020 which revealed a squamous cell carcinoma of the vulva.  The tumor was well differentiated and keratinizing.  Her primary vulvar cancer was not felt to be amenable to primary resection as it was a 6cm lesion replacing the perineal body and adherent with the anal sphincter.     06/30/2020 Imaging   Ct chest, abdomen and pelvis  1. No evidence of distant metastatic disease in the chest, abdomen or pelvis. 2. The patient's known cervical cancer is not well visualized. No gross bladder or anorectal involvement. No pelvic adenopathy. 3. Tiny right middle lobe nodule, likely benign. Attention on follow-up.  4.  Aortic Atherosclerosis (ICD10-I70.0).   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   Surgeons:  Donaciano Eva, MD   Pathology: right and left inguinofemoral lymph nodes.   Operative findings: clinically suspicious medial left inguinal lymph node. 6cm perineal vulvar tumor.     07/11/2020 Pathology Results   A. LYMPH NODE, RIGHT INGUINAL FEMORAL, RESECTION:  - Metastatic keratinizing squamous cell carcinoma involving one of three lymph nodes (1/3)  - No evidence of extranodal extension   B. LYMPH NODE, LEFT INGUINAL FEMORAL, RESECTION:  - Metastatic keratinizing squamous cell carcinoma  involving two of four lymph nodes (2/4)  - No evidence of extranodal extension    08/03/2020 - 10/02/2020 Radiation Therapy   Radiation Treatment Dates: 08/03/2020 through 10/02/2020 Site Technique Total Dose (Gy) Dose per Fx (Gy) Completed Fx Beam Energies  Vulva: Pelvis IMRT 50.4/50.4 1.8 28/28 6X  Vulva: Pelvis_Bst IMRT 12.6/12.6 1.8 7/7 6X  Vulva: Pelvis_Bst_2 IMRT 10/10 2 5/5 6X      Cumulative dose to the vulvar region was 63.0 Gy.  Cumulative dose to the pelvis 50.4 Gy.  The inguinal areas were boosted to a cumulative dose of 60.4 Gy.     08/08/2020 PET scan   1. Intense activity midline along the perineum/external genitalia with differential of FDG urine contamination versus malignancy of the external genitalia. 2. Bilateral mildly enlarged intensely hypermetabolic inguinal lymph nodes. Biopsy clips adjacent to these lymph nodes. Findings concerning for metastatic inguinal adenopathy(large benign-appearing fluid collection in the LEFT groin is presumably a post surgical seroma). 3. No evidence of metastatic pelvic iliac lymphadenopathy or retroperitoneal periaortic metastatic adenopathy. 4. No evidence of visceral metastasis. 5. No skeletal metastasis.     08/15/2020 Imaging   Vascular US RIGHT:  - No evidence of common femoral vein obstruction.     LEFT:  - There is no evidence of deep vein thrombosis in the lower extremity.     - No cystic structure found in the popliteal fossa.  - Ultrasound characteristics of several enlarged lymph nodes, and a large anechoic area, are noted in the groin.    11/03/2020 PET scan   1. No significant PET-CT findings for regression of tumor or adenopathy. 2. No new chest, abdominal or osseous metastatic disease.     11/08/2020 Pathology Results   A. VULVA, LEFT POSTERIOR, BIOPSY:  - Invasive keratinizing squamous cell carcinoma.   11/13/2020 Cancer Staging   Staging form: Vulva, AJCC 8th Edition - Clinical stage from 11/13/2020: Stage IVA  (cT2, cN3, cM0) - Signed by Heath Lark, MD on 11/13/2020 Stage prefix: Initial diagnosis    11/20/2020 Procedure   Placement of a subcutaneous power-injectable port device. Catheter tip in the lower SVC   11/22/2020 - 01/03/2021 Chemotherapy   Patient is on Treatment Plan : Vulvar cancer Cisplatin + Paclitaxel q21d plus bevacizumab      01/10/2021 Imaging   1. Interval progression of disease. There has been interval increase in size of bilateral pelvic sidewall and bilateral inguinal lymph nodes. 2. Enhancing mass involving the posterior vulva extending into the anorectal region is identified compatible with residual tumor. 3. Moderate stool burden identified throughout the colon. Correlate for any clinical signs or symptoms of constipation. 4. Aortic Atherosclerosis (ICD10-I70.0).     PHYSICAL EXAMINATION: ECOG PERFORMANCE STATUS: 3 - Symptomatic, >50% confined to bed  Vitals:   01/15/21 1310  BP: (!) 151/97  Pulse: (!) 108  Resp: 18  Temp: 99.5 F (37.5 C)  SpO2: 99%   Filed Weights   01/15/21 1310  Weight: 115 lb 12.8 oz (52.5 kg)    GENERAL:alert, in intermittent distress from pain NEURO: alert & oriented x 3 with fluent speech, no focal motor/sensory deficits  LABORATORY DATA:  I have reviewed the data as listed    Component Value Date/Time   NA 136 01/01/2021 0905   NA 142 02/18/2015 0000   K 4.3 01/01/2021 0905   CL 100 01/01/2021 0905   CO2 26 01/01/2021 0905   GLUCOSE 127 (H) 01/01/2021 0905   BUN 19 01/01/2021 0905   BUN 13 02/18/2015 0000   CREATININE 0.79 01/01/2021 0905   CALCIUM 9.8 01/01/2021 0905   PROT 7.4 01/01/2021 0905   ALBUMIN 3.6 01/01/2021 0905   AST 19 01/01/2021 0905   ALT 25 01/01/2021 0905   ALKPHOS 67 01/01/2021 0905   BILITOT 0.6 01/01/2021 0905   GFRNONAA >60 01/01/2021 0905    No results found for: SPEP, UPEP  Lab Results  Component Value Date   WBC 20.5 (H) 01/01/2021   NEUTROABS 18.5 (H) 01/01/2021   HGB 12.1  01/01/2021   HCT 36.3 01/01/2021   MCV 88.3 01/01/2021   PLT 414 (H) 01/01/2021      Chemistry      Component Value Date/Time   NA 136 01/01/2021 0905   NA 142 02/18/2015 0000   K 4.3 01/01/2021 0905   CL 100 01/01/2021 0905   CO2 26 01/01/2021 0905   BUN 19 01/01/2021 0905   BUN 13 02/18/2015 0000   CREATININE 0.79 01/01/2021 0905   GLU 95 02/18/2015 0000      Component Value Date/Time   CALCIUM 9.8 01/01/2021 0905   ALKPHOS 67 01/01/2021 0905   AST 19 01/01/2021 0905   ALT 25 01/01/2021 0905   BILITOT 0.6 01/01/2021 0905       RADIOGRAPHIC STUDIES: I have reviewed imaging studies with the patient I have personally reviewed the radiological images as listed and agreed with the findings in the report. CT ABDOMEN PELVIS W CONTRAST  Result Date: 01/10/2021 CLINICAL DATA:  Restaging vulvar carcinoma EXAM: CT ABDOMEN AND PELVIS WITH CONTRAST TECHNIQUE: Multidetector CT imaging of the abdomen and pelvis was performed using the standard protocol following bolus administration of intravenous contrast. CONTRAST:  28mL OMNIPAQUE IOHEXOL 350 MG/ML SOLN COMPARISON:  11/03/2020 FINDINGS: Lower chest: No acute findings. Hepatobiliary: No suspicious liver abnormality. Status post cholecystectomy. Increase caliber the common bile measures to 1.3 cm versus 1.1 cm previously. No signs of mass or adenopathy. Pancreas: Unremarkable. No pancreatic ductal dilatation or surrounding inflammatory changes. Spleen: Normal in size without focal abnormality. Adrenals/Urinary Tract: Normal adrenal glands. No kidney mass, nephrolithiasis or hydronephrosis. Urinary bladder appears normal. Stomach/Bowel: Stomach is within normal limits. Appendix appears normal. No evidence of bowel wall thickening, distention, or inflammatory changes. Moderate stool burden is identified throughout the colon. Vascular/Lymphatic: Mild aortic atherosclerosis without aneurysm. Right pelvic sidewall lymph node measures 1.6 x 1.8 cm,  image 72/2. This is compared with approximately 1.0 x 0.8 cm on most recent PET-CT. Left pelvic sidewall lymph node conglomeration measures 2.8 x 2.0 cm, image 72/2. This is compared with 1.1 x 1.1 cm on most recent PET-CT. Right external iliac node measures 1.3 cm, image 74/2. Previously 1 cm. Left inguinal lymph node measures 1.6 cm, image 81/2. Formally 1.2 cm. Previously 1.2 cm. Postoperative changes from left inguinal node dissection identified. Fluid density structure within  the left inguinal region with adjacent dissection clips measures 4.2 x 1.9 cm and is compatible with postop seroma. Reproductive: The uterus and adnexal structures are unremarkable. Enhancing mass involving the posterior vulva extending into the anorectal region measures 5.4 x 4.0 by 5.2 cm, image 91/2 and image 59/6. This corresponds to the area of abnormal hypermetabolism on recent PET-CT, which on the recent PET-CT is difficult to measure anatomically due to lack of IV contrast material no ascites. Other: No abdominal wall hernia or abnormality. No abdominopelvic ascites. Musculoskeletal: No acute or significant osseous findings. IMPRESSION: 1. Interval progression of disease. There has been interval increase in size of bilateral pelvic sidewall and bilateral inguinal lymph nodes. 2. Enhancing mass involving the posterior vulva extending into the anorectal region is identified compatible with residual tumor. 3. Moderate stool burden identified throughout the colon. Correlate for any clinical signs or symptoms of constipation. 4. Aortic Atherosclerosis (ICD10-I70.0). Electronically Signed   By: Kerby Moors M.D.   On: 01/10/2021 07:11

## 2021-01-16 NOTE — Assessment & Plan Note (Signed)
She has severe, uncontrolled pain We discussed the risk and benefits of hospitalization to get her pain under control versus escalation of home based treatment Ultimately, she is in agreement to try to increase MS Contin to 3 times a day and to continue to take oxycodone for breakthrough pain She will continue very low-dose dexamethasone as well

## 2021-01-16 NOTE — Telephone Encounter (Signed)
Christina [name] called and wanted to review her pain medications. She was overwhelmed yesterday and wants to make sure she is taking them correctly. Went over instructions for oxycodone 30 mg 1 tablet q 3 hours prn and Morphine 30 mg 1 tablet 3 times daily.  She verbalized understanding.  Dalonda Simoni also said [facility] has reached out to her but she has not called them back yet.  She said she is working on some family issues and will call this afternoon or tomorrow.

## 2021-01-19 ENCOUNTER — Encounter: Payer: Self-pay | Admitting: Hematology and Oncology

## 2021-03-01 ENCOUNTER — Telehealth: Payer: Self-pay

## 2021-03-21 NOTE — Telephone Encounter (Signed)
Social worker called and left a message. Christina [name] passed away today at [facility] [address]

## 2021-03-21 DEATH — deceased

## 2022-10-29 IMAGING — MR MR LUMBAR SPINE W/O CM
4 of 5 series · 25 of 48 positions shown · non-contrast
Comparison: 03/17/2018 and prior.

CLINICAL DATA: MR lumbar spine-eval for source of worseneing low
back pain

EXAM:
MRI LUMBAR SPINE WITHOUT CONTRAST
TECHNIQUE: Multiplanar, multisequence MR imaging of the lumbar spine was
performed. No intravenous contrast was administered.

[Series 3: T2 · sagittal · 4.0mm · 0.53mm/px · 7 of 15 slices shown (1 of 2)]
[im 1/15]
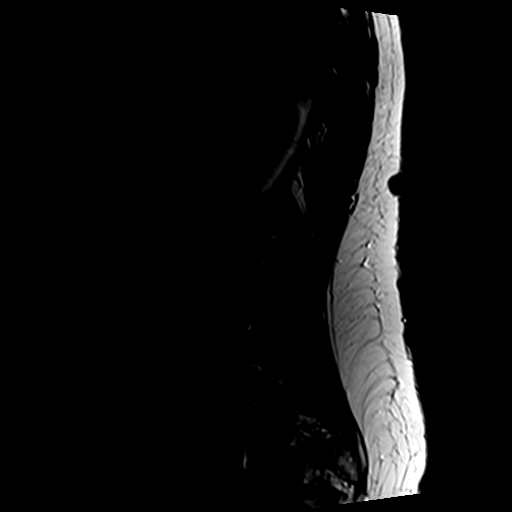
[im 3/15]
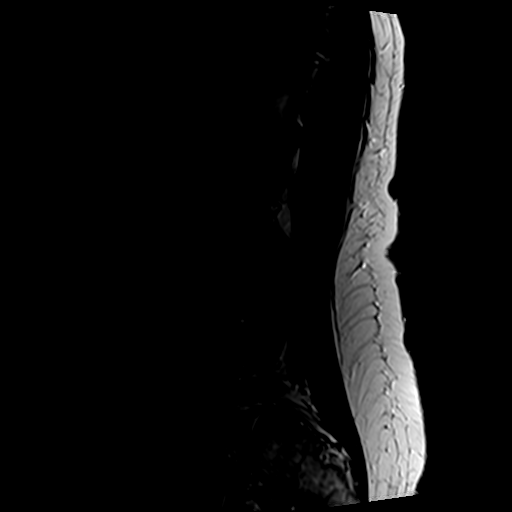
[im 5/15]
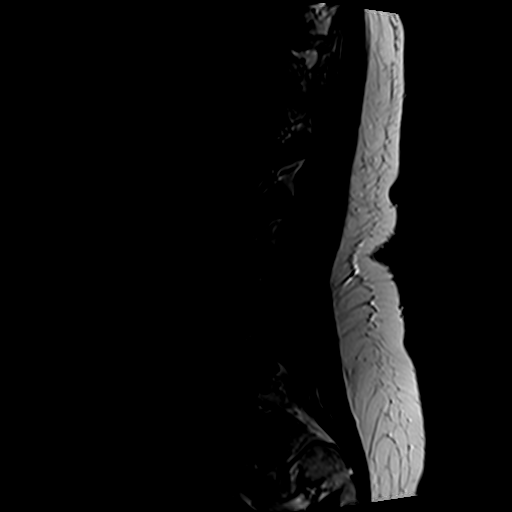
[im 8/15]
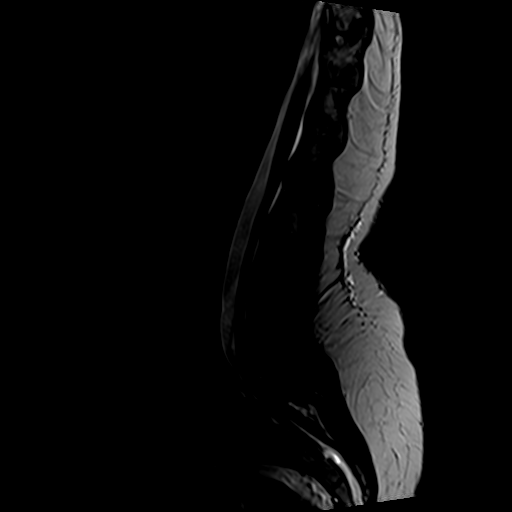
[im 10/15]
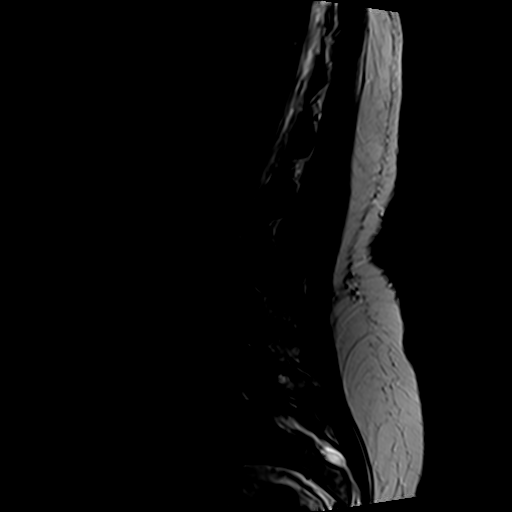
[im 12/15]
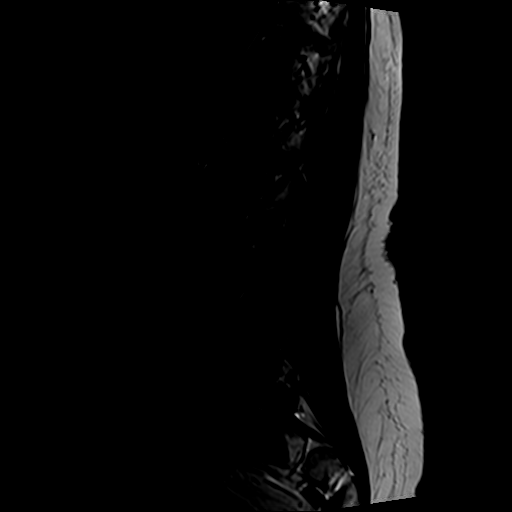
[im 15/15]
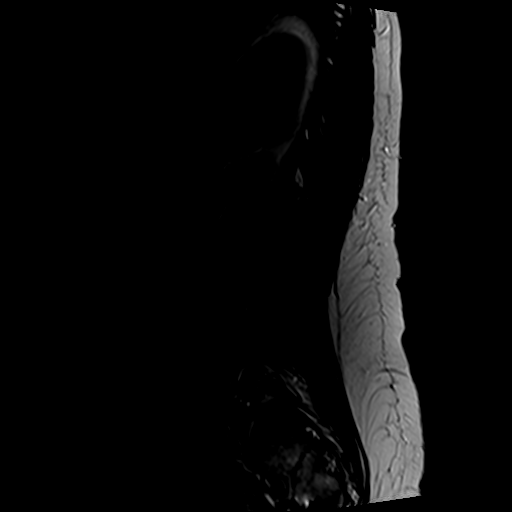

[Series 5: T1 · sagittal · 4.0mm · 0.53mm/px · 6 of 15 slices shown (1 of 2)]
[im 1/15]
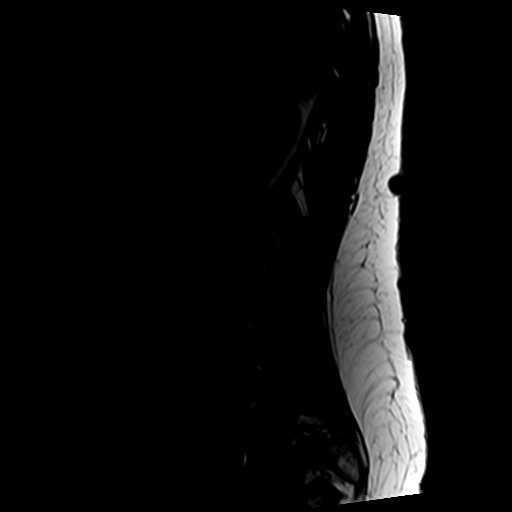
[im 3/15]
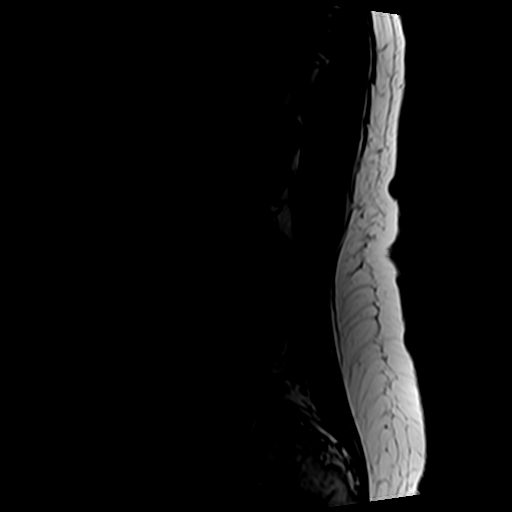
[im 6/15]
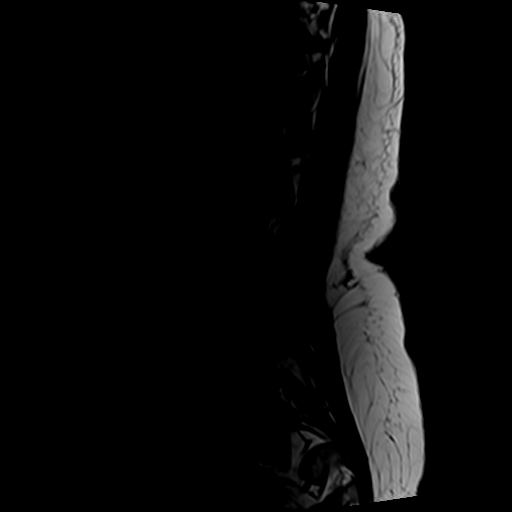
[im 9/15]
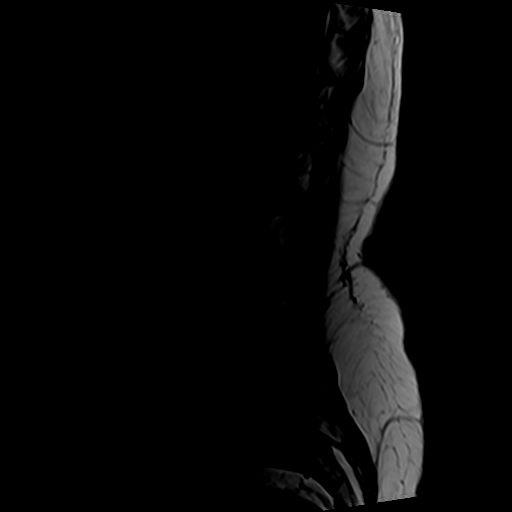
[im 12/15]
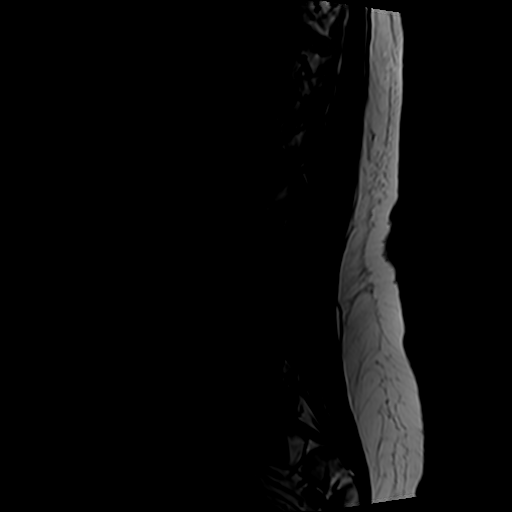
[im 15/15]
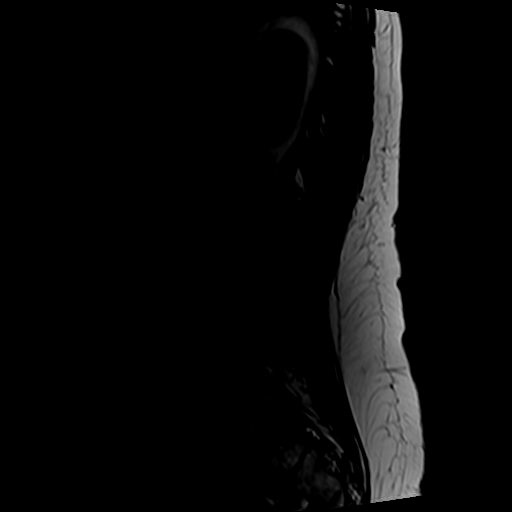

[Series 6: T2 · axial · 4.0mm · 0.70mm/px · z∈[-137,+78]mm · 8 of 33 slices shown (2 of 2)]
[im 1/33]
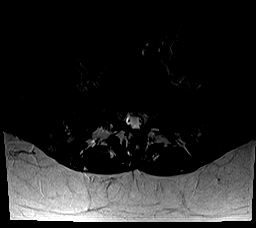
[im 5/33]
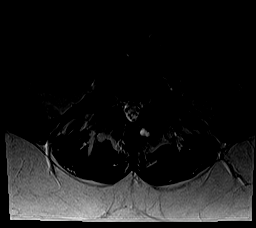
[im 10/33]
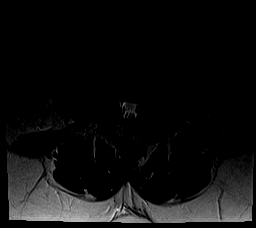
[im 15/33]
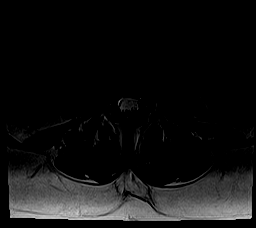
[im 18/33]
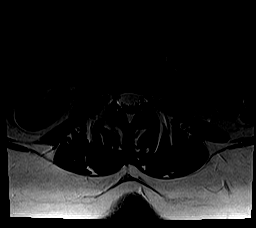
[im 23/33]
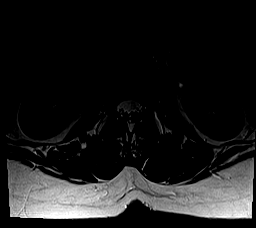
[im 28/33]
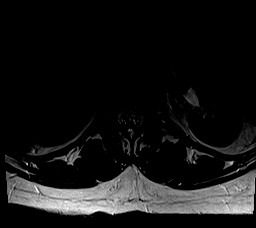
[im 33/33]
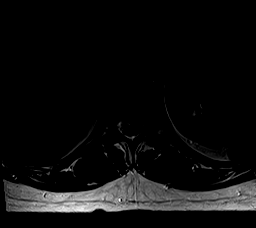

[Series 7: T1 · axial · 4.0mm · 0.35mm/px · z∈[-137,+52]mm · 4 of 33 slices shown (2 of 2)]
[im 1/33]
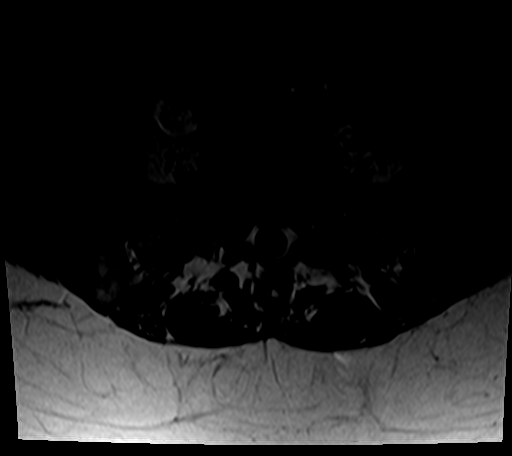
[im 5/33]
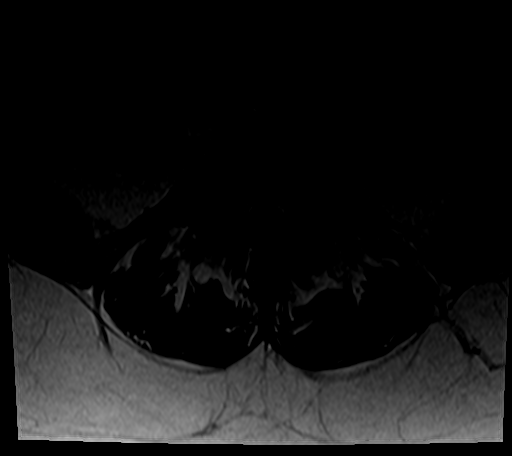
[im 18/33]
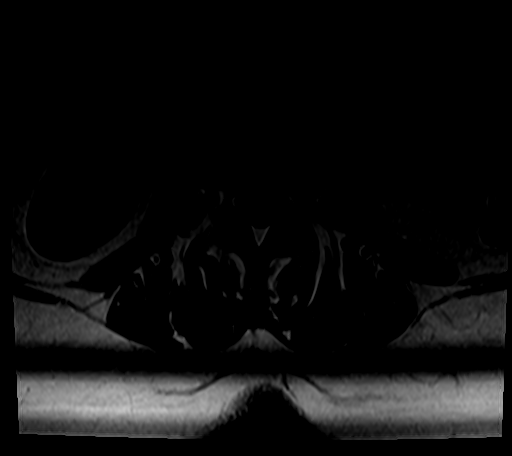
[im 28/33]
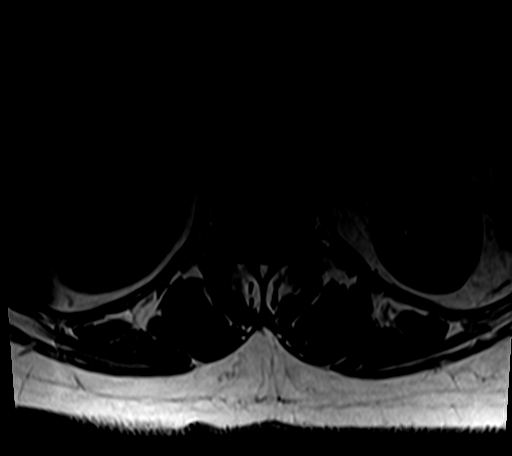

[25 of 48 positions shown; findings below may reference images not displayed]

FINDINGS: Segmentation:  Standard.

Alignment:  Stepwise grade 1 anterolisthesis at L4-5, L5-S1 level.

Vertebrae: Minimal Modic type 2 endplate degenerative changes. No
fracture or aggressive osseous lesion.

Conus medullaris and cauda equina: Conus extends to the L1 level.
Conus and cauda equina appear normal.

Disc levels: Multilevel desiccation.

L1-2: No significant disc bulge. Patent spinal canal and neural
foramen.

L2-3: No significant disc bulge. Patent spinal canal and neural
foramen.

L3-4: Mild disc bulge.  Patent spinal canal and neural foramen.

L4-5: Grade 1 anterolisthesis with uncovered bulge grazing the
descending left L5 nerve root. Shallow left foraminal protrusion.
Bilateral facet hypertrophy. Mild spinal canal and bilateral neural
foraminal narrowing.

L5-S1: Disc bulge and left predominant facet hypertrophy. Patent
spinal canal. Mild bilateral neural foraminal narrowing.

Paraspinal and other soft tissues: Negative.
IMPRESSION: L4-5 bulge abutting the descending left L5 nerve root with mild
spinal canal and bilateral neural foraminal narrowing, grossly
unchanged.

Mild bilateral L5-S1 neural foraminal narrowing.
